# Patient Record
Sex: Female | Born: 1945
Health system: Southern US, Community
[De-identification: ages and names within clinical notes are randomized; demographics above are authoritative.]

## PROBLEM LIST (undated history)

## (undated) DIAGNOSIS — I10 Essential (primary) hypertension: Secondary | ICD-10-CM

## (undated) DIAGNOSIS — K219 Gastro-esophageal reflux disease without esophagitis: Secondary | ICD-10-CM

## (undated) DIAGNOSIS — Z8719 Personal history of other diseases of the digestive system: Secondary | ICD-10-CM

## (undated) DIAGNOSIS — I2510 Atherosclerotic heart disease of native coronary artery without angina pectoris: Secondary | ICD-10-CM

## (undated) DIAGNOSIS — D509 Iron deficiency anemia, unspecified: Principal | ICD-10-CM

## (undated) DIAGNOSIS — M1990 Unspecified osteoarthritis, unspecified site: Secondary | ICD-10-CM

## (undated) DIAGNOSIS — S72001A Fracture of unspecified part of neck of right femur, initial encounter for closed fracture: Secondary | ICD-10-CM

## (undated) DIAGNOSIS — D699 Hemorrhagic condition, unspecified: Secondary | ICD-10-CM

## (undated) DIAGNOSIS — A044 Other intestinal Escherichia coli infections: Secondary | ICD-10-CM

## (undated) DIAGNOSIS — I219 Acute myocardial infarction, unspecified: Secondary | ICD-10-CM

## (undated) DIAGNOSIS — M5414 Radiculopathy, thoracic region: Secondary | ICD-10-CM

## (undated) DIAGNOSIS — S0990XA Unspecified injury of head, initial encounter: Secondary | ICD-10-CM

## (undated) DIAGNOSIS — D649 Anemia, unspecified: Secondary | ICD-10-CM

## (undated) DIAGNOSIS — N2889 Other specified disorders of kidney and ureter: Secondary | ICD-10-CM

## (undated) HISTORY — DX: Iron deficiency anemia, unspecified: D50.9

## (undated) HISTORY — PX: BREAST BIOPSY: SHX20

## (undated) HISTORY — DX: Other specified disorders of kidney and ureter: N28.89

## (undated) HISTORY — PX: TONSILLECTOMY: SUR1361

## (undated) HISTORY — PX: REDUCTION MAMMAPLASTY: SUR839

## (undated) HISTORY — PX: COLONOSCOPY: SHX174

## (undated) HISTORY — DX: Hemorrhagic condition, unspecified: D69.9

## (undated) HISTORY — PX: ABDOMINAL HYSTERECTOMY: SHX81

## (undated) HISTORY — DX: Radiculopathy, thoracic region: M54.14

## (undated) HISTORY — PX: BREAST LUMPECTOMY: SHX2

## (undated) HISTORY — PX: URETHRA SURGERY: SHX824

## (undated) HISTORY — DX: Acute myocardial infarction, unspecified: I21.9

---

## 1979-05-12 HISTORY — PX: BREAST SURGERY: SHX581

## 1979-05-12 HISTORY — PX: BREAST REDUCTION SURGERY: SHX8

## 1998-05-11 HISTORY — PX: FACIAL FRACTURE SURGERY: SHX1570

## 2000-10-28 ENCOUNTER — Encounter: Payer: Self-pay | Admitting: Cardiovascular Disease

## 2000-10-28 ENCOUNTER — Ambulatory Visit (HOSPITAL_COMMUNITY): Admission: RE | Admit: 2000-10-28 | Discharge: 2000-10-28 | Payer: Self-pay | Admitting: Cardiovascular Disease

## 2004-03-21 ENCOUNTER — Ambulatory Visit: Payer: Self-pay | Admitting: Internal Medicine

## 2006-04-21 ENCOUNTER — Emergency Department (HOSPITAL_COMMUNITY): Admission: EM | Admit: 2006-04-21 | Discharge: 2006-04-21 | Payer: Self-pay | Admitting: Emergency Medicine

## 2010-11-23 ENCOUNTER — Emergency Department (HOSPITAL_COMMUNITY)
Admission: EM | Admit: 2010-11-23 | Discharge: 2010-11-23 | Disposition: A | Discharge status: Home / Self Care | Payer: Medicare Other | Attending: Emergency Medicine | Admitting: Emergency Medicine

## 2010-11-23 ENCOUNTER — Emergency Department (HOSPITAL_COMMUNITY): Payer: Medicare Other

## 2010-11-23 DIAGNOSIS — K449 Diaphragmatic hernia without obstruction or gangrene: Secondary | ICD-10-CM | POA: Insufficient documentation

## 2010-11-23 DIAGNOSIS — I10 Essential (primary) hypertension: Secondary | ICD-10-CM | POA: Insufficient documentation

## 2010-11-23 DIAGNOSIS — R42 Dizziness and giddiness: Secondary | ICD-10-CM | POA: Insufficient documentation

## 2010-11-23 LAB — CBC
HCT: 36.3 % (ref 36.0–46.0)
Hemoglobin: 12 g/dL (ref 12.0–15.0)
MCH: 27.3 pg (ref 26.0–34.0)
MCHC: 33.1 g/dL (ref 30.0–36.0)
MCV: 82.5 fL (ref 78.0–100.0)
Platelets: 281 10*3/uL (ref 150–400)
RBC: 4.4 MIL/uL (ref 3.87–5.11)
RDW: 13.7 % (ref 11.5–15.5)
WBC: 8.5 10*3/uL (ref 4.0–10.5)

## 2010-11-23 LAB — CK TOTAL AND CKMB (NOT AT ARMC)
CK, MB: 1.4 ng/mL (ref 0.3–4.0)
CK, MB: 1.2 ng/mL (ref 0.3–4.0)
Relative Index: INVALID (ref 0.0–2.5)
Relative Index: INVALID (ref 0.0–2.5)
Total CK: 40 U/L (ref 7–177)

## 2010-11-23 LAB — COMPREHENSIVE METABOLIC PANEL
ALT: 12 U/L (ref 0–35)
AST: 15 U/L (ref 0–37)
Calcium: 8.8 mg/dL (ref 8.4–10.5)
Creatinine, Ser: 0.52 mg/dL (ref 0.50–1.10)
Sodium: 139 mEq/L (ref 135–145)
Total Protein: 6.8 g/dL (ref 6.0–8.3)

## 2010-11-23 LAB — TROPONIN I
Troponin I: <0.3 ng/mL (ref <0.30)
Troponin I: <0.3 ng/mL (ref <0.30)

## 2011-05-19 ENCOUNTER — Telehealth (INDEPENDENT_AMBULATORY_CARE_PROVIDER_SITE_OTHER): Payer: Self-pay

## 2011-05-19 NOTE — Telephone Encounter (Signed)
error 

## 2011-07-23 DIAGNOSIS — I10 Essential (primary) hypertension: Secondary | ICD-10-CM | POA: Diagnosis not present

## 2011-07-23 DIAGNOSIS — E782 Mixed hyperlipidemia: Secondary | ICD-10-CM | POA: Diagnosis not present

## 2011-07-23 DIAGNOSIS — E119 Type 2 diabetes mellitus without complications: Secondary | ICD-10-CM | POA: Diagnosis not present

## 2011-07-23 DIAGNOSIS — N951 Menopausal and female climacteric states: Secondary | ICD-10-CM | POA: Diagnosis not present

## 2011-07-23 DIAGNOSIS — E039 Hypothyroidism, unspecified: Secondary | ICD-10-CM | POA: Diagnosis not present

## 2011-07-27 DIAGNOSIS — E119 Type 2 diabetes mellitus without complications: Secondary | ICD-10-CM | POA: Diagnosis not present

## 2011-07-27 DIAGNOSIS — D509 Iron deficiency anemia, unspecified: Secondary | ICD-10-CM | POA: Diagnosis not present

## 2011-07-27 DIAGNOSIS — R1032 Left lower quadrant pain: Secondary | ICD-10-CM | POA: Diagnosis not present

## 2011-07-27 DIAGNOSIS — R21 Rash and other nonspecific skin eruption: Secondary | ICD-10-CM | POA: Diagnosis not present

## 2011-08-03 DIAGNOSIS — R1032 Left lower quadrant pain: Secondary | ICD-10-CM | POA: Diagnosis not present

## 2011-08-05 DIAGNOSIS — D509 Iron deficiency anemia, unspecified: Secondary | ICD-10-CM | POA: Diagnosis not present

## 2011-08-05 DIAGNOSIS — R1032 Left lower quadrant pain: Secondary | ICD-10-CM | POA: Diagnosis not present

## 2011-08-05 DIAGNOSIS — R21 Rash and other nonspecific skin eruption: Secondary | ICD-10-CM | POA: Diagnosis not present

## 2011-08-19 DIAGNOSIS — R21 Rash and other nonspecific skin eruption: Secondary | ICD-10-CM | POA: Diagnosis not present

## 2011-08-19 DIAGNOSIS — T07XXXA Unspecified multiple injuries, initial encounter: Secondary | ICD-10-CM | POA: Diagnosis not present

## 2011-08-19 DIAGNOSIS — L2989 Other pruritus: Secondary | ICD-10-CM | POA: Diagnosis not present

## 2011-08-19 DIAGNOSIS — L298 Other pruritus: Secondary | ICD-10-CM | POA: Diagnosis not present

## 2011-08-19 DIAGNOSIS — L259 Unspecified contact dermatitis, unspecified cause: Secondary | ICD-10-CM | POA: Diagnosis not present

## 2011-08-19 DIAGNOSIS — L280 Lichen simplex chronicus: Secondary | ICD-10-CM | POA: Diagnosis not present

## 2011-10-09 DIAGNOSIS — F329 Major depressive disorder, single episode, unspecified: Secondary | ICD-10-CM | POA: Diagnosis not present

## 2011-10-09 DIAGNOSIS — D539 Nutritional anemia, unspecified: Secondary | ICD-10-CM | POA: Diagnosis not present

## 2011-10-09 DIAGNOSIS — D649 Anemia, unspecified: Secondary | ICD-10-CM | POA: Diagnosis not present

## 2011-10-19 ENCOUNTER — Encounter (HOSPITAL_COMMUNITY): Payer: Self-pay | Admitting: *Deleted

## 2011-10-19 ENCOUNTER — Emergency Department (HOSPITAL_COMMUNITY)
Admission: EM | Admit: 2011-10-19 | Discharge: 2011-10-19 | Disposition: A | Discharge status: Home / Self Care | Payer: Medicare Other | Attending: Emergency Medicine | Admitting: Emergency Medicine

## 2011-10-19 DIAGNOSIS — R21 Rash and other nonspecific skin eruption: Secondary | ICD-10-CM | POA: Diagnosis not present

## 2011-10-19 DIAGNOSIS — I2510 Atherosclerotic heart disease of native coronary artery without angina pectoris: Secondary | ICD-10-CM | POA: Insufficient documentation

## 2011-10-19 DIAGNOSIS — Z79899 Other long term (current) drug therapy: Secondary | ICD-10-CM | POA: Diagnosis not present

## 2011-10-19 DIAGNOSIS — D649 Anemia, unspecified: Secondary | ICD-10-CM | POA: Insufficient documentation

## 2011-10-19 DIAGNOSIS — I10 Essential (primary) hypertension: Secondary | ICD-10-CM | POA: Insufficient documentation

## 2011-10-19 DIAGNOSIS — K921 Melena: Secondary | ICD-10-CM

## 2011-10-19 HISTORY — DX: Unspecified injury of head, initial encounter: S09.90XA

## 2011-10-19 HISTORY — DX: Anemia, unspecified: D64.9

## 2011-10-19 HISTORY — DX: Essential (primary) hypertension: I10

## 2011-10-19 HISTORY — DX: Other intestinal Escherichia coli infections: A04.4

## 2011-10-19 HISTORY — DX: Atherosclerotic heart disease of native coronary artery without angina pectoris: I25.10

## 2011-10-19 LAB — DIFFERENTIAL
Basophils Relative: 0 % (ref 0–1)
Eosinophils Absolute: 0.1 10*3/uL (ref 0.0–0.7)
Eosinophils Relative: 2 % (ref 0–5)
Lymphocytes Relative: 34 % (ref 12–46)
Monocytes Absolute: 0.5 10*3/uL (ref 0.1–1.0)
Neutrophils Relative %: 57 % (ref 43–77)

## 2011-10-19 LAB — COMPREHENSIVE METABOLIC PANEL
ALT: 7 U/L (ref 0–35)
AST: 17 U/L (ref 0–37)
Alkaline Phosphatase: 81 U/L (ref 39–117)
Calcium: 8.9 mg/dL (ref 8.4–10.5)
Glucose, Bld: 125 mg/dL — ABNORMAL HIGH (ref 70–99)
Potassium: 3.6 mEq/L (ref 3.5–5.1)
Sodium: 136 mEq/L (ref 135–145)
Total Protein: 6.5 g/dL (ref 6.0–8.3)

## 2011-10-19 LAB — CBC
Hemoglobin: 8.8 g/dL — ABNORMAL LOW (ref 12.0–15.0)
MCH: 21.6 pg — ABNORMAL LOW (ref 26.0–34.0)
MCHC: 29.1 g/dL — ABNORMAL LOW (ref 30.0–36.0)
Platelets: 150 10*3/uL (ref 150–400)
RBC: 4.08 MIL/uL (ref 3.87–5.11)

## 2011-10-19 LAB — TYPE AND SCREEN

## 2011-10-19 LAB — OCCULT BLOOD, POC DEVICE: Fecal Occult Bld: NEGATIVE

## 2011-10-19 LAB — ABO/RH: ABO/RH(D): A POS

## 2011-10-19 MED ORDER — TRIAMCINOLONE ACETONIDE 0.1 % EX CREA: TOPICAL_CREAM | Freq: Two times a day (BID) | CUTANEOUS | Status: AC

## 2011-10-19 NOTE — ED Notes (Signed)
Pt states has a low hgb since April approx 8; down to 6 now; being seen for same; states has no energy; cold all the time; worse today; today at 4 am developed severe stomach cramps; had black "oil" like stool; repeat black stools at least 5 times; sent over by [facility]; dizzy at times; no abd pain now; nausea

## 2011-10-19 NOTE — Discharge Instructions (Signed)
Anemia, Frequently Asked Questions WHAT ARE THE SYMPTOMS OF ANEMIA?  Headache.   Difficulty thinking.   Fatigue.   Shortness of breath.   Weakness.   Rapid heartbeat.  AT WHAT POINT ARE PEOPLE CONSIDERED ANEMIC?  This varies with gender and age.   Both hemoglobin (Hgb) and hematocrit values are used to define anemia. These lab values are obtained from a complete blood count (CBC) test. This is performed at a caregiver's office.   The normal range of hemoglobin values for adult men is 14.0 g/dL to 17.4 g/dL. For nonpregnant women, values are 12.3 g/dL to 15.3 g/dL.   The World Health Organization defines anemia as less than 12 g/dL for nonpregnant women and less than 13 g/dL for men.   For adult males, the average normal hematocrit is 46%, and the range is 40% to 52%.   For adult females, the average normal hematocrit is 41%, and the range is 35% to 47%.   Values that fall below the lower limits can be a sign of anemia and should have further checking (evaluation).  GROUPS OF PEOPLE WHO ARE AT RISK FOR DEVELOPING ANEMIA INCLUDE:   Infants who are breastfed or taking a formula that is not fortified with iron.   Children going through a rapid growth spurt. The iron available can not keep up with the needs for a red cell mass which must grow with the child.   Women in childbearing years. They need iron because of blood loss during menstruation.   Pregnant women. The growing fetus creates a high demand for iron.   People with ongoing gastrointestinal blood loss are at risk of developing iron deficiency.   Individuals with leukemia or cancer who must receive chemotherapy or radiation to treat their disease. The drugs or radiation used to treat these diseases often decreases the bone marrow's ability to make cells of all classes. This includes red blood cells, white blood cells, and platelets.   Individuals with chronic inflammatory conditions such as rheumatoid arthritis or  chronic infections.   The elderly.  ARE SOME TYPES OF ANEMIA INHERITED?   Yes, some types of anemia are due to inherited or genetic defects.   Sickle cell anemia. This occurs most often in people of African, African American, and Mediterranean descent.   Thalassemia (or Cooley's anemia). This type is found in people of Mediterranean and Southeast Asian descent. These types of anemia are common.   [place]i. This is rare.  CAN CERTAIN MEDICATIONS CAUSE A PERSON TO BECOME ANEMIC?  Yes. For example, drugs to fight cancer (chemotherapeutic agents) often cause anemia. These drugs can slow the bone marrow's ability to make red blood cells. If there are not enough red blood cells, the body does not get enough oxygen. WHAT HEMATOCRIT LEVEL IS REQUIRED TO DONATE BLOOD?  The lower limit of an acceptable hematocrit for blood donors is 38%. If you have a low hematocrit value, you should schedule an appointment with your caregiver. ARE BLOOD TRANSFUSIONS COMMONLY USED TO CORRECT ANEMIA, AND ARE THEY DANGEROUS?  They are used to treat anemia as a last resort. Your caregiver will find the cause of the anemia and correct it if possible. Most blood transfusions are given because of excessive bleeding at the time of surgery, with trauma, or because of bone marrow suppression in patients with cancer or leukemia on chemotherapy. Blood transfusions are safer than ever before. We also know that blood transfusions affect the immune system and may increase certain risks. There is   also a concern for human error. In 1/16,000 transfusions, a patient receives a transfusion of blood that is not matched with his or her blood type.  WHAT IS IRON DEFICIENCY ANEMIA AND CAN I CORRECT IT BY CHANGING MY DIET?  Iron is an essential part of hemoglobin. Without enough hemoglobin, anemia develops and the body does not get the right amount of oxygen. Iron deficiency anemia develops after the body has had a low level of iron for a long  time. This is either caused by blood loss, not taking in or absorbing enough iron, or increased demands for iron (like pregnancy or rapid growth).  Foods from animal origin such as beef, chicken, and pork, are good sources of iron. Be sure to have one of these foods at each meal. Vitamin C helps your body absorb iron. Foods rich in Vitamin C include citrus, bell pepper, strawberries, spinach and cantaloupe. In some cases, iron supplements may be needed in order to correct the iron deficiency. In the case of poor absorption, extra iron may have to be given directly into the vein through a needle (intravenously). I HAVE BEEN DIAGNOSED WITH IRON DEFICIENCY ANEMIA AND MY CAREGIVER PRESCRIBED IRON SUPPLEMENTS. HOW LONG WILL IT TAKE FOR MY BLOOD TO BECOME NORMAL?  It depends on the degree of anemia at the beginning of treatment. Most people with mild to moderate iron deficiency, anemia will correct the anemia over a period of 2 to 3 months. But after the anemia is corrected, the iron stored by the body is still low. Caregivers often suggest an additional 6 months of oral iron therapy once the anemia has been reversed. This will help prevent the iron deficiency anemia from quickly happening again. Non-anemic adult males should take iron supplements only under the direction of a doctor, too much iron can cause liver damage.  MY HEMOGLOBIN IS 9 G/DL AND I AM SCHEDULED FOR SURGERY. SHOULD I POSTPONE THE SURGERY?  If you have Hgb of 9, you should discuss this with your caregiver right away. Many patients with similar hemoglobin levels have had surgery without problems. If minimal blood loss is expected for a minor procedure, no treatment may be necessary.  If a greater blood loss is expected for more extensive procedures, you should ask your caregiver about being treated with erythropoietin and iron. This is to accelerate the recovery of your hemoglobin to a normal level before surgery. An anemic patient who undergoes  high-blood-loss surgery has a greater risk of surgical complications and need for a blood transfusion, which also carries some risk.  I HAVE BEEN TOLD THAT HEAVY MENSTRUAL PERIODS CAUSE ANEMIA. IS THERE ANYTHING I CAN DO TO PREVENT THE ANEMIA?  Anemia that results from heavy periods is usually due to iron deficiency. You can try to meet the increased demands for iron caused by the heavy monthly blood loss by increasing the intake of iron-rich foods. Iron supplements may be required. Discuss your concerns with your caregiver. WHAT CAUSES ANEMIA DURING PREGNANCY?  Pregnancy places major demands on the body. The mother must meet the needs of both her body and her growing baby. The body needs enough iron and folate to make the right amount of red blood cells. To prevent anemia while pregnant, the mother should stay in close contact with her caregiver.  Be sure to eat a diet that has foods rich in iron and folate like liver and dark green leafy vegetables. Folate plays an important role in the normal development of a baby's   spinal cord. Folate can help prevent serious disorders like spina bifida. If your diet does not provide adequate nutrients, you may want to talk with your caregiver about nutritional supplements.  WHAT IS THE RELATIONSHIP BETWEEN FIBROID TUMORS AND ANEMIA IN WOMEN?  The relationship is usually caused by the increased menstrual blood loss caused by fibroids. Good iron intake may be required to prevent iron deficiency anemia from developing.  Document Released: 12/04/2003 Document Revised: 04/16/2011 Document Reviewed: 05/20/2010 [facility] Patient Information 2012 [place], [place].  RESOURCE GUIDE  Dental Problems  Patients with Medicaid: [facility] [phone] [address]                                           [phone] [address] Phone:  [phone]                                                   Phone:  [phone]  If unable to pay or  uninsured, contact:  [facility] or [facility]. to become qualified for the adult dental clinic.  Chronic Pain Problems Contact [facility]  [phone] Patients need to be referred by their primary care doctor.  Insufficient Money for Medicine Contact [employer]:  call "211" or [facility] [phone].  No Primary Care Doctor Call [facility]  [phone] Other agencies that provide inexpensive medical care    [facility]  [phone]    [facility]  [phone]    [facility]  [phone]    [facility]  [phone]    [facility]  [phone]    [facility]  [phone]  Psychological Services [facility]  [phone] [facility]  [phone] [facility]   [phone] (emergency services [phone])  Abuse/Neglect [facility] Child Abuse Hotline [phone]) 010-4466 [facility] Child Abuse Hotline [phone] (After Hours)  Emergency Shelter [facility] [phone]  Maternity Homes [facility] [place] [facility] [phone] [facility] [phone]  MRSA Hotline #:   631-044-2829    [facility] Resources  [facility]  [employer]                           [facility] Dept. [address] [place]                     [address]         [address]  [place]                                               Cristobal Goldmann Phone:  [phone]  Phone:  [phone]                   Phone:  [place] Phone:  [place] [phone] (After Hours)

## 2011-10-19 NOTE — ED Provider Notes (Addendum)
History     CSN: [id number]  Arrival date & time 10/19/11  2003   First MD Initiated Contact with Patient 10/19/11 2043      Chief Complaint  Patient presents with  . Melena    (Consider location/radiation/quality/duration/timing/severity/associated sxs/prior treatment) HPI  66yoF history of anemia of unclear etiology presents with abdominal pain and black stools. The patient states that she woke up at approximately 4 AM this morning and had a large black and orally bowel movement. She states that she had mild upper abdominal cramping at that time. +Nausea, resolved. She's had no cramping since but has had 4 additional black bowel movements throughout the day. She's had intermittent lightheadedness, none currently. SHe does complain of fatigue. No new shortness of breath, chest pain. [facility] and called, was referred to the emergency department for further workup and evaluation. She takes aspirin daily. Last colonoscopy was 11 years ago. No history of GI bleeding in the past. Denies new foods. She takes iron supplements for several days- since 5/31. Last outpatient hgb 8.2    ED Notes, ED Provider Notes from 10/19/11 0000 to 10/19/11 20:29:22       Tammy Clois Comber, RN 10/19/2011 20:25      Pt states has a low hgb since April approx 8; down to 6 now; being seen for same; states has no energy; cold all the time; worse today; today at 4 am developed severe stomach cramps; had black "oil" like stool; repeat black stools at least 5 times; sent over by [facility]; dizzy at times; no abd pain now; nausea    Past Medical History  Diagnosis Date  . Anemia   . Coronary artery disease   . Hypertension   . E coli enteritis   . Head injury     Past Surgical History  Procedure Date  . Abdominal hysterectomy   . Tonsillectomy   . Breast surgery   . Facial fracture surgery     No family history on file.  History  Substance Use Topics  . Smoking  status: Never Smoker   . Smokeless tobacco: Not on file  . Alcohol Use:      social    OB History    Grav Para Term Preterm Abortions TAB SAB Ect Mult Living                  Review of Systems  All other systems reviewed and are negative.  except as noted HPI   Allergies  Demerol and Sulfa antibiotics  Home Medications   Current Outpatient Rx  Name Route Sig Dispense Refill  . VITAMIN C 1000 MG PO TABS Oral Take 1,000 mg by mouth daily.    . ASPIRIN 81 MG PO CHEW Oral Chew 81 mg by mouth daily.    Marland Kitchen CARVEDILOL 6.25 MG PO TABS Oral Take 6.25 mg by mouth 2 (two) times daily with a meal.    . ENALAPRIL MALEATE 20 MG PO TABS Oral Take 20 mg by mouth daily.    Marland Kitchen ESTROGENS CONJUGATED 0.625 MG PO TABS Oral Take 0.625 mg by mouth every other day. Take daily for 21 days then do not take for 7 days.    Di Kindle SULFATE 325 (65 FE) MG PO TABS Oral Take 650 mg by mouth daily with breakfast.    . LEVOTHYROXINE SODIUM 88 MCG PO TABS Oral Take 88 mcg by mouth daily.    Marland Kitchen OMEPRAZOLE 20 MG  PO CPDR Oral Take 20 mg by mouth daily.    Marland Kitchen ROSUVASTATIN CALCIUM 10 MG PO TABS Oral Take 10 mg by mouth daily.    . VENLAFAXINE HCL ER 150 MG PO CP24 Oral Take 200 mg by mouth daily.    Marland Kitchen VITAMIN B-12 1000 MCG PO TABS Oral Take 1,000 mcg by mouth daily.    . TRIAMCINOLONE ACETONIDE 0.1 % EX CREA Topical Apply topically 2 (two) times daily. 30 g 0    BP 149/80  Pulse 78  Temp(Src) 97.8 F (36.6 C) (Oral)  Resp 20  SpO2 97%  Physical Exam  Nursing note and vitals reviewed. Constitutional: She is oriented to person, place, and time. She appears well-developed.  HENT:  Head: Atraumatic.  Mouth/Throat: Oropharynx is clear and moist.  Eyes: Conjunctivae and EOM are normal. Pupils are equal, round, and reactive to light.  Neck: Normal range of motion. Neck supple.  Cardiovascular: Normal rate, regular rhythm, normal heart sounds and intact distal pulses.   Pulmonary/Chest: Effort normal and breath  sounds normal. No respiratory distress. She has no wheezes. She has no rales.  Abdominal: Soft. She exhibits no distension. There is no tenderness. There is no rebound and no guarding.  Genitourinary:       Black and tarry stool  Musculoskeletal: Normal range of motion.  Neurological: She is alert and oriented to person, place, and time.  Skin: Skin is warm and dry. No rash noted.       Scattered b/l UE erythematous lesions, excoriations  Psychiatric: She has a normal mood and affect.    ED Course  Procedures (including critical care time)  Labs Reviewed  CBC - Abnormal; Notable for the following:    Hemoglobin 8.8 (*)    HCT 30.2 (*)    MCV 74.0 (*)    MCH 21.6 (*)    MCHC 29.1 (*)    RDW 16.4 (*)    All other components within normal limits  COMPREHENSIVE METABOLIC PANEL - Abnormal; Notable for the following:    Glucose, Bld 125 (*)    Albumin 3.2 (*)    Total Bilirubin 0.2 (*)    All other components within normal limits  DIFFERENTIAL  TYPE AND SCREEN  OCCULT BLOOD, POC DEVICE  ABO/RH  OCCULT BLOOD X 1 CARD TO LAB, STOOL   No results found.   1. Black stool   2. Anemia   3. Rash     MDM  Patient presents with black tarry stools. She does have a history of iron deficient anemia of unknown cause in the past. Her last hemoglobin palpation was 8.2. Her hemoglobin is 8.8 today. Her stool although black is negative for blood. I repeated the exam myself and confirmed that she is hemeoccult negative. This may be related to recent iron supplementation which began 10 days ago. The patient is stable and will be discharged home to follow up with [facility] as outpatient. She's been given strict precautions for return.  Asking for sx care for b/l UE rash which may be associated with poison ivy dermatitis. Topical steroid prescribed. PMD f/u        Forbes Cellar, MD 10/19/11 2257  Forbes Cellar, MD 10/19/11 [phone]

## 2011-10-19 NOTE — ED Notes (Signed)
Pt reports approx 0400 this a.m. Pt awoke w/ upper abd cramping - pt had stool that was black and runny that pt describes as "black oil" - pt w/ hx of low hgb in April while in [facility] - saw Dr. Tenny Craw on 10/09/11 w/ CBC that showed hgb 8.5 and was prescribed iron regimen at that time. Pt admits to x5 more episodes today of black tarry stools, denies nausea or abd pain at present. Pt in no acute distress, A&Px4, family at bedside x1.

## 2011-10-19 NOTE — ED Notes (Signed)
Rx given x1 D/c instructions reviewed w/ pt and family - pt and family deny any further questions or concerns at present. Pt ambulating independently w/ steady gait on d/c in no acute distress, A&Ox4.  

## 2011-10-26 DIAGNOSIS — D509 Iron deficiency anemia, unspecified: Secondary | ICD-10-CM | POA: Diagnosis not present

## 2011-10-26 DIAGNOSIS — R1084 Generalized abdominal pain: Secondary | ICD-10-CM | POA: Diagnosis not present

## 2011-10-26 DIAGNOSIS — L259 Unspecified contact dermatitis, unspecified cause: Secondary | ICD-10-CM | POA: Diagnosis not present

## 2011-11-09 DIAGNOSIS — D649 Anemia, unspecified: Secondary | ICD-10-CM | POA: Diagnosis not present

## 2011-11-09 DIAGNOSIS — D509 Iron deficiency anemia, unspecified: Secondary | ICD-10-CM | POA: Diagnosis not present

## 2011-11-23 DIAGNOSIS — I10 Essential (primary) hypertension: Secondary | ICD-10-CM | POA: Diagnosis not present

## 2011-11-23 DIAGNOSIS — E782 Mixed hyperlipidemia: Secondary | ICD-10-CM | POA: Diagnosis not present

## 2011-12-01 DIAGNOSIS — H1045 Other chronic allergic conjunctivitis: Secondary | ICD-10-CM | POA: Diagnosis not present

## 2011-12-01 DIAGNOSIS — H04129 Dry eye syndrome of unspecified lacrimal gland: Secondary | ICD-10-CM | POA: Diagnosis not present

## 2011-12-01 DIAGNOSIS — H2510 Age-related nuclear cataract, unspecified eye: Secondary | ICD-10-CM | POA: Diagnosis not present

## 2011-12-18 DIAGNOSIS — D1330 Benign neoplasm of unspecified part of small intestine: Secondary | ICD-10-CM | POA: Diagnosis not present

## 2011-12-18 DIAGNOSIS — D509 Iron deficiency anemia, unspecified: Secondary | ICD-10-CM | POA: Diagnosis not present

## 2011-12-18 DIAGNOSIS — R198 Other specified symptoms and signs involving the digestive system and abdomen: Secondary | ICD-10-CM | POA: Diagnosis not present

## 2011-12-18 DIAGNOSIS — R1013 Epigastric pain: Secondary | ICD-10-CM | POA: Diagnosis not present

## 2011-12-18 DIAGNOSIS — K5730 Diverticulosis of large intestine without perforation or abscess without bleeding: Secondary | ICD-10-CM | POA: Diagnosis not present

## 2011-12-18 DIAGNOSIS — K449 Diaphragmatic hernia without obstruction or gangrene: Secondary | ICD-10-CM | POA: Diagnosis not present

## 2012-02-09 DIAGNOSIS — D509 Iron deficiency anemia, unspecified: Secondary | ICD-10-CM | POA: Diagnosis not present

## 2012-02-09 DIAGNOSIS — J45909 Unspecified asthma, uncomplicated: Secondary | ICD-10-CM | POA: Diagnosis not present

## 2012-02-09 DIAGNOSIS — Z23 Encounter for immunization: Secondary | ICD-10-CM | POA: Diagnosis not present

## 2012-02-09 DIAGNOSIS — D649 Anemia, unspecified: Secondary | ICD-10-CM | POA: Diagnosis not present

## 2012-02-09 DIAGNOSIS — M25549 Pain in joints of unspecified hand: Secondary | ICD-10-CM | POA: Diagnosis not present

## 2012-02-09 DIAGNOSIS — F329 Major depressive disorder, single episode, unspecified: Secondary | ICD-10-CM | POA: Diagnosis not present

## 2012-02-09 DIAGNOSIS — IMO0001 Reserved for inherently not codable concepts without codable children: Secondary | ICD-10-CM | POA: Diagnosis not present

## 2012-02-09 DIAGNOSIS — I10 Essential (primary) hypertension: Secondary | ICD-10-CM | POA: Diagnosis not present

## 2012-02-09 DIAGNOSIS — E039 Hypothyroidism, unspecified: Secondary | ICD-10-CM | POA: Diagnosis not present

## 2012-02-09 DIAGNOSIS — E785 Hyperlipidemia, unspecified: Secondary | ICD-10-CM | POA: Diagnosis not present

## 2012-02-25 DIAGNOSIS — Z1211 Encounter for screening for malignant neoplasm of colon: Secondary | ICD-10-CM | POA: Diagnosis not present

## 2012-02-29 DIAGNOSIS — R143 Flatulence: Secondary | ICD-10-CM | POA: Diagnosis not present

## 2012-02-29 DIAGNOSIS — D649 Anemia, unspecified: Secondary | ICD-10-CM | POA: Diagnosis not present

## 2012-02-29 DIAGNOSIS — R141 Gas pain: Secondary | ICD-10-CM | POA: Diagnosis not present

## 2012-02-29 DIAGNOSIS — R197 Diarrhea, unspecified: Secondary | ICD-10-CM | POA: Diagnosis not present

## 2012-03-16 DIAGNOSIS — R143 Flatulence: Secondary | ICD-10-CM | POA: Diagnosis not present

## 2012-03-16 DIAGNOSIS — R141 Gas pain: Secondary | ICD-10-CM | POA: Diagnosis not present

## 2012-03-16 DIAGNOSIS — E785 Hyperlipidemia, unspecified: Secondary | ICD-10-CM | POA: Diagnosis not present

## 2012-03-16 DIAGNOSIS — I10 Essential (primary) hypertension: Secondary | ICD-10-CM | POA: Diagnosis not present

## 2012-03-16 DIAGNOSIS — M25549 Pain in joints of unspecified hand: Secondary | ICD-10-CM | POA: Diagnosis not present

## 2012-03-16 DIAGNOSIS — J45909 Unspecified asthma, uncomplicated: Secondary | ICD-10-CM | POA: Diagnosis not present

## 2012-03-16 DIAGNOSIS — D649 Anemia, unspecified: Secondary | ICD-10-CM | POA: Diagnosis not present

## 2012-03-16 DIAGNOSIS — E039 Hypothyroidism, unspecified: Secondary | ICD-10-CM | POA: Diagnosis not present

## 2012-03-16 DIAGNOSIS — F329 Major depressive disorder, single episode, unspecified: Secondary | ICD-10-CM | POA: Diagnosis not present

## 2012-04-22 DIAGNOSIS — Z1331 Encounter for screening for depression: Secondary | ICD-10-CM | POA: Diagnosis not present

## 2012-04-22 DIAGNOSIS — E039 Hypothyroidism, unspecified: Secondary | ICD-10-CM | POA: Diagnosis not present

## 2012-04-22 DIAGNOSIS — D649 Anemia, unspecified: Secondary | ICD-10-CM | POA: Diagnosis not present

## 2012-04-22 DIAGNOSIS — E785 Hyperlipidemia, unspecified: Secondary | ICD-10-CM | POA: Diagnosis not present

## 2012-04-22 DIAGNOSIS — R141 Gas pain: Secondary | ICD-10-CM | POA: Diagnosis not present

## 2012-04-22 DIAGNOSIS — R159 Full incontinence of feces: Secondary | ICD-10-CM | POA: Diagnosis not present

## 2012-04-22 DIAGNOSIS — R142 Eructation: Secondary | ICD-10-CM | POA: Diagnosis not present

## 2012-04-22 DIAGNOSIS — I10 Essential (primary) hypertension: Secondary | ICD-10-CM | POA: Diagnosis not present

## 2012-04-22 DIAGNOSIS — F329 Major depressive disorder, single episode, unspecified: Secondary | ICD-10-CM | POA: Diagnosis not present

## 2012-04-29 DIAGNOSIS — R197 Diarrhea, unspecified: Secondary | ICD-10-CM | POA: Diagnosis not present

## 2012-05-26 DIAGNOSIS — R5381 Other malaise: Secondary | ICD-10-CM | POA: Diagnosis not present

## 2012-05-26 DIAGNOSIS — E039 Hypothyroidism, unspecified: Secondary | ICD-10-CM | POA: Diagnosis not present

## 2012-05-26 DIAGNOSIS — R5383 Other fatigue: Secondary | ICD-10-CM | POA: Diagnosis not present

## 2012-06-03 DIAGNOSIS — R300 Dysuria: Secondary | ICD-10-CM | POA: Diagnosis not present

## 2012-06-14 DIAGNOSIS — D509 Iron deficiency anemia, unspecified: Secondary | ICD-10-CM | POA: Diagnosis not present

## 2012-07-07 DIAGNOSIS — L259 Unspecified contact dermatitis, unspecified cause: Secondary | ICD-10-CM | POA: Diagnosis not present

## 2012-08-02 ENCOUNTER — Ambulatory Visit: Payer: No Typology Code available for payment source

## 2012-08-02 ENCOUNTER — Ambulatory Visit
Admission: RE | Admit: 2012-08-02 | Discharge: 2012-08-02 | Disposition: A | Discharge status: Home / Self Care | Payer: No Typology Code available for payment source | Source: Ambulatory Visit | Attending: Internal Medicine | Admitting: Internal Medicine

## 2012-08-02 ENCOUNTER — Other Ambulatory Visit: Payer: Self-pay | Admitting: Internal Medicine

## 2012-08-02 DIAGNOSIS — L738 Other specified follicular disorders: Secondary | ICD-10-CM | POA: Diagnosis not present

## 2012-08-02 DIAGNOSIS — R159 Full incontinence of feces: Secondary | ICD-10-CM | POA: Diagnosis not present

## 2012-08-02 DIAGNOSIS — Z1231 Encounter for screening mammogram for malignant neoplasm of breast: Secondary | ICD-10-CM

## 2012-08-02 DIAGNOSIS — D649 Anemia, unspecified: Secondary | ICD-10-CM | POA: Diagnosis not present

## 2012-08-02 DIAGNOSIS — N390 Urinary tract infection, site not specified: Secondary | ICD-10-CM | POA: Diagnosis not present

## 2012-08-02 DIAGNOSIS — G43909 Migraine, unspecified, not intractable, without status migrainosus: Secondary | ICD-10-CM | POA: Diagnosis not present

## 2012-08-10 ENCOUNTER — Other Ambulatory Visit: Payer: Self-pay | Admitting: Internal Medicine

## 2012-08-10 ENCOUNTER — Other Ambulatory Visit (HOSPITAL_COMMUNITY): Payer: Self-pay | Admitting: *Deleted

## 2012-08-11 ENCOUNTER — Other Ambulatory Visit (HOSPITAL_COMMUNITY): Payer: Self-pay | Admitting: *Deleted

## 2012-08-12 ENCOUNTER — Ambulatory Visit (HOSPITAL_COMMUNITY)
Admission: RE | Admit: 2012-08-12 | Discharge: 2012-08-12 | Disposition: A | Discharge status: Home / Self Care | Payer: Medicare Other | Source: Ambulatory Visit | Attending: Internal Medicine | Admitting: Internal Medicine

## 2012-08-12 DIAGNOSIS — I2510 Atherosclerotic heart disease of native coronary artery without angina pectoris: Secondary | ICD-10-CM | POA: Insufficient documentation

## 2012-08-12 DIAGNOSIS — D649 Anemia, unspecified: Secondary | ICD-10-CM | POA: Insufficient documentation

## 2012-08-12 DIAGNOSIS — I10 Essential (primary) hypertension: Secondary | ICD-10-CM | POA: Insufficient documentation

## 2012-08-12 MED ORDER — FERUMOXYTOL INJECTION 510 MG/17 ML
INTRAVENOUS | Status: AC
  Filled 2012-08-12: qty 17

## 2012-08-12 MED ORDER — FERUMOXYTOL INJECTION 510 MG/17 ML
510.0000 mg | Freq: Once | INTRAVENOUS | Status: AC
  Administered 2012-08-12: 510 mg via INTRAVENOUS

## 2012-08-12 MED ORDER — SODIUM CHLORIDE 0.9 % IV SOLN
INTRAVENOUS | Status: DC
  Administered 2012-08-12: 08:00:00 via INTRAVENOUS

## 2012-08-22 DIAGNOSIS — D649 Anemia, unspecified: Secondary | ICD-10-CM | POA: Diagnosis not present

## 2012-08-22 DIAGNOSIS — R5381 Other malaise: Secondary | ICD-10-CM | POA: Diagnosis not present

## 2012-08-22 DIAGNOSIS — R5383 Other fatigue: Secondary | ICD-10-CM | POA: Diagnosis not present

## 2012-09-05 DIAGNOSIS — D649 Anemia, unspecified: Secondary | ICD-10-CM | POA: Diagnosis not present

## 2012-09-19 ENCOUNTER — Other Ambulatory Visit: Payer: Self-pay | Admitting: Gastroenterology

## 2012-09-19 DIAGNOSIS — R1032 Left lower quadrant pain: Secondary | ICD-10-CM | POA: Diagnosis not present

## 2012-09-19 DIAGNOSIS — D649 Anemia, unspecified: Secondary | ICD-10-CM | POA: Diagnosis not present

## 2012-09-19 DIAGNOSIS — R142 Eructation: Secondary | ICD-10-CM | POA: Diagnosis not present

## 2012-09-19 DIAGNOSIS — R159 Full incontinence of feces: Secondary | ICD-10-CM | POA: Diagnosis not present

## 2012-09-19 DIAGNOSIS — R197 Diarrhea, unspecified: Secondary | ICD-10-CM | POA: Diagnosis not present

## 2012-09-19 DIAGNOSIS — R141 Gas pain: Secondary | ICD-10-CM | POA: Diagnosis not present

## 2012-09-21 ENCOUNTER — Ambulatory Visit
Admission: RE | Admit: 2012-09-21 | Discharge: 2012-09-21 | Disposition: A | Discharge status: Home / Self Care | Payer: Medicare Other | Source: Ambulatory Visit | Attending: Gastroenterology | Admitting: Gastroenterology

## 2012-09-21 DIAGNOSIS — K5730 Diverticulosis of large intestine without perforation or abscess without bleeding: Secondary | ICD-10-CM | POA: Diagnosis not present

## 2012-09-21 DIAGNOSIS — K449 Diaphragmatic hernia without obstruction or gangrene: Secondary | ICD-10-CM | POA: Diagnosis not present

## 2012-09-21 DIAGNOSIS — R1032 Left lower quadrant pain: Secondary | ICD-10-CM

## 2012-09-21 MED ORDER — IOHEXOL 300 MG/ML  SOLN
100.0000 mL | Freq: Once | INTRAMUSCULAR | Status: AC | PRN
  Administered 2012-09-21: 100 mL via INTRAVENOUS

## 2012-09-23 DIAGNOSIS — R1032 Left lower quadrant pain: Secondary | ICD-10-CM | POA: Diagnosis not present

## 2012-09-29 DIAGNOSIS — R609 Edema, unspecified: Secondary | ICD-10-CM | POA: Diagnosis not present

## 2012-10-13 DIAGNOSIS — Z0000 Encounter for general adult medical examination without abnormal findings: Secondary | ICD-10-CM | POA: Diagnosis not present

## 2012-10-13 DIAGNOSIS — E785 Hyperlipidemia, unspecified: Secondary | ICD-10-CM | POA: Diagnosis not present

## 2012-10-13 DIAGNOSIS — R609 Edema, unspecified: Secondary | ICD-10-CM | POA: Diagnosis not present

## 2012-10-13 DIAGNOSIS — Z79899 Other long term (current) drug therapy: Secondary | ICD-10-CM | POA: Diagnosis not present

## 2012-10-13 DIAGNOSIS — F329 Major depressive disorder, single episode, unspecified: Secondary | ICD-10-CM | POA: Diagnosis not present

## 2012-10-13 DIAGNOSIS — I10 Essential (primary) hypertension: Secondary | ICD-10-CM | POA: Diagnosis not present

## 2012-10-13 DIAGNOSIS — Z1331 Encounter for screening for depression: Secondary | ICD-10-CM | POA: Diagnosis not present

## 2012-10-13 DIAGNOSIS — D649 Anemia, unspecified: Secondary | ICD-10-CM | POA: Diagnosis not present

## 2012-10-13 DIAGNOSIS — E039 Hypothyroidism, unspecified: Secondary | ICD-10-CM | POA: Diagnosis not present

## 2012-11-17 DIAGNOSIS — H0230 Blepharochalasis unspecified eye, unspecified eyelid: Secondary | ICD-10-CM | POA: Diagnosis not present

## 2012-11-17 DIAGNOSIS — H1045 Other chronic allergic conjunctivitis: Secondary | ICD-10-CM | POA: Diagnosis not present

## 2012-11-17 DIAGNOSIS — H2510 Age-related nuclear cataract, unspecified eye: Secondary | ICD-10-CM | POA: Diagnosis not present

## 2012-11-17 DIAGNOSIS — H04129 Dry eye syndrome of unspecified lacrimal gland: Secondary | ICD-10-CM | POA: Diagnosis not present

## 2012-11-23 DIAGNOSIS — I10 Essential (primary) hypertension: Secondary | ICD-10-CM | POA: Diagnosis not present

## 2012-11-23 DIAGNOSIS — R197 Diarrhea, unspecified: Secondary | ICD-10-CM | POA: Diagnosis not present

## 2012-11-23 DIAGNOSIS — F329 Major depressive disorder, single episode, unspecified: Secondary | ICD-10-CM | POA: Diagnosis not present

## 2012-11-23 DIAGNOSIS — R609 Edema, unspecified: Secondary | ICD-10-CM | POA: Diagnosis not present

## 2012-11-23 DIAGNOSIS — D649 Anemia, unspecified: Secondary | ICD-10-CM | POA: Diagnosis not present

## 2012-11-23 DIAGNOSIS — D509 Iron deficiency anemia, unspecified: Secondary | ICD-10-CM | POA: Diagnosis not present

## 2012-11-23 DIAGNOSIS — M76899 Other specified enthesopathies of unspecified lower limb, excluding foot: Secondary | ICD-10-CM | POA: Diagnosis not present

## 2012-11-23 DIAGNOSIS — R159 Full incontinence of feces: Secondary | ICD-10-CM | POA: Diagnosis not present

## 2013-01-13 DIAGNOSIS — R5381 Other malaise: Secondary | ICD-10-CM | POA: Diagnosis not present

## 2013-01-13 DIAGNOSIS — D509 Iron deficiency anemia, unspecified: Secondary | ICD-10-CM | POA: Diagnosis not present

## 2013-01-13 DIAGNOSIS — R5383 Other fatigue: Secondary | ICD-10-CM | POA: Diagnosis not present

## 2013-01-25 DIAGNOSIS — M79609 Pain in unspecified limb: Secondary | ICD-10-CM | POA: Diagnosis not present

## 2013-01-25 DIAGNOSIS — I10 Essential (primary) hypertension: Secondary | ICD-10-CM | POA: Diagnosis not present

## 2013-01-25 DIAGNOSIS — D649 Anemia, unspecified: Secondary | ICD-10-CM | POA: Diagnosis not present

## 2013-01-25 DIAGNOSIS — D509 Iron deficiency anemia, unspecified: Secondary | ICD-10-CM | POA: Diagnosis not present

## 2013-01-25 DIAGNOSIS — R350 Frequency of micturition: Secondary | ICD-10-CM | POA: Diagnosis not present

## 2013-01-25 DIAGNOSIS — J45909 Unspecified asthma, uncomplicated: Secondary | ICD-10-CM | POA: Diagnosis not present

## 2013-01-25 DIAGNOSIS — R7309 Other abnormal glucose: Secondary | ICD-10-CM | POA: Diagnosis not present

## 2013-01-25 DIAGNOSIS — M25549 Pain in joints of unspecified hand: Secondary | ICD-10-CM | POA: Diagnosis not present

## 2013-02-07 DIAGNOSIS — Z23 Encounter for immunization: Secondary | ICD-10-CM | POA: Diagnosis not present

## 2013-02-08 DIAGNOSIS — D509 Iron deficiency anemia, unspecified: Secondary | ICD-10-CM | POA: Diagnosis not present

## 2013-02-13 DIAGNOSIS — D509 Iron deficiency anemia, unspecified: Secondary | ICD-10-CM | POA: Diagnosis not present

## 2013-02-13 DIAGNOSIS — D649 Anemia, unspecified: Secondary | ICD-10-CM | POA: Diagnosis not present

## 2013-02-16 ENCOUNTER — Telehealth: Payer: Self-pay | Admitting: Oncology

## 2013-02-16 NOTE — Telephone Encounter (Signed)
C/D 02/16/13 for appt 03/08/13

## 2013-02-16 NOTE — Telephone Encounter (Signed)
S/W PT AND GVE NP APPT 10/29 @ 11 W/DR. GRANFORTUNA.  REFERRING DR. Molly Maduro GATES DX- IDA/CONCERNS ABOUT Alen Bleacher DISEASE WELCOME PACKET MAILED.

## 2013-02-22 ENCOUNTER — Other Ambulatory Visit: Payer: Self-pay | Admitting: Oncology

## 2013-02-22 ENCOUNTER — Encounter: Payer: Self-pay | Admitting: Oncology

## 2013-02-22 DIAGNOSIS — D509 Iron deficiency anemia, unspecified: Secondary | ICD-10-CM

## 2013-02-22 DIAGNOSIS — D699 Hemorrhagic condition, unspecified: Secondary | ICD-10-CM | POA: Insufficient documentation

## 2013-02-22 HISTORY — DX: Iron deficiency anemia, unspecified: D50.9

## 2013-02-22 HISTORY — DX: Hemorrhagic condition, unspecified: D69.9

## 2013-03-08 ENCOUNTER — Telehealth: Payer: Self-pay | Admitting: *Deleted

## 2013-03-08 ENCOUNTER — Ambulatory Visit (HOSPITAL_BASED_OUTPATIENT_CLINIC_OR_DEPARTMENT_OTHER): Payer: Medicare Other | Admitting: Lab

## 2013-03-08 ENCOUNTER — Encounter: Payer: Self-pay | Admitting: Oncology

## 2013-03-08 ENCOUNTER — Ambulatory Visit: Payer: Medicare Other

## 2013-03-08 ENCOUNTER — Ambulatory Visit (HOSPITAL_BASED_OUTPATIENT_CLINIC_OR_DEPARTMENT_OTHER): Payer: Medicare Other | Admitting: Oncology

## 2013-03-08 VITALS — BP 115/71 | HR 73 | Temp 97.4°F | Resp 18 | Ht 65.0 in | Wt 173.9 lb

## 2013-03-08 DIAGNOSIS — D699 Hemorrhagic condition, unspecified: Secondary | ICD-10-CM

## 2013-03-08 DIAGNOSIS — K9089 Other intestinal malabsorption: Secondary | ICD-10-CM | POA: Diagnosis not present

## 2013-03-08 DIAGNOSIS — D508 Other iron deficiency anemias: Secondary | ICD-10-CM

## 2013-03-08 DIAGNOSIS — E039 Hypothyroidism, unspecified: Secondary | ICD-10-CM | POA: Diagnosis not present

## 2013-03-08 DIAGNOSIS — D509 Iron deficiency anemia, unspecified: Secondary | ICD-10-CM | POA: Diagnosis not present

## 2013-03-08 LAB — CBC & DIFF AND RETIC
Basophils Absolute: 0 10*3/uL (ref 0.0–0.1)
EOS%: 1.7 % (ref 0.0–7.0)
Eosinophils Absolute: 0.1 10*3/uL (ref 0.0–0.5)
HCT: 33.6 % — ABNORMAL LOW (ref 34.8–46.6)
HGB: 10.6 g/dL — ABNORMAL LOW (ref 11.6–15.9)
LYMPH%: 35.2 % (ref 14.0–49.7)
MCH: 25.1 pg (ref 25.1–34.0)
MCV: 79.4 fL — ABNORMAL LOW (ref 79.5–101.0)
MONO#: 0.5 10*3/uL (ref 0.1–0.9)
MONO%: 7.6 % (ref 0.0–14.0)
NEUT#: 3.6 10*3/uL (ref 1.5–6.5)
NEUT%: 55 % (ref 38.4–76.8)
Platelets: 290 10*3/uL (ref 145–400)
Retic Ct Abs: 102.79 10*3/uL — ABNORMAL HIGH (ref 33.70–90.70)

## 2013-03-08 LAB — IRON AND TIBC CHCC
%SAT: 8 % — ABNORMAL LOW (ref 21–57)
Iron: 36 ug/dL — ABNORMAL LOW (ref 41–142)
TIBC: 479 ug/dL — ABNORMAL HIGH (ref 236–444)
UIBC: 443 ug/dL — ABNORMAL HIGH (ref 120–384)

## 2013-03-08 LAB — MORPHOLOGY: PLT EST: ADEQUATE

## 2013-03-08 LAB — FERRITIN CHCC: Ferritin: 7 ng/ml — ABNORMAL LOW (ref 9–269)

## 2013-03-08 NOTE — Telephone Encounter (Signed)
appts made and printed...td 

## 2013-03-08 NOTE — Progress Notes (Signed)
New Patient Hematology-Oncology Evaluation   Caitlyn [name] [id number] Nov 18, 1945 67 y.o. 03/08/2013  CC: Dr. Candyce Churn   Reason for referral: Evaluate iron malabsorption anemia                                     Evaluate for possible von Willebrand's disorder   HPI:  New patient evaluation for this pleasant 67 year old woman who states that she has been chronically anemic but this got worse over the last 2 years. She was living in [place]. Just before she moved to [place] 2 years ago she was told there was a sudden dramatic fall in her hemoglobin from 15 down to 7. She elected to wait until she got here for further evaluation. She underwent upper endoscopy and colonoscopy by Dr. Carman Ching and was told that these studies were unremarkable. She has no history of ulcer disease but she does have reflux and a moderate sized hiatus hernia. She had a CT scan of the abdomen and pelvis for chronic left lower quadrant abdominal pain on 09/21/2012. There was no splenomegaly or lymphadenopathy. A 2.1 x 1.5 cm solid lesion seen medial aspect of the right kidney. She denies any hematuria. She has had a previous hysterectomy at age 64 for severe endometriosis. She had significant menorrhagia which was one of the precipitating reasons to do the hysterectomy. Prior to the hysterectomy her menstrual cycles had always been heavy.  She had wisdom teeth extracted about 44 years ago and does remember some excessive bleeding after one of them was removed. She has had subsequent surgery including breast reduction and a "tummy tuck" without excessive postoperative bleeding. She gets occasional epistaxis but relates this to her chronic asthma and recurrent sinusitis and hayfever symptoms. She has had asthma and multiple allergies since childhood. She tells me she has had pneumonia 20 times. She is very sensitive to medications and preservatives and has had anaphylactic reactions to sulfa, morphine  derivatives, and allergic reactions to steroid preparations likely due to the ones that contain certain preservatives. She is hypothyroid on replacement for about 20 years. She has history of mitral valve prolapse. She had cardiac catheterization by Dr. York Ram about 10 years ago and was told this was normal. At the time of her hysterectomy at age 59 other than endometriosis, she was found to have Escherichia coli infection and what sounds like a teratoma. A mass was removed and she was told that it had hair and teeth in it.  She has been unable to tolerate iron preparations and has tried many of them. They cause significant diarrhea. She was given a dose of parenteral iron as an outpatient back in January of this year and told that her hemoglobin came up to 11 g. Unfortunately data sent from her referring physician's office did not include any previous CBCs or iron studies so I have no comparison values.  There is no family history of anemia or significant bleeding disorder except perhaps for a paternal niece who is now 75 years old. It sounds like this niece has some kind of a complicated problem. She was hospitalized for a month at [facility]. She had some kind of internal bleeding involving her liver. Details not available. The patient's father died at age 34 from an MI, mother lived until old age also had mitral valve prolapse. She has one brother who has diabetes. Age 15. Sister age 63  who is healthy. She has one daughter age 8 who is healthy. No first-degree relatives with any bleeding problems.  PMH: Past Medical History  Diagnosis Date  . Anemia   . Coronary artery disease   . Hypertension   . E coli enteritis   . Head injury   . Iron deficiency anemia, unspecified 02/22/2013  . Bleeding diathesis 02/22/2013    Past Surgical History  Procedure Laterality Date  . Abdominal hysterectomy    . Tonsillectomy    . Breast surgery    . Facial fracture surgery       Allergies: Allergies  Allergen Reactions  . Cortisone Anaphylaxis    Some forms of cortisone  . Demerol [Meperidine]   . Morphine And Related Other (See Comments)    Vomiting, shaking, & elevates BP  . Sulfa Antibiotics     Medications: Aspirin 81 mg daily, vitamin C 1000 mg daily, Coreg 6.25 mg twice a day, Celexa 20 mg daily, Benadryl 25 mg one to 2 at bedtime when necessary sleep, Vasotec 20 mg daily, Levsin 0.125 mg one-2 every 6 hours when necessary cramping or loose stools, Synthroid 88 mcg daily, Naprosyn 220 mg twice a day, Prilosec 20 mg daily, Crestor 10 mg daily, B12 1000 mcg by mouth daily.    Social History:  She is an Tree surgeon. Divorced. Currently engaged to be married. Daughter 28 who is healthy. She is a never smoker. Occasional glass of wine or cocktail.  Family History: See above  Review of Systems: Hematology: negative for swollen glands, easy bruising,, gum bleeding, hematuria, hematochezia ENT ROS: negative for - oral lesions or sore throat Breast ROS:  Respiratory ROS: negative for - cough, pleuritic pain, shortness of breath or wheezing Cardiovascular ROS: negative for - chest pain, dyspnea on exertion, edema, irregular heartbeat, murmur, orthopnea, palpitations, paroxysmal nocturnal dyspnea or rapid heart rate. She does get occasional palpitations when her asthma flares up. Previous cardiac evaluation unremarkable. Gastrointestinal ROS: negative for - abdominal pain, appetite loss, blood in stools, change in bowel habits, constipation, diarrhea, heartburn, hematemesis, melena, nausea/vomiting or swallowing difficulty/pain Genito-Urinary ROS: negative for -  dysuria, hematuria, incontinence, or urinary frequency/urgency Musculoskeletal ROS: She has been getting some discomfort in the saddle area of her posterior thighs bilaterally. Neurological ROS: negative for - behavioral changes, confusion,  gait disturbance, headaches, impaired coordination/balance, she  does get dizzy if she turns her head quickly from side to side. No real vertigo. memory loss, numbness/tingling,  Dermatological ROS: negative for rash, ecchymosis Remaining ROS negative.  Physical Exam: Blood pressure 115/71, pulse 73, temperature 97.4 F (36.3 C), temperature source Oral, resp. rate 18, height 5\' 5"  (1.651 m), weight 173 lb 14.4 oz (78.881 kg). Wt Readings from Last 3 Encounters:  03/08/13 173 lb 14.4 oz (78.881 kg)     General appearance: Well-nourished Caucasian woman HENNT: Pharynx no erythema, exudate, mass, or ulcer. No thyromegaly or thyroid nodules Lymph nodes: No cervical, supraclavicular, or axillary lymphadenopathy Breasts:  Lungs: Clear to auscultation, resonant to percussion throughout Heart: Regular rhythm, no murmur, no gallop, no rub, no click, no edema Abdomen: Soft, nontender, normal bowel sounds, no mass, no organomegaly Extremities: No edema, no calf tenderness Musculoskeletal: no joint deformities GU Vascular: Carotid pulses 2+, no bruits, distal pulses: Dorsalis pedis 1+ symmetric Neurologic: Alert, oriented, PERRLA, I could not get a good look at her optic discs., cranial nerves grossly normal, motor strength 5 over 5, reflexes 1+ symmetric, upper body coordination normal, gait normal, sensation intact to  vibration over the fingertips Skin: No rash or ecchymosis  Lab today 03/08/13: Hemoglobin 10.6, hematocrit 33.6, MCV 79, white count 6500, 55 neutrophils, 35 lymphocytes, 8 monocytes, 2 eosinophils, platelets 290,000  Lab Results: Lab Results  Component Value Date   WBC 7.1 10/19/2011   HGB 8.8* 10/19/2011   HCT 30.2* 10/19/2011   MCV 74.0* 10/19/2011   PLT 150 10/19/2011     Chemistry      Component Value Date/Time   NA 136 10/19/2011 2115   K 3.6 10/19/2011 2115   CL 101 10/19/2011 2115   CO2 23 10/19/2011 2115   BUN 17 10/19/2011 2115   CREATININE 0.63 10/19/2011 2115      Component Value Date/Time   CALCIUM 8.9 10/19/2011 2115    ALKPHOS 81 10/19/2011 2115   AST 17 10/19/2011 2115   ALT 7 10/19/2011 2115   BILITOT 0.2* 10/19/2011 2115    Iron studies pending   Review of peripheral blood film: Normochromic normocytic red cells. No spherocytes or schistocytes. No polychromasia. Mature neutrophils and lymphocytes. Occasional benign reactive lymphocytes. Platelets normal in morphology and number.   Radiological Studies: See discussion above    Impression and Plan: #1. Iron malabsorption anemia It appears the most reasonable strategy would be to monitor her hemoglobin and ferritin levels every 3-4  months and administer additional doses of parenteral iron as needed when ferritin falls below 15. She believes she has tried multiple different iron preparations including ferrous gluconate and cannot tolerate them.   #2Marlow Baars disorder in a second degree but not first degree relative Despite problems as a young woman with menorrhagia which was likely related to severe endometriosis, she's not had any significant bleeding with subsequent major surgeries and there is no family history of excessive bleeding in a first degree relative. I feel that it is unlikely that she has von Willebrand's disorder but due to her concern I will check a VW profile      Levert Feinstein, MD 03/08/2013, 1:21 PM

## 2013-03-08 NOTE — Progress Notes (Signed)
Checked in patient with no financial issues and she has not left the country and has her appt card.

## 2013-03-09 LAB — APTT: aPTT: 34 seconds (ref 24–37)

## 2013-03-10 DIAGNOSIS — R1032 Left lower quadrant pain: Secondary | ICD-10-CM | POA: Diagnosis not present

## 2013-03-10 LAB — VON WILLEBRAND PANEL
Coagulation Factor VIII: 173 % — ABNORMAL HIGH (ref 73–140)
Ristocetin Co-factor, Plasma: 101 % (ref 42–200)

## 2013-03-13 DIAGNOSIS — N200 Calculus of kidney: Secondary | ICD-10-CM | POA: Diagnosis not present

## 2013-03-13 DIAGNOSIS — R1032 Left lower quadrant pain: Secondary | ICD-10-CM | POA: Diagnosis not present

## 2013-03-15 DIAGNOSIS — N289 Disorder of kidney and ureter, unspecified: Secondary | ICD-10-CM | POA: Diagnosis not present

## 2013-03-15 DIAGNOSIS — R1032 Left lower quadrant pain: Secondary | ICD-10-CM | POA: Diagnosis not present

## 2013-03-16 ENCOUNTER — Other Ambulatory Visit: Payer: Self-pay

## 2013-03-24 ENCOUNTER — Other Ambulatory Visit (HOSPITAL_BASED_OUTPATIENT_CLINIC_OR_DEPARTMENT_OTHER): Payer: Medicare Other | Admitting: Lab

## 2013-03-24 ENCOUNTER — Telehealth: Payer: Self-pay | Admitting: Oncology

## 2013-03-24 ENCOUNTER — Ambulatory Visit (HOSPITAL_BASED_OUTPATIENT_CLINIC_OR_DEPARTMENT_OTHER): Payer: Medicare Other

## 2013-03-24 ENCOUNTER — Ambulatory Visit (HOSPITAL_BASED_OUTPATIENT_CLINIC_OR_DEPARTMENT_OTHER): Payer: Medicare Other | Admitting: Oncology

## 2013-03-24 VITALS — BP 170/97 | HR 84 | Temp 98.2°F | Resp 18 | Ht 65.0 in | Wt 175.9 lb

## 2013-03-24 VITALS — BP 158/84 | HR 76

## 2013-03-24 DIAGNOSIS — D699 Hemorrhagic condition, unspecified: Secondary | ICD-10-CM | POA: Diagnosis not present

## 2013-03-24 DIAGNOSIS — D509 Iron deficiency anemia, unspecified: Secondary | ICD-10-CM

## 2013-03-24 LAB — MORPHOLOGY: PLT EST: ADEQUATE

## 2013-03-24 LAB — CBC & DIFF AND RETIC
BASO%: 0.3 % (ref 0.0–2.0)
Basophils Absolute: 0 10*3/uL (ref 0.0–0.1)
HCT: 36.3 % (ref 34.8–46.6)
HGB: 11.2 g/dL — ABNORMAL LOW (ref 11.6–15.9)
Immature Retic Fract: 14.3 % — ABNORMAL HIGH (ref 1.60–10.00)
LYMPH%: 22.9 % (ref 14.0–49.7)
MCH: 24.2 pg — ABNORMAL LOW (ref 25.1–34.0)
MCHC: 30.9 g/dL — ABNORMAL LOW (ref 31.5–36.0)
MONO#: 0.5 10*3/uL (ref 0.1–0.9)
MONO%: 7 % (ref 0.0–14.0)
NEUT%: 68.2 % (ref 38.4–76.8)
Platelets: 285 10*3/uL (ref 145–400)
RBC: 4.63 10*6/uL (ref 3.70–5.45)
Retic %: 2.24 % — ABNORMAL HIGH (ref 0.70–2.10)
WBC: 7.4 10*3/uL (ref 3.9–10.3)
lymph#: 1.7 10*3/uL (ref 0.9–3.3)

## 2013-03-24 LAB — IRON AND TIBC CHCC
%SAT: 10 % — ABNORMAL LOW (ref 21–57)
TIBC: 469 ug/dL — ABNORMAL HIGH (ref 236–444)
UIBC: 423 ug/dL — ABNORMAL HIGH (ref 120–384)

## 2013-03-24 LAB — CHCC SMEAR

## 2013-03-24 MED ORDER — FERUMOXYTOL INJECTION 510 MG/17 ML
1020.0000 mg | Freq: Once | INTRAVENOUS | Status: AC
  Administered 2013-03-24: 1020 mg via INTRAVENOUS
  Filled 2013-03-24: qty 34

## 2013-03-24 MED ORDER — SODIUM CHLORIDE 0.9 % IV SOLN
Freq: Once | INTRAVENOUS | Status: AC
  Administered 2013-03-24: 14:00:00 via INTRAVENOUS

## 2013-03-24 NOTE — Progress Notes (Signed)
Short interim followup visit for this pleasant 67 year old woman evaluated here last month for oral iron intolerance with attendant iron deficiency anemia. Another concern was that some non-first-degree family members had von Willebrand's disorder. She has no personal risk factors for bleeding and had had previous surgical procedures without any problems. I thought that she was at low risk to have von Willebrand's disorder but she wanted to be checked so I did.  Lab work returned showing that she had factor VIII and von Willebrand factor levels higher than the highest control absolutely excluding a diagnosis of von Willebrand's disease.  Iron profile showed despite a dose of IV iron she received back in January with significant rise in her hemoglobin, her iron stores were still quite low with a serum ferritin level of 7.  Hemoglobin today is 11.2 with MCV 78.4.  Impression: #1. Intolerance for oral iron #2. Iron deficiency anemia secondary to #1 #3. Von Willebrand's disorder excluded.  Plan: I'm going to give her another dose of parenteral iron 1020 mg IV over 20 minutes in our office today. I'm recommending checking blood counts every 4 months along with ferritin levels. Most individuals will only need one or 2 doses of iron every one-2 years. She was like to continue to follow me when I transition to the [facility] teaching service next year.

## 2013-03-24 NOTE — Patient Instructions (Signed)

## 2013-03-24 NOTE — Progress Notes (Signed)
1545-pt feels fine . I sent her to scheduler for next appts.

## 2013-03-24 NOTE — Progress Notes (Signed)
IV deaccessed. Pt sitting upright and felt "lightheaded".Placed in supine position. Flat- VSS no diaphoresis.

## 2013-03-24 NOTE — Telephone Encounter (Signed)
gv and printed appt sched and avs for pt for March, July, and [facility]

## 2013-03-30 LAB — VON WILLEBRAND FACTOR MULTIMER: Factor-VIII Activity: 128 % (ref 50–180)

## 2013-03-31 ENCOUNTER — Telehealth: Payer: Self-pay | Admitting: *Deleted

## 2013-03-31 NOTE — Telephone Encounter (Signed)
Notified pt of normal Von Wilebrand profile per Dr Cyndie Chime & she expressed appreciation for call.

## 2013-03-31 NOTE — Telephone Encounter (Signed)
Message copied by Sabino Snipes on Fri Mar 31, 2013 12:00 PM ------      Message from: Levert Feinstein      Created: Thu Mar 30, 2013  4:19 PM       Call pt: repeat Von Willebrand profile perfectly normal ------

## 2013-04-17 DIAGNOSIS — Z23 Encounter for immunization: Secondary | ICD-10-CM | POA: Diagnosis not present

## 2013-05-01 ENCOUNTER — Ambulatory Visit (HOSPITAL_BASED_OUTPATIENT_CLINIC_OR_DEPARTMENT_OTHER): Payer: Medicare Other

## 2013-05-01 ENCOUNTER — Telehealth: Payer: Self-pay | Admitting: *Deleted

## 2013-05-01 DIAGNOSIS — D509 Iron deficiency anemia, unspecified: Secondary | ICD-10-CM

## 2013-05-01 LAB — CBC WITH DIFFERENTIAL/PLATELET
BASO%: 1.1 % (ref 0.0–2.0)
Basophils Absolute: 0.1 10*3/uL (ref 0.0–0.1)
EOS%: 2.2 % (ref 0.0–7.0)
Eosinophils Absolute: 0.2 10*3/uL (ref 0.0–0.5)
HCT: 36 % (ref 34.8–46.6)
HGB: 12.1 g/dL (ref 11.6–15.9)
LYMPH%: 25.9 % (ref 14.0–49.7)
MCHC: 33.6 g/dL (ref 31.5–36.0)
MCV: 84 fL (ref 79.5–101.0)
MONO#: 0.5 10*3/uL (ref 0.1–0.9)
NEUT#: 4.5 10*3/uL (ref 1.5–6.5)
NEUT%: 63.5 % (ref 38.4–76.8)
Platelets: 217 10*3/uL (ref 145–400)
WBC: 7.1 10*3/uL (ref 3.9–10.3)
lymph#: 1.8 10*3/uL (ref 0.9–3.3)

## 2013-05-01 NOTE — Telephone Encounter (Signed)
Received message from pt stating "I want to be set-up for iron infusion this week; can come on Friday"  Caitlyn Blow, NP notified of request--per Misty Stanley; called and notified pt that she would need to come in for lab work and then if needed would set-up infusion later this week.  Pt verbalized understanding.  POF completed; pt verbalized understanding schedulers will notify pt of appt time today.

## 2013-05-02 ENCOUNTER — Telehealth: Payer: Self-pay | Admitting: *Deleted

## 2013-05-02 NOTE — Telephone Encounter (Signed)
Message copied by Sabino Snipes on Tue May 02, 2013 11:02 AM ------      Message from: Lonna Cobb K      Created: Tue May 02, 2013 10:52 AM       Please let her know hemoglobin and ferritin are in normal range. She does not need IV iron at this time. Thanks.      ----- Message -----         From: [facility]         Sent: 05/01/2013   2:01 PM           To: Rana Snare, NP                   ------

## 2013-05-02 NOTE — Telephone Encounter (Signed)
Called pt & informed of HGB & ferritin results & no need for iron at this time per Lonna Cobb NP.  She reports that maybe she is just tired from all the stuff she has been doing.  Suggested she try to get some rest in somewhere.

## 2013-06-28 ENCOUNTER — Telehealth: Payer: Self-pay | Admitting: Oncology

## 2013-06-28 NOTE — Telephone Encounter (Signed)
Pt called and r/s appt for 08/14/13

## 2013-07-08 ENCOUNTER — Encounter: Payer: Self-pay | Admitting: Oncology

## 2013-07-14 ENCOUNTER — Other Ambulatory Visit: Payer: Medicare Other

## 2013-07-26 DIAGNOSIS — M9981 Other biomechanical lesions of cervical region: Secondary | ICD-10-CM | POA: Diagnosis not present

## 2013-07-26 DIAGNOSIS — Q762 Congenital spondylolisthesis: Secondary | ICD-10-CM | POA: Diagnosis not present

## 2013-07-26 DIAGNOSIS — M999 Biomechanical lesion, unspecified: Secondary | ICD-10-CM | POA: Diagnosis not present

## 2013-07-27 DIAGNOSIS — M999 Biomechanical lesion, unspecified: Secondary | ICD-10-CM | POA: Diagnosis not present

## 2013-07-27 DIAGNOSIS — Q762 Congenital spondylolisthesis: Secondary | ICD-10-CM | POA: Diagnosis not present

## 2013-07-27 DIAGNOSIS — M9981 Other biomechanical lesions of cervical region: Secondary | ICD-10-CM | POA: Diagnosis not present

## 2013-08-07 ENCOUNTER — Other Ambulatory Visit: Payer: Self-pay

## 2013-08-07 DIAGNOSIS — Z1231 Encounter for screening mammogram for malignant neoplasm of breast: Secondary | ICD-10-CM

## 2013-08-07 DIAGNOSIS — Z9889 Other specified postprocedural states: Secondary | ICD-10-CM

## 2013-08-10 DIAGNOSIS — M4310 Spondylolisthesis, site unspecified: Secondary | ICD-10-CM | POA: Diagnosis not present

## 2013-08-10 DIAGNOSIS — M48061 Spinal stenosis, lumbar region without neurogenic claudication: Secondary | ICD-10-CM | POA: Diagnosis not present

## 2013-08-10 DIAGNOSIS — M545 Low back pain, unspecified: Secondary | ICD-10-CM | POA: Diagnosis not present

## 2013-08-14 ENCOUNTER — Telehealth: Payer: Self-pay | Admitting: Oncology

## 2013-08-14 ENCOUNTER — Other Ambulatory Visit (HOSPITAL_BASED_OUTPATIENT_CLINIC_OR_DEPARTMENT_OTHER): Payer: Medicare Other

## 2013-08-14 ENCOUNTER — Other Ambulatory Visit: Payer: Self-pay | Admitting: Oncology

## 2013-08-14 DIAGNOSIS — D509 Iron deficiency anemia, unspecified: Secondary | ICD-10-CM

## 2013-08-14 LAB — CBC WITH DIFFERENTIAL/PLATELET
BASO%: 0.6 % (ref 0.0–2.0)
BASOS ABS: 0.1 10*3/uL (ref 0.0–0.1)
EOS ABS: 0.3 10*3/uL (ref 0.0–0.5)
EOS%: 3.7 % (ref 0.0–7.0)
HCT: 36.9 % (ref 34.8–46.6)
HEMOGLOBIN: 11.9 g/dL (ref 11.6–15.9)
LYMPH%: 24.5 % (ref 14.0–49.7)
MCH: 27 pg (ref 25.1–34.0)
MCHC: 32.3 g/dL (ref 31.5–36.0)
MCV: 83.7 fL (ref 79.5–101.0)
MONO#: 0.6 10*3/uL (ref 0.1–0.9)
MONO%: 7.7 % (ref 0.0–14.0)
NEUT#: 5.1 10*3/uL (ref 1.5–6.5)
NEUT%: 63.5 % (ref 38.4–76.8)
Platelets: 233 10*3/uL (ref 145–400)
RBC: 4.42 10*6/uL (ref 3.70–5.45)
RDW: 14.5 % (ref 11.2–14.5)
WBC: 8 10*3/uL (ref 3.9–10.3)
lymph#: 2 10*3/uL (ref 0.9–3.3)

## 2013-08-14 LAB — FERRITIN CHCC: Ferritin: 10 ng/ml (ref 9–269)

## 2013-08-14 NOTE — Telephone Encounter (Signed)
lmonvm for pt re appt for 4/9

## 2013-08-16 ENCOUNTER — Other Ambulatory Visit: Payer: Self-pay

## 2013-08-16 ENCOUNTER — Telehealth: Payer: Self-pay | Admitting: *Deleted

## 2013-08-16 NOTE — Telephone Encounter (Signed)
Message copied by Jesse Fall on Wed Aug 16, 2013  1:05 PM ------      Message from: Annia Belt      Created: Mon Aug 14, 2013  1:47 PM       Call pt Hb stable at 12 Iron storage protein now normal.  We will get her an appt at [facility] for June ------

## 2013-08-16 NOTE — Telephone Encounter (Signed)
Message copied by Jesse Fall on Wed Aug 16, 2013  1:04 PM ------      Message from: Annia Belt      Created: Mon Aug 14, 2013  3:50 PM       Call pt: she would benefit from another dose of IV iron.. I will schedule to be given at [facility] for 4/9.  Let me know if this day doesn't work.      DrG ------

## 2013-08-16 NOTE — Telephone Encounter (Signed)
Pt notified of lab results per Dr Beryle Beams & need for iron infusion.  She is already scheduled for 08/17/13 & will be here.

## 2013-08-17 ENCOUNTER — Ambulatory Visit (HOSPITAL_BASED_OUTPATIENT_CLINIC_OR_DEPARTMENT_OTHER): Payer: Medicare Other

## 2013-08-17 VITALS — BP 159/82 | HR 76 | Temp 98.6°F | Resp 16

## 2013-08-17 DIAGNOSIS — D509 Iron deficiency anemia, unspecified: Secondary | ICD-10-CM | POA: Diagnosis not present

## 2013-08-17 MED ORDER — SODIUM CHLORIDE 0.9 % IV SOLN
1020.0000 mg | Freq: Once | INTRAVENOUS | Status: AC
  Administered 2013-08-17: 1020 mg via INTRAVENOUS
  Filled 2013-08-17: qty 34

## 2013-08-17 MED ORDER — SODIUM CHLORIDE 0.9 % IV SOLN
Freq: Once | INTRAVENOUS | Status: AC
  Administered 2013-08-17: 15:00:00 via INTRAVENOUS

## 2013-08-22 ENCOUNTER — Encounter: Payer: Self-pay | Admitting: Oncology

## 2013-08-22 ENCOUNTER — Ambulatory Visit (INDEPENDENT_AMBULATORY_CARE_PROVIDER_SITE_OTHER): Payer: Medicare Other | Admitting: Oncology

## 2013-08-22 VITALS — BP 144/81 | HR 88 | Temp 97.0°F | Ht 65.0 in | Wt 183.8 lb

## 2013-08-22 DIAGNOSIS — J45909 Unspecified asthma, uncomplicated: Secondary | ICD-10-CM | POA: Diagnosis not present

## 2013-08-22 DIAGNOSIS — K219 Gastro-esophageal reflux disease without esophagitis: Secondary | ICD-10-CM

## 2013-08-22 DIAGNOSIS — IMO0002 Reserved for concepts with insufficient information to code with codable children: Secondary | ICD-10-CM | POA: Diagnosis not present

## 2013-08-22 DIAGNOSIS — N289 Disorder of kidney and ureter, unspecified: Secondary | ICD-10-CM | POA: Diagnosis not present

## 2013-08-22 DIAGNOSIS — M5414 Radiculopathy, thoracic region: Secondary | ICD-10-CM | POA: Insufficient documentation

## 2013-08-22 DIAGNOSIS — N2889 Other specified disorders of kidney and ureter: Secondary | ICD-10-CM

## 2013-08-22 DIAGNOSIS — R7309 Other abnormal glucose: Secondary | ICD-10-CM | POA: Diagnosis not present

## 2013-08-22 DIAGNOSIS — F329 Major depressive disorder, single episode, unspecified: Secondary | ICD-10-CM | POA: Diagnosis not present

## 2013-08-22 DIAGNOSIS — K449 Diaphragmatic hernia without obstruction or gangrene: Secondary | ICD-10-CM

## 2013-08-22 DIAGNOSIS — D509 Iron deficiency anemia, unspecified: Secondary | ICD-10-CM

## 2013-08-22 DIAGNOSIS — R071 Chest pain on breathing: Secondary | ICD-10-CM | POA: Diagnosis not present

## 2013-08-22 DIAGNOSIS — I059 Rheumatic mitral valve disease, unspecified: Secondary | ICD-10-CM | POA: Diagnosis not present

## 2013-08-22 DIAGNOSIS — D649 Anemia, unspecified: Secondary | ICD-10-CM | POA: Diagnosis not present

## 2013-08-22 DIAGNOSIS — I10 Essential (primary) hypertension: Secondary | ICD-10-CM | POA: Diagnosis not present

## 2013-08-22 HISTORY — DX: Other specified disorders of kidney and ureter: N28.89

## 2013-08-22 HISTORY — DX: Radiculopathy, thoracic region: M54.14

## 2013-08-22 NOTE — Progress Notes (Signed)
Patient ID: Caitlyn [name], female   DOB: 04-29-1946, 68 y.o.   MRN: [id number] Hematology and Oncology Follow Up Visit  Caitlyn [name] [id number] [birth year] 68 y.o. 08/22/2013 1:22 PM   Principle Diagnosis: Encounter Diagnoses  Name Primary?  . Radicular pain of thoracic region Yes  . Iron deficiency anemia, unspecified      Interim History:   Caitlyn [name] will turn 68 years old next week. I initially saw her in October 2014 to evaluate iron deficiency anemia. She had a negative upper and lower endoscopy except for a hiatus hernia. She reported extreme intolerance to oral iron preparations. Hemoglobin got as low as 7 g. When I saw her on October 29 ferritin was 7. Hemoglobin 10.6. MCV 79. I believe this was a post transfusion value. She received a dose of parenteral iron in our office 1020 milligrams on 03/24/2013. Hemoglobin came up to a peak value of 12.1 g by 05/01/2013 and has remained stable with most recent value of 11.9 with MCV 84 on 08/14/2013. Ferritin came up to a peak value of 123 by December 22 but has fallen rapidly down to 10 by 08/14/2013. She denies any hematochezia or melena. Only complaint today is a 4 month history of intermittent radicular pain approximately T8-T9 left side. On further questioning she does admit to pushing a heavy wardrobe just before she started having this pain. She has no focal weakness. No paresthesias. No continuous rash. She's had no other interim medical problems.   Medications: reviewed  Allergies:  Allergies  Allergen Reactions  . Cortisone Anaphylaxis    Some forms of cortisone  . Prednisone Anaphylaxis    confusion  . Demerol [Meperidine]   . Morphine And Related Other (See Comments)    Vomiting, shaking, & elevates BP  . Sulfa Antibiotics     Review of Systems: Hematology:  No bleeding or bruising ENT ROS: No sore throat  Breast ROS:  Respiratory ROS: No cough or dyspnea Cardiovascular ROS:  No ischemic type chest pain or  palpitations Gastrointestinal ROS: No change in bowel habit   Genito-Urinary ROS: No urinary tract symptoms. No hematuria. Musculoskeletal ROS: No muscle bone or joint pain other than noted in the history of present illness Neurological ROS: No headache or change in vision Dermatological ROS: No rash or ecchymosis Remaining ROS negative:   Physical Exam: Blood pressure 144/81, pulse 88, temperature 97 F (36.1 C), temperature source Oral, height 5\' 5"  (1.651 m), weight 183 lb 12.8 oz (83.371 kg), SpO2 96.00%. Wt Readings from Last 3 Encounters:  08/22/13 183 lb 12.8 oz (83.371 kg)  03/24/13 175 lb 14.4 oz (79.788 kg)  03/08/13 173 lb 14.4 oz (78.881 kg)     General appearance: Well-nourished Caucasian woman HENNT: Pharynx no erythema, exudate, mass, or ulcer. No thyromegaly or thyroid nodules Lymph nodes: No cervical, supraclavicular, or axillary lymphadenopathy Breasts:  Lungs: Clear to auscultation, resonant to percussion throughout Heart: Regular rhythm, no murmur, no gallop, no rub, no click, no edema Abdomen: Soft, nontender, normal bowel sounds, no mass, no organomegaly Extremities: No edema, no calf tenderness Musculoskeletal: no joint deformities. No punch tenderness over the spine or flanks. No pain on palpation over the ribs. GU:  Vascular: Carotid pulses 2+, no bruits,  Neurologic: Alert, oriented, PERRLA, optic discs sharp and vessels normal, no hemorrhage or exudate, cranial nerves grossly normal, motor strength 5 over 5, reflexes 1+ symmetric, upper body coordination normal, gait normal, Skin: No rash or ecchymosis  Lab Results: CBC W/Diff  Component Value Date/Time   WBC 8.0 08/14/2013 1318   WBC 7.1 10/19/2011 2115   RBC 4.42 08/14/2013 1318   RBC 4.08 10/19/2011 2115   HGB 11.9 08/14/2013 1318   HGB 8.8* 10/19/2011 2115   HCT 36.9 08/14/2013 1318   HCT 30.2* 10/19/2011 2115   PLT 233 08/14/2013 1318   PLT 150 10/19/2011 2115   MCV 83.7 08/14/2013 1318   MCV 74.0*  10/19/2011 2115   MCH 27.0 08/14/2013 1318   MCH 21.6* 10/19/2011 2115   MCHC 32.3 08/14/2013 1318   MCHC 29.1* 10/19/2011 2115   RDW 14.5 08/14/2013 1318   RDW 16.4* 10/19/2011 2115   LYMPHSABS 2.0 08/14/2013 1318   LYMPHSABS 2.4 10/19/2011 2115   MONOABS 0.6 08/14/2013 1318   MONOABS 0.5 10/19/2011 2115   EOSABS 0.3 08/14/2013 1318   EOSABS 0.1 10/19/2011 2115   BASOSABS 0.1 08/14/2013 1318   BASOSABS 0.0 10/19/2011 2115     Chemistry      Component Value Date/Time   NA 136 10/19/2011 2115   K 3.6 10/19/2011 2115   CL 101 10/19/2011 2115   CO2 23 10/19/2011 2115   BUN 17 10/19/2011 2115   CREATININE 0.63 10/19/2011 2115      Component Value Date/Time   CALCIUM 8.9 10/19/2011 2115   ALKPHOS 81 10/19/2011 2115   AST 17 10/19/2011 2115   ALT 7 10/19/2011 2115   BILITOT 0.2* 10/19/2011 2115        Impression:  #1. Intolerance to oral iron. Improvement in hemoglobin with trial of parenteral iron. However, ferritin has fallen back to previous levels just 5 months after a 1 g dose of IV iron. Plan: I will continue every 4 months lab monitoring with CBC and ferritin. She was given another dose of parenteral iron in my office on 08/17/2013.  #2. Approximate left T8 radicular pain Possibly a pinched nerve related to trying to push a heavy object.  #3. Solid lesion right kidney incidentally noted on a CT scan of the abdomen and pelvis in May 2014. This requires further followup. I didn't remember this fact at time of visit and will need to call the patient and apprise her that we will schedule her for a followup scan.  #4. Hiatus hernia with reflux  #5. Hypothyroid on replacement  #6. Mitral valve prolapse  #7. Asthma   CC: Patient Care Team: Josetta Huddle, MD as PCP - General (Internal Medicine) Annia Belt, MD as Consulting Physician (Oncology)   Annia Belt, MD 4/14/20151:22 PM

## 2013-08-22 NOTE — Patient Instructions (Signed)
Continue every 4 month lab checks at [place] with Dr Darnell Level at [facility] in 8 months  04/24/14 30 minutes

## 2013-08-23 ENCOUNTER — Ambulatory Visit: Payer: Medicare Other

## 2013-08-23 DIAGNOSIS — M6530 Trigger finger, unspecified finger: Secondary | ICD-10-CM | POA: Diagnosis not present

## 2013-08-23 DIAGNOSIS — M19049 Primary osteoarthritis, unspecified hand: Secondary | ICD-10-CM | POA: Diagnosis not present

## 2013-08-25 ENCOUNTER — Ambulatory Visit (HOSPITAL_COMMUNITY): Payer: Medicare Other

## 2013-08-29 ENCOUNTER — Other Ambulatory Visit: Payer: Self-pay | Admitting: Oncology

## 2013-08-29 ENCOUNTER — Other Ambulatory Visit (INDEPENDENT_AMBULATORY_CARE_PROVIDER_SITE_OTHER): Payer: Medicare Other

## 2013-08-29 ENCOUNTER — Ambulatory Visit (HOSPITAL_COMMUNITY)
Admission: RE | Admit: 2013-08-29 | Discharge: 2013-08-29 | Disposition: A | Discharge status: Home / Self Care | Payer: Medicare Other | Source: Ambulatory Visit | Attending: Oncology | Admitting: Oncology

## 2013-08-29 DIAGNOSIS — K449 Diaphragmatic hernia without obstruction or gangrene: Secondary | ICD-10-CM | POA: Insufficient documentation

## 2013-08-29 DIAGNOSIS — N2889 Other specified disorders of kidney and ureter: Secondary | ICD-10-CM

## 2013-08-29 DIAGNOSIS — N289 Disorder of kidney and ureter, unspecified: Secondary | ICD-10-CM | POA: Diagnosis not present

## 2013-08-29 DIAGNOSIS — K5730 Diverticulosis of large intestine without perforation or abscess without bleeding: Secondary | ICD-10-CM | POA: Insufficient documentation

## 2013-08-29 LAB — COMPLETE METABOLIC PANEL WITH GFR
ALT: 14 U/L (ref 0–35)
AST: 19 U/L (ref 0–37)
Albumin: 4 g/dL (ref 3.5–5.2)
Alkaline Phosphatase: 94 U/L (ref 39–117)
BUN: 24 mg/dL — AB (ref 6–23)
CALCIUM: 9.2 mg/dL (ref 8.4–10.5)
CO2: 22 meq/L (ref 19–32)
CREATININE: 0.73 mg/dL (ref 0.50–1.10)
Chloride: 102 mEq/L (ref 96–112)
GFR, EST NON AFRICAN AMERICAN: 84 mL/min
GLUCOSE: 152 mg/dL — AB (ref 70–99)
Potassium: 4.4 mEq/L (ref 3.5–5.3)
Sodium: 142 mEq/L (ref 135–145)
Total Bilirubin: 0.3 mg/dL (ref 0.3–1.2)
Total Protein: 7.1 g/dL (ref 6.0–8.3)

## 2013-08-29 LAB — URINALYSIS, ROUTINE W REFLEX MICROSCOPIC
Bilirubin Urine: NEGATIVE
Glucose, UA: NEGATIVE mg/dL
Hgb urine dipstick: NEGATIVE
Ketones, ur: NEGATIVE mg/dL
NITRITE: NEGATIVE
PROTEIN: NEGATIVE mg/dL
Specific Gravity, Urine: 1.028 (ref 1.005–1.030)
UROBILINOGEN UA: 0.2 mg/dL (ref 0.0–1.0)
pH: 5.5 (ref 5.0–8.0)

## 2013-08-29 LAB — URINALYSIS, MICROSCOPIC ONLY
CRYSTALS: NONE SEEN
Casts: NONE SEEN

## 2013-08-29 MED ORDER — IOHEXOL 300 MG/ML  SOLN
80.0000 mL | Freq: Once | INTRAMUSCULAR | Status: AC | PRN
  Administered 2013-08-29: 100 mL via INTRAVENOUS

## 2013-08-30 ENCOUNTER — Other Ambulatory Visit: Payer: Self-pay | Admitting: Oncology

## 2013-08-30 DIAGNOSIS — N2889 Other specified disorders of kidney and ureter: Secondary | ICD-10-CM

## 2013-09-07 ENCOUNTER — Other Ambulatory Visit: Payer: Self-pay | Admitting: Oncology

## 2013-09-07 DIAGNOSIS — N2889 Other specified disorders of kidney and ureter: Secondary | ICD-10-CM

## 2013-09-27 DIAGNOSIS — H35379 Puckering of macula, unspecified eye: Secondary | ICD-10-CM | POA: Diagnosis not present

## 2013-09-27 DIAGNOSIS — H2510 Age-related nuclear cataract, unspecified eye: Secondary | ICD-10-CM | POA: Diagnosis not present

## 2013-09-27 DIAGNOSIS — H5200 Hypermetropia, unspecified eye: Secondary | ICD-10-CM | POA: Diagnosis not present

## 2013-10-09 ENCOUNTER — Other Ambulatory Visit (INDEPENDENT_AMBULATORY_CARE_PROVIDER_SITE_OTHER): Payer: Medicare Other

## 2013-10-09 ENCOUNTER — Other Ambulatory Visit: Payer: Self-pay | Admitting: Oncology

## 2013-10-09 DIAGNOSIS — D509 Iron deficiency anemia, unspecified: Secondary | ICD-10-CM

## 2013-10-09 LAB — CBC WITH DIFFERENTIAL/PLATELET
BASOS PCT: 1 % (ref 0–1)
Basophils Absolute: 0.1 10*3/uL (ref 0.0–0.1)
EOS ABS: 0.1 10*3/uL (ref 0.0–0.7)
Eosinophils Relative: 2 % (ref 0–5)
HCT: 41.6 % (ref 36.0–46.0)
Hemoglobin: 14 g/dL (ref 12.0–15.0)
Lymphocytes Relative: 26 % (ref 12–46)
Lymphs Abs: 1.6 10*3/uL (ref 0.7–4.0)
MCH: 28.6 pg (ref 26.0–34.0)
MCHC: 33.7 g/dL (ref 30.0–36.0)
MCV: 85.1 fL (ref 78.0–100.0)
Monocytes Absolute: 0.4 10*3/uL (ref 0.1–1.0)
Monocytes Relative: 6 % (ref 3–12)
NEUTROS PCT: 65 % (ref 43–77)
Neutro Abs: 4 10*3/uL (ref 1.7–7.7)
PLATELETS: 223 10*3/uL (ref 150–400)
RBC: 4.89 MIL/uL (ref 3.87–5.11)
RDW: 17.3 % — ABNORMAL HIGH (ref 11.5–15.5)
WBC: 6.1 10*3/uL (ref 4.0–10.5)

## 2013-10-10 ENCOUNTER — Other Ambulatory Visit: Payer: Medicare Other

## 2013-10-10 LAB — FERRITIN: FERRITIN: 162 ng/mL (ref 10–291)

## 2013-10-12 ENCOUNTER — Telehealth: Payer: Self-pay | Admitting: *Deleted

## 2013-10-12 NOTE — Telephone Encounter (Signed)
Pt notified that red blood count now perfectly normal, per Dr. Beryle Beams and that the IV iron worked and that Dr. Beryle Beams will continue to monitor her every 4 months. Pt replied "wonderful" and thanked [place] for the phone call. Yvonna Alanis, RN, 10/12/13 - 12:58PM

## 2013-10-16 ENCOUNTER — Other Ambulatory Visit: Payer: Self-pay | Admitting: Oncology

## 2013-10-16 ENCOUNTER — Telehealth: Payer: Self-pay | Admitting: *Deleted

## 2013-10-16 DIAGNOSIS — D509 Iron deficiency anemia, unspecified: Secondary | ICD-10-CM

## 2013-10-16 NOTE — Telephone Encounter (Signed)
Pt notified that Dr. Beryle Beams did not need her to come in to be seen 10/17/13. Pt so informed and verbalized understanding of this appt cancellation. Dr. Beryle Beams then ordered labs every 2 months and sent message to front office to call pt to set up the next lab appt. Yvonna Alanis, RN, 10/16/13, 3:41 PM

## 2013-10-16 NOTE — Telephone Encounter (Signed)
Pt states she received a call this AM and thought this RN was trying to contact her. Pt had not been called by me but did have an appt scheduled with Dr. Beryle Beams tomorrow at 2:30PM. Pt asks if it is necessary for her to come in since she just had lab drawn and IV iron "worked" - she "will come if he wants me to but if it is not needed, then I don't have to come on my account."

## 2013-10-17 ENCOUNTER — Encounter: Payer: Medicare Other | Admitting: Oncology

## 2013-10-21 ENCOUNTER — Telehealth: Payer: Self-pay | Admitting: Oncology

## 2013-10-21 NOTE — Telephone Encounter (Signed)
holiday - moved 7/2 to 7/3. s/w pt she is aware.

## 2013-11-09 ENCOUNTER — Other Ambulatory Visit: Payer: Medicare Other

## 2013-11-10 ENCOUNTER — Other Ambulatory Visit: Payer: Medicare Other

## 2013-11-22 DIAGNOSIS — M720 Palmar fascial fibromatosis [Dupuytren]: Secondary | ICD-10-CM | POA: Diagnosis not present

## 2013-11-22 DIAGNOSIS — G5600 Carpal tunnel syndrome, unspecified upper limb: Secondary | ICD-10-CM | POA: Diagnosis not present

## 2013-11-22 DIAGNOSIS — M79609 Pain in unspecified limb: Secondary | ICD-10-CM | POA: Diagnosis not present

## 2013-11-24 ENCOUNTER — Ambulatory Visit: Payer: Medicare Other

## 2013-11-24 DIAGNOSIS — M5412 Radiculopathy, cervical region: Secondary | ICD-10-CM | POA: Diagnosis not present

## 2013-11-24 DIAGNOSIS — M542 Cervicalgia: Secondary | ICD-10-CM | POA: Diagnosis not present

## 2013-11-24 DIAGNOSIS — M5030 Other cervical disc degeneration, unspecified cervical region: Secondary | ICD-10-CM | POA: Diagnosis not present

## 2013-11-24 DIAGNOSIS — M4310 Spondylolisthesis, site unspecified: Secondary | ICD-10-CM | POA: Diagnosis not present

## 2013-11-24 DIAGNOSIS — M545 Low back pain, unspecified: Secondary | ICD-10-CM | POA: Diagnosis not present

## 2013-11-24 DIAGNOSIS — M546 Pain in thoracic spine: Secondary | ICD-10-CM | POA: Diagnosis not present

## 2013-11-24 DIAGNOSIS — M47812 Spondylosis without myelopathy or radiculopathy, cervical region: Secondary | ICD-10-CM | POA: Diagnosis not present

## 2013-11-27 ENCOUNTER — Encounter (HOSPITAL_BASED_OUTPATIENT_CLINIC_OR_DEPARTMENT_OTHER)
Admission: RE | Admit: 2013-11-27 | Discharge: 2013-11-27 | Disposition: A | Discharge status: Home / Self Care | Payer: Medicare Other | Source: Ambulatory Visit | Attending: Orthopedic Surgery | Admitting: Orthopedic Surgery

## 2013-11-27 ENCOUNTER — Other Ambulatory Visit: Payer: Self-pay | Admitting: Orthopedic Surgery

## 2013-11-27 ENCOUNTER — Encounter (HOSPITAL_BASED_OUTPATIENT_CLINIC_OR_DEPARTMENT_OTHER): Payer: Self-pay | Admitting: *Deleted

## 2013-11-27 DIAGNOSIS — F1010 Alcohol abuse, uncomplicated: Secondary | ICD-10-CM | POA: Diagnosis not present

## 2013-11-27 DIAGNOSIS — Z01810 Encounter for preprocedural cardiovascular examination: Secondary | ICD-10-CM | POA: Diagnosis not present

## 2013-11-27 DIAGNOSIS — Z01812 Encounter for preprocedural laboratory examination: Secondary | ICD-10-CM | POA: Diagnosis not present

## 2013-11-27 DIAGNOSIS — M6530 Trigger finger, unspecified finger: Secondary | ICD-10-CM | POA: Diagnosis not present

## 2013-11-27 DIAGNOSIS — I10 Essential (primary) hypertension: Secondary | ICD-10-CM | POA: Diagnosis not present

## 2013-11-27 DIAGNOSIS — I2510 Atherosclerotic heart disease of native coronary artery without angina pectoris: Secondary | ICD-10-CM | POA: Diagnosis not present

## 2013-11-27 DIAGNOSIS — M729 Fibroblastic disorder, unspecified: Secondary | ICD-10-CM | POA: Diagnosis not present

## 2013-11-27 DIAGNOSIS — Z862 Personal history of diseases of the blood and blood-forming organs and certain disorders involving the immune mechanism: Secondary | ICD-10-CM | POA: Diagnosis not present

## 2013-11-27 DIAGNOSIS — K219 Gastro-esophageal reflux disease without esophagitis: Secondary | ICD-10-CM | POA: Diagnosis not present

## 2013-11-27 DIAGNOSIS — M129 Arthropathy, unspecified: Secondary | ICD-10-CM | POA: Diagnosis not present

## 2013-11-27 DIAGNOSIS — M720 Palmar fascial fibromatosis [Dupuytren]: Secondary | ICD-10-CM | POA: Diagnosis not present

## 2013-11-27 LAB — BASIC METABOLIC PANEL
Anion gap: 12 (ref 5–15)
BUN: 22 mg/dL (ref 6–23)
CO2: 25 mEq/L (ref 19–32)
Calcium: 9.1 mg/dL (ref 8.4–10.5)
Chloride: 103 mEq/L (ref 96–112)
Creatinine, Ser: 0.69 mg/dL (ref 0.50–1.10)
GFR calc Af Amer: >90 mL/min (ref >90)
GFR, EST NON AFRICAN AMERICAN: 86 mL/min — AB (ref >90)
Glucose, Bld: 135 mg/dL — ABNORMAL HIGH (ref 70–99)
Potassium: 4 mEq/L (ref 3.7–5.3)
Sodium: 140 mEq/L (ref 137–147)

## 2013-11-27 NOTE — H&P (Signed)
Caitlyn [name] is an 68 y.o. female.   Chief Complaint: c/o chronic and progressive STS symptoms right long finger HPI: . 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 She has lived in [place] and [place] seasonally for many years. She was evaluated in [place] by a Copy several years ago and was advised that she had bone spurs at the base of her right thumb. She now presents for evaluation of a chronic locking right long finger that has been bothersome for 6 months.    Past Medical History  Diagnosis Date  . Anemia   . Coronary artery disease   . Hypertension   . E coli enteritis   . Head injury   . Iron deficiency anemia, unspecified 02/22/2013  . Bleeding diathesis 02/22/2013  . Radicular pain of thoracic region 08/22/2013    Left T-8 started after pushing heavy chest 1/15; intermittent; no focal neuro deficit  . Mass of right kidney 08/22/2013    2.5 cm solid lesion seen on 5/14 CT  . GERD (gastroesophageal reflux disease)   . H/O hiatal hernia   . Arthritis     Past Surgical History  Procedure Laterality Date  . Abdominal hysterectomy    . Tonsillectomy    . Facial fracture surgery  2000  . Breast surgery  1981    rt-nodes taken out-non cancer  . Breast surgery  1981    rt lumpectomy  . Breast reduction surgery  1981  . Colonoscopy      History reviewed. No pertinent family history. Social History:  reports that she has never smoked. She does not have any smokeless tobacco history on file. She reports that she drinks alcohol. She reports that she does not use illicit drugs.  Allergies:  Allergies  Allergen Reactions  . Cortisone Anaphylaxis    Some forms of cortisone  . Prednisone Anaphylaxis    confusion  . Demerol [Meperidine]   . Morphine And Related Other (See Comments)    Vomiting, shaking, & elevates BP  . Sulfa Antibiotics     No prescriptions prior to admission    No results found for  this or any previous visit (from the past 48 hour(s)).  No results found.   Pertinent items are noted in HPI.  Height 5\' 5"  (1.651 m), weight 83.008 kg (183 lb).  General appearance: alert Head: Normocephalic, without obvious abnormality Neck: supple, symmetrical, trachea midline Resp: clear to auscultation bilaterally Cardio: regular rate and rhythm GI: normal findings: bowel sounds normal Extremities:Inspection of her hands reveals the stigmata of osteoarthritis including Heberden's and Bouchard's nodes. She has swelling over her right thumb CMC joint consistent with some capsular thickening. She has active triggering of her right long finger in flexion. She has full ROM of her right thumb, index, ring and small fingers. She has full ROM of all fingers and thumb on the left. Her pulses and capillary refill are intact. Her motor and sensory exam is intact.   X-rays of her right hand AP, lateral views of the hand and carpus and a tangential Caitlyn [name] view of the thumb demonstrates Eaton stage I changes at her thumb CMC joint, narrowing of the STT joint. She has marginal osteophyte at multiple DIP joints.   Pulses: 2+ and symmetric Skin: normal Neurologic: Grossly normal     Assessment/Plan Impression: Chronic STS right long finger  Plan: To the OR for release A-1 pulley right long finger.The procedure, risks,benefits and  post-op course were discussed with the patient at length and they were in agreement with the plan.  Caitlyn [name],Caitlyn [name] 11/27/2013, 1:47 PM  H&P documentation: 11/28/2013  -History and Physical Reviewed  -Patient has been re-examined  -No change in the plan of care  Cammie Sickle, MD

## 2013-11-27 NOTE — Progress Notes (Signed)
To come in for ekg-bmet-saw dr berry 2 yr ago for angina and family hx cad-called for notes

## 2013-11-28 ENCOUNTER — Encounter (HOSPITAL_BASED_OUTPATIENT_CLINIC_OR_DEPARTMENT_OTHER): Payer: Self-pay | Admitting: Orthopedic Surgery

## 2013-11-28 ENCOUNTER — Encounter (HOSPITAL_BASED_OUTPATIENT_CLINIC_OR_DEPARTMENT_OTHER)
Admission: RE | Disposition: A | Discharge status: Home / Self Care | Payer: Self-pay | Source: Ambulatory Visit | Attending: Orthopedic Surgery

## 2013-11-28 ENCOUNTER — Ambulatory Visit (HOSPITAL_BASED_OUTPATIENT_CLINIC_OR_DEPARTMENT_OTHER): Payer: Medicare Other | Admitting: Certified Registered"

## 2013-11-28 ENCOUNTER — Ambulatory Visit (HOSPITAL_BASED_OUTPATIENT_CLINIC_OR_DEPARTMENT_OTHER)
Admission: RE | Admit: 2013-11-28 | Discharge: 2013-11-28 | Disposition: A | Discharge status: Home / Self Care | Payer: Medicare Other | Source: Ambulatory Visit | Attending: Orthopedic Surgery | Admitting: Orthopedic Surgery

## 2013-11-28 ENCOUNTER — Encounter (HOSPITAL_BASED_OUTPATIENT_CLINIC_OR_DEPARTMENT_OTHER): Payer: Medicare Other | Admitting: Certified Registered"

## 2013-11-28 DIAGNOSIS — I10 Essential (primary) hypertension: Secondary | ICD-10-CM | POA: Insufficient documentation

## 2013-11-28 DIAGNOSIS — I2510 Atherosclerotic heart disease of native coronary artery without angina pectoris: Secondary | ICD-10-CM | POA: Insufficient documentation

## 2013-11-28 DIAGNOSIS — F1010 Alcohol abuse, uncomplicated: Secondary | ICD-10-CM | POA: Insufficient documentation

## 2013-11-28 DIAGNOSIS — M729 Fibroblastic disorder, unspecified: Secondary | ICD-10-CM | POA: Diagnosis not present

## 2013-11-28 DIAGNOSIS — Z01812 Encounter for preprocedural laboratory examination: Secondary | ICD-10-CM | POA: Insufficient documentation

## 2013-11-28 DIAGNOSIS — K219 Gastro-esophageal reflux disease without esophagitis: Secondary | ICD-10-CM | POA: Insufficient documentation

## 2013-11-28 DIAGNOSIS — M129 Arthropathy, unspecified: Secondary | ICD-10-CM | POA: Insufficient documentation

## 2013-11-28 DIAGNOSIS — M6530 Trigger finger, unspecified finger: Secondary | ICD-10-CM | POA: Insufficient documentation

## 2013-11-28 DIAGNOSIS — Z862 Personal history of diseases of the blood and blood-forming organs and certain disorders involving the immune mechanism: Secondary | ICD-10-CM | POA: Insufficient documentation

## 2013-11-28 DIAGNOSIS — M720 Palmar fascial fibromatosis [Dupuytren]: Secondary | ICD-10-CM | POA: Insufficient documentation

## 2013-11-28 DIAGNOSIS — Z01810 Encounter for preprocedural cardiovascular examination: Secondary | ICD-10-CM | POA: Insufficient documentation

## 2013-11-28 HISTORY — DX: Unspecified osteoarthritis, unspecified site: M19.90

## 2013-11-28 HISTORY — PX: TRIGGER FINGER RELEASE: SHX641

## 2013-11-28 HISTORY — DX: Gastro-esophageal reflux disease without esophagitis: K21.9

## 2013-11-28 HISTORY — DX: Personal history of other diseases of the digestive system: Z87.19

## 2013-11-28 LAB — POCT HEMOGLOBIN-HEMACUE: Hemoglobin: 14.5 g/dL (ref 12.0–15.0)

## 2013-11-28 SURGERY — RELEASE, A1 PULLEY, FOR TRIGGER FINGER
Anesthesia: Monitor Anesthesia Care | Site: Finger | Laterality: Right

## 2013-11-28 MED ORDER — LIDOCAINE HCL (CARDIAC) 20 MG/ML IV SOLN
INTRAVENOUS | Status: DC | PRN
  Administered 2013-11-28: 25 mg via INTRAVENOUS

## 2013-11-28 MED ORDER — FENTANYL CITRATE 0.05 MG/ML IJ SOLN: 25.0000 ug | INTRAMUSCULAR | Status: DC | PRN

## 2013-11-28 MED ORDER — MIDAZOLAM HCL 2 MG/2ML IJ SOLN
INTRAMUSCULAR | Status: AC
  Filled 2013-11-28: qty 2

## 2013-11-28 MED ORDER — LIDOCAINE HCL 2 % IJ SOLN
INTRAMUSCULAR | Status: DC | PRN
  Administered 2013-11-28: 3 mL

## 2013-11-28 MED ORDER — ONDANSETRON HCL 4 MG/2ML IJ SOLN: 4.0000 mg | Freq: Four times a day (QID) | INTRAMUSCULAR | Status: DC | PRN

## 2013-11-28 MED ORDER — OXYCODONE HCL 5 MG/5ML PO SOLN: 5.0000 mg | Freq: Once | ORAL | Status: DC | PRN

## 2013-11-28 MED ORDER — CHLORHEXIDINE GLUCONATE 4 % EX LIQD: 60.0000 mL | Freq: Once | CUTANEOUS | Status: DC

## 2013-11-28 MED ORDER — OXYCODONE HCL 5 MG PO TABS: 5.0000 mg | ORAL_TABLET | Freq: Once | ORAL | Status: DC | PRN

## 2013-11-28 MED ORDER — FENTANYL CITRATE 0.05 MG/ML IJ SOLN
INTRAMUSCULAR | Status: AC
  Filled 2013-11-28: qty 2

## 2013-11-28 MED ORDER — PROPOFOL 10 MG/ML IV EMUL
INTRAVENOUS | Status: AC
  Filled 2013-11-28: qty 50

## 2013-11-28 MED ORDER — LIDOCAINE HCL 2 % IJ SOLN
INTRAMUSCULAR | Status: AC
  Filled 2013-11-28: qty 20

## 2013-11-28 MED ORDER — PROPOFOL INFUSION 10 MG/ML OPTIME
INTRAVENOUS | Status: DC | PRN
  Administered 2013-11-28: 100 ug/kg/min via INTRAVENOUS

## 2013-11-28 MED ORDER — LACTATED RINGERS IV SOLN
INTRAVENOUS | Status: DC
  Administered 2013-11-28: 08:00:00 via INTRAVENOUS

## 2013-11-28 MED ORDER — PROPOFOL 10 MG/ML IV BOLUS
INTRAVENOUS | Status: DC | PRN
  Administered 2013-11-28: 30 mg via INTRAVENOUS

## 2013-11-28 MED ORDER — FENTANYL CITRATE 0.05 MG/ML IJ SOLN: 50.0000 ug | INTRAMUSCULAR | Status: DC | PRN

## 2013-11-28 MED ORDER — TRAMADOL HCL 50 MG PO TABS: 50.0000 mg | ORAL_TABLET | Freq: Four times a day (QID) | ORAL | Status: DC | PRN

## 2013-11-28 MED ORDER — MIDAZOLAM HCL 2 MG/2ML IJ SOLN: 1.0000 mg | INTRAMUSCULAR | Status: DC | PRN

## 2013-11-28 SURGICAL SUPPLY — 42 items
BANDAGE ADH SHEER 1  50/CT (GAUZE/BANDAGES/DRESSINGS) IMPLANT
BANDAGE COBAN STERILE 2 (GAUZE/BANDAGES/DRESSINGS) ×3 IMPLANT
BLADE SURG 15 STRL LF DISP TIS (BLADE) ×1 IMPLANT
BLADE SURG 15 STRL SS (BLADE) ×3
BNDG CMPR 9X4 STRL LF SNTH (GAUZE/BANDAGES/DRESSINGS) ×1
BNDG ESMARK 4X9 LF (GAUZE/BANDAGES/DRESSINGS) ×2 IMPLANT
BRUSH SCRUB EZ PLAIN DRY (MISCELLANEOUS) ×3 IMPLANT
CLOSURE WOUND 1/2 X4 (GAUZE/BANDAGES/DRESSINGS)
CORDS BIPOLAR (ELECTRODE) IMPLANT
COVER MAYO STAND STRL (DRAPES) ×3 IMPLANT
COVER TABLE BACK 60X90 (DRAPES) ×3 IMPLANT
CUFF TOURNIQUET SINGLE 18IN (TOURNIQUET CUFF) ×2 IMPLANT
DECANTER SPIKE VIAL GLASS SM (MISCELLANEOUS) ×2 IMPLANT
DRAPE EXTREMITY T 121X128X90 (DRAPE) ×3 IMPLANT
DRAPE SURG 17X23 STRL (DRAPES) ×3 IMPLANT
GAUZE SPONGE 4X4 12PLY STRL (GAUZE/BANDAGES/DRESSINGS) ×3 IMPLANT
GLOVE BIO SURGEON STRL SZ 6.5 (GLOVE) ×1 IMPLANT
GLOVE BIO SURGEONS STRL SZ 6.5 (GLOVE) ×1
GLOVE BIOGEL M STRL SZ7.5 (GLOVE) ×1 IMPLANT
GLOVE BIOGEL PI IND STRL 7.0 (GLOVE) IMPLANT
GLOVE BIOGEL PI INDICATOR 7.0 (GLOVE) ×4
GLOVE ORTHO TXT STRL SZ7.5 (GLOVE) ×3 IMPLANT
GOWN STRL REUS W/ TWL LRG LVL3 (GOWN DISPOSABLE) ×1 IMPLANT
GOWN STRL REUS W/ TWL XL LVL3 (GOWN DISPOSABLE) ×2 IMPLANT
GOWN STRL REUS W/TWL LRG LVL3 (GOWN DISPOSABLE) ×3
GOWN STRL REUS W/TWL XL LVL3 (GOWN DISPOSABLE) ×3
NEEDLE 27GAX1X1/2 (NEEDLE) ×2 IMPLANT
PACK BASIN DAY SURGERY FS (CUSTOM PROCEDURE TRAY) ×3 IMPLANT
PADDING CAST ABS 4INX4YD NS (CAST SUPPLIES) ×2
PADDING CAST ABS COTTON 4X4 ST (CAST SUPPLIES) ×1 IMPLANT
STOCKINETTE 4X48 STRL (DRAPES) ×3 IMPLANT
STRIP CLOSURE SKIN 1/2X4 (GAUZE/BANDAGES/DRESSINGS) IMPLANT
SUT ETHILON 5 0 P 3 18 (SUTURE)
SUT NYLON ETHILON 5-0 P-3 1X18 (SUTURE) IMPLANT
SUT PROLENE 3 0 PS 2 (SUTURE) IMPLANT
SUT PROLENE 4 0 P 3 18 (SUTURE) IMPLANT
SYR 3ML 23GX1 SAFETY (SYRINGE) IMPLANT
SYRINGE CONTROL L 12CC (SYRINGE) ×3 IMPLANT
SYRINGE CONTROL LL 12CC (SYRINGE) IMPLANT
TOWEL OR 17X24 6PK STRL BLUE (TOWEL DISPOSABLE) ×3 IMPLANT
TRAY DSU PREP LF (CUSTOM PROCEDURE TRAY) ×1 IMPLANT
UNDERPAD 30X30 INCONTINENT (UNDERPADS AND DIAPERS) ×3 IMPLANT

## 2013-11-28 NOTE — Discharge Instructions (Addendum)

## 2013-11-28 NOTE — Brief Op Note (Signed)
11/28/2013  9:02 AM  PATIENT:  Caitlyn [name]  68 y.o. female  PRE-OPERATIVE DIAGNOSIS:  CHRONIC STENOSIS TENOSYNOVITIS RIGHT LONG FINGER, WITH EARLY PALMAR FIBROMATOSIS  POST-OPERATIVE DIAGNOSIS:  chronic stenosis tenosynovitis right long finger, early palmar fibromatosis  PROCEDURE:  Procedure(s): RELEASE TRIGGER FINGER/A-1 PULLEY RIGHT LONG FINGER (Right)  SURGEON:  Surgeon(s) and Role:    * Cammie Sickle, MD - Primary  PHYSICIAN ASSISTANT:   ASSISTANTS: SURGICAL TECH   ANESTHESIA:   MAC  EBL:  Total I/O In: 600 [I.V.:600] Out: -   BLOOD ADMINISTERED:none  DRAINS: none   LOCAL MEDICATIONS USED:  LIDOCAINE   SPECIMEN:  No Specimen  DISPOSITION OF SPECIMEN:  N/A  COUNTS:  YES  TOURNIQUET:   Total Tourniquet Time Documented: Upper Arm (Right) - 8 minutes Total: Upper Arm (Right) - 8 minutes   DICTATION: .Other Dictation: Dictation Number [id number]  PLAN OF CARE: Discharge to home after PACU  PATIENT DISPOSITION:  PACU - hemodynamically stable.   Delay start of Pharmacological VTE agent (>24hrs) due to surgical blood loss or risk of bleeding: not applicable

## 2013-11-28 NOTE — Anesthesia Procedure Notes (Signed)
Procedure Name: MAC Date/Time: 11/28/2013 8:34 AM Performed by: Baxter Flattery Pre-anesthesia Checklist: Patient identified, Emergency Drugs available, Suction available and Patient being monitored Patient Re-evaluated:Patient Re-evaluated prior to inductionOxygen Delivery Method: Simple face mask Preoxygenation: Pre-oxygenation with 100% oxygen Ventilation: Mask ventilation without difficulty

## 2013-11-28 NOTE — Anesthesia Postprocedure Evaluation (Signed)
Anesthesia Post Note  Patient: Caitlyn [name]  Procedure(s) Performed: Procedure(s) (LRB): RELEASE TRIGGER FINGER/A-1 PULLEY RIGHT LONG FINGER (Right)  Anesthesia type: MAC  Patient location: PACU  Post pain: Pain level controlled and Adequate analgesia  Post assessment: Post-op Vital signs reviewed, Patient's Cardiovascular Status Stable and Respiratory Function Stable  Last Vitals:  Filed Vitals:   11/28/13 1004  BP: 160/67  Pulse: 66  Temp: 36.4 C  Resp: 18    Post vital signs: Reviewed and stable  Level of consciousness: awake, alert  and oriented  Complications: No apparent anesthesia complications

## 2013-11-28 NOTE — Anesthesia Preprocedure Evaluation (Signed)
Anesthesia Evaluation  Patient identified by MRN, date of birth, ID band Patient awake    Reviewed: Allergy & Precautions, H&P , NPO status , Patient's Chart, lab work & pertinent test results  Airway Mallampati: II  Neck ROM: full    Dental   Pulmonary neg pulmonary ROS,          Cardiovascular hypertension, + CAD     Neuro/Psych    GI/Hepatic hiatal hernia, GERD-  ,  Endo/Other    Renal/GU      Musculoskeletal  (+) Arthritis -,   Abdominal   Peds  Hematology   Anesthesia Other Findings   Reproductive/Obstetrics                           Anesthesia Physical Anesthesia Plan  ASA: III  Anesthesia Plan: MAC   Post-op Pain Management:    Induction: Intravenous  Airway Management Planned: Simple Face Mask  Additional Equipment:   Intra-op Plan:   Post-operative Plan:   Informed Consent: I have reviewed the patients History and Physical, chart, labs and discussed the procedure including the risks, benefits and alternatives for the proposed anesthesia with the patient or authorized representative who has indicated his/her understanding and acceptance.     Plan Discussed with: CRNA, Anesthesiologist and Surgeon  Anesthesia Plan Comments:         Anesthesia Quick Evaluation

## 2013-11-28 NOTE — Transfer of Care (Signed)
Immediate Anesthesia Transfer of Care Note  Patient: Caitlyn [name]  Procedure(s) Performed: Procedure(s): RELEASE TRIGGER FINGER/A-1 PULLEY RIGHT LONG FINGER (Right)  Patient Location: PACU  Anesthesia Type:MAC  Level of Consciousness: awake, alert  and oriented  Airway & Oxygen Therapy: Patient Spontanous Breathing and Patient connected to face mask oxygen  Post-op Assessment: Report given to PACU RN, Post -op Vital signs reviewed and stable and Patient moving all extremities  Post vital signs: Reviewed and stable  Complications: No apparent anesthesia complications

## 2013-11-28 NOTE — Op Note (Signed)
NAMEJONETTA, DAGLEY NO.:  [id number]  MEDICAL RECORD NO.:  [id number]  LOCATION:  [facility]S                         FACILITY:  [facility]  PHYSICIAN:  Youlanda Mighty. Yvonna Brun, M.D. DATE OF BIRTH:  02-Jan-1946  DATE OF PROCEDURE:  11/28/2013 DATE OF DISCHARGE:  08/29/2013                              OPERATIVE REPORT   PREOPERATIVE DIAGNOSES:  Chronic stenosing tenosynovitis, right long finger at A1 pulley with a 10-degree flexion, contracture of PIP joint, and significant early palmar fibromatosis.  POSTOPERATIVE DIAGNOSIS:  Chronic stenosing tenosynovitis, right long finger at A1 pulley with a 10-degree flexion, contracture of PIP joint, and significant early palmar fibromatosis.  OPERATION:  Release of right long finger A1 pulley with incidental synovectomy and incidental resection of significant palmar fibromatosis, pretendinous fibers of palmar fascia, right palm.  OPERATING SURGEON:  Youlanda Mighty. Bernarda Erck, MD.  ASSISTANT:  Surgical technician.  ANESTHESIA:  Monitored anesthesia care with IV sedation utilizing propofol and 2% lidocaine field block and flexor sheath block, right long finger. Supervised anesthesiologist is Albertha Ghee, MD.  INDICATIONS:  Caitlyn [name] is a 68 year old woman who was referred from [place], [place] for evaluation and management of chronic right long finger stenosing tenosynovitis.  She has a severe ALLERGY TO CORTISONE, therefore she was not a candidate for injection therapy for her trigger finger.  She was noted to have early Dupuytren palmar fibromatosis and was informed of the relationship between palmar fibromatosis and trigger fingers.  She requested that we proceed with release of her A1 pulley.  After detailed informed consent, she is brought to the operating at this time. Preoperatively, we noted a number of very complex allergies that she has.  SHE IS PROFOUNDLY ALLERGIC TO SHELL FISH, IODINE, SULFITES, MORPHINE, DEMEROL,  AMLODIPINE, AND IS INTOLERANT OF STATINS.  After studying this list of allergies in consultation with our attending anesthesiologist Dr. Marcie Bal, we recommended proceeding with local anesthesia utilizing 2% lidocaine and IV propofol sedation.  Detailed anesthesia informed consent was conducted in the holding area.  Ms. Valenti right long finger and hand were marked as the proper surgical site per protocol with the marking pen.  Questions regarding the anticipated procedure as well as aftercare, potential risks and benefits were discussed in the holding area.  PROCEDURE IN DETAIL:  Caitlyn [name] was transferred to room #2 of the [facility] and placed supine position on the operating table.  Following IV sedation with propofol, the right hand was prepped with alcohol and Hibiclens avoiding iodine products.  The area of intended incision and the flexor sheath of the right long finger were infiltrated with 2% lidocaine without epinephrine, total volume 2 mL.  After few moments, excellent anesthesia of the long finger and flexor sheath was achieved.  The right hand and arm were then prepped with Hibiclens and draped with sterile stockinette and impervious arthroscopy drapes.  Following exsanguination of the right hand and arm with Esmarch bandage, the arterial tourniquet on the proximal right brachium was inflated to 220 mmHg.  Procedure commenced with an oblique incision in the distal palmar crease.  Subcutaneous tissues were carefully divided taking care to identify a complex constellation of palmar fibromatosis cords.  The pretendinous fibers were resected as were multiple perforating cords from the deep fascia of the intrinsic muscles to the pretendinous fibers.  The A1 pulley was isolated, then subsequently split with scalpel and scissors.  The flexor tendons were delivered and a cuff of inflammatory tenosynovium removed with a micro rongeur.  Thereafter, full passive  range of motion of the PIP joint was recovered 0-120 degrees.  The wound was inspected for bleeding points, subsequently repaired with intradermal 4-0 Prolene and Steri-Strip.  A compressive dressing was applied with sterile gauze and Coban.  No complications were noted.  For aftercare, Ms. Glodowski is provided a prescription for tramadol 50 mg 1 p.o. q.6 hours p.r.n. pain, 20 tablets without refill.  We will ask her to use Tylenol as a primary analgesic.     Youlanda Mighty Kinsleigh Ludolph, M.D.     RVS/MEDQ  D:  11/28/2013  T:  11/28/2013  Job:  [id number]

## 2013-11-28 NOTE — Op Note (Signed)
[id number] 

## 2013-11-29 ENCOUNTER — Encounter (HOSPITAL_BASED_OUTPATIENT_CLINIC_OR_DEPARTMENT_OTHER): Payer: Self-pay | Admitting: Orthopedic Surgery

## 2013-12-06 ENCOUNTER — Telehealth: Payer: Self-pay | Admitting: *Deleted

## 2013-12-06 NOTE — Telephone Encounter (Signed)
Pt requested MRI be changed from [facility] to [facility]; this was done w/same date 10/23 also labs will be done here @ [facility] prior to MRI.

## 2013-12-11 ENCOUNTER — Other Ambulatory Visit (INDEPENDENT_AMBULATORY_CARE_PROVIDER_SITE_OTHER): Payer: Medicare Other

## 2013-12-11 DIAGNOSIS — D509 Iron deficiency anemia, unspecified: Secondary | ICD-10-CM

## 2013-12-11 LAB — CBC WITH DIFFERENTIAL/PLATELET
BASOS ABS: 0 10*3/uL (ref 0.0–0.1)
Basophils Relative: 0 % (ref 0–1)
EOS PCT: 1 % (ref 0–5)
Eosinophils Absolute: 0.1 10*3/uL (ref 0.0–0.7)
HCT: 38.3 % (ref 36.0–46.0)
Hemoglobin: 13 g/dL (ref 12.0–15.0)
LYMPHS ABS: 2.2 10*3/uL (ref 0.7–4.0)
LYMPHS PCT: 25 % (ref 12–46)
MCH: 29.5 pg (ref 26.0–34.0)
MCHC: 33.9 g/dL (ref 30.0–36.0)
MCV: 87 fL (ref 78.0–100.0)
MONO ABS: 0.5 10*3/uL (ref 0.1–1.0)
Monocytes Relative: 6 % (ref 3–12)
Neutro Abs: 6 10*3/uL (ref 1.7–7.7)
Neutrophils Relative %: 68 % (ref 43–77)
Platelets: 228 10*3/uL (ref 150–400)
RBC: 4.4 MIL/uL (ref 3.87–5.11)
RDW: 14.2 % (ref 11.5–15.5)
WBC: 8.8 10*3/uL (ref 4.0–10.5)

## 2013-12-12 LAB — FERRITIN: Ferritin: 107 ng/mL (ref 10–291)

## 2013-12-15 ENCOUNTER — Telehealth: Payer: Self-pay | Admitting: *Deleted

## 2013-12-15 NOTE — Progress Notes (Signed)
Talked with pt about lab results per Dr Beryle Beams. Pt aware of appt 03/27/14 9:45AM with Dr Beryle Beams.

## 2013-12-15 NOTE — Telephone Encounter (Signed)
Pt called clinic - has had "diarrhea" past 5 days. Pt states she has soft stool mixed with watery material about 30 minutes after she eats. Dr Beryle Beams aware to drink plenty of fluids and take Imodium. Pt aware of OTC med. To call clinic if no change. Hilda Blades Dre Gamino RN 12/15/13 12N

## 2013-12-20 DIAGNOSIS — I10 Essential (primary) hypertension: Secondary | ICD-10-CM | POA: Diagnosis not present

## 2013-12-20 DIAGNOSIS — D643 Other sideroblastic anemias: Secondary | ICD-10-CM | POA: Diagnosis not present

## 2013-12-20 DIAGNOSIS — M940 Chondrocostal junction syndrome [Tietze]: Secondary | ICD-10-CM | POA: Diagnosis not present

## 2013-12-20 DIAGNOSIS — E669 Obesity, unspecified: Secondary | ICD-10-CM | POA: Diagnosis not present

## 2013-12-20 DIAGNOSIS — R7309 Other abnormal glucose: Secondary | ICD-10-CM | POA: Diagnosis not present

## 2013-12-20 DIAGNOSIS — Z6831 Body mass index (BMI) 31.0-31.9, adult: Secondary | ICD-10-CM | POA: Diagnosis not present

## 2013-12-20 DIAGNOSIS — D509 Iron deficiency anemia, unspecified: Secondary | ICD-10-CM | POA: Diagnosis not present

## 2013-12-20 DIAGNOSIS — Z0000 Encounter for general adult medical examination without abnormal findings: Secondary | ICD-10-CM | POA: Diagnosis not present

## 2013-12-20 DIAGNOSIS — R197 Diarrhea, unspecified: Secondary | ICD-10-CM | POA: Diagnosis not present

## 2013-12-25 ENCOUNTER — Ambulatory Visit
Admission: RE | Admit: 2013-12-25 | Discharge: 2013-12-25 | Disposition: A | Discharge status: Home / Self Care | Payer: Medicare Other | Source: Ambulatory Visit

## 2013-12-25 ENCOUNTER — Ambulatory Visit: Payer: Medicare Other

## 2013-12-25 DIAGNOSIS — Z9889 Other specified postprocedural states: Secondary | ICD-10-CM

## 2013-12-25 DIAGNOSIS — Z1231 Encounter for screening mammogram for malignant neoplasm of breast: Secondary | ICD-10-CM

## 2014-01-31 DIAGNOSIS — E669 Obesity, unspecified: Secondary | ICD-10-CM | POA: Diagnosis not present

## 2014-01-31 DIAGNOSIS — I10 Essential (primary) hypertension: Secondary | ICD-10-CM | POA: Diagnosis not present

## 2014-01-31 DIAGNOSIS — Z23 Encounter for immunization: Secondary | ICD-10-CM | POA: Diagnosis not present

## 2014-01-31 DIAGNOSIS — R7309 Other abnormal glucose: Secondary | ICD-10-CM | POA: Diagnosis not present

## 2014-01-31 DIAGNOSIS — E785 Hyperlipidemia, unspecified: Secondary | ICD-10-CM | POA: Diagnosis not present

## 2014-01-31 DIAGNOSIS — R197 Diarrhea, unspecified: Secondary | ICD-10-CM | POA: Diagnosis not present

## 2014-01-31 DIAGNOSIS — Z6830 Body mass index (BMI) 30.0-30.9, adult: Secondary | ICD-10-CM | POA: Diagnosis not present

## 2014-01-31 DIAGNOSIS — M720 Palmar fascial fibromatosis [Dupuytren]: Secondary | ICD-10-CM | POA: Diagnosis not present

## 2014-01-31 DIAGNOSIS — D649 Anemia, unspecified: Secondary | ICD-10-CM | POA: Diagnosis not present

## 2014-03-02 ENCOUNTER — Other Ambulatory Visit: Payer: Medicare Other

## 2014-03-02 ENCOUNTER — Ambulatory Visit (HOSPITAL_COMMUNITY): Payer: Medicare Other

## 2014-03-02 ENCOUNTER — Ambulatory Visit (HOSPITAL_COMMUNITY): Admission: RE | Admit: 2014-03-02 | Payer: Medicare Other | Source: Ambulatory Visit

## 2014-03-13 ENCOUNTER — Ambulatory Visit (HOSPITAL_COMMUNITY)
Admission: RE | Admit: 2014-03-13 | Discharge: 2014-03-13 | Disposition: A | Discharge status: Home / Self Care | Payer: Medicare Other | Source: Ambulatory Visit | Attending: Oncology | Admitting: Oncology

## 2014-03-13 DIAGNOSIS — N289 Disorder of kidney and ureter, unspecified: Secondary | ICD-10-CM | POA: Insufficient documentation

## 2014-03-13 DIAGNOSIS — K449 Diaphragmatic hernia without obstruction or gangrene: Secondary | ICD-10-CM | POA: Diagnosis not present

## 2014-03-13 DIAGNOSIS — N2889 Other specified disorders of kidney and ureter: Secondary | ICD-10-CM | POA: Diagnosis not present

## 2014-03-13 LAB — CREATININE, SERUM: Creatinine, Ser: 0.6 mg/dL (ref 0.50–1.10)

## 2014-03-13 MED ORDER — GADOBENATE DIMEGLUMINE 529 MG/ML IV SOLN
17.0000 mL | Freq: Once | INTRAVENOUS | Status: AC | PRN
  Administered 2014-03-13: 17 mL via INTRAVENOUS

## 2014-03-16 ENCOUNTER — Other Ambulatory Visit (INDEPENDENT_AMBULATORY_CARE_PROVIDER_SITE_OTHER): Payer: Medicare Other

## 2014-03-16 ENCOUNTER — Other Ambulatory Visit: Payer: Medicare Other

## 2014-03-16 DIAGNOSIS — N2889 Other specified disorders of kidney and ureter: Secondary | ICD-10-CM | POA: Diagnosis not present

## 2014-03-16 DIAGNOSIS — D509 Iron deficiency anemia, unspecified: Secondary | ICD-10-CM | POA: Diagnosis not present

## 2014-03-16 DIAGNOSIS — N289 Disorder of kidney and ureter, unspecified: Secondary | ICD-10-CM | POA: Diagnosis not present

## 2014-03-16 LAB — BASIC METABOLIC PANEL WITH GFR
BUN: 16 mg/dL (ref 6–23)
CHLORIDE: 101 meq/L (ref 96–112)
CO2: 24 meq/L (ref 19–32)
Calcium: 9.1 mg/dL (ref 8.4–10.5)
Creat: 0.67 mg/dL (ref 0.50–1.10)
GFR, EST NON AFRICAN AMERICAN: 89 mL/min
GFR, Est African American: >89 mL/min
Glucose, Bld: 158 mg/dL — ABNORMAL HIGH (ref 70–99)
POTASSIUM: 4.1 meq/L (ref 3.5–5.3)
Sodium: 141 mEq/L (ref 135–145)

## 2014-03-16 LAB — FERRITIN: Ferritin: 40 ng/mL (ref 10–291)

## 2014-03-16 LAB — CBC WITH DIFFERENTIAL/PLATELET
Basophils Absolute: 0 10*3/uL (ref 0.0–0.1)
Basophils Relative: 0 % (ref 0–1)
Eosinophils Absolute: 0.1 10*3/uL (ref 0.0–0.7)
Eosinophils Relative: 2 % (ref 0–5)
HEMATOCRIT: 36.9 % (ref 36.0–46.0)
HEMOGLOBIN: 12.4 g/dL (ref 12.0–15.0)
LYMPHS PCT: 31 % (ref 12–46)
Lymphs Abs: 2.1 10*3/uL (ref 0.7–4.0)
MCH: 29.8 pg (ref 26.0–34.0)
MCHC: 33.6 g/dL (ref 30.0–36.0)
MCV: 88.7 fL (ref 78.0–100.0)
MONO ABS: 0.4 10*3/uL (ref 0.1–1.0)
MONOS PCT: 6 % (ref 3–12)
Neutro Abs: 4.1 10*3/uL (ref 1.7–7.7)
Neutrophils Relative %: 61 % (ref 43–77)
Platelets: 252 10*3/uL (ref 150–400)
RBC: 4.16 MIL/uL (ref 3.87–5.11)
RDW: 13.3 % (ref 11.5–15.5)
WBC: 6.7 10*3/uL (ref 4.0–10.5)

## 2014-03-19 ENCOUNTER — Telehealth: Payer: Self-pay | Admitting: *Deleted

## 2014-03-19 NOTE — Telephone Encounter (Signed)
Called pt - no answer; left message "small spot on right kidney has not grown but Radilologist says we should continue to monitor. We can get a follow up in 6 months then yearly intervals if stable". And if have questions, to call me back at the office.

## 2014-03-19 NOTE — Telephone Encounter (Signed)
-----   Message from Annia Belt, MD sent at 03/16/2014 11:10 AM EST ----- Call pt: small spot on right kidney has not grown but Radilologist says we should continue to monitor.  We can get a follow up in 6 months then yearly intervals if stable

## 2014-03-27 ENCOUNTER — Encounter: Payer: Self-pay | Admitting: Oncology

## 2014-03-27 ENCOUNTER — Ambulatory Visit (INDEPENDENT_AMBULATORY_CARE_PROVIDER_SITE_OTHER): Payer: Medicare Other | Admitting: Oncology

## 2014-03-27 VITALS — BP 143/85 | HR 74 | Temp 98.1°F | Ht 65.0 in | Wt 182.1 lb

## 2014-03-27 DIAGNOSIS — D509 Iron deficiency anemia, unspecified: Secondary | ICD-10-CM

## 2014-03-27 DIAGNOSIS — N2889 Other specified disorders of kidney and ureter: Secondary | ICD-10-CM

## 2014-03-27 NOTE — Patient Instructions (Signed)
Continue lab check on iron levels every 2 months Next CT scan to monitor spot on kidney Oct 02, 2014  lab 1 week before to make sure kidney function stable Continue to follow up with Dr Gaynelle Arabian

## 2014-03-27 NOTE — Progress Notes (Signed)
Hematology and Oncology Follow Up Visit  Caitlyn [name] [id number] 12/20/1945 68 y.o. 03/27/2014 3:51 PM   Principle Diagnosis: Encounter Diagnoses  Name Primary?  . Iron deficiency anemia Yes  . Mass of right kidney      Interim History:    Follow up visit for this pleasant 68 year old woman  . I initially saw her in October 2014 to evaluate iron deficiency anemia. She had a negative upper and lower endoscopy except for a hiatus hernia. She reported extreme intolerance to oral iron preparations. Hemoglobin got as low as 7 g. When I saw her on October 29 ferritin was 7. Hemoglobin 10.6. MCV 79. I believe this was a post transfusion value. She received a dose of parenteral iron in our office 1020 milligrams on 03/24/2013. Hemoglobin came up to a peak value of 12.1 g by 05/01/2013 and has remained stable with most  value of 11.9 with MCV 84 on 08/14/2013. Ferritin came up to a peak value of 123 by December 22  but  fell rapidly back  down to 10 by 08/14/2013 and another infusion of iron was given on April 9. Current labs done on November 6 show hemoglobin 12.4 MCV 89 compare with hemoglobin 13 on August 3. Ferritin is drifting down again from 107 on August 3 to 40 on November 6. She is asymptomatic at present. No hematochezia or melena.  Additional problem identified during her evaluation was a small, 1.3 cm lesion in her right kidney which enhanced. This was initially noted on a study done on 09/21/2013 and did not change on a study done on 08/29/2013. I obtained an MRI scan in anticipation of today's visit done on November 3. There are no other areas of abnormality and no interval growth of this lesion.  She had a Dupuytren's contracture of her right hand and underwent surgery on July 20.  She has had the reappearance of a bullous rash on the back of her neck. She was diagnosed with a variation of bullous pemphigoid about 10 years ago when she was living in [place]. It regressed with topical  treatment and has now recurred.    Medications: reviewed  Allergies:  Allergies  Allergen Reactions  . Cortisone Anaphylaxis    Some forms of cortisone  . Monosodium Glutamate Anaphylaxis  . Prednisone Anaphylaxis    confusion  . Shellfish Allergy Anaphylaxis  . Demerol [Meperidine]   . Morphine And Related Other (See Comments)    Vomiting, shaking, & elevates BP  . Sulfa Antibiotics     Review of Systems: See history of present illness Remaining ROS negative:   Physical Exam: Blood pressure 143/85, pulse 74, temperature 98.1 F (36.7 C), temperature source Oral, height 5\' 5"  (1.651 m), weight 182 lb 1.6 oz (82.6 kg), SpO2 96 %. Wt Readings from Last 3 Encounters:  03/27/14 182 lb 1.6 oz (82.6 kg)  11/28/13 181 lb (82.101 kg)  08/22/13 183 lb 12.8 oz (83.371 kg)     General appearance: well-nourished Caucasian woman HENNT: Pharynx no erythema, exudate, mass, or ulcer. No thyromegaly or thyroid nodules Lymph nodes: No cervical, supraclavicular, or axillary lymphadenopathy Breasts: Lungs: Clear to auscultation, resonant to percussion throughout Heart: Regular rhythm, no murmur, no gallop, no rub, no click, no edema Abdomen: Soft, nontender, normal bowel sounds, no mass, no organomegaly Extremities: No edema, no calf tenderness Musculoskeletal: no joint deformities GU:  Vascular: Carotid pulses 2+, no bruits,  Neurologic: Alert, oriented, PERRLA,  cranial nerves grossly normal, motor strength 5  over 5, reflexes 1+ symmetric, upper body coordination normal, gait normal, Skin: healing bullous rash back of neck  Lab Results: CBC W/Diff    Component Value Date/Time   WBC 6.7 03/16/2014 0914   WBC 8.0 08/14/2013 1318   RBC 4.16 03/16/2014 0914   RBC 4.42 08/14/2013 1318   HGB 12.4 03/16/2014 0914   HGB 11.9 08/14/2013 1318   HCT 36.9 03/16/2014 0914   HCT 36.9 08/14/2013 1318   PLT 252 03/16/2014 0914   PLT 233 08/14/2013 1318   MCV 88.7 03/16/2014 0914   MCV  83.7 08/14/2013 1318   MCH 29.8 03/16/2014 0914   MCH 27.0 08/14/2013 1318   MCHC 33.6 03/16/2014 0914   MCHC 32.3 08/14/2013 1318   RDW 13.3 03/16/2014 0914   RDW 14.5 08/14/2013 1318   LYMPHSABS 2.1 03/16/2014 0914   LYMPHSABS 2.0 08/14/2013 1318   MONOABS 0.4 03/16/2014 0914   MONOABS 0.6 08/14/2013 1318   EOSABS 0.1 03/16/2014 0914   EOSABS 0.3 08/14/2013 1318   BASOSABS 0.0 03/16/2014 0914   BASOSABS 0.1 08/14/2013 1318     Chemistry      Component Value Date/Time   NA 141 03/16/2014 0914   K 4.1 03/16/2014 0914   CL 101 03/16/2014 0914   CO2 24 03/16/2014 0914   BUN 16 03/16/2014 0914   CREATININE 0.67 03/16/2014 0914   CREATININE 0.60 03/13/2014 1310      Component Value Date/Time   CALCIUM 9.1 03/16/2014 0914   ALKPHOS 94 08/29/2013 0849   AST 19 08/29/2013 0849   ALT 14 08/29/2013 0849   BILITOT 0.3 08/29/2013 0849       Radiological Studies: Mr Abdomen W Wo Contrast  03/13/2014   CLINICAL DATA:  Followup indeterminate right renal lesion  EXAM: MRI ABDOMEN WITHOUT AND WITH CONTRAST  TECHNIQUE: Multiplanar multisequence MR imaging of the abdomen was performed both before and after the administration of intravenous contrast.  CONTRAST:  13mL MULTIHANCE GADOBENATE DIMEGLUMINE 529 MG/ML IV SOLN  COMPARISON:  CT 08/29/2013, 03/13/2013, 09/21/2012  FINDINGS: Renal: Along the medial border of the right mid kidney there is a small lesion which corresponds to the lesion of concern on comparison CT exams. This lesion is not changed significantly in size measuring 8 mm x 14 mm compared to 10 mm x 14 mm. Lesion continues to demonstrate mild post-contrast enhancement seen on image 27 of series 904 and 905.  No additional enhancing renal lesions.  No focal hepatic lesion. The gallbladder normal. The pancreas, spleen, adrenal glands normal. Large hiatal hernia noted. Aggressive osseous lesion. No lymphadenopathy.  IMPRESSION: 1. No change in size of small enhancing lesion along the  medial border of the right kidney. While the stability in volume over time is reassuring, enhancement pattern remains worrisome for a small neoplasm. Recommend continued surveillance. 2. Large hiatal hernia.   Electronically Signed   By: Suzy Bouchard M.D.   On: 03/13/2014 17:19    Impression:  #1. Intolerance to oral iron. Improvement in hemoglobin with trial of parenteral iron. However, ferritin has fallen back to previous levels just 5 months after the first 1 g dose of IV iron given in November 2014  and now ferritin falling again after second dose of iron given in April 2015. This suggests that we may be dealing with more than just iron malabsorption and that she may have some chronic GI blood loss with most likely etiology at her age with negative upper and lower endoscopy being AVMs of  the small bowel. Plan: I will continue periodic lab monitoring. I'm scheduling her for another dose of iron for December 8 to stay ahead of her requirements.  #2. Approximate left T8 radicular pain Possibly a pinched nerve related to trying to push a heavy object.  #3. Solid lesion right kidney incidentally noted on a CT scan of the abdomen and pelvis in May 2014. No change on serial studies since that time including recent November 2015 MRI. I reminded her that she needs to continue to follow-up with Dr. Gaynelle Arabian since the treatment of choice will be partial nephrectomy if this lesion changes at all in the future.  #4. Hiatus hernia with reflux  #5. Hypothyroid on replacement  #6. Mitral valve prolapse  #7. Asthma  #8. Flareup of bullous pemphigoid. She will contact [facility] for an appointment  #9. Status post repair right Dupuytren's contracture July 2015   CC: Patient Care Team: Josetta Huddle, MD as PCP - General (Internal Medicine) Annia Belt, MD as Consulting Physician (Oncology)   Annia Belt, MD 11/17/20153:51 PM

## 2014-04-24 DIAGNOSIS — H2513 Age-related nuclear cataract, bilateral: Secondary | ICD-10-CM | POA: Diagnosis not present

## 2014-04-24 DIAGNOSIS — H43813 Vitreous degeneration, bilateral: Secondary | ICD-10-CM | POA: Diagnosis not present

## 2014-04-27 ENCOUNTER — Ambulatory Visit (HOSPITAL_COMMUNITY): Payer: Medicare Other

## 2014-05-15 ENCOUNTER — Other Ambulatory Visit: Payer: Self-pay | Admitting: Oncology

## 2014-05-15 ENCOUNTER — Other Ambulatory Visit (INDEPENDENT_AMBULATORY_CARE_PROVIDER_SITE_OTHER): Payer: Medicare Other

## 2014-05-15 DIAGNOSIS — D509 Iron deficiency anemia, unspecified: Secondary | ICD-10-CM | POA: Diagnosis not present

## 2014-05-15 LAB — BASIC METABOLIC PANEL WITH GFR
BUN: 18 mg/dL (ref 6–23)
CHLORIDE: 106 meq/L (ref 96–112)
CO2: 26 meq/L (ref 19–32)
Calcium: 8.9 mg/dL (ref 8.4–10.5)
Creat: 0.72 mg/dL (ref 0.50–1.10)
GFR, Est African American: >89 mL/min
GFR, Est Non African American: 85 mL/min
GLUCOSE: 157 mg/dL — AB (ref 70–99)
POTASSIUM: 4.1 meq/L (ref 3.5–5.3)
SODIUM: 142 meq/L (ref 135–145)

## 2014-05-15 LAB — CBC WITH DIFFERENTIAL/PLATELET
Basophils Absolute: 0.1 10*3/uL (ref 0.0–0.1)
Basophils Relative: 1 % (ref 0–1)
EOS ABS: 0.2 10*3/uL (ref 0.0–0.7)
EOS PCT: 3 % (ref 0–5)
HEMATOCRIT: 36.6 % (ref 36.0–46.0)
HEMOGLOBIN: 11.8 g/dL — AB (ref 12.0–15.0)
LYMPHS ABS: 1.6 10*3/uL (ref 0.7–4.0)
Lymphocytes Relative: 26 % (ref 12–46)
MCH: 27.4 pg (ref 26.0–34.0)
MCHC: 32.2 g/dL (ref 30.0–36.0)
MCV: 85.1 fL (ref 78.0–100.0)
MONOS PCT: 6 % (ref 3–12)
MPV: 10.7 fL (ref 8.6–12.4)
Monocytes Absolute: 0.4 10*3/uL (ref 0.1–1.0)
NEUTROS PCT: 64 % (ref 43–77)
Neutro Abs: 4 10*3/uL (ref 1.7–7.7)
Platelets: 246 10*3/uL (ref 150–400)
RBC: 4.3 MIL/uL (ref 3.87–5.11)
RDW: 13.7 % (ref 11.5–15.5)
WBC: 6.3 10*3/uL (ref 4.0–10.5)

## 2014-05-15 LAB — FERRITIN: Ferritin: 11 ng/mL (ref 10–291)

## 2014-05-15 NOTE — Addendum Note (Signed)
Addended by: Ebbie Latus on: 05/15/2014 11:49 AM   Modules accepted: Orders

## 2014-05-17 ENCOUNTER — Ambulatory Visit (HOSPITAL_COMMUNITY): Payer: Medicare Other

## 2014-05-17 DIAGNOSIS — H35371 Puckering of macula, right eye: Secondary | ICD-10-CM | POA: Diagnosis not present

## 2014-05-17 DIAGNOSIS — H2512 Age-related nuclear cataract, left eye: Secondary | ICD-10-CM | POA: Diagnosis not present

## 2014-05-17 DIAGNOSIS — H43812 Vitreous degeneration, left eye: Secondary | ICD-10-CM | POA: Diagnosis not present

## 2014-05-17 DIAGNOSIS — H2511 Age-related nuclear cataract, right eye: Secondary | ICD-10-CM | POA: Diagnosis not present

## 2014-05-17 DIAGNOSIS — H43811 Vitreous degeneration, right eye: Secondary | ICD-10-CM | POA: Diagnosis not present

## 2014-05-28 DIAGNOSIS — J069 Acute upper respiratory infection, unspecified: Secondary | ICD-10-CM | POA: Diagnosis not present

## 2014-05-28 DIAGNOSIS — Z23 Encounter for immunization: Secondary | ICD-10-CM | POA: Diagnosis not present

## 2014-07-17 ENCOUNTER — Other Ambulatory Visit (INDEPENDENT_AMBULATORY_CARE_PROVIDER_SITE_OTHER): Payer: Medicare Other

## 2014-07-17 DIAGNOSIS — D509 Iron deficiency anemia, unspecified: Secondary | ICD-10-CM | POA: Diagnosis not present

## 2014-07-17 LAB — CBC WITH DIFFERENTIAL/PLATELET
Basophils Absolute: 0 10*3/uL (ref 0.0–0.1)
Basophils Relative: 0 % (ref 0–1)
EOS ABS: 0.1 10*3/uL (ref 0.0–0.7)
EOS PCT: 3 % (ref 0–5)
HCT: 36.8 % (ref 36.0–46.0)
Hemoglobin: 11.9 g/dL — ABNORMAL LOW (ref 12.0–15.0)
Lymphocytes Relative: 28 % (ref 12–46)
Lymphs Abs: 1.3 10*3/uL (ref 0.7–4.0)
MCH: 26 pg (ref 26.0–34.0)
MCHC: 32.3 g/dL (ref 30.0–36.0)
MCV: 80.3 fL (ref 78.0–100.0)
MPV: 10.6 fL (ref 8.6–12.4)
Monocytes Absolute: 0.3 10*3/uL (ref 0.1–1.0)
Monocytes Relative: 6 % (ref 3–12)
Neutro Abs: 3 10*3/uL (ref 1.7–7.7)
Neutrophils Relative %: 63 % (ref 43–77)
PLATELETS: 240 10*3/uL (ref 150–400)
RBC: 4.58 MIL/uL (ref 3.87–5.11)
RDW: 15.2 % (ref 11.5–15.5)
WBC: 4.7 10*3/uL (ref 4.0–10.5)

## 2014-07-17 LAB — FERRITIN: Ferritin: 10 ng/mL (ref 10–291)

## 2014-07-19 ENCOUNTER — Telehealth: Payer: Self-pay | Admitting: *Deleted

## 2014-07-19 NOTE — Telephone Encounter (Signed)
Pt called / informed her Hgb level stable @ 11.9 per Dr Beryle Beams. Wanted to know her iron level which is 10.

## 2014-07-19 NOTE — Telephone Encounter (Signed)
-----   Message from Annia Belt, MD sent at 07/18/2014  3:16 PM EST ----- Call pt hemoglobin stable @ 11.9

## 2014-07-27 DIAGNOSIS — M79605 Pain in left leg: Secondary | ICD-10-CM | POA: Diagnosis not present

## 2014-07-27 DIAGNOSIS — M25511 Pain in right shoulder: Secondary | ICD-10-CM | POA: Diagnosis not present

## 2014-08-16 DIAGNOSIS — H35371 Puckering of macula, right eye: Secondary | ICD-10-CM | POA: Diagnosis not present

## 2014-09-18 ENCOUNTER — Other Ambulatory Visit (INDEPENDENT_AMBULATORY_CARE_PROVIDER_SITE_OTHER): Payer: Medicare Other

## 2014-09-18 DIAGNOSIS — D509 Iron deficiency anemia, unspecified: Secondary | ICD-10-CM | POA: Diagnosis not present

## 2014-09-18 DIAGNOSIS — N2889 Other specified disorders of kidney and ureter: Secondary | ICD-10-CM | POA: Diagnosis not present

## 2014-09-18 DIAGNOSIS — N289 Disorder of kidney and ureter, unspecified: Secondary | ICD-10-CM | POA: Diagnosis not present

## 2014-09-18 LAB — CBC WITH DIFFERENTIAL/PLATELET
BASOS PCT: 1 % (ref 0–1)
Basophils Absolute: 0.1 10*3/uL (ref 0.0–0.1)
EOS ABS: 0.1 10*3/uL (ref 0.0–0.7)
Eosinophils Relative: 2 % (ref 0–5)
HEMATOCRIT: 36.3 % (ref 36.0–46.0)
Hemoglobin: 11.4 g/dL — ABNORMAL LOW (ref 12.0–15.0)
Lymphocytes Relative: 28 % (ref 12–46)
Lymphs Abs: 2 10*3/uL (ref 0.7–4.0)
MCH: 24.7 pg — ABNORMAL LOW (ref 26.0–34.0)
MCHC: 31.4 g/dL (ref 30.0–36.0)
MCV: 78.7 fL (ref 78.0–100.0)
MONO ABS: 0.4 10*3/uL (ref 0.1–1.0)
MPV: 10.9 fL (ref 8.6–12.4)
Monocytes Relative: 6 % (ref 3–12)
NEUTROS PCT: 63 % (ref 43–77)
Neutro Abs: 4.5 10*3/uL (ref 1.7–7.7)
PLATELETS: 270 10*3/uL (ref 150–400)
RBC: 4.61 MIL/uL (ref 3.87–5.11)
RDW: 15.9 % — AB (ref 11.5–15.5)
WBC: 7.1 10*3/uL (ref 4.0–10.5)

## 2014-09-18 LAB — BASIC METABOLIC PANEL
BUN: 18 mg/dL (ref 6–23)
CO2: 22 mEq/L (ref 19–32)
CREATININE: 0.65 mg/dL (ref 0.50–1.10)
Calcium: 8.9 mg/dL (ref 8.4–10.5)
Chloride: 105 mEq/L (ref 96–112)
Glucose, Bld: 168 mg/dL — ABNORMAL HIGH (ref 70–99)
Potassium: 4.1 mEq/L (ref 3.5–5.3)
Sodium: 140 mEq/L (ref 135–145)

## 2014-09-18 LAB — FERRITIN: Ferritin: 9 ng/mL — ABNORMAL LOW (ref 10–291)

## 2014-09-20 ENCOUNTER — Other Ambulatory Visit: Payer: Self-pay | Admitting: Oncology

## 2014-09-20 ENCOUNTER — Telehealth: Payer: Self-pay | Admitting: *Deleted

## 2014-09-20 DIAGNOSIS — D6489 Other specified anemias: Secondary | ICD-10-CM

## 2014-09-20 NOTE — Telephone Encounter (Signed)
-----   Message from Annia Belt, MD sent at 09/19/2014  9:19 AM EDT ----- Call pt: red count slightly lower; iron level lower: she needs another dose of IV iron - please have her pick a day - I would like to give 2 doses, 1 week apart, at medical day/short stay; Let me know dates - call short stay to schedule then I will put in orders DrG

## 2014-09-20 NOTE — Telephone Encounter (Addendum)
Pt called / informed red count and iron level are lower and she will need another dose of IV iron per Dr Beryle Beams. Pt made aware she needs 2 doses- 1 week apart. Pt stated any day except on Thursday. Alden Server, Short Stay here at [facility], appt scheduled for May 17@ 1100AM. Pt called and informed of appt.

## 2014-09-24 ENCOUNTER — Other Ambulatory Visit (HOSPITAL_COMMUNITY): Payer: Self-pay | Admitting: *Deleted

## 2014-09-25 ENCOUNTER — Encounter (HOSPITAL_COMMUNITY)
Admission: RE | Admit: 2014-09-25 | Discharge: 2014-09-25 | Disposition: A | Discharge status: Home / Self Care | Payer: Medicare Other | Source: Ambulatory Visit | Attending: Oncology | Admitting: Oncology

## 2014-09-25 VITALS — BP 164/64 | HR 71 | Temp 99.1°F | Resp 20 | Ht 64.0 in | Wt 145.0 lb

## 2014-09-25 DIAGNOSIS — D6489 Other specified anemias: Secondary | ICD-10-CM | POA: Insufficient documentation

## 2014-09-25 MED ORDER — SODIUM CHLORIDE 0.9 % IV SOLN
510.0000 mg | INTRAVENOUS | Status: DC
  Administered 2014-09-25: 510 mg via INTRAVENOUS
  Filled 2014-09-25: qty 17

## 2014-10-02 ENCOUNTER — Ambulatory Visit (HOSPITAL_COMMUNITY): Payer: Medicare Other

## 2014-10-03 ENCOUNTER — Other Ambulatory Visit (HOSPITAL_COMMUNITY): Payer: Self-pay | Admitting: *Deleted

## 2014-10-04 ENCOUNTER — Encounter (HOSPITAL_COMMUNITY)
Admission: RE | Admit: 2014-10-04 | Discharge: 2014-10-04 | Disposition: A | Discharge status: Home / Self Care | Payer: Medicare Other | Source: Ambulatory Visit | Attending: Oncology | Admitting: Oncology

## 2014-10-04 VITALS — BP 159/69 | HR 66 | Temp 98.5°F | Resp 20 | Ht 64.0 in | Wt 140.0 lb

## 2014-10-04 DIAGNOSIS — D6489 Other specified anemias: Secondary | ICD-10-CM

## 2014-10-04 MED ORDER — SODIUM CHLORIDE 0.9 % IV SOLN
510.0000 mg | INTRAVENOUS | Status: AC
  Administered 2014-10-04: 510 mg via INTRAVENOUS
  Filled 2014-10-04: qty 17

## 2014-10-10 ENCOUNTER — Ambulatory Visit (HOSPITAL_COMMUNITY)
Admission: RE | Admit: 2014-10-10 | Discharge: 2014-10-10 | Disposition: A | Discharge status: Home / Self Care | Payer: Medicare Other | Source: Ambulatory Visit | Attending: Oncology | Admitting: Oncology

## 2014-10-10 DIAGNOSIS — D649 Anemia, unspecified: Secondary | ICD-10-CM | POA: Diagnosis not present

## 2014-10-10 DIAGNOSIS — N2889 Other specified disorders of kidney and ureter: Secondary | ICD-10-CM | POA: Diagnosis not present

## 2014-10-10 DIAGNOSIS — K5090 Crohn's disease, unspecified, without complications: Secondary | ICD-10-CM | POA: Insufficient documentation

## 2014-10-10 DIAGNOSIS — N289 Disorder of kidney and ureter, unspecified: Secondary | ICD-10-CM | POA: Diagnosis not present

## 2014-10-10 MED ORDER — IOHEXOL 300 MG/ML  SOLN
80.0000 mL | Freq: Once | INTRAMUSCULAR | Status: AC | PRN
  Administered 2014-10-10: 100 mL via INTRAVENOUS

## 2014-10-12 ENCOUNTER — Telehealth: Payer: Self-pay | Admitting: *Deleted

## 2014-10-12 NOTE — Telephone Encounter (Signed)
Pt called / informed result of CT Scan "spot in kidney smaller; radiologist feels likely a benign cyst" per Dr Beryle Beams. Pt stated this is good, now have to get my blood under control.

## 2014-10-12 NOTE — Telephone Encounter (Signed)
-----   Message from Annia Belt, MD sent at 10/10/2014  2:20 PM EDT ----- Call pt: spot in kidney smaller: Radiologist feels likely a benign cyst

## 2014-10-16 ENCOUNTER — Ambulatory Visit: Payer: Medicare Other | Admitting: Oncology

## 2014-10-29 DIAGNOSIS — S30860A Insect bite (nonvenomous) of lower back and pelvis, initial encounter: Secondary | ICD-10-CM | POA: Diagnosis not present

## 2014-10-29 DIAGNOSIS — Z1283 Encounter for screening for malignant neoplasm of skin: Secondary | ICD-10-CM | POA: Diagnosis not present

## 2014-10-30 DIAGNOSIS — H04123 Dry eye syndrome of bilateral lacrimal glands: Secondary | ICD-10-CM | POA: Diagnosis not present

## 2014-10-30 DIAGNOSIS — H10413 Chronic giant papillary conjunctivitis, bilateral: Secondary | ICD-10-CM | POA: Diagnosis not present

## 2014-11-05 ENCOUNTER — Other Ambulatory Visit: Payer: Self-pay

## 2014-11-27 ENCOUNTER — Ambulatory Visit (INDEPENDENT_AMBULATORY_CARE_PROVIDER_SITE_OTHER): Payer: Medicare Other | Admitting: Oncology

## 2014-11-27 ENCOUNTER — Encounter: Payer: Self-pay | Admitting: Oncology

## 2014-11-27 VITALS — BP 175/67 | HR 78 | Temp 98.0°F | Ht 65.0 in | Wt 181.9 lb

## 2014-11-27 DIAGNOSIS — K449 Diaphragmatic hernia without obstruction or gangrene: Secondary | ICD-10-CM

## 2014-11-27 DIAGNOSIS — E039 Hypothyroidism, unspecified: Secondary | ICD-10-CM

## 2014-11-27 DIAGNOSIS — N289 Disorder of kidney and ureter, unspecified: Secondary | ICD-10-CM | POA: Diagnosis not present

## 2014-11-27 DIAGNOSIS — D509 Iron deficiency anemia, unspecified: Secondary | ICD-10-CM

## 2014-11-27 DIAGNOSIS — I341 Nonrheumatic mitral (valve) prolapse: Secondary | ICD-10-CM

## 2014-11-27 DIAGNOSIS — J45909 Unspecified asthma, uncomplicated: Secondary | ICD-10-CM | POA: Diagnosis not present

## 2014-11-27 DIAGNOSIS — N2889 Other specified disorders of kidney and ureter: Secondary | ICD-10-CM

## 2014-11-27 LAB — CBC WITH DIFFERENTIAL/PLATELET
Basophils Absolute: 0.1 10*3/uL (ref 0.0–0.1)
Basophils Relative: 1 % (ref 0–1)
Eosinophils Absolute: 0.2 10*3/uL (ref 0.0–0.7)
Eosinophils Relative: 3 % (ref 0–5)
HEMATOCRIT: 41.1 % (ref 36.0–46.0)
Hemoglobin: 13.4 g/dL (ref 12.0–15.0)
Lymphocytes Relative: 25 % (ref 12–46)
Lymphs Abs: 1.4 10*3/uL (ref 0.7–4.0)
MCH: 28.2 pg (ref 26.0–34.0)
MCHC: 32.6 g/dL (ref 30.0–36.0)
MCV: 86.3 fL (ref 78.0–100.0)
MONO ABS: 0.4 10*3/uL (ref 0.1–1.0)
MONOS PCT: 7 % (ref 3–12)
MPV: 10.5 fL (ref 8.6–12.4)
NEUTROS PCT: 64 % (ref 43–77)
Neutro Abs: 3.5 10*3/uL (ref 1.7–7.7)
Platelets: 211 10*3/uL (ref 150–400)
RBC: 4.76 MIL/uL (ref 3.87–5.11)
RDW: 17.5 % — ABNORMAL HIGH (ref 11.5–15.5)
WBC: 5.5 10*3/uL (ref 4.0–10.5)

## 2014-11-27 LAB — RETICULOCYTES
ABS Retic: 66.6 10*3/uL (ref 19.0–186.0)
RBC.: 4.76 MIL/uL (ref 3.87–5.11)
Retic Ct Pct: 1.4 % (ref 0.4–2.3)

## 2014-11-27 LAB — IRON AND TIBC
%SAT: 20 % (ref 20–55)
Iron: 63 ug/dL (ref 42–145)
TIBC: 313 ug/dL (ref 250–470)
UIBC: 250 ug/dL (ref 125–400)

## 2014-11-27 LAB — FERRITIN: FERRITIN: 61 ng/mL (ref 10–291)

## 2014-11-27 NOTE — Patient Instructions (Signed)
To lab today: we will call you with results Continue every 2 month lab MD visit with Dr Darnell Level in 1 year Schedule follow up visit with Dr Oletta Lamas, GI, to discuss possible capsule endoscopy

## 2014-11-27 NOTE — Progress Notes (Signed)
Patient ID: Caitlyn [name], female   DOB: 06-12-1945, 69 y.o.   MRN: [id number] Hematology and Oncology Follow Up Visit  Caitlyn [name] [id number] [birth year] 69 y.o. 11/27/2014 6:48 PM   Principle Diagnosis: Encounter Diagnoses  Name Primary?  . Iron deficiency anemia Yes  . Mass of right kidney   Clinical Summary: Pleasant 69 year old woman. I initially saw her in October 2014 to evaluate iron deficiency anemia. She had a negative upper and lower endoscopy except for a hiatus hernia. She reported extreme intolerance to oral iron preparations. Hemoglobin got as low as 7 g. When I saw her on March 08, 2013,  ferritin was 7. Hemoglobin 10.6. MCV 79. I believe this was a post transfusion value. She received a dose of parenteral iron in our office 1020 milligrams on 03/24/2013. Hemoglobin came up to a peak value of 12.1 g by 05/01/2013 ; Ferritin came up to a peak value of 123 by December 22 but fell rapidly back down to 10 by 08/14/2013 and another infusion of iron was given on April 9. She required IV iron again in May 2016 when ferritin fell to 9.  Additional problem identified during her evaluation was a small, 1.3 cm lesion in her right kidney which enhanced. This was initially noted on a study done on 09/21/2013 and did not change on a study done on 08/29/2013. I obtained an MRI scan  on March 13, 2014. There were no other areas of abnormality and no interval growth of this lesion. Most recent CT scan done 10/10/2014 shows that the lesion is now smaller and is felt to be an involuting cyst. Other incidental findings include a large hiatus hernia and colonic diverticulosis. No additional imaging will be necessary at this point.   Interim History:   Overall doing well but she is concerned about persistent and progressive fatigue. She just received an iron infusion weekly 2 in May. However in view of her symptoms I will check a CBC today. She denies any hematochezia or melena. She has noted  blood on the toilet tissue on 2 occasions over the past few months. No hematuria. No vaginal bleeding. She is still having trouble with her skin. She had what was an apparent localized flareup of bullous pemphigoid on her back however treatment with topical steroids and oral doxycycline did not improve the dermatitis.  Medications: reviewed  Allergies:  Allergies  Allergen Reactions  . Cortisone Anaphylaxis    Some forms of cortisone  . Monosodium Glutamate Anaphylaxis  . Prednisone Anaphylaxis    confusion  . Shellfish Allergy Anaphylaxis  . Demerol [Meperidine]   . Morphine And Related Other (See Comments)    Vomiting, shaking, & elevates BP  . Sulfa Antibiotics     Review of Systems: See history of present illness. Remaining ROS negative:   Physical Exam: Blood pressure 175/67, pulse 78, temperature 98 F (36.7 C), temperature source Oral, height 5\' 5"  (1.651 m), weight 181 lb 14.4 oz (82.509 kg), SpO2 99 %. Wt Readings from Last 3 Encounters:  11/27/14 181 lb 14.4 oz (82.509 kg)  10/04/14 140 lb (63.504 kg)  09/25/14 145 lb (65.772 kg)     General appearance: Well nourished Caucasian woman HENNT: Pharynx no erythema, exudate, mass, or ulcer. No thyromegaly or thyroid nodules Lymph nodes: No cervical, supraclavicular, or axillary lymphadenopathy Breasts:  Lungs: Clear to auscultation, resonant to percussion throughout Heart: Regular rhythm, no murmur, no gallop, no rub, no click, no edema Abdomen: Soft, nontender, normal bowel sounds,  no mass, no organomegaly Extremities: No edema, no calf tenderness Musculoskeletal:  joint deformities of her knees GU:  Vascular: Carotid pulses 2+, no bruits, distal pulses: Dorsalis pedis 1+ symmetric Neurologic: Alert, oriented, PERRLA, cranial nerves grossly normal, motor strength 5 over 5, reflexes 1+ symmetric, upper body coordination normal, gait normal, Skin: No rash or ecchymosis  Lab Results: CBC W/Diff    Component Value  Date/Time   WBC 7.1 09/18/2014 1042   WBC 8.0 08/14/2013 1318   RBC 4.61 09/18/2014 1042   RBC 4.42 08/14/2013 1318   HGB 11.4* 09/18/2014 1042   HGB 11.9 08/14/2013 1318   HCT 36.3 09/18/2014 1042   HCT 36.9 08/14/2013 1318   PLT 270 09/18/2014 1042   PLT 233 08/14/2013 1318   MCV 78.7 09/18/2014 1042   MCV 83.7 08/14/2013 1318   MCH 24.7* 09/18/2014 1042   MCH 27.0 08/14/2013 1318   MCHC 31.4 09/18/2014 1042   MCHC 32.3 08/14/2013 1318   RDW 15.9* 09/18/2014 1042   RDW 14.5 08/14/2013 1318   LYMPHSABS 2.0 09/18/2014 1042   LYMPHSABS 2.0 08/14/2013 1318   MONOABS 0.4 09/18/2014 1042   MONOABS 0.6 08/14/2013 1318   EOSABS 0.1 09/18/2014 1042   EOSABS 0.3 08/14/2013 1318   BASOSABS 0.1 09/18/2014 1042   BASOSABS 0.1 08/14/2013 1318     Chemistry      Component Value Date/Time   NA 140 09/18/2014 1042   K 4.1 09/18/2014 1042   CL 105 09/18/2014 1042   CO2 22 09/18/2014 1042   BUN 18 09/18/2014 1042   CREATININE 0.65 09/18/2014 1042   CREATININE 0.60 03/13/2014 1310      Component Value Date/Time   CALCIUM 8.9 09/18/2014 1042   ALKPHOS 94 08/29/2013 0849   AST 19 08/29/2013 0849   ALT 14 08/29/2013 0849   BILITOT 0.3 08/29/2013 0849       Radiological Studies: No results found.  Impression:  1. Intolerance/malabsorption?  of oral iron. Improvement in hemoglobin with trial of parenteral iron, however ferritin levels fall rapidly within 5 months of each infusion suggesting that she may have some chronic GI blood loss with most likely etiology at her age with negative upper and lower endoscopy being AVMs of the small bowel. I would like her to get a follow-up visit with Dr. Oletta Lamas and be evaluated for capsule endoscopy.  #2. Question Solid lesion right kidney incidentally noted on a CT scan of the abdomen and pelvis in May 2014. Likely benign complex cyst. Stable to decreased on serial imaging.  See discussion above.  #3. Hiatus hernia with reflux May be an  additional source of chronic low-grade GI blood loss.  #4. Hypothyroid on replacement  #5. Mitral valve prolapse  #6. Asthma  . Status post repair right Dupuytren's contracture July 2015   CC: Patient Care Team: Josetta Huddle, MD as PCP - General (Internal Medicine) Annia Belt, MD as Consulting Physician (Oncology)   Annia Belt, MD 7/19/20166:48 PM

## 2014-11-30 ENCOUNTER — Telehealth: Payer: Self-pay | Admitting: *Deleted

## 2014-11-30 NOTE — Telephone Encounter (Signed)
Pt called / informed Hgb very good @ 13.4 and iron levels are good also so her fatigue is not from recurrent anemia per Dr Beryle Beams. Stated she feels "a little better" and it may from the heat; walks her dog 2 times a day. Instructed pt to stay hydrated.

## 2014-11-30 NOTE — Telephone Encounter (Signed)
-----   Message from Caitlyn Belt, MD sent at 11/28/2014  9:23 AM EDT ----- Call pt:  Blood count very good at 13.4  And iron levels good so her fatigue not from recurrent anemia

## 2014-12-10 ENCOUNTER — Other Ambulatory Visit: Payer: Self-pay

## 2014-12-10 DIAGNOSIS — Z1231 Encounter for screening mammogram for malignant neoplasm of breast: Secondary | ICD-10-CM

## 2014-12-11 DIAGNOSIS — K921 Melena: Secondary | ICD-10-CM | POA: Diagnosis not present

## 2014-12-11 DIAGNOSIS — R1013 Epigastric pain: Secondary | ICD-10-CM | POA: Diagnosis not present

## 2014-12-14 DIAGNOSIS — K449 Diaphragmatic hernia without obstruction or gangrene: Secondary | ICD-10-CM | POA: Diagnosis not present

## 2014-12-14 DIAGNOSIS — K2980 Duodenitis without bleeding: Secondary | ICD-10-CM | POA: Diagnosis not present

## 2014-12-14 DIAGNOSIS — R1013 Epigastric pain: Secondary | ICD-10-CM | POA: Diagnosis not present

## 2014-12-14 DIAGNOSIS — R195 Other fecal abnormalities: Secondary | ICD-10-CM | POA: Diagnosis not present

## 2014-12-14 DIAGNOSIS — D509 Iron deficiency anemia, unspecified: Secondary | ICD-10-CM | POA: Diagnosis not present

## 2014-12-18 ENCOUNTER — Other Ambulatory Visit: Payer: Self-pay | Admitting: Gastroenterology

## 2014-12-18 DIAGNOSIS — R1013 Epigastric pain: Secondary | ICD-10-CM

## 2014-12-21 ENCOUNTER — Ambulatory Visit
Admission: RE | Admit: 2014-12-21 | Discharge: 2014-12-21 | Disposition: A | Discharge status: Home / Self Care | Payer: Medicare Other | Source: Ambulatory Visit | Attending: Gastroenterology | Admitting: Gastroenterology

## 2014-12-21 DIAGNOSIS — R1032 Left lower quadrant pain: Secondary | ICD-10-CM | POA: Diagnosis not present

## 2014-12-21 DIAGNOSIS — M5489 Other dorsalgia: Secondary | ICD-10-CM | POA: Diagnosis not present

## 2014-12-21 DIAGNOSIS — R1013 Epigastric pain: Secondary | ICD-10-CM

## 2014-12-21 DIAGNOSIS — K760 Fatty (change of) liver, not elsewhere classified: Secondary | ICD-10-CM | POA: Diagnosis not present

## 2015-01-16 ENCOUNTER — Ambulatory Visit
Admission: RE | Admit: 2015-01-16 | Discharge: 2015-01-16 | Disposition: A | Discharge status: Home / Self Care | Payer: Medicare Other | Source: Ambulatory Visit

## 2015-01-16 DIAGNOSIS — Z1231 Encounter for screening mammogram for malignant neoplasm of breast: Secondary | ICD-10-CM

## 2015-01-28 ENCOUNTER — Other Ambulatory Visit: Payer: Self-pay | Admitting: Oncology

## 2015-01-28 DIAGNOSIS — N63 Unspecified lump in unspecified breast: Secondary | ICD-10-CM

## 2015-01-29 ENCOUNTER — Other Ambulatory Visit: Payer: Medicare Other

## 2015-02-01 ENCOUNTER — Other Ambulatory Visit: Payer: Self-pay | Admitting: Oncology

## 2015-02-01 ENCOUNTER — Ambulatory Visit
Admission: RE | Admit: 2015-02-01 | Discharge: 2015-02-01 | Disposition: A | Discharge status: Home / Self Care | Payer: Medicare Other | Source: Ambulatory Visit | Attending: Oncology | Admitting: Oncology

## 2015-02-01 ENCOUNTER — Other Ambulatory Visit: Payer: Medicare Other

## 2015-02-01 DIAGNOSIS — N63 Unspecified lump in unspecified breast: Secondary | ICD-10-CM

## 2015-02-01 DIAGNOSIS — D509 Iron deficiency anemia, unspecified: Secondary | ICD-10-CM

## 2015-02-02 LAB — CBC WITH DIFFERENTIAL/PLATELET
BASOS ABS: 0 10*3/uL (ref 0.0–0.2)
Basos: 1 %
EOS (ABSOLUTE): 0.1 10*3/uL (ref 0.0–0.4)
Eos: 2 %
Hematocrit: 38.4 % (ref 34.0–46.6)
Hemoglobin: 12.3 g/dL (ref 11.1–15.9)
IMMATURE GRANS (ABS): 0 10*3/uL (ref 0.0–0.1)
Immature Granulocytes: 0 %
LYMPHS: 26 %
Lymphocytes Absolute: 1.9 10*3/uL (ref 0.7–3.1)
MCH: 27.6 pg (ref 26.6–33.0)
MCHC: 32 g/dL (ref 31.5–35.7)
MCV: 86 fL (ref 79–97)
MONOS ABS: 0.4 10*3/uL (ref 0.1–0.9)
Monocytes: 5 %
NEUTROS PCT: 66 %
Neutrophils Absolute: 4.8 10*3/uL (ref 1.4–7.0)
PLATELETS: 287 10*3/uL (ref 150–379)
RBC: 4.46 x10E6/uL (ref 3.77–5.28)
RDW: 14.1 % (ref 12.3–15.4)
WBC: 7.2 10*3/uL (ref 3.4–10.8)

## 2015-02-02 LAB — FERRITIN: FERRITIN: 15 ng/mL (ref 15–150)

## 2015-02-18 ENCOUNTER — Telehealth: Payer: Self-pay | Admitting: *Deleted

## 2015-02-18 NOTE — Telephone Encounter (Signed)
Returned pt's call - states she did not hear from [place] about her lab results. Informed her, Ferritin is 15 and Hgb 12.3.

## 2015-02-21 DIAGNOSIS — H5203 Hypermetropia, bilateral: Secondary | ICD-10-CM | POA: Diagnosis not present

## 2015-02-21 DIAGNOSIS — H2513 Age-related nuclear cataract, bilateral: Secondary | ICD-10-CM | POA: Diagnosis not present

## 2015-03-11 DIAGNOSIS — I10 Essential (primary) hypertension: Secondary | ICD-10-CM | POA: Diagnosis not present

## 2015-03-11 DIAGNOSIS — Z23 Encounter for immunization: Secondary | ICD-10-CM | POA: Diagnosis not present

## 2015-03-11 DIAGNOSIS — M720 Palmar fascial fibromatosis [Dupuytren]: Secondary | ICD-10-CM | POA: Diagnosis not present

## 2015-03-11 DIAGNOSIS — F329 Major depressive disorder, single episode, unspecified: Secondary | ICD-10-CM | POA: Diagnosis not present

## 2015-03-11 DIAGNOSIS — J4520 Mild intermittent asthma, uncomplicated: Secondary | ICD-10-CM | POA: Diagnosis not present

## 2015-03-11 DIAGNOSIS — L281 Prurigo nodularis: Secondary | ICD-10-CM | POA: Diagnosis not present

## 2015-03-11 DIAGNOSIS — Z0001 Encounter for general adult medical examination with abnormal findings: Secondary | ICD-10-CM | POA: Diagnosis not present

## 2015-03-11 DIAGNOSIS — E039 Hypothyroidism, unspecified: Secondary | ICD-10-CM | POA: Diagnosis not present

## 2015-03-11 DIAGNOSIS — E559 Vitamin D deficiency, unspecified: Secondary | ICD-10-CM | POA: Diagnosis not present

## 2015-03-11 DIAGNOSIS — E785 Hyperlipidemia, unspecified: Secondary | ICD-10-CM | POA: Diagnosis not present

## 2015-03-11 DIAGNOSIS — Z79899 Other long term (current) drug therapy: Secondary | ICD-10-CM | POA: Diagnosis not present

## 2015-03-11 DIAGNOSIS — R739 Hyperglycemia, unspecified: Secondary | ICD-10-CM | POA: Diagnosis not present

## 2015-04-02 ENCOUNTER — Other Ambulatory Visit: Payer: Medicare Other

## 2015-04-08 DIAGNOSIS — F329 Major depressive disorder, single episode, unspecified: Secondary | ICD-10-CM | POA: Diagnosis not present

## 2015-04-08 DIAGNOSIS — R739 Hyperglycemia, unspecified: Secondary | ICD-10-CM | POA: Diagnosis not present

## 2015-04-25 DIAGNOSIS — H25011 Cortical age-related cataract, right eye: Secondary | ICD-10-CM | POA: Diagnosis not present

## 2015-04-25 DIAGNOSIS — H25013 Cortical age-related cataract, bilateral: Secondary | ICD-10-CM | POA: Diagnosis not present

## 2015-04-25 DIAGNOSIS — H2511 Age-related nuclear cataract, right eye: Secondary | ICD-10-CM | POA: Diagnosis not present

## 2015-04-25 DIAGNOSIS — H2513 Age-related nuclear cataract, bilateral: Secondary | ICD-10-CM | POA: Diagnosis not present

## 2015-05-22 DIAGNOSIS — H2511 Age-related nuclear cataract, right eye: Secondary | ICD-10-CM | POA: Diagnosis not present

## 2015-05-22 DIAGNOSIS — H25012 Cortical age-related cataract, left eye: Secondary | ICD-10-CM | POA: Diagnosis not present

## 2015-05-22 DIAGNOSIS — H25011 Cortical age-related cataract, right eye: Secondary | ICD-10-CM | POA: Diagnosis not present

## 2015-05-22 DIAGNOSIS — H2512 Age-related nuclear cataract, left eye: Secondary | ICD-10-CM | POA: Diagnosis not present

## 2015-05-28 ENCOUNTER — Other Ambulatory Visit: Payer: Medicare Other

## 2015-05-30 ENCOUNTER — Other Ambulatory Visit (INDEPENDENT_AMBULATORY_CARE_PROVIDER_SITE_OTHER): Payer: Medicare Other

## 2015-05-30 DIAGNOSIS — D509 Iron deficiency anemia, unspecified: Secondary | ICD-10-CM

## 2015-05-31 ENCOUNTER — Telehealth: Payer: Self-pay | Admitting: *Deleted

## 2015-05-31 LAB — CBC WITH DIFFERENTIAL/PLATELET
BASOS ABS: 0 10*3/uL (ref 0.0–0.2)
BASOS: 0 %
EOS (ABSOLUTE): 0.1 10*3/uL (ref 0.0–0.4)
Eos: 1 %
HEMATOCRIT: 34.5 % (ref 34.0–46.6)
HEMOGLOBIN: 10.7 g/dL — AB (ref 11.1–15.9)
IMMATURE GRANS (ABS): 0 10*3/uL (ref 0.0–0.1)
Immature Granulocytes: 0 %
LYMPHS: 26 %
Lymphocytes Absolute: 2.1 10*3/uL (ref 0.7–3.1)
MCH: 24.2 pg — AB (ref 26.6–33.0)
MCHC: 31 g/dL — ABNORMAL LOW (ref 31.5–35.7)
MCV: 78 fL — AB (ref 79–97)
MONOS ABS: 0.5 10*3/uL (ref 0.1–0.9)
Monocytes: 6 %
NEUTROS ABS: 5.4 10*3/uL (ref 1.4–7.0)
NEUTROS PCT: 67 %
Platelets: 300 10*3/uL (ref 150–379)
RBC: 4.43 x10E6/uL (ref 3.77–5.28)
RDW: 15.3 % (ref 12.3–15.4)
WBC: 8.1 10*3/uL (ref 3.4–10.8)

## 2015-05-31 LAB — FERRITIN: Ferritin: 8 ng/mL — ABNORMAL LOW (ref 15–150)

## 2015-05-31 NOTE — Telephone Encounter (Addendum)
Pt called / informed Hgb starting to fall @ 10.7 and iron levels are low @ 8 and will need IV iron q week x 2 per Dr Beryle Beams. Pt stated she could feel her iron was low b/c she felt tired and change in her bowels. Appt scheduled per pt on 1/27 @ 1000 AM.

## 2015-05-31 NOTE — Telephone Encounter (Signed)
-----   Message from Annia Belt, MD sent at 05/31/2015  7:50 AM EST ----- Call pt: Hb starting to fall; iron levels low - we need to schedule IV iron Q week x 2 - please schedule with short stay & patient - let me know dates so I can put in orders

## 2015-06-04 ENCOUNTER — Other Ambulatory Visit: Payer: Self-pay | Admitting: Oncology

## 2015-06-04 DIAGNOSIS — K909 Intestinal malabsorption, unspecified: Secondary | ICD-10-CM

## 2015-06-05 DIAGNOSIS — H25012 Cortical age-related cataract, left eye: Secondary | ICD-10-CM | POA: Diagnosis not present

## 2015-06-05 DIAGNOSIS — H2512 Age-related nuclear cataract, left eye: Secondary | ICD-10-CM | POA: Diagnosis not present

## 2015-06-07 ENCOUNTER — Ambulatory Visit (HOSPITAL_COMMUNITY)
Admission: RE | Admit: 2015-06-07 | Discharge: 2015-06-07 | Disposition: A | Discharge status: Home / Self Care | Payer: Medicare Other | Source: Ambulatory Visit | Attending: Oncology | Admitting: Oncology

## 2015-06-07 VITALS — BP 160/66 | HR 69 | Temp 98.1°F | Resp 20 | Ht 65.0 in

## 2015-06-07 DIAGNOSIS — K909 Intestinal malabsorption, unspecified: Secondary | ICD-10-CM | POA: Insufficient documentation

## 2015-06-07 MED ORDER — SODIUM CHLORIDE 0.9 % IV SOLN
510.0000 mg | INTRAVENOUS | Status: DC
  Administered 2015-06-07: 510 mg via INTRAVENOUS
  Filled 2015-06-07: qty 17

## 2015-06-13 ENCOUNTER — Other Ambulatory Visit (HOSPITAL_COMMUNITY): Payer: Self-pay | Admitting: *Deleted

## 2015-06-14 ENCOUNTER — Encounter (HOSPITAL_COMMUNITY)
Admission: RE | Admit: 2015-06-14 | Discharge: 2015-06-14 | Disposition: A | Discharge status: Home / Self Care | Payer: Medicare Other | Source: Ambulatory Visit | Attending: Oncology | Admitting: Oncology

## 2015-06-14 VITALS — BP 154/69 | HR 70 | Temp 98.3°F | Resp 20 | Ht 65.0 in | Wt 150.0 lb

## 2015-06-14 DIAGNOSIS — K909 Intestinal malabsorption, unspecified: Secondary | ICD-10-CM | POA: Diagnosis not present

## 2015-06-14 MED ORDER — SODIUM CHLORIDE 0.9 % IV SOLN
510.0000 mg | INTRAVENOUS | Status: AC
  Administered 2015-06-14: 510 mg via INTRAVENOUS
  Filled 2015-06-14: qty 17

## 2015-07-25 ENCOUNTER — Other Ambulatory Visit: Payer: Medicare Other

## 2015-07-25 DIAGNOSIS — H43811 Vitreous degeneration, right eye: Secondary | ICD-10-CM | POA: Diagnosis not present

## 2015-07-25 DIAGNOSIS — H35371 Puckering of macula, right eye: Secondary | ICD-10-CM | POA: Diagnosis not present

## 2015-07-25 DIAGNOSIS — Z961 Presence of intraocular lens: Secondary | ICD-10-CM | POA: Diagnosis not present

## 2015-07-30 ENCOUNTER — Other Ambulatory Visit: Payer: Medicare Other

## 2015-08-08 ENCOUNTER — Other Ambulatory Visit (INDEPENDENT_AMBULATORY_CARE_PROVIDER_SITE_OTHER): Payer: Medicare Other

## 2015-08-08 DIAGNOSIS — D509 Iron deficiency anemia, unspecified: Secondary | ICD-10-CM

## 2015-08-09 LAB — CBC WITH DIFFERENTIAL/PLATELET
BASOS: 1 %
Basophils Absolute: 0.1 10*3/uL (ref 0.0–0.2)
EOS (ABSOLUTE): 0.1 10*3/uL (ref 0.0–0.4)
EOS: 1 %
HEMATOCRIT: 45 % (ref 34.0–46.6)
Hemoglobin: 14.8 g/dL (ref 11.1–15.9)
Immature Grans (Abs): 0 10*3/uL (ref 0.0–0.1)
Immature Granulocytes: 0 %
Lymphocytes Absolute: 2.1 10*3/uL (ref 0.7–3.1)
Lymphs: 32 %
MCH: 27.9 pg (ref 26.6–33.0)
MCHC: 32.9 g/dL (ref 31.5–35.7)
MCV: 85 fL (ref 79–97)
MONOS ABS: 0.4 10*3/uL (ref 0.1–0.9)
Monocytes: 6 %
Neutrophils Absolute: 3.9 10*3/uL (ref 1.4–7.0)
Neutrophils: 60 %
Platelets: 261 10*3/uL (ref 150–379)
RBC: 5.3 x10E6/uL — AB (ref 3.77–5.28)
RDW: 20 % — AB (ref 12.3–15.4)
WBC: 6.5 10*3/uL (ref 3.4–10.8)

## 2015-08-09 LAB — FERRITIN: Ferritin: 173 ng/mL — ABNORMAL HIGH (ref 15–150)

## 2015-08-14 ENCOUNTER — Telehealth: Payer: Self-pay | Admitting: *Deleted

## 2015-08-14 NOTE — Telephone Encounter (Signed)
Pt called - no answer; left message "red count now high normal (RBC 5.3) and plenty of iron reserves (Ferritin 173) " per Dr Beryle Beams. And to call if she has any questions.

## 2015-08-14 NOTE — Telephone Encounter (Signed)
-----   Message from Annia Belt, MD sent at 08/09/2015  1:42 PM EDT ----- Call pt: red count now high normal & plenty of iron reserves

## 2015-08-26 ENCOUNTER — Ambulatory Visit (INDEPENDENT_AMBULATORY_CARE_PROVIDER_SITE_OTHER): Payer: Medicare Other | Admitting: Oncology

## 2015-08-26 ENCOUNTER — Encounter: Payer: Self-pay | Admitting: Oncology

## 2015-08-26 VITALS — BP 175/90 | HR 86 | Temp 98.3°F | Ht 65.0 in | Wt 174.9 lb

## 2015-08-26 DIAGNOSIS — D509 Iron deficiency anemia, unspecified: Secondary | ICD-10-CM

## 2015-08-26 DIAGNOSIS — N2889 Other specified disorders of kidney and ureter: Secondary | ICD-10-CM | POA: Diagnosis not present

## 2015-08-26 NOTE — Progress Notes (Signed)
Patient ID: Caitlyn [name], female   DOB: [birth year], 70 y.o.   MRN: [id number] Hematology and Oncology Follow Up Visit  Jayd Oberg [id number] 1945-05-27 70 y.o. 08/26/2015 7:01 PM   Principle Diagnosis: Encounter Diagnoses  Name Primary?  . Iron deficiency anemia Yes  . Mass of right kidney   Clinical Summary: Pleasant 70 year old woman I initially saw in October 2014 to evaluate iron deficiency anemia. She had a negative upper and lower endoscopy except for a hiatus hernia. She reported extreme intolerance to oral iron preparations. Hemoglobin got as low as 7 g. When I saw her on March 08, 2013, ferritin was 7. Hemoglobin 10.6. MCV 79. I believe this was a post transfusion value. She received a dose of parenteral iron in our office 1020 milligrams on 03/24/2013. Hemoglobin came up to a peak value of 12.1 g by 05/01/2013 ; Ferritin came up to a peak value of 123 by December 22 but fell rapidly back down to 10 by 08/14/2013 and another infusion of iron was given on April 9. She required IV iron again in May 2016 when ferritin fell to 9. Most recent iron infusions given weekly 2 January 27 and 06/14/2015. Hemoglobin up from 10.7 in January to 14.8 on March 30 with MCV 85. Ferritin 173 up from 8.  Additional problem identified during her initial evaluation was a small, 1.3 cm lesion in her right kidney which enhanced. This was initially noted on a study done on 09/21/2013 and did not change on a study done on 08/29/2013. I obtained an MRI scan on March 13, 2014. There were no other areas of abnormality and no interval growth of this lesion. Most recent CT scan done 10/10/2014 shows that the lesion is now smaller and is felt to be an involuting cyst. Other incidental findings include a large hiatus hernia and colonic diverticulosis.     Interim History:   Her main complaint today is intermittent fecal incontinence and loss of sensation in her rectal area. She has developed a recurrent  pilonidal cyst. She denies any paresthesias. No leg weakness. No prior history of back surgery. No current back pain. She dates changes in her bowel habit back to the time of her last colonoscopy about 3 years ago. She is on he had one pregnancy in the past so chance of ligamentous laxity to explain her symptoms is low.  Medications: reviewed  Allergies:  Allergies  Allergen Reactions  . Cortisone Anaphylaxis    Some forms of cortisone  . Monosodium Glutamate Anaphylaxis  . Prednisone Anaphylaxis    confusion  . Shellfish Allergy Anaphylaxis  . Demerol [Meperidine]   . Morphine And Related Other (See Comments)    Vomiting, shaking, & elevates BP  . Sulfa Antibiotics     Review of Systems: See interim history Remaining ROS negative:   Physical Exam: Blood pressure 175/90, pulse 86, temperature 98.3 F (36.8 C), temperature source Oral, height 5\' 5"  (1.651 m), weight 174 lb 14.4 oz (79.334 kg), SpO2 98 %. Wt Readings from Last 3 Encounters:  08/26/15 174 lb 14.4 oz (79.334 kg)  06/14/15 150 lb (68.04 kg)  11/27/14 181 lb 14.4 oz (82.509 kg)     General appearance: Well nourished Caucasian woman HENNT: Pharynx no erythema, exudate, mass, or ulcer. No thyromegaly or thyroid nodules Lymph nodes: No cervical, supraclavicular, or axillary lymphadenopathy Breasts:  Lungs: Clear to auscultation, resonant to percussion throughout Heart: Regular rhythm, no murmur, no gallop, no rub, no click, no edema Abdomen:  Soft, nontender, normal bowel sounds, no mass, no organomegaly Extremities: No edema, no calf tenderness Musculoskeletal: no joint deformities GU:  Vascular: Carotid pulses 2+, no bruits,  Neurologic: Alert, oriented, PERRLA, I was unable to get a good look at her fundi, cranial nerves grossly normal, motor strength 5 over 5, reflexes 1+ symmetric, upper body coordination normal, gait normal, Skin: No rash or ecchymosis  Lab Results: CBC W/Diff    Component Value  Date/Time   WBC 6.5 08/08/2015 1232   WBC 5.5 11/27/2014 1055   WBC 8.0 08/14/2013 1318   RBC 5.30* 08/08/2015 1232   RBC 4.76 11/27/2014 1055   RBC 4.76 11/27/2014 1055   RBC 4.42 08/14/2013 1318   HGB 13.4 11/27/2014 1055   HGB 11.9 08/14/2013 1318   HCT 45.0 08/08/2015 1232   HCT 41.1 11/27/2014 1055   HCT 36.9 08/14/2013 1318   PLT 261 08/08/2015 1232   PLT 211 11/27/2014 1055   PLT 233 08/14/2013 1318   MCV 85 08/08/2015 1232   MCV 86.3 11/27/2014 1055   MCV 83.7 08/14/2013 1318   MCH 27.9 08/08/2015 1232   MCH 28.2 11/27/2014 1055   MCH 27.0 08/14/2013 1318   MCHC 32.9 08/08/2015 1232   MCHC 32.6 11/27/2014 1055   MCHC 32.3 08/14/2013 1318   RDW 20.0* 08/08/2015 1232   RDW 17.5* 11/27/2014 1055   RDW 14.5 08/14/2013 1318   LYMPHSABS 2.1 08/08/2015 1232   LYMPHSABS 1.4 11/27/2014 1055   LYMPHSABS 2.0 08/14/2013 1318   MONOABS 0.4 11/27/2014 1055   MONOABS 0.6 08/14/2013 1318   EOSABS 0.1 08/08/2015 1232   EOSABS 0.2 11/27/2014 1055   EOSABS 0.3 08/14/2013 1318   BASOSABS 0.1 08/08/2015 1232   BASOSABS 0.1 11/27/2014 1055   BASOSABS 0.1 08/14/2013 1318     Chemistry      Component Value Date/Time   NA 140 09/18/2014 1042   K 4.1 09/18/2014 1042   CL 105 09/18/2014 1042   CO2 22 09/18/2014 1042   BUN 18 09/18/2014 1042   CREATININE 0.65 09/18/2014 1042   CREATININE 0.60 03/13/2014 1310      Component Value Date/Time   CALCIUM 8.9 09/18/2014 1042   ALKPHOS 94 08/29/2013 0849   AST 19 08/29/2013 0849   ALT 14 08/29/2013 0849   BILITOT 0.3 08/29/2013 0849       Radiological Studies: No results found.  Impression:  1. Intolerance/malabsorption? of oral iron. Normalization of hemoglobin with intermittent parenteral iron, however ferritin levels fall rapidly within 5 months of each infusion suggesting that she may have some chronic GI blood loss with most likely etiology at her age with negative upper and lower endoscopy being AVMs of the small bowel. I  wanted her to get a follow-up visit with Dr. Oletta Lamas and be evaluated for capsule endoscopy but it appears that this has not happened in the interval since her last visit with me in July 2016. I'll continue to monitor her blood counts every 4 months. Iron infusions as indicated.   #2. Question Solid lesion right kidney incidentally noted on a CT scan of the abdomen and pelvis in May 2014. Likely benign complex cyst. Stable to decreased on serial imaging. I will obtain 1 additional study at this time. If stable then no further imaging.  #3. Hiatus hernia with reflux May be an additional source of chronic low-grade GI blood loss although no esophagitis or gastritis seen on most recent upper endoscopy in August 2016.  #4. Hypothyroid on replacement  #  5. Mitral valve prolapse  #6. Asthma  #7. Pilonidal cyst. She is advised to get an appointment with a general surgeon.  #8. Decreased rectal sphincter tone by history. No warning signs to suggest significant neurologic compromise. This may be a local problem related to the pilonidal cyst. Further evaluation by general surgery as noted above.   CC: Patient Care Team: Josetta Huddle, MD as PCP - General (Internal Medicine) Annia Belt, MD as Consulting Physician (Oncology)   Annia Belt, MD 4/17/20177:01 PM

## 2015-08-26 NOTE — Patient Instructions (Signed)
To lab today Schedule CT abdomen with contrast at [facility] 1st available Schedule Q 4 month CBC, ferritin to start 11/25/15 MD visit 1 year

## 2015-08-27 LAB — BASIC METABOLIC PANEL
BUN / CREAT RATIO: 21 (ref 12–28)
BUN: 15 mg/dL (ref 8–27)
CO2: 21 mmol/L (ref 18–29)
CREATININE: 0.7 mg/dL (ref 0.57–1.00)
Calcium: 9.4 mg/dL (ref 8.7–10.3)
Chloride: 102 mmol/L (ref 96–106)
GFR calc Af Amer: 101 mL/min/{1.73_m2} (ref >59)
GFR calc non Af Amer: 87 mL/min/{1.73_m2} (ref >59)
Glucose: 126 mg/dL — ABNORMAL HIGH (ref 65–99)
POTASSIUM: 4.1 mmol/L (ref 3.5–5.2)
Sodium: 139 mmol/L (ref 134–144)

## 2015-09-04 ENCOUNTER — Ambulatory Visit (HOSPITAL_COMMUNITY): Payer: Medicare Other

## 2015-10-01 DIAGNOSIS — M5136 Other intervertebral disc degeneration, lumbar region: Secondary | ICD-10-CM | POA: Diagnosis not present

## 2015-10-01 DIAGNOSIS — M549 Dorsalgia, unspecified: Secondary | ICD-10-CM | POA: Diagnosis not present

## 2015-10-01 DIAGNOSIS — R29898 Other symptoms and signs involving the musculoskeletal system: Secondary | ICD-10-CM | POA: Diagnosis not present

## 2015-10-01 DIAGNOSIS — M4316 Spondylolisthesis, lumbar region: Secondary | ICD-10-CM | POA: Diagnosis not present

## 2015-10-01 DIAGNOSIS — M5416 Radiculopathy, lumbar region: Secondary | ICD-10-CM | POA: Diagnosis not present

## 2015-10-01 DIAGNOSIS — M546 Pain in thoracic spine: Secondary | ICD-10-CM | POA: Diagnosis not present

## 2015-10-01 DIAGNOSIS — M47816 Spondylosis without myelopathy or radiculopathy, lumbar region: Secondary | ICD-10-CM | POA: Diagnosis not present

## 2015-10-03 DIAGNOSIS — M5126 Other intervertebral disc displacement, lumbar region: Secondary | ICD-10-CM | POA: Diagnosis not present

## 2015-10-03 DIAGNOSIS — M5416 Radiculopathy, lumbar region: Secondary | ICD-10-CM | POA: Diagnosis not present

## 2015-10-10 DIAGNOSIS — M47816 Spondylosis without myelopathy or radiculopathy, lumbar region: Secondary | ICD-10-CM | POA: Diagnosis not present

## 2015-10-10 DIAGNOSIS — M5136 Other intervertebral disc degeneration, lumbar region: Secondary | ICD-10-CM | POA: Diagnosis not present

## 2015-10-10 DIAGNOSIS — M5126 Other intervertebral disc displacement, lumbar region: Secondary | ICD-10-CM | POA: Diagnosis not present

## 2015-10-10 DIAGNOSIS — M4316 Spondylolisthesis, lumbar region: Secondary | ICD-10-CM | POA: Diagnosis not present

## 2015-10-10 DIAGNOSIS — Z6830 Body mass index (BMI) 30.0-30.9, adult: Secondary | ICD-10-CM | POA: Diagnosis not present

## 2015-10-10 DIAGNOSIS — M5416 Radiculopathy, lumbar region: Secondary | ICD-10-CM | POA: Diagnosis not present

## 2015-10-10 DIAGNOSIS — I10 Essential (primary) hypertension: Secondary | ICD-10-CM | POA: Diagnosis not present

## 2015-10-17 ENCOUNTER — Telehealth: Payer: Self-pay | Admitting: Cardiovascular Disease

## 2015-10-17 NOTE — Telephone Encounter (Signed)
New Message   Pt c/o of Chest Pain: STAT if CP now or developed within 24 hours  1. Are you having CP right now? Not today, yesterday yes  2. Are you experiencing any other symptoms (ex. SOB, nausea, vomiting, sweating)? No symptoms, only asthma  3. How long have you been experiencing CP? Since yesterday, but has a history of angina and asthma     4. Is your CP continuous or coming and going? Coming and going   5. Have you taken Nitroglycerin? Not prescribed  ?

## 2015-10-17 NOTE — Telephone Encounter (Signed)
Spoke with Georgina Snell at [facility] [phone]).  She st that the patient was there today for a steroid injection in her back, but the procedure was cancelled when she mentioned she had a bout of chest pain that last an hour yesterday.  Per Georgina Snell, the patient was a patient of Dr. Kennon Holter at [facility]. She had a catheterization about 5 years ago and was told it was normal and that her CP was secondary to asthma.  Georgina Snell requests an ASAP appointment for cardiology evaluation because injections will not be given until she is cleared.  Confirmed with Georgina Snell that patient is not currently in distress.  Informed Georgina Snell patient will be called with NP appointment.

## 2015-10-21 ENCOUNTER — Encounter: Payer: Self-pay | Admitting: Cardiology

## 2015-10-22 ENCOUNTER — Institutional Professional Consult (permissible substitution): Payer: Medicare Other | Admitting: Cardiology

## 2015-10-23 ENCOUNTER — Other Ambulatory Visit: Payer: Medicare Other

## 2015-10-29 ENCOUNTER — Ambulatory Visit: Payer: Medicare Other | Admitting: Cardiovascular Disease

## 2015-10-29 ENCOUNTER — Ambulatory Visit (INDEPENDENT_AMBULATORY_CARE_PROVIDER_SITE_OTHER): Payer: Medicare Other | Admitting: Cardiovascular Disease

## 2015-10-29 ENCOUNTER — Encounter: Payer: Self-pay | Admitting: Cardiovascular Disease

## 2015-10-29 ENCOUNTER — Encounter: Payer: Self-pay | Admitting: *Deleted

## 2015-10-29 VITALS — BP 168/94 | HR 74 | Ht 65.0 in | Wt 170.4 lb

## 2015-10-29 DIAGNOSIS — E785 Hyperlipidemia, unspecified: Secondary | ICD-10-CM | POA: Diagnosis not present

## 2015-10-29 DIAGNOSIS — I10 Essential (primary) hypertension: Secondary | ICD-10-CM | POA: Insufficient documentation

## 2015-10-29 DIAGNOSIS — Z8249 Family history of ischemic heart disease and other diseases of the circulatory system: Secondary | ICD-10-CM | POA: Insufficient documentation

## 2015-10-29 DIAGNOSIS — Z01818 Encounter for other preprocedural examination: Secondary | ICD-10-CM

## 2015-10-29 DIAGNOSIS — Z0000 Encounter for general adult medical examination without abnormal findings: Secondary | ICD-10-CM

## 2015-10-29 NOTE — Assessment & Plan Note (Signed)
Father had a myocardial infarction at age 72

## 2015-10-29 NOTE — Assessment & Plan Note (Signed)
History of hyperlipidemia on statin therapy followed by her PCP. 

## 2015-10-29 NOTE — Patient Instructions (Signed)
Medication Instructions:  Your physician recommends that you continue on your current medications as directed. Please refer to the Current Medication list given to you today.   Labwork: NONE  Testing/Procedures: NONE  Follow-Up: Your physician recommends that you schedule a follow-up ON AN AS NEEDED BASIS.  You are cleared for your surgery at low risk. No further testing is needed.  Any Other Special Instructions Will Be Listed Below (If Applicable).     If you need a refill on your cardiac medications before your next appointment, please call your pharmacy.

## 2015-10-29 NOTE — Assessment & Plan Note (Signed)
History of hypertensive blood pressure measured at 168/94. She says her blood pressures usually up in doctor's offices. She is on carvedilol and enalapril Continue current meds at current dose

## 2015-10-29 NOTE — Progress Notes (Signed)
10/29/2015 [name]   23-Jan-1946  [id number]  Primary Physician Henrine Screws, MD Primary Cardiologist: Lorretta Harp MD Renae Gloss  HPI:  Ms Stiffler is a 70 year old widowed Caucasian female mother of one daughter, grandmother and 3 grandchildren who is retired Futures trader. I last saw her in the office 11/23/11. She has a history of hypertension, hyperlipidemia and a strong family history for heart disease with a father who had a myocardial infarction at age 13.I performed cardiac catheterization on her in 2002 revealing normal coronary arteries and normal LV function as well as normal renal arteries. She was living in [place] at one point he moved back to [place]. She also has reactive airways disease. She apparently was going to have a spinal injection by Dr. Sherwood Gambler but reported some atypical chest pain and night before and the procedure was aborted. She really denies chest pain.   Current Outpatient Prescriptions  Medication Sig Dispense Refill  . Ascorbic Acid (VITAMIN C) 1000 MG tablet Take 1,000 mg by mouth daily.    . carvedilol (COREG) 6.25 MG tablet Take 6.25 mg by mouth 2 (two) times daily with a meal.    . enalapril (VASOTEC) 20 MG tablet Take 20 mg by mouth daily.    Marland Kitchen FLUoxetine (PROZAC) 20 MG capsule Take 20 mg by mouth daily.    . hyoscyamine (LEVSIN, ANASPAZ) 0.125 MG tablet Take 0.25 mg by mouth every 6 (six) hours as needed for cramping or diarrhea or loose stools.    Marland Kitchen levothyroxine (SYNTHROID, LEVOTHROID) 88 MCG tablet Take 88 mcg by mouth daily.    . naproxen sodium (ANAPROX) 220 MG tablet Take 220 mg by mouth 2 (two) times daily with a meal.    . omeprazole (PRILOSEC) 20 MG capsule Take 20 mg by mouth daily.    . rosuvastatin (CRESTOR) 10 MG tablet Take 10 mg by mouth daily.    . traMADol (ULTRAM) 50 MG tablet Take 1 tablet (50 mg total) by mouth every 6 (six) hours as needed. 20 tablet 0   No current facility-administered  medications for this visit.    Allergies  Allergen Reactions  . Cortisone Anaphylaxis    Some forms of cortisone  . Monosodium Glutamate Anaphylaxis  . Prednisone Anaphylaxis    confusion  . Shellfish Allergy Anaphylaxis  . Benzoic Acid Nausea Only  . Buprenorphine Hcl Hypertension    Vomiting, shaking, & elevates BP  . Demerol [Meperidine]   . Morphine And Related Other (See Comments)    Vomiting, shaking, & elevates BP  . Sulfa Antibiotics     Social History   Social History  . Marital Status: Widowed    Spouse Name: N/A  . Number of Children: N/A  . Years of Education: N/A   Occupational History  . Not on file.   Social History Main Topics  . Smoking status: Never Smoker   . Smokeless tobacco: Not on file  . Alcohol Use: 0.0 oz/week    0 Standard drinks or equivalent per week     Comment: social  . Drug Use: No  . Sexual Activity: Not on file   Other Topics Concern  . Not on file   Social History Narrative     Review of Systems: General: negative for chills, fever, night sweats or weight changes.  Cardiovascular: negative for chest pain, dyspnea on exertion, edema, orthopnea, palpitations, paroxysmal nocturnal dyspnea or shortness of breath Dermatological: negative for rash Respiratory: negative for cough  or wheezing Urologic: negative for hematuria Abdominal: negative for nausea, vomiting, diarrhea, bright red blood per rectum, melena, or hematemesis Neurologic: negative for visual changes, syncope, or dizziness All other systems reviewed and are otherwise negative except as noted above.    Blood pressure 168/94, pulse 74, height 5\' 5"  (1.651 m), weight 170 lb 6.4 oz (77.293 kg).  General appearance: alert and no distress Neck: no adenopathy, no carotid bruit, no JVD, supple, symmetrical, trachea midline and thyroid not enlarged, symmetric, no tenderness/mass/nodules Lungs: clear to auscultation bilaterally Heart: regular rate and rhythm, S1, S2  normal, no murmur, click, rub or gallop Extremities: extremities normal, atraumatic, no cyanosis or edema  EKG normal sinus rhythm at 74 without ST or T-wave changes. I personally reviewed this EKG  ASSESSMENT AND PLAN:   Family history of heart disease Father had a myocardial infarction at age 67  Essential hypertension History of hypertensive blood pressure measured at 168/94. She says her blood pressures usually up in doctor's offices. She is on carvedilol and enalapril Continue current meds at current dose  Hyperlipidemia History of hyperlipidemia on statin therapy followed by her PCP      Lorretta Harp MD [facility], [facility] 10/29/2015 9:49 AM

## 2015-11-07 DIAGNOSIS — M4726 Other spondylosis with radiculopathy, lumbar region: Secondary | ICD-10-CM | POA: Diagnosis not present

## 2015-11-07 DIAGNOSIS — M5136 Other intervertebral disc degeneration, lumbar region: Secondary | ICD-10-CM | POA: Diagnosis not present

## 2015-11-07 DIAGNOSIS — M4806 Spinal stenosis, lumbar region: Secondary | ICD-10-CM | POA: Diagnosis not present

## 2015-11-25 ENCOUNTER — Other Ambulatory Visit: Payer: Medicare Other

## 2015-11-27 ENCOUNTER — Ambulatory Visit (HOSPITAL_COMMUNITY)
Admission: RE | Admit: 2015-11-27 | Discharge: 2015-11-27 | Disposition: A | Discharge status: Home / Self Care | Payer: Medicare Other | Source: Ambulatory Visit | Attending: Oncology | Admitting: Oncology

## 2015-11-27 ENCOUNTER — Other Ambulatory Visit (INDEPENDENT_AMBULATORY_CARE_PROVIDER_SITE_OTHER): Payer: Medicare Other

## 2015-11-27 ENCOUNTER — Encounter (HOSPITAL_COMMUNITY): Payer: Self-pay

## 2015-11-27 DIAGNOSIS — D509 Iron deficiency anemia, unspecified: Secondary | ICD-10-CM

## 2015-11-27 DIAGNOSIS — N2889 Other specified disorders of kidney and ureter: Secondary | ICD-10-CM | POA: Insufficient documentation

## 2015-11-27 DIAGNOSIS — I700 Atherosclerosis of aorta: Secondary | ICD-10-CM | POA: Diagnosis not present

## 2015-11-27 DIAGNOSIS — N289 Disorder of kidney and ureter, unspecified: Secondary | ICD-10-CM | POA: Diagnosis not present

## 2015-11-27 MED ORDER — IOPAMIDOL (ISOVUE-300) INJECTION 61%
INTRAVENOUS | Status: AC
  Administered 2015-11-27: 75 mL
  Filled 2015-11-27: qty 75

## 2015-11-28 LAB — CBC WITH DIFFERENTIAL/PLATELET
BASOS: 0 %
Basophils Absolute: 0 10*3/uL (ref 0.0–0.2)
EOS (ABSOLUTE): 0.1 10*3/uL (ref 0.0–0.4)
Eos: 1 %
Hematocrit: 43.1 % (ref 34.0–46.6)
Hemoglobin: 14.2 g/dL (ref 11.1–15.9)
IMMATURE GRANULOCYTES: 0 %
Immature Grans (Abs): 0 10*3/uL (ref 0.0–0.1)
Lymphocytes Absolute: 1.3 10*3/uL (ref 0.7–3.1)
Lymphs: 17 %
MCH: 29.2 pg (ref 26.6–33.0)
MCHC: 32.9 g/dL (ref 31.5–35.7)
MCV: 89 fL (ref 79–97)
Monocytes Absolute: 0.5 10*3/uL (ref 0.1–0.9)
Monocytes: 6 %
NEUTROS PCT: 76 %
Neutrophils Absolute: 5.9 10*3/uL (ref 1.4–7.0)
Platelets: 263 10*3/uL (ref 150–379)
RBC: 4.86 x10E6/uL (ref 3.77–5.28)
RDW: 13.4 % (ref 12.3–15.4)
WBC: 7.8 10*3/uL (ref 3.4–10.8)

## 2015-11-28 LAB — POCT I-STAT CREATININE: Creatinine, Ser: 0.7 mg/dL (ref 0.44–1.00)

## 2015-11-28 LAB — FERRITIN: Ferritin: 91 ng/mL (ref 15–150)

## 2015-11-29 ENCOUNTER — Telehealth: Payer: Self-pay | Admitting: *Deleted

## 2015-11-29 NOTE — Telephone Encounter (Signed)
Pt called - no answer; left message "previous spot seen in right kidney almost completely gone - Radiologist feels this was a benign cyst" per Dr Beryle Beams. Also left message "blood count normal; iron levels good" per Der [place]fortuna.  And to call if she has any questions.

## 2015-11-29 NOTE — Telephone Encounter (Signed)
-----   Message from Annia Belt, MD sent at 11/27/2015  5:17 PM EDT ----- Call pt:  previous spot seen in right kidney almost completely gone - Radiologist feels this was a benign cyst

## 2015-12-04 DIAGNOSIS — I700 Atherosclerosis of aorta: Secondary | ICD-10-CM | POA: Diagnosis not present

## 2015-12-04 DIAGNOSIS — F329 Major depressive disorder, single episode, unspecified: Secondary | ICD-10-CM | POA: Diagnosis not present

## 2015-12-04 DIAGNOSIS — I10 Essential (primary) hypertension: Secondary | ICD-10-CM | POA: Diagnosis not present

## 2015-12-04 DIAGNOSIS — R197 Diarrhea, unspecified: Secondary | ICD-10-CM | POA: Diagnosis not present

## 2015-12-26 DIAGNOSIS — G250 Essential tremor: Secondary | ICD-10-CM | POA: Diagnosis not present

## 2015-12-26 DIAGNOSIS — I10 Essential (primary) hypertension: Secondary | ICD-10-CM | POA: Diagnosis not present

## 2015-12-26 DIAGNOSIS — I700 Atherosclerosis of aorta: Secondary | ICD-10-CM | POA: Diagnosis not present

## 2015-12-26 DIAGNOSIS — F329 Major depressive disorder, single episode, unspecified: Secondary | ICD-10-CM | POA: Diagnosis not present

## 2015-12-26 DIAGNOSIS — J329 Chronic sinusitis, unspecified: Secondary | ICD-10-CM | POA: Diagnosis not present

## 2015-12-26 DIAGNOSIS — R197 Diarrhea, unspecified: Secondary | ICD-10-CM | POA: Diagnosis not present

## 2016-01-09 DIAGNOSIS — Z961 Presence of intraocular lens: Secondary | ICD-10-CM | POA: Diagnosis not present

## 2016-01-24 DIAGNOSIS — Z23 Encounter for immunization: Secondary | ICD-10-CM | POA: Diagnosis not present

## 2016-01-24 DIAGNOSIS — L720 Epidermal cyst: Secondary | ICD-10-CM | POA: Diagnosis not present

## 2016-02-25 ENCOUNTER — Other Ambulatory Visit: Payer: Self-pay | Admitting: Oncology

## 2016-02-25 DIAGNOSIS — D508 Other iron deficiency anemias: Secondary | ICD-10-CM

## 2016-02-26 ENCOUNTER — Other Ambulatory Visit: Payer: Medicare Other

## 2016-04-15 DIAGNOSIS — K219 Gastro-esophageal reflux disease without esophagitis: Secondary | ICD-10-CM | POA: Diagnosis not present

## 2016-04-15 DIAGNOSIS — L820 Inflamed seborrheic keratosis: Secondary | ICD-10-CM | POA: Diagnosis not present

## 2016-04-15 DIAGNOSIS — Z0000 Encounter for general adult medical examination without abnormal findings: Secondary | ICD-10-CM | POA: Diagnosis not present

## 2016-04-15 DIAGNOSIS — R8299 Other abnormal findings in urine: Secondary | ICD-10-CM | POA: Diagnosis not present

## 2016-04-15 DIAGNOSIS — I10 Essential (primary) hypertension: Secondary | ICD-10-CM | POA: Diagnosis not present

## 2016-04-15 DIAGNOSIS — I700 Atherosclerosis of aorta: Secondary | ICD-10-CM | POA: Diagnosis not present

## 2016-04-15 DIAGNOSIS — L853 Xerosis cutis: Secondary | ICD-10-CM | POA: Diagnosis not present

## 2016-04-15 DIAGNOSIS — Z1389 Encounter for screening for other disorder: Secondary | ICD-10-CM | POA: Diagnosis not present

## 2016-04-15 DIAGNOSIS — D509 Iron deficiency anemia, unspecified: Secondary | ICD-10-CM | POA: Diagnosis not present

## 2016-04-15 DIAGNOSIS — G250 Essential tremor: Secondary | ICD-10-CM | POA: Diagnosis not present

## 2016-04-15 DIAGNOSIS — E559 Vitamin D deficiency, unspecified: Secondary | ICD-10-CM | POA: Diagnosis not present

## 2016-04-15 DIAGNOSIS — F329 Major depressive disorder, single episode, unspecified: Secondary | ICD-10-CM | POA: Diagnosis not present

## 2016-04-15 DIAGNOSIS — K8689 Other specified diseases of pancreas: Secondary | ICD-10-CM | POA: Diagnosis not present

## 2016-04-15 DIAGNOSIS — F322 Major depressive disorder, single episode, severe without psychotic features: Secondary | ICD-10-CM | POA: Diagnosis not present

## 2016-04-15 DIAGNOSIS — J329 Chronic sinusitis, unspecified: Secondary | ICD-10-CM | POA: Diagnosis not present

## 2016-04-15 DIAGNOSIS — R159 Full incontinence of feces: Secondary | ICD-10-CM | POA: Diagnosis not present

## 2016-04-15 DIAGNOSIS — Z79899 Other long term (current) drug therapy: Secondary | ICD-10-CM | POA: Diagnosis not present

## 2016-04-28 ENCOUNTER — Encounter: Payer: Self-pay | Admitting: Gastroenterology

## 2016-05-06 DIAGNOSIS — L281 Prurigo nodularis: Secondary | ICD-10-CM | POA: Diagnosis not present

## 2016-05-06 DIAGNOSIS — D1801 Hemangioma of skin and subcutaneous tissue: Secondary | ICD-10-CM | POA: Diagnosis not present

## 2016-05-06 DIAGNOSIS — L308 Other specified dermatitis: Secondary | ICD-10-CM | POA: Diagnosis not present

## 2016-05-06 DIAGNOSIS — D225 Melanocytic nevi of trunk: Secondary | ICD-10-CM | POA: Diagnosis not present

## 2016-05-06 DIAGNOSIS — L821 Other seborrheic keratosis: Secondary | ICD-10-CM | POA: Diagnosis not present

## 2016-05-28 ENCOUNTER — Emergency Department (HOSPITAL_COMMUNITY): Payer: Medicare Other

## 2016-05-28 ENCOUNTER — Encounter (HOSPITAL_COMMUNITY): Payer: Self-pay

## 2016-05-28 ENCOUNTER — Inpatient Hospital Stay (HOSPITAL_COMMUNITY)
Admission: EM | Admit: 2016-05-28 | Discharge: 2016-06-07 | DRG: 480 | Disposition: A | Discharge status: Skilled Nursing Facility | Payer: Medicare Other | Attending: Internal Medicine | Admitting: Internal Medicine

## 2016-05-28 DIAGNOSIS — Z8249 Family history of ischemic heart disease and other diseases of the circulatory system: Secondary | ICD-10-CM | POA: Diagnosis not present

## 2016-05-28 DIAGNOSIS — S299XXA Unspecified injury of thorax, initial encounter: Secondary | ICD-10-CM | POA: Diagnosis not present

## 2016-05-28 DIAGNOSIS — Z91013 Allergy to seafood: Secondary | ICD-10-CM | POA: Diagnosis not present

## 2016-05-28 DIAGNOSIS — I25110 Atherosclerotic heart disease of native coronary artery with unstable angina pectoris: Secondary | ICD-10-CM | POA: Diagnosis present

## 2016-05-28 DIAGNOSIS — K219 Gastro-esophageal reflux disease without esophagitis: Secondary | ICD-10-CM | POA: Diagnosis present

## 2016-05-28 DIAGNOSIS — I10 Essential (primary) hypertension: Secondary | ICD-10-CM | POA: Diagnosis present

## 2016-05-28 DIAGNOSIS — K8689 Other specified diseases of pancreas: Secondary | ICD-10-CM | POA: Diagnosis present

## 2016-05-28 DIAGNOSIS — R0789 Other chest pain: Secondary | ICD-10-CM | POA: Diagnosis not present

## 2016-05-28 DIAGNOSIS — E785 Hyperlipidemia, unspecified: Secondary | ICD-10-CM | POA: Diagnosis present

## 2016-05-28 DIAGNOSIS — I214 Non-ST elevation (NSTEMI) myocardial infarction: Secondary | ICD-10-CM | POA: Diagnosis not present

## 2016-05-28 DIAGNOSIS — E039 Hypothyroidism, unspecified: Secondary | ICD-10-CM | POA: Diagnosis present

## 2016-05-28 DIAGNOSIS — S72143A Displaced intertrochanteric fracture of unspecified femur, initial encounter for closed fracture: Secondary | ICD-10-CM | POA: Diagnosis not present

## 2016-05-28 DIAGNOSIS — K449 Diaphragmatic hernia without obstruction or gangrene: Secondary | ICD-10-CM | POA: Diagnosis present

## 2016-05-28 DIAGNOSIS — W000XXA Fall on same level due to ice and snow, initial encounter: Secondary | ICD-10-CM | POA: Diagnosis present

## 2016-05-28 DIAGNOSIS — R079 Chest pain, unspecified: Secondary | ICD-10-CM | POA: Diagnosis not present

## 2016-05-28 DIAGNOSIS — Z79899 Other long term (current) drug therapy: Secondary | ICD-10-CM

## 2016-05-28 DIAGNOSIS — Z6828 Body mass index (BMI) 28.0-28.9, adult: Secondary | ICD-10-CM | POA: Diagnosis not present

## 2016-05-28 DIAGNOSIS — I2510 Atherosclerotic heart disease of native coronary artery without angina pectoris: Secondary | ICD-10-CM

## 2016-05-28 DIAGNOSIS — R262 Difficulty in walking, not elsewhere classified: Secondary | ICD-10-CM

## 2016-05-28 DIAGNOSIS — Y93K1 Activity, walking an animal: Secondary | ICD-10-CM | POA: Diagnosis not present

## 2016-05-28 DIAGNOSIS — S72001A Fracture of unspecified part of neck of right femur, initial encounter for closed fracture: Secondary | ICD-10-CM | POA: Diagnosis present

## 2016-05-28 DIAGNOSIS — S72144A Nondisplaced intertrochanteric fracture of right femur, initial encounter for closed fracture: Secondary | ICD-10-CM | POA: Diagnosis not present

## 2016-05-28 DIAGNOSIS — I200 Unstable angina: Secondary | ICD-10-CM | POA: Diagnosis not present

## 2016-05-28 DIAGNOSIS — Z91041 Radiographic dye allergy status: Secondary | ICD-10-CM | POA: Diagnosis not present

## 2016-05-28 DIAGNOSIS — E669 Obesity, unspecified: Secondary | ICD-10-CM | POA: Diagnosis present

## 2016-05-28 DIAGNOSIS — Z888 Allergy status to other drugs, medicaments and biological substances status: Secondary | ICD-10-CM | POA: Diagnosis not present

## 2016-05-28 DIAGNOSIS — Z9861 Coronary angioplasty status: Secondary | ICD-10-CM

## 2016-05-28 DIAGNOSIS — W19XXXA Unspecified fall, initial encounter: Secondary | ICD-10-CM | POA: Diagnosis not present

## 2016-05-28 DIAGNOSIS — Z538 Procedure and treatment not carried out for other reasons: Secondary | ICD-10-CM | POA: Diagnosis not present

## 2016-05-28 DIAGNOSIS — Z90710 Acquired absence of both cervix and uterus: Secondary | ICD-10-CM

## 2016-05-28 DIAGNOSIS — M25551 Pain in right hip: Secondary | ICD-10-CM | POA: Diagnosis present

## 2016-05-28 DIAGNOSIS — J189 Pneumonia, unspecified organism: Secondary | ICD-10-CM | POA: Diagnosis not present

## 2016-05-28 DIAGNOSIS — Z419 Encounter for procedure for purposes other than remedying health state, unspecified: Secondary | ICD-10-CM

## 2016-05-28 DIAGNOSIS — Y95 Nosocomial condition: Secondary | ICD-10-CM | POA: Diagnosis not present

## 2016-05-28 DIAGNOSIS — J45909 Unspecified asthma, uncomplicated: Secondary | ICD-10-CM | POA: Diagnosis present

## 2016-05-28 DIAGNOSIS — S72001D Fracture of unspecified part of neck of right femur, subsequent encounter for closed fracture with routine healing: Secondary | ICD-10-CM | POA: Diagnosis not present

## 2016-05-28 DIAGNOSIS — D509 Iron deficiency anemia, unspecified: Secondary | ICD-10-CM | POA: Diagnosis present

## 2016-05-28 DIAGNOSIS — Z882 Allergy status to sulfonamides status: Secondary | ICD-10-CM | POA: Diagnosis not present

## 2016-05-28 DIAGNOSIS — D508 Other iron deficiency anemias: Secondary | ICD-10-CM | POA: Diagnosis not present

## 2016-05-28 DIAGNOSIS — Z955 Presence of coronary angioplasty implant and graft: Secondary | ICD-10-CM

## 2016-05-28 DIAGNOSIS — I248 Other forms of acute ischemic heart disease: Secondary | ICD-10-CM | POA: Diagnosis not present

## 2016-05-28 DIAGNOSIS — S72141A Displaced intertrochanteric fracture of right femur, initial encounter for closed fracture: Secondary | ICD-10-CM | POA: Diagnosis not present

## 2016-05-28 DIAGNOSIS — S72011A Unspecified intracapsular fracture of right femur, initial encounter for closed fracture: Secondary | ICD-10-CM | POA: Diagnosis not present

## 2016-05-28 DIAGNOSIS — Z09 Encounter for follow-up examination after completed treatment for conditions other than malignant neoplasm: Secondary | ICD-10-CM

## 2016-05-28 HISTORY — DX: Fracture of unspecified part of neck of right femur, initial encounter for closed fracture: S72.001A

## 2016-05-28 LAB — CBC WITH DIFFERENTIAL/PLATELET
BASOS PCT: 0 %
Basophils Absolute: 0 10*3/uL (ref 0.0–0.1)
EOS ABS: 0.1 10*3/uL (ref 0.0–0.7)
EOS PCT: 0 %
HCT: 41.9 % (ref 36.0–46.0)
HEMOGLOBIN: 13.8 g/dL (ref 12.0–15.0)
LYMPHS ABS: 1.7 10*3/uL (ref 0.7–4.0)
Lymphocytes Relative: 12 %
MCH: 29.2 pg (ref 26.0–34.0)
MCHC: 32.9 g/dL (ref 30.0–36.0)
MCV: 88.6 fL (ref 78.0–100.0)
MONOS PCT: 5 %
Monocytes Absolute: 0.7 10*3/uL (ref 0.1–1.0)
NEUTROS PCT: 83 %
Neutro Abs: 11.5 10*3/uL — ABNORMAL HIGH (ref 1.7–7.7)
PLATELETS: 203 10*3/uL (ref 150–400)
RBC: 4.73 MIL/uL (ref 3.87–5.11)
RDW: 13.4 % (ref 11.5–15.5)
WBC: 14.1 10*3/uL — AB (ref 4.0–10.5)

## 2016-05-28 LAB — BASIC METABOLIC PANEL
Anion gap: 13 (ref 5–15)
BUN: 14 mg/dL (ref 6–20)
CALCIUM: 8.9 mg/dL (ref 8.9–10.3)
CO2: 21 mmol/L — AB (ref 22–32)
CREATININE: 0.78 mg/dL (ref 0.44–1.00)
Chloride: 104 mmol/L (ref 101–111)
GFR calc non Af Amer: >60 mL/min (ref >60)
Glucose, Bld: 139 mg/dL — ABNORMAL HIGH (ref 65–99)
Potassium: 3.6 mmol/L (ref 3.5–5.1)
Sodium: 138 mmol/L (ref 135–145)

## 2016-05-28 LAB — TYPE AND SCREEN
ABO/RH(D): A POS
Antibody Screen: NEGATIVE

## 2016-05-28 LAB — PROTIME-INR
INR: 1.07
PROTHROMBIN TIME: 13.9 s (ref 11.4–15.2)

## 2016-05-28 LAB — ABO/RH: ABO/RH(D): A POS

## 2016-05-28 MED ORDER — OXYCODONE HCL 5 MG PO TABS
5.0000 mg | ORAL_TABLET | ORAL | Status: DC | PRN
  Administered 2016-05-29 – 2016-05-30 (×3): 5 mg via ORAL
  Filled 2016-05-28 (×3): qty 1

## 2016-05-28 MED ORDER — POLYETHYLENE GLYCOL 3350 17 G PO PACK
17.0000 g | PACK | Freq: Every day | ORAL | Status: DC | PRN
  Administered 2016-06-01 – 2016-06-02 (×2): 17 g via ORAL
  Filled 2016-05-28 (×2): qty 1

## 2016-05-28 MED ORDER — LEVOTHYROXINE SODIUM 88 MCG PO TABS
88.0000 ug | ORAL_TABLET | Freq: Every day | ORAL | Status: DC
  Administered 2016-05-29 – 2016-06-07 (×8): 88 ug via ORAL
  Filled 2016-05-28 (×9): qty 1

## 2016-05-28 MED ORDER — BISACODYL 10 MG RE SUPP
10.0000 mg | Freq: Every day | RECTAL | Status: DC | PRN
  Administered 2016-06-02: 10 mg via RECTAL
  Filled 2016-05-28: qty 1

## 2016-05-28 MED ORDER — HYDROMORPHONE HCL 2 MG/ML IJ SOLN
1.0000 mg | Freq: Once | INTRAMUSCULAR | Status: AC
  Administered 2016-05-28: 1 mg via INTRAVENOUS
  Filled 2016-05-28: qty 1

## 2016-05-28 MED ORDER — ACETAMINOPHEN 500 MG PO TABS
1000.0000 mg | ORAL_TABLET | Freq: Three times a day (TID) | ORAL | Status: DC
  Administered 2016-05-29 – 2016-06-03 (×16): 1000 mg via ORAL
  Filled 2016-05-28 (×17): qty 2

## 2016-05-28 MED ORDER — CARVEDILOL 6.25 MG PO TABS
6.2500 mg | ORAL_TABLET | Freq: Two times a day (BID) | ORAL | Status: DC
  Administered 2016-05-29 – 2016-05-31 (×3): 6.25 mg via ORAL
  Filled 2016-05-28 (×2): qty 1
  Filled 2016-05-28: qty 2

## 2016-05-28 MED ORDER — HYDROMORPHONE HCL 2 MG/ML IJ SOLN
0.5000 mg | INTRAMUSCULAR | Status: DC | PRN
  Administered 2016-05-29 – 2016-05-30 (×8): 0.5 mg via INTRAVENOUS
  Filled 2016-05-28 (×8): qty 1

## 2016-05-28 MED ORDER — METHOCARBAMOL 500 MG PO TABS: 1000.0000 mg | ORAL_TABLET | Freq: Three times a day (TID) | ORAL | Status: DC | PRN

## 2016-05-28 MED ORDER — SODIUM CHLORIDE 0.9 % IV SOLN
INTRAVENOUS | Status: DC
  Administered 2016-05-29 (×3): via INTRAVENOUS

## 2016-05-28 MED ORDER — DEXTROSE 5 % IV SOLN
1000.0000 mg | Freq: Once | INTRAVENOUS | Status: AC
  Administered 2016-05-28: 1000 mg via INTRAVENOUS
  Filled 2016-05-28: qty 10

## 2016-05-28 MED ORDER — SODIUM CHLORIDE 0.9 % IV SOLN
INTRAVENOUS | Status: DC
  Administered 2016-05-28: 21:00:00 via INTRAVENOUS

## 2016-05-28 MED ORDER — PANTOPRAZOLE SODIUM 40 MG PO TBEC
40.0000 mg | DELAYED_RELEASE_TABLET | Freq: Every day | ORAL | Status: DC
  Administered 2016-05-29: 40 mg via ORAL
  Filled 2016-05-28: qty 1

## 2016-05-28 MED ORDER — ROSUVASTATIN CALCIUM 10 MG PO TABS
10.0000 mg | ORAL_TABLET | ORAL | Status: DC
  Administered 2016-05-29 – 2016-06-06 (×5): 10 mg via ORAL
  Filled 2016-05-28 (×6): qty 1

## 2016-05-28 MED ORDER — DOCUSATE SODIUM 100 MG PO CAPS
100.0000 mg | ORAL_CAPSULE | Freq: Two times a day (BID) | ORAL | Status: DC
  Administered 2016-05-29 – 2016-06-03 (×10): 100 mg via ORAL
  Filled 2016-05-28 (×10): qty 1

## 2016-05-28 MED ORDER — ONDANSETRON HCL 4 MG/2ML IJ SOLN
4.0000 mg | Freq: Once | INTRAMUSCULAR | Status: AC
  Administered 2016-05-28: 4 mg via INTRAVENOUS
  Filled 2016-05-28: qty 2

## 2016-05-28 NOTE — ED Notes (Signed)
Pts jewelry put into specimen container. Jewelry placed in pts jacket pocket and clothing put into pt belonging bag.

## 2016-05-28 NOTE — ED Notes (Signed)
Patient transported to X-ray 

## 2016-05-28 NOTE — H&P (Signed)
History and Physical  Patient Name: Caitlyn [name]     [id number]    DOB: 1945-05-19    DOA: 05/28/2016 Referring provider: Sula Rumple, PA-C PCP: Henrine Screws, MD  Outpatient specialists: Dr. Beryle Beams, Heme    Dr. [name], Cardiology   Patient coming from: Home via EMS     Chief Complaint: Hip pain and fall  HPI: Caitlyn [name] is a 71 y.o. female with a past medical history significant for hypothyroidism, HTN and iron def anemia who presents with hip pain after a fall.  The patient was in her normal state of health until this evening when she was walking her dog, slipped on the ice, fell on her right hip and had immediate RIGHT hip pain and could not walk.    ED course: -Afebrile, a 73, respirations and pulse ox normal, blood pressure 192/83, desat with pain medicine  -Sodium 138, potassium 3.6, creatinine 0.78, WBCs 14.1 K, hemoglobin 13.8 -INR normal -Radiograph the right hip showed a nondisplaced right intertrochanteric fracture  -Chest x-ray clear -The case was discussed with Dr. Rolena Infante recommended hospitalization admission plan for surgery tomorrow      There was no preceding dizziness, weakness, lightheadedness or vertigo, no chest discomfort, palpitations, or dyspnea, nor are there any of those symptoms now.  The patient denies active heart issues, angina, or history of MI. She exerts to an equivalent of 4 METS without dyspnea.  She denies history of TIAs/CVAs, CAD, CHF, or DM treated with insulin. She has asthma but uses no inhalers, has no history of OSA, including snoring, daytime drowsiness, apnea.  She has no history of prolonged steroid use and does not use insulin.     Anesthesia Specific concerns: Presence of loose teeth: None Anesthesia problems in past: Yes, unable to tell me what History of bleeding disorder: None  Review of Systems:  Review of Systems  Musculoskeletal: Positive for joint pain.  All other systems reviewed and are  negative.         Past Medical History:  Diagnosis Date  . Anemia   . Arthritis   . Bleeding diathesis ([place]) 02/22/2013  . Coronary artery disease   . E coli enteritis   . GERD (gastroesophageal reflux disease)   . H/O hiatal hernia   . Head injury   . Hypertension   . Iron deficiency anemia, unspecified 02/22/2013  . Mass of right kidney 08/22/2013   2.5 cm solid lesion seen on 5/14 CT  . Radicular pain of thoracic region 08/22/2013   Left T-8 started after pushing heavy chest 1/15; intermittent; no focal neuro deficit    Past Surgical History:  Procedure Laterality Date  . ABDOMINAL HYSTERECTOMY    . BREAST REDUCTION SURGERY  1981  . BREAST SURGERY  1981   rt-nodes taken out-non cancer  . BREAST SURGERY  1981   rt lumpectomy  . COLONOSCOPY    . FACIAL FRACTURE SURGERY  2000  . TONSILLECTOMY    . TRIGGER FINGER RELEASE Right 11/28/2013   Procedure: RELEASE TRIGGER FINGER/A-1 PULLEY RIGHT LONG FINGER;  Surgeon: Cammie Sickle, MD;  Location: [facility];  Service: Orthopedics;  Laterality: Right;    Social History: Patient lives alone.  Patient walks unassisted.  She is a never smoker.  She is from [place].  She was an Futures trader, now retired.    Allergies  Allergen Reactions  . Cortisone Anaphylaxis    Some forms of cortisone  . Monosodium Glutamate Anaphylaxis  . Prednisone  Anaphylaxis    confusion  . Shellfish Allergy Anaphylaxis  . Readi-Cat [Barium Sulfate] Diarrhea    Patient does not want to drink Readi-Cat because it gives her diarrhea  . Benzoic Acid Nausea Only  . Buprenorphine Hcl Hypertension    Vomiting, shaking, & elevates BP  . Demerol [Meperidine]   . Morphine And Related Other (See Comments)    Vomiting, shaking, & elevates BP  . Sulfa Antibiotics     Family history: family history includes Heart attack in her father.  Prior to Admission medications   Medication Sig Start Date End Date Taking? Authorizing  Provider  carvedilol (COREG) 6.25 MG tablet Take 6.25 mg by mouth 2 (two) times daily with a meal.   Yes Historical Provider, MD  enalapril (VASOTEC) 20 MG tablet Take 20 mg by mouth daily.   Yes Historical Provider, MD  levothyroxine (SYNTHROID, LEVOTHROID) 88 MCG tablet Take 88 mcg by mouth daily.   Yes Historical Provider, MD  naproxen sodium (ANAPROX) 220 MG tablet Take 220 mg by mouth 2 (two) times daily with a meal.   Yes Historical Provider, MD  omeprazole (PRILOSEC) 20 MG capsule Take 20 mg by mouth daily.   Yes Historical Provider, MD  rosuvastatin (CRESTOR) 10 MG tablet Take 10 mg by mouth every other day.    Yes Historical Provider, MD       Physical Exam: BP (!) 118/49   Pulse 79   Temp 98.3 F (36.8 C) (Oral)   Resp 16   Ht 5\' 6"  (1.676 m)   Wt 79.4 kg (175 lb)   SpO2 90%   BMI 28.25 kg/m  General appearance: Well-developed, elderly adult female, alert.  Initially in no distress, then in severe distress from pain spasm.   Eyes: Anicteric, conjunctiva pink, lids and lashes normal. PERRL.    ENT: No nasal deformity, discharge, epistaxis.  Hearing normal. OP moist without lesions.  Dentition normal.    Skin: Warm and dry.  No jaundice.  No suspicious rashes or lesions. Cardiac: RRR, nl S1-S2, no murmurs appreciated.  Capillary refill is brisk.  No JVD.  No LE edema.  Radial and DP pulses 2+ and symmetric.   Respiratory: Normal respiratory rate and rhythm.  CTAB without rales or wheezes. GI: Abdomen soft without rigidity.  No TTP. No ascites, distension, no hepatosplenomegaly.  MSK: No leg foreshortening.  No effusions.  No clubbing or cyanosis. Neuro: Cranial nerves 3-12 intact.  Sensorium intact and responding to questions, attention normal.  Speech is fluent.  Moves all extremities equally and with normal coordination except right leg, limited by pain.    Psych: The patient is oriented to time, place and person. Behavior appropriate.  Affect normal, later distorted by  pain.  Recall, recent and remote, as well as general fund of knowledge seem within normal limits. No evidence of aural or visual hallucinations or delusions.     Labs on Admission:  I have personally reviewed following labs and imaging studies: CBC:  Recent Labs Lab 05/28/16 1954  WBC 14.1*  NEUTROABS 11.5*  HGB 13.8  HCT 41.9  MCV 88.6  PLT 123456   Basic Metabolic Panel:  Recent Labs Lab 05/28/16 1954  NA 138  K 3.6  CL 104  CO2 21*  GLUCOSE 139*  BUN 14  CREATININE 0.78  CALCIUM 8.9   GFR: Estimated Creatinine Clearance: 67.5 mL/min (by C-G formula based on SCr of 0.78 mg/dL).  Liver Function Tests: No results for input(s): AST, ALT, ALKPHOS,  BILITOT, PROT, ALBUMIN in the last 168 hours. No results for input(s): LIPASE, AMYLASE in the last 168 hours. No results for input(s): AMMONIA in the last 168 hours. Coagulation Profile:  Recent Labs Lab 05/28/16 1954  INR 1.07   Cardiac Enzymes: No results for input(s): CKTOTAL, CKMB, CKMBINDEX, TROPONINI in the last 168 hours. BNP (last 3 results) No results for input(s): PROBNP in the last 8760 hours. HbA1C: No results for input(s): HGBA1C in the last 72 hours. CBG: No results for input(s): GLUCAP in the last 168 hours. Lipid Profile: No results for input(s): CHOL, HDL, LDLCALC, TRIG, CHOLHDL, LDLDIRECT in the last 72 hours. Thyroid Function Tests: No results for input(s): TSH, T4TOTAL, FREET4, T3FREE, THYROIDAB in the last 72 hours. Anemia Panel: No results for input(s): VITAMINB12, FOLATE, FERRITIN, TIBC, IRON, RETICCTPCT in the last 72 hours.    Radiological Exams on Admission: Personally reviewed CXR shows no focal opacity or pneumonia; hip radiograph reviewed: Dg Chest 1 View  Result Date: 05/28/2016 CLINICAL DATA:  Status post slip and fall on ice with a hip injury today. EXAM: CHEST 1 VIEW COMPARISON:  PA and lateral chest 11/23/2010. FINDINGS: Lungs are clear. Heart size is normal. Prominent hiatal  hernia is seen. No pneumothorax or pleural effusion. Aortic atherosclerosis is identified. No acute bony abnormality. IMPRESSION: No acute disease. Atherosclerosis. Hiatal hernia. Electronically Signed   By: Inge Rise M.D.   On: 05/28/2016 20:34   Dg Hip Unilat With Pelvis 2-3 Views Right  Result Date: 05/28/2016 CLINICAL DATA:  Pain after fall. EXAM: DG HIP (WITH OR WITHOUT PELVIS) 2-3V RIGHT COMPARISON:  None. FINDINGS: There is a lucency extending through the inferior trochanter into the femoral neck with increased sclerosis across the intertrochanteric region. The findings are consistent with a subtle nondisplaced intertrochanteric fracture. The left hip and pelvic bones are otherwise normal. IMPRESSION: Subtle nondisplaced intertrochanteric fracture on the right. Electronically Signed   By: Dorise Bullion III M.D   On: 05/28/2016 20:33        Assessment and Plan: 1. Hip fracture: The patient will be seen by Dr. Rolena Infante in the morning at [facility], to evaluate for operative fixation of the RIGHT hip.   -Admit to med-surg bed -Oxycodone or hydromorphone as tolerated for pain -Robaxin IV once then PO for three doses for spasm -Bed rest, apply ice, document sedation and vitals per Hip fracture protocol -NPO at midnight -MIVF -Nutrition consulted -Hold following meds:  -Enalapril   Overall, the patient is at low risk for the planned surgery.  Patient has a RCRI score of 0 (active cardiac condition, CHF, CAD, DM treated with insulin, TIA/CVA, Cr > 2.0). The patient has no active cardiac symptoms, a functional capacity >4 METs, and would be expected to be at average risk for cardiac complications from this intermediate risk procedure. -No further testing needed  Pulmonary: Patient does not have diagnosed COPD or OSA. Patient is at average risk for pulmonary complications.   Endocrine: Patient has no history of steroid use or DM.  Heme: Last ferritin 90 in July.  Transfusion threshold  7 mg/dL.   Post-operative medical care: Per AAOS 2014 guidelines on hip fractures in the elderly: -Recommend osteoporosis screening after discharge if not done previously -Recommend vitamin D 800 IUand calcium 1200 mg supplementation after discharge       2. Hypertension: -Continue BB and statin -Hold ACEi pre-operatively  3. Iron deficiency anemia: Hgb normal today.  Ferritin 90s in July.  Is intolerant to oral iron,  gets iron infusions with Dr. Beryle Beams. -Check ferritin  4. Hypothyroidism: -Continue levothyroxine  5. Other medications: -Continue PPI         DVT prophylaxis: SCDs  Diet: NPO at midnight Code Status: FULL  Family Communication: None present  Disposition Plan: Anticipate evaluation by Orthopedics and surgical fixation tomorrow, then PT evaluation and discharge to SNF in 2-3 days. Admission status: INPATIENT for hip fracture, medical surgical bed     Medical decision making and consults: Patient seen at 10:30 PM on 05/28/2016.  The patient was discussed with Will Dansie, PA-C. What exists of the patient's chart was reviewed in depth and summarized above.  Clinical condition: stalble.      Edwin Dada [facility] Pager [phone]

## 2016-05-28 NOTE — ED Notes (Signed)
Admitting provider at bedside.

## 2016-05-28 NOTE — ED Notes (Addendum)
Pt repeatedly removing nasal cannula. Explained to pt need for O2 as oxygen saturation is low. Pt verbalized understanding but reports "it bothers me/it make me feel hot". Emphasized to pt importance of oxygen and suggested an oxygen mask but pt reports that will "bother her more".

## 2016-05-28 NOTE — ED Notes (Signed)
Attempted to call report x 1  

## 2016-05-28 NOTE — ED Notes (Signed)
ED Provider at bedside. 

## 2016-05-28 NOTE — ED Triage Notes (Signed)
Pt brought in by EMS due falling on ice while walking dog. Pt fell right hip and has stabbing pain in groin. Pt a&ox4. Pt denies LOC, hitting head, or neck/back pain.

## 2016-05-28 NOTE — ED Notes (Signed)
Attempted report x 2 

## 2016-05-28 NOTE — ED Provider Notes (Signed)
[facility] DEPT Provider Note   CSN: [id number] Arrival date & time: 05/28/16  1916     History   Chief Complaint Chief Complaint  Patient presents with  . Hip Pain    HPI Caitlyn [name] is a 71 y.o. female.  Caitlyn [name] is a 71 y.o. Female who presents to the emergency department after a slip and fall while walking her dog tonight. Patient reports she was walking outside in the snow and slipped on some ice landing on her right hip. She denies having her head or loss of consciousness. She complains of pain to her right hip and into her groin area. She reports pain is worse with movement. She is not on anticoagulants. She received 250 g of fentanyl by EMS prior to arrival. Patient denies head injury, loss of consciousness, neck pain, back pain, numbness, tingling, weakness, chest pain, SOB, abdominal pain, nausea or vomiting.    The history is provided by the patient. No language interpreter was used.  Hip Pain  Pertinent negatives include no chest pain, no abdominal pain, no headaches and no shortness of breath.    Past Medical History:  Diagnosis Date  . Anemia   . Arthritis   . Bleeding diathesis ([place]) 02/22/2013  . Coronary artery disease   . E coli enteritis   . GERD (gastroesophageal reflux disease)   . H/O hiatal hernia   . Head injury   . Hypertension   . Iron deficiency anemia, unspecified 02/22/2013  . Mass of right kidney 08/22/2013   2.5 cm solid lesion seen on 5/14 CT  . Radicular pain of thoracic region 08/22/2013   Left T-8 started after pushing heavy chest 1/15; intermittent; no focal neuro deficit    Patient Active Problem List   Diagnosis Date Noted  . Hypothyroidism, acquired 05/28/2016  . Nondisplaced intertrochanteric fracture of right femur, initial encounter for closed fracture ([place]) 05/28/2016  . Essential hypertension 10/29/2015  . Hyperlipidemia 10/29/2015  . Family history of heart disease 10/29/2015  . Radicular pain of thoracic region  08/22/2013  . Mass of right kidney 08/22/2013  . Iron deficiency anemia 02/22/2013    Past Surgical History:  Procedure Laterality Date  . ABDOMINAL HYSTERECTOMY    . BREAST REDUCTION SURGERY  1981  . BREAST SURGERY  1981   rt-nodes taken out-non cancer  . BREAST SURGERY  1981   rt lumpectomy  . COLONOSCOPY    . FACIAL FRACTURE SURGERY  2000  . TONSILLECTOMY    . TRIGGER FINGER RELEASE Right 11/28/2013   Procedure: RELEASE TRIGGER FINGER/A-1 PULLEY RIGHT LONG FINGER;  Surgeon: Cammie Sickle, MD;  Location: [facility];  Service: Orthopedics;  Laterality: Right;    OB History    No data available       Home Medications    Prior to Admission medications   Medication Sig Start Date End Date Taking? Authorizing Provider  carvedilol (COREG) 6.25 MG tablet Take 6.25 mg by mouth 2 (two) times daily with a meal.   Yes Historical Provider, MD  enalapril (VASOTEC) 20 MG tablet Take 20 mg by mouth daily.   Yes Historical Provider, MD  levothyroxine (SYNTHROID, LEVOTHROID) 88 MCG tablet Take 88 mcg by mouth daily.   Yes Historical Provider, MD  naproxen sodium (ANAPROX) 220 MG tablet Take 220 mg by mouth 2 (two) times daily with a meal.   Yes Historical Provider, MD  omeprazole (PRILOSEC) 20 MG capsule Take 20 mg by mouth daily.  Yes Historical Provider, MD  rosuvastatin (CRESTOR) 10 MG tablet Take 10 mg by mouth every other day.    Yes Historical Provider, MD    Family History Family History  Problem Relation Age of Onset  . Heart attack Father     Social History Social History  Substance Use Topics  . Smoking status: Never Smoker  . Smokeless tobacco: Never Used  . Alcohol use 0.0 oz/week     Comment: social     Allergies   Cortisone; Monosodium glutamate; Prednisone; Shellfish allergy; Readi-cat [barium sulfate]; Benzoic acid; Buprenorphine hcl; Demerol [meperidine]; Morphine and related; and Sulfa antibiotics   Review of Systems Review of Systems    Constitutional: Negative for fever.  HENT: Negative for nosebleeds and sore throat.   Eyes: Negative for visual disturbance.  Respiratory: Negative for cough and shortness of breath.   Cardiovascular: Negative for chest pain.  Gastrointestinal: Negative for abdominal pain, nausea and vomiting.  Genitourinary: Negative for dysuria.  Musculoskeletal: Positive for arthralgias. Negative for back pain and neck pain.  Skin: Negative for rash.  Neurological: Negative for syncope, weakness, light-headedness, numbness and headaches.     Physical Exam Updated Vital Signs BP 148/79   Pulse 81   Temp 98.3 F (36.8 C) (Oral)   Resp 16   Ht 5\' 6"  (1.676 m)   Wt 79.4 kg   SpO2 94%   BMI 28.25 kg/m   Physical Exam  Constitutional: She is oriented to person, place, and time. She appears well-developed and well-nourished. No distress.  Nontoxic-appearing.  HENT:  Head: Normocephalic and atraumatic.  Mouth/Throat: Oropharynx is clear and moist.  No visible or palpated signs of head trauma.  Eyes: Conjunctivae are normal. Pupils are equal, round, and reactive to light. Right eye exhibits no discharge. Left eye exhibits no discharge.  Neck: Neck supple. No JVD present.  No midline neck tenderness.  Cardiovascular: Normal rate, regular rhythm, normal heart sounds and intact distal pulses.   Bilateral dorsalis pedis and posterior tibialis pulses are intact. Good capillary refill to her bilateral distal toes.  Pulmonary/Chest: Effort normal and breath sounds normal. No stridor. No respiratory distress. She has no wheezes. She has no rales. She exhibits no tenderness.  Abdominal: Soft. There is no tenderness. There is no guarding.  Musculoskeletal: She exhibits tenderness. She exhibits no edema.  Tenderness to palpation to her right lateral and anterior hip. No obvious deformity. No ecchymosis or edema. No tenderness to her right knee, ankle or foot.  Lymphadenopathy:    She has no cervical  adenopathy.  Neurological: She is alert and oriented to person, place, and time. No sensory deficit. Coordination normal.  Sensation is intact her bilateral lower extremities.  Skin: Skin is warm and dry. Capillary refill takes less than 2 seconds. No rash noted. She is not diaphoretic. No erythema. No pallor.  Psychiatric: She has a normal mood and affect. Her behavior is normal.  Nursing note and vitals reviewed.    ED Treatments / Results  Labs (all labs ordered are listed, but only abnormal results are displayed) Labs Reviewed  BASIC METABOLIC PANEL - Abnormal; Notable for the following:       Result Value   CO2 21 (*)    Glucose, Bld 139 (*)    All other components within normal limits  CBC WITH DIFFERENTIAL/PLATELET - Abnormal; Notable for the following:    WBC 14.1 (*)    Neutro Abs 11.5 (*)    All other components within normal limits  PROTIME-INR  CBC  BASIC METABOLIC PANEL  FERRITIN  TYPE AND SCREEN  ABO/RH    EKG  EKG Interpretation None       Radiology Dg Chest 1 View  Result Date: 05/28/2016 CLINICAL DATA:  Status post slip and fall on ice with a hip injury today. EXAM: CHEST 1 VIEW COMPARISON:  PA and lateral chest 11/23/2010. FINDINGS: Lungs are clear. Heart size is normal. Prominent hiatal hernia is seen. No pneumothorax or pleural effusion. Aortic atherosclerosis is identified. No acute bony abnormality. IMPRESSION: No acute disease. Atherosclerosis. Hiatal hernia. Electronically Signed   By: Inge Rise M.D.   On: 05/28/2016 20:34   Dg Hip Unilat With Pelvis 2-3 Views Right  Result Date: 05/28/2016 CLINICAL DATA:  Pain after fall. EXAM: DG HIP (WITH OR WITHOUT PELVIS) 2-3V RIGHT COMPARISON:  None. FINDINGS: There is a lucency extending through the inferior trochanter into the femoral neck with increased sclerosis across the intertrochanteric region. The findings are consistent with a subtle nondisplaced intertrochanteric fracture. The left hip and  pelvic bones are otherwise normal. IMPRESSION: Subtle nondisplaced intertrochanteric fracture on the right. Electronically Signed   By: Dorise Bullion III M.D   On: 05/28/2016 20:33    Procedures Procedures (including critical care time)  Medications Ordered in ED Medications  carvedilol (COREG) tablet 6.25 mg (not administered)  levothyroxine (SYNTHROID, LEVOTHROID) tablet 88 mcg (not administered)  pantoprazole (PROTONIX) EC tablet 40 mg (not administered)  rosuvastatin (CRESTOR) tablet 10 mg (not administered)  docusate sodium (COLACE) capsule 100 mg (not administered)  polyethylene glycol (MIRALAX / GLYCOLAX) packet 17 g (not administered)  bisacodyl (DULCOLAX) suppository 10 mg (not administered)  HYDROmorphone (DILAUDID) injection 0.5 mg (0.5 mg Intravenous Given 05/29/16 0032)  oxyCODONE (Oxy IR/ROXICODONE) immediate release tablet 5 mg (not administered)  acetaminophen (TYLENOL) tablet 1,000 mg (1,000 mg Oral Given 05/29/16 0024)  0.9 %  sodium chloride infusion ( Intravenous New Bag/Given 05/29/16 0024)  HYDROmorphone (DILAUDID) injection 1 mg (1 mg Intravenous Given 05/28/16 1929)  ondansetron (ZOFRAN) injection 4 mg (4 mg Intravenous Given 05/28/16 1928)  HYDROmorphone (DILAUDID) injection 1 mg (1 mg Intravenous Given 05/28/16 2104)  methocarbamol (ROBAXIN) 1,000 mg in dextrose 5 % 50 mL IVPB (1,000 mg Intravenous Transfusing/Transfer 05/28/16 2326)     Initial Impression / Assessment and Plan / ED Course  I have reviewed the triage vital signs and the nursing notes.  Pertinent labs & imaging results that were available during my care of the patient were reviewed by me and considered in my medical decision making (see chart for details).     Patient presents to the emergency room after a slip and fall on ice and snow today complaining of right hip pain.  X-ray indicates right intertrochanteric hip fracture. Patient is neurovascularly intact. No other evidence of injury.  I  consulted with orthopedic surgeon Dr. Rolena Infante who like the patient admitted to medicine and will do surgery in the morning. Nothing by mouth after midnight.  Dr. Loleta Books accepted the patient for admission.  This patient was discussed with and evaluated by Dr. Gilford Raid who agrees with assessment and plan.   Final Clinical Impressions(s) / ED Diagnoses   Final diagnoses:  Closed fracture of right hip, initial encounter [facility])    New Prescriptions Current Discharge Medication List       Waynetta Pean, PA-C 05/29/16 0131    Isla Pence, MD 05/29/16 [phone]

## 2016-05-29 ENCOUNTER — Encounter (HOSPITAL_COMMUNITY): Payer: Self-pay | Admitting: General Practice

## 2016-05-29 ENCOUNTER — Inpatient Hospital Stay (HOSPITAL_COMMUNITY): Payer: Medicare Other | Admitting: Anesthesiology

## 2016-05-29 LAB — URINALYSIS, ROUTINE W REFLEX MICROSCOPIC
BILIRUBIN URINE: NEGATIVE
GLUCOSE, UA: NEGATIVE mg/dL
HGB URINE DIPSTICK: NEGATIVE
KETONES UR: NEGATIVE mg/dL
Leukocytes, UA: NEGATIVE
Nitrite: NEGATIVE
PROTEIN: NEGATIVE mg/dL
Specific Gravity, Urine: >1.03 — ABNORMAL HIGH (ref 1.005–1.030)
pH: 5.5 (ref 5.0–8.0)

## 2016-05-29 LAB — BASIC METABOLIC PANEL
ANION GAP: 8 (ref 5–15)
BUN: 17 mg/dL (ref 6–20)
CHLORIDE: 105 mmol/L (ref 101–111)
CO2: 24 mmol/L (ref 22–32)
Calcium: 8.4 mg/dL — ABNORMAL LOW (ref 8.9–10.3)
Creatinine, Ser: 0.96 mg/dL (ref 0.44–1.00)
GFR calc Af Amer: >60 mL/min (ref >60)
GFR, EST NON AFRICAN AMERICAN: 58 mL/min — AB (ref >60)
GLUCOSE: 151 mg/dL — AB (ref 65–99)
POTASSIUM: 3.6 mmol/L (ref 3.5–5.1)
SODIUM: 137 mmol/L (ref 135–145)

## 2016-05-29 LAB — CBC
HCT: 40.2 % (ref 36.0–46.0)
HEMOGLOBIN: 12.7 g/dL (ref 12.0–15.0)
MCH: 28.7 pg (ref 26.0–34.0)
MCHC: 31.6 g/dL (ref 30.0–36.0)
MCV: 91 fL (ref 78.0–100.0)
Platelets: 185 10*3/uL (ref 150–400)
RBC: 4.42 MIL/uL (ref 3.87–5.11)
RDW: 13.6 % (ref 11.5–15.5)
WBC: 11.9 10*3/uL — AB (ref 4.0–10.5)

## 2016-05-29 LAB — SURGICAL PCR SCREEN
MRSA, PCR: NEGATIVE
STAPHYLOCOCCUS AUREUS: POSITIVE — AB

## 2016-05-29 LAB — FERRITIN: FERRITIN: 45 ng/mL (ref 11–307)

## 2016-05-29 MED ORDER — MUPIROCIN 2 % EX OINT
TOPICAL_OINTMENT | Freq: Two times a day (BID) | CUTANEOUS | Status: AC
  Administered 2016-05-29 – 2016-05-31 (×3): via NASAL
  Administered 2016-05-31: 1 via NASAL
  Administered 2016-06-01: 18:00:00 via NASAL
  Administered 2016-06-01: 1 via NASAL
  Administered 2016-06-02 (×2): via NASAL
  Filled 2016-05-29 (×4): qty 22

## 2016-05-29 MED ORDER — HYDROMORPHONE HCL 2 MG/ML IJ SOLN
0.5000 mg | Freq: Once | INTRAMUSCULAR | Status: AC
  Administered 2016-05-29: 0.5 mg via INTRAVENOUS
  Filled 2016-05-29: qty 1

## 2016-05-29 MED ORDER — ENSURE ENLIVE PO LIQD
237.0000 mL | Freq: Every day | ORAL | Status: DC
  Administered 2016-05-29 – 2016-06-04 (×5): 237 mL via ORAL
  Filled 2016-05-29 (×3): qty 237

## 2016-05-29 MED ORDER — DIPHENHYDRAMINE HCL 25 MG PO CAPS
25.0000 mg | ORAL_CAPSULE | Freq: Four times a day (QID) | ORAL | Status: AC | PRN
  Administered 2016-05-29 – 2016-06-01 (×4): 25 mg via ORAL
  Filled 2016-05-29 (×4): qty 1

## 2016-05-29 NOTE — Consult Note (Signed)
Patient ID: Caitlyn [name] MRN: [id number] DOB/AGE: 06-19-1945 71 y.o.  Admit date: 05/28/2016  Admission Diagnoses:  Principal Problem:   Nondisplaced intertrochanteric fracture of right femur, initial encounter for closed fracture [facility]) Active Problems:   Iron deficiency anemia   Essential hypertension   Hypothyroidism, acquired   HPI: Pleasant 71 year old female pt with a past med hx of hypothyroidism, HTN and iron def anemia who presented to the hospital after a trama.  The pt reports falling outside her home in the ice.  She reports severe pain of her right hip after the trama.  She said she was unable to bear weight.  Pt still reports increased pain with any movement.   Past Medical History: Past Medical History:  Diagnosis Date  . Anemia   . Arthritis   . Bleeding diathesis ([place]) 02/22/2013  . Coronary artery disease   . E coli enteritis   . GERD (gastroesophageal reflux disease)   . H/O hiatal hernia   . Head injury   . Hypertension   . Iron deficiency anemia, unspecified 02/22/2013  . Mass of right kidney 08/22/2013   2.5 cm solid lesion seen on 5/14 CT  . Radicular pain of thoracic region 08/22/2013   Left T-8 started after pushing heavy chest 1/15; intermittent; no focal neuro deficit    Surgical History: Past Surgical History:  Procedure Laterality Date  . ABDOMINAL HYSTERECTOMY    . BREAST REDUCTION SURGERY  1981  . BREAST SURGERY  1981   rt-nodes taken out-non cancer  . BREAST SURGERY  1981   rt lumpectomy  . COLONOSCOPY    . FACIAL FRACTURE SURGERY  2000  . TONSILLECTOMY    . TRIGGER FINGER RELEASE Right 11/28/2013   Procedure: RELEASE TRIGGER FINGER/A-1 PULLEY RIGHT LONG FINGER;  Surgeon: Cammie Sickle, MD;  Location: [place];  Service: Orthopedics;  Laterality: Right;    Family History: Family History  Problem Relation Age of Onset  . Heart attack Father     Social History: Social History   Social History  . Marital  status: Widowed    Spouse name: N/A  . Number of children: N/A  . Years of education: N/A   Occupational History  . Not on file.   Social History Main Topics  . Smoking status: Never Smoker  . Smokeless tobacco: Never Used  . Alcohol use 0.0 oz/week     Comment: social  . Drug use: No  . Sexual activity: Not on file   Other Topics Concern  . Not on file   Social History Narrative  . No narrative on file    Allergies: Cortisone; Monosodium glutamate; Prednisone; Shellfish allergy; Readi-cat [barium sulfate]; Benzoic acid; Buprenorphine hcl; Demerol [meperidine]; Morphine and related; and Sulfa antibiotics  Medications: I have reviewed the patient's current medications.  Vital Signs: Patient Vitals for the past 24 hrs:  BP Temp Temp src Pulse Resp SpO2 Height Weight  05/29/16 0446 (!) 141/86 98.1 F (36.7 C) Oral 67 16 97 % - -  05/29/16 0321 (!) 155/72 98.2 F (36.8 C) Oral 70 16 98 % - -  05/28/16 2343 (!) 157/79 98.2 F (36.8 C) Oral 73 16 92 % - -  05/28/16 2300 148/79 - - 81 - 94 % - -  05/28/16 2230 (!) 118/49 - - 79 - 90 % - -  05/28/16 2200 140/71 - - 79 - 92 % - -  05/28/16 2130 155/80 - - 83 - Marland Kitchen)  86 % - -  05/28/16 2106 146/74 - - 83 - 94 % - -  05/28/16 1933 - - - - - - 5\' 6"  (1.676 m) 79.4 kg (175 lb)  05/28/16 1930 179/77 - - 80 - 99 % - -  05/28/16 1920 192/83 98.3 F (36.8 C) Oral 73 16 98 % - -    Radiology: Dg Chest 1 View  Result Date: 05/28/2016 CLINICAL DATA:  Status post slip and fall on ice with a hip injury today. EXAM: CHEST 1 VIEW COMPARISON:  PA and lateral chest 11/23/2010. FINDINGS: Lungs are clear. Heart size is normal. Prominent hiatal hernia is seen. No pneumothorax or pleural effusion. Aortic atherosclerosis is identified. No acute bony abnormality. IMPRESSION: No acute disease. Atherosclerosis. Hiatal hernia. Electronically Signed   By: Inge Rise M.D.   On: 05/28/2016 20:34   Dg Hip Unilat With Pelvis 2-3 Views  Right  Result Date: 05/28/2016 CLINICAL DATA:  Pain after fall. EXAM: DG HIP (WITH OR WITHOUT PELVIS) 2-3V RIGHT COMPARISON:  None. FINDINGS: There is a lucency extending through the inferior trochanter into the femoral neck with increased sclerosis across the intertrochanteric region. The findings are consistent with a subtle nondisplaced intertrochanteric fracture. The left hip and pelvic bones are otherwise normal. IMPRESSION: Subtle nondisplaced intertrochanteric fracture on the right. Electronically Signed   By: Dorise Bullion III M.D   On: 05/28/2016 20:33    Labs:  Recent Labs  05/28/16 1954 05/29/16 0426  WBC 14.1* 11.9*  RBC 4.73 4.42  HCT 41.9 40.2  PLT 203 185    Recent Labs  05/28/16 1954 05/29/16 0426  NA 138 137  K 3.6 3.6  CL 104 105  CO2 21* 24  BUN 14 17  CREATININE 0.78 0.96  GLUCOSE 139* 151*  CALCIUM 8.9 8.4*    Recent Labs  05/28/16 1954  INR 1.07    Review of Systems: ROS  Physical Exam: Neurologically intact ABD soft Sensation intact distally Dorsiflexion/Plantar flexion intact Compartment soft TTP of the right hip   Assessment and Plan: Pt may eat today Pt will be NPO after midnight Surgical intervention planned for tomorrow with dr. Dorthula Perfect, PAC for Melina Schools, MD [place] [phone]

## 2016-05-29 NOTE — Progress Notes (Signed)
Initial Nutrition Assessment  DOCUMENTATION CODES:    Not applicable  INTERVENTION:  Provide Ensure Enlive po once daily, each supplement provides 350 kcal and 20 grams of protein.  Encourage adequate PO intake.   NUTRITION DIAGNOSIS:   Increased nutrient needs related to  (surgery) as evidenced by estimated needs.  GOAL:   Patient will meet greater than or equal to 90% of their needs  MONITOR:   PO intake, Supplement acceptance, Labs, Weight trends, Skin, I & O's  REASON FOR ASSESSMENT:   Consult Hip fracture protocol  ASSESSMENT:   71 y.o. female with PMH of Asthma, hypothyroidism, HTN and iron def anemia who presents with hip pain after a fall. Mechanical fall. Non displaced intertrochanteric fracture on the right.   Plans for surgery tomorrow. Pt reports having a good appetite currently and PTA. Pt reports she has been eating well PTA with no other difficulties. No meal completion recorded, however pt reports 50-60% intake at breakfast this AM. Weight has been stable. RD to order nutritional supplements to aid in healing and adequate nutrition. Pt encouraged to eat her food at meals.   Limited Nutrition-Focused physical exam completed. Findings are no fat depletion, no muscle depletion, and no edema.   Labs and medications reviewed.   Diet Order:  Diet regular Room service appropriate? Yes; Fluid consistency: Thin  Skin:  Reviewed, no issues  Last BM:  Unknown  Height:   Ht Readings from Last 1 Encounters:  05/28/16 5\' 6"  (1.676 m)    Weight:   Wt Readings from Last 1 Encounters:  05/28/16 175 lb (79.4 kg)    Ideal Body Weight:  59 kg  BMI:  Body mass index is 28.25 kg/m.  Estimated Nutritional Needs:   Kcal:  1800-2000  Protein:  75-90 grams  Fluid:  1.8 - 2 L/day  EDUCATION NEEDS:   No education needs identified at this time  Corrin Parker, MS, RD, LDN Pager # [phone] After hours/ weekend pager # [phone]

## 2016-05-29 NOTE — Progress Notes (Addendum)
[facility] PROGRESS NOTE  Caitlyn [name] [id number] DOB: 03/21/1946 DOA: 05/28/2016 PCP: Henrine Screws, MD  Assessment/Plan: 71 y.o. female with PMH of Asthma, hypothyroidism, HTN and iron def anemia who presents with hip pain after a fall.  Mechanical fall. Non displaced intertrochanteric fracture on the right. Plan is surgery tomorrow per ortho  Hypertension. continue BB and statin. Hold ACEi pre-operatively Iron deficiency anemia. Hgb -WNL.  Ferritin 90s in July.  Is intolerant to oral iron, gets iron infusions with Dr. Beryle Beams. Hypothyroidism. Continue levothyroxine H/o asthma. No acute exacerbations. Exam is unremarkable  Leukocytosis. Afebrile. No respiratory symptoms. Check UA  Code Status: full Family Communication: d/w patient (indicate person spoken with, relationship, and if by phone, the number) Disposition Plan: pend surgery    Consultants:  ortho  Procedures:  Pend surgery   Antibiotics:  none (indicate start date, and stop date if known)  HPI/Subjective: Alert, comfortable   Objective: Vitals:   05/29/16 0321 05/29/16 0446  BP: (!) 155/72 (!) 141/86  Pulse: 70 67  Resp: 16 16  Temp: 98.2 F (36.8 C) 98.1 F (36.7 C)   No intake or output data in the 24 hours ending 05/29/16 1013 Filed Weights   05/28/16 1933  Weight: 79.4 kg (175 lb)    Exam:   General:  No distress   Cardiovascular: s1,s2 rrr  Respiratory: CTA BL   Abdomen: soft, nt, nd   Musculoskeletal: no pedal edema    Data Reviewed: Basic Metabolic Panel:  Recent Labs Lab 05/28/16 1954 05/29/16 0426  NA 138 137  K 3.6 3.6  CL 104 105  CO2 21* 24  GLUCOSE 139* 151*  BUN 14 17  CREATININE 0.78 0.96  CALCIUM 8.9 8.4*   Liver Function Tests: No results for input(s): AST, ALT, ALKPHOS, BILITOT, PROT, ALBUMIN in the last 168 hours. No results for input(s): LIPASE, AMYLASE in the last 168 hours. No results for input(s): AMMONIA in the last 168  hours. CBC:  Recent Labs Lab 05/28/16 1954 05/29/16 0426  WBC 14.1* 11.9*  NEUTROABS 11.5*  --   HGB 13.8 12.7  HCT 41.9 40.2  MCV 88.6 91.0  PLT 203 185   Cardiac Enzymes: No results for input(s): CKTOTAL, CKMB, CKMBINDEX, TROPONINI in the last 168 hours. BNP (last 3 results) No results for input(s): BNP in the last 8760 hours.  ProBNP (last 3 results) No results for input(s): PROBNP in the last 8760 hours.  CBG: No results for input(s): GLUCAP in the last 168 hours.  Recent Results (from the past 240 hour(s))  Surgical pcr screen     Status: Abnormal   Collection Time: 05/29/16  2:09 AM  Result Value Ref Range Status   MRSA, PCR NEGATIVE NEGATIVE Final   Staphylococcus aureus POSITIVE (A) NEGATIVE Final    Comment:        The Xpert SA Assay (FDA approved for NASAL specimens in patients over 45 years of age), is one component of a comprehensive surveillance program.  Test performance has been validated by [facility] for patients greater than or equal to 16 year old. It is not intended to diagnose infection nor to guide or monitor treatment.      Studies: Dg Chest 1 View  Result Date: 05/28/2016 CLINICAL DATA:  Status post slip and fall on ice with a hip injury today. EXAM: CHEST 1 VIEW COMPARISON:  PA and lateral chest 11/23/2010. FINDINGS: Lungs are clear. Heart size is normal. Prominent hiatal hernia is seen. No pneumothorax or  pleural effusion. Aortic atherosclerosis is identified. No acute bony abnormality. IMPRESSION: No acute disease. Atherosclerosis. Hiatal hernia. Electronically Signed   By: Inge Rise M.D.   On: 05/28/2016 20:34   Dg Hip Unilat With Pelvis 2-3 Views Right  Result Date: 05/28/2016 CLINICAL DATA:  Pain after fall. EXAM: DG HIP (WITH OR WITHOUT PELVIS) 2-3V RIGHT COMPARISON:  None. FINDINGS: There is a lucency extending through the inferior trochanter into the femoral neck with increased sclerosis across the intertrochanteric region.  The findings are consistent with a subtle nondisplaced intertrochanteric fracture. The left hip and pelvic bones are otherwise normal. IMPRESSION: Subtle nondisplaced intertrochanteric fracture on the right. Electronically Signed   By: Dorise Bullion III M.D   On: 05/28/2016 20:33    Scheduled Meds: . acetaminophen  1,000 mg Oral Q8H  . carvedilol  6.25 mg Oral BID WC  . docusate sodium  100 mg Oral BID  . levothyroxine  88 mcg Oral QAC breakfast  . pantoprazole  40 mg Oral Daily  . rosuvastatin  10 mg Oral QODAY   Continuous Infusions: . sodium chloride 125 mL/hr at 05/29/16 0720    Principal Problem:   Nondisplaced intertrochanteric fracture of right femur, initial encounter for closed fracture [facility]) Active Problems:   Iron deficiency anemia   Essential hypertension   Hypothyroidism, acquired    Time spent: >35 minutes     Kinnie Feil  [facility] Pager [phone]. If 7PM-7AM, please contact night-coverage at [email or web address], password [facility] 05/29/2016, 10:13 AM  LOS: 1 day

## 2016-05-30 ENCOUNTER — Inpatient Hospital Stay (HOSPITAL_COMMUNITY): Payer: Medicare Other

## 2016-05-30 ENCOUNTER — Encounter (HOSPITAL_COMMUNITY): Payer: Self-pay | Admitting: Radiology

## 2016-05-30 DIAGNOSIS — R079 Chest pain, unspecified: Secondary | ICD-10-CM

## 2016-05-30 DIAGNOSIS — S72143A Displaced intertrochanteric fracture of unspecified femur, initial encounter for closed fracture: Secondary | ICD-10-CM

## 2016-05-30 DIAGNOSIS — R0789 Other chest pain: Secondary | ICD-10-CM

## 2016-05-30 DIAGNOSIS — I248 Other forms of acute ischemic heart disease: Secondary | ICD-10-CM

## 2016-05-30 DIAGNOSIS — S72144A Nondisplaced intertrochanteric fracture of right femur, initial encounter for closed fracture: Principal | ICD-10-CM

## 2016-05-30 LAB — CBC
HEMATOCRIT: 39 % (ref 36.0–46.0)
HEMOGLOBIN: 12.1 g/dL (ref 12.0–15.0)
MCH: 28.8 pg (ref 26.0–34.0)
MCHC: 31 g/dL (ref 30.0–36.0)
MCV: 92.9 fL (ref 78.0–100.0)
Platelets: 148 10*3/uL — ABNORMAL LOW (ref 150–400)
RBC: 4.2 MIL/uL (ref 3.87–5.11)
RDW: 13.9 % (ref 11.5–15.5)
WBC: 9.2 10*3/uL (ref 4.0–10.5)

## 2016-05-30 LAB — TROPONIN I
TROPONIN I: 0.03 ng/mL — AB (ref <0.03)
Troponin I: <0.03 ng/mL (ref <0.03)
Troponin I: 0.08 ng/mL (ref <0.03)

## 2016-05-30 LAB — ECHOCARDIOGRAM COMPLETE
HEIGHTINCHES: 66 in
WEIGHTICAEL: 2800 [oz_av]

## 2016-05-30 MED ORDER — IOPAMIDOL (ISOVUE-370) INJECTION 76%
INTRAVENOUS | Status: AC
  Administered 2016-05-30: 100 mL
  Filled 2016-05-30: qty 100

## 2016-05-30 MED ORDER — VANCOMYCIN HCL 10 G IV SOLR
1500.0000 mg | Freq: Once | INTRAVENOUS | Status: AC
  Administered 2016-05-30: 1500 mg via INTRAVENOUS
  Filled 2016-05-30: qty 1500

## 2016-05-30 MED ORDER — HYDROMORPHONE HCL 2 MG/ML IJ SOLN
1.0000 mg | INTRAMUSCULAR | Status: DC | PRN
  Administered 2016-05-31 – 2016-06-01 (×3): 1 mg via INTRAVENOUS
  Filled 2016-05-30 (×3): qty 1

## 2016-05-30 MED ORDER — NALOXONE HCL 0.4 MG/ML IJ SOLN: 0.4000 mg | INTRAMUSCULAR | Status: DC | PRN

## 2016-05-30 MED ORDER — OXYCODONE HCL 5 MG PO TABS
10.0000 mg | ORAL_TABLET | ORAL | Status: DC | PRN
  Administered 2016-05-30: 10 mg via ORAL
  Filled 2016-05-30: qty 2

## 2016-05-30 MED ORDER — CHLORHEXIDINE GLUCONATE 4 % EX LIQD: 60.0000 mL | Freq: Once | CUTANEOUS | Status: DC

## 2016-05-30 MED ORDER — OXYCODONE HCL 5 MG PO TABS
5.0000 mg | ORAL_TABLET | ORAL | Status: DC | PRN
  Administered 2016-05-31 – 2016-06-02 (×7): 5 mg via ORAL
  Filled 2016-05-30 (×8): qty 1

## 2016-05-30 MED ORDER — NALOXONE HCL 0.4 MG/ML IJ SOLN
0.4000 mg | INTRAMUSCULAR | Status: DC | PRN
  Administered 2016-05-30: 0.4 mg via INTRAVENOUS

## 2016-05-30 MED ORDER — HYDRALAZINE HCL 20 MG/ML IJ SOLN: 5.0000 mg | Freq: Four times a day (QID) | INTRAMUSCULAR | Status: DC | PRN

## 2016-05-30 MED ORDER — VANCOMYCIN HCL IN DEXTROSE 750-5 MG/150ML-% IV SOLN
750.0000 mg | Freq: Two times a day (BID) | INTRAVENOUS | Status: DC
  Administered 2016-05-31 – 2016-06-01 (×2): 750 mg via INTRAVENOUS
  Filled 2016-05-30 (×3): qty 150

## 2016-05-30 MED ORDER — CEFAZOLIN SODIUM-DEXTROSE 2-4 GM/100ML-% IV SOLN: 2.0000 g | INTRAVENOUS | Status: DC

## 2016-05-30 MED ORDER — PERFLUTREN LIPID MICROSPHERE
1.0000 mL | INTRAVENOUS | Status: AC | PRN
  Administered 2016-05-30: 2 mL via INTRAVENOUS
  Filled 2016-05-30: qty 10

## 2016-05-30 MED ORDER — HYDROMORPHONE HCL 2 MG/ML IJ SOLN
1.0000 mg | INTRAMUSCULAR | Status: DC | PRN
  Administered 2016-05-30 (×3): 1 mg via INTRAVENOUS
  Filled 2016-05-30 (×3): qty 1

## 2016-05-30 MED ORDER — NALOXONE HCL 0.4 MG/ML IJ SOLN
INTRAMUSCULAR | Status: AC
  Administered 2016-05-30: 0.4 mg
  Filled 2016-05-30: qty 1

## 2016-05-30 MED ORDER — PANTOPRAZOLE SODIUM 40 MG PO TBEC
40.0000 mg | DELAYED_RELEASE_TABLET | Freq: Two times a day (BID) | ORAL | Status: DC
  Administered 2016-05-30 – 2016-06-07 (×16): 40 mg via ORAL
  Filled 2016-05-30 (×17): qty 1

## 2016-05-30 MED ORDER — LEVOFLOXACIN IN D5W 750 MG/150ML IV SOLN
750.0000 mg | INTRAVENOUS | Status: DC
  Administered 2016-05-30 – 2016-06-02 (×4): 750 mg via INTRAVENOUS
  Filled 2016-05-30 (×6): qty 150

## 2016-05-30 MED ORDER — KETOROLAC TROMETHAMINE 15 MG/ML IJ SOLN
15.0000 mg | Freq: Once | INTRAMUSCULAR | Status: AC
  Administered 2016-05-30: 15 mg via INTRAVENOUS
  Filled 2016-05-30: qty 1

## 2016-05-30 MED ORDER — POVIDONE-IODINE 10 % EX SWAB: 2.0000 "application " | Freq: Once | CUTANEOUS | Status: DC

## 2016-05-30 MED ORDER — CEFEPIME HCL 1 G IJ SOLR
1.0000 g | Freq: Three times a day (TID) | INTRAMUSCULAR | Status: AC
  Administered 2016-05-30 – 2016-06-01 (×5): 1 g via INTRAVENOUS
  Filled 2016-05-30 (×5): qty 1

## 2016-05-30 NOTE — Progress Notes (Signed)
Pharmacy Antibiotic Note Caitlyn [name] is a 71 y.o. female admitted on 05/28/2016 with multifocal pneumonia.  Pharmacy has been consulted for Cefepime, Levaquin and vancomycin dosing.  Plan: 1. Vancomycin 750 IV every 12 hours.  Goal trough 15-20 mcg/mL.  2. Cefepime 1 gram IV every 8 hours  3. Levaquin 750 mg IV every 24 hours  4. In the absence of concern for atypical organism(s) would consider stopping Levaquin as adds little to the breath of coverage provided by Cefepime    Height: 5\' 6"  (167.6 cm) Weight: 175 lb (79.4 kg) IBW/kg (Calculated) : 59.3  Temp (24hrs), Avg:98.5 F (36.9 C), Min:98 F (36.7 C), Max:98.9 F (37.2 C)   Recent Labs Lab 05/28/16 1954 05/29/16 0426 05/30/16 0406  WBC 14.1* 11.9* 9.2  CREATININE 0.78 0.96  --     Estimated Creatinine Clearance: 56.3 mL/min (by C-G formula based on SCr of 0.96 mg/dL).    Allergies  Allergen Reactions  . Cortisone Anaphylaxis    Some forms of cortisone  . Monosodium Glutamate Anaphylaxis  . Prednisone Anaphylaxis    confusion  . Shellfish Allergy Anaphylaxis  . Readi-Cat [Barium Sulfate] Diarrhea    Patient does not want to drink Readi-Cat because it gives her diarrhea  . Benzoic Acid Nausea Only  . Buprenorphine Hcl Hypertension    Vomiting, shaking, & elevates BP  . Demerol [Meperidine]   . Morphine And Related Other (See Comments)    Vomiting, shaking, & elevates BP  . Sulfa Antibiotics    Antimicrobials this admission:  1/20 Cefepime >>  1/20 Levaquin  >>  1/20 vancomycin >>   Microbiology results:  1/20 MRSA PCR: negative    Thank you for allowing pharmacy to be a part of this patient's care.  Vincenza Hews, PharmD, BCPS 05/30/2016, 8:37 PM

## 2016-05-30 NOTE — Progress Notes (Signed)
Patient had CP this am. Initial trop 0.03. Spoke with anesthesia who recommends delaying surgery to cycle troponins. Will cancel surgery today and plan for tomorrow. NPO after MN tonight. Discussed with patient and family at bedside.

## 2016-05-30 NOTE — Progress Notes (Signed)
Just spoke with Caitlyn [name], D on call for [place]. Informed of lastest troponin result. States will review chart in reference to lab results. Also states okay for pt to receive scheduled tylenol and based on patients presentation okay to receive narcotics later in shift if patient is in severe pain. Caitlyn [name] verbalizes request to space out medications if given / needed.

## 2016-05-30 NOTE — Progress Notes (Addendum)
Patient ID: Caitlyn [name], female   DOB: 1945/08/28, 71 y.o.   MRN: [id number]                                                                PROGRESS NOTE                                                                                                                                                                                                             Patient Demographics:    Caitlyn [name], is a 71 y.o. female, DOB - 1946/04/26, [id number]  Admit date - 05/28/2016   Admitting Physician Edwin Dada, MD  Outpatient Primary MD for the patient is Henrine Screws, MD  LOS - 2  Outpatient Specialists  Qu[facility] Burow  Chief Complaint  Patient presents with  . Hip Pain       Brief Narrative 71 y.o. female with a past medical history significant for hypothyroidism, HTN and iron def anemia who presents with hip pain after a fall.  The patient was in her normal state of health until this evening when she was walking her dog, slipped on the ice, fell on her right hip and had immediate RIGHT hip pain and could not walk.    ED course: -Radiograph the right hip showed a nondisplaced right intertrochanteric fracture     Subjective:    Caitlyn [name] today has sharp chest pain lasting a few sec substernal without radiation about 1 hour ago. Pt states that had some gerd associated with this and nausea and vomitting,  No blood.  Pt can't recall when her last stress test was.  Pt denies fever, chills, cough, palp, sob, abdominal pain, diarrhea, brbpr.   EKG: nsr at 80, nl axis, early R progression, awaiting trop    Assessment  & Plan :    Principal Problem:   Nondisplaced intertrochanteric fracture of right femur, initial encounter for closed fracture (HCC) Active Problems:   Iron deficiency anemia   Essential hypertension   Hypothyroidism, acquired   Chest pain ekg reviewed Trop I q6h x3 CXR pending Cardiac echo ordered Cardiology consulted , spoke with fellow,  appreciate their input.   Non displaced intertrochanteric fracture on the right. Plan is surgery today but due to CP OR will not take her   Hypertension. continue  BB and statin. Holding ACEi pre-operatively  Iron deficiency anemia. Hgb -WNL. Ferritin 90s in July. Is intolerant to oral iron, gets iron infusions with Dr. Beryle Beams.  Hypothyroidism. Continue levothyroxine  H/o asthma. No acute exacerbations. Exam is unremarkable   Leukocytosis. Resolved    Code Status : FULL CODE  Family Communication  : w patient  Disposition Plan  :  Per PT recommendations  Barriers For Discharge :   Consults  :  Orthopedic, cardiology  Procedures  :   DVT Prophylaxis  :  - SCDs   Lab Results  Component Value Date   PLT 148 (L) 05/30/2016    Antibiotics  :    Anti-infectives    Start     Dose/Rate Route Frequency Ordered Stop   05/30/16 0600  ceFAZolin (ANCEF) IVPB 2g/100 mL premix     2 g 200 mL/hr over 30 Minutes Intravenous To Short Stay 05/30/16 0334 05/31/16 0600        Objective:   Vitals:   05/29/16 1049 05/29/16 1300 05/29/16 2011 05/30/16 0423  BP: (!) 102/57 (!) 143/63 135/68 (!) 147/83  Pulse: 72 75 82 77  Resp:  17  18  Temp:  98.6 F (37 C) 99 F (37.2 C) 98.9 F (37.2 C)  TempSrc:  Oral Oral Oral  SpO2: 98% 99% 93% 94%  Weight:      Height:        Wt Readings from Last 3 Encounters:  05/28/16 79.4 kg (175 lb)  10/29/15 77.3 kg (170 lb 6.4 oz)  08/26/15 79.3 kg (174 lb 14.4 oz)     Intake/Output Summary (Last 24 hours) at 05/30/16 0541 Last data filed at 05/30/16 0445  Gross per 24 hour  Intake          2178.33 ml  Output              840 ml  Net          1338.33 ml     Physical Exam  Awake Alert, Oriented X 3, No new F.N deficits, Normal affect [place].AT,PERRAL Supple Neck,No JVD, No cervical lymphadenopathy appriciated.  Symmetrical Chest wall movement, Good air movement bilaterally, CTAB, slight pain w palpation to chest wall RRR,No  Gallops,Rubs or new Murmurs, No Parasternal Heave +ve B.Sounds, Abd Soft, No tenderness, No organomegaly appriciated, No rebound - guarding or rigidity. No Cyanosis, Clubbing or edema, No new Rash or bruise      Data Review:    CBC  Recent Labs Lab 05/28/16 1954 05/29/16 0426 05/30/16 0406  WBC 14.1* 11.9* 9.2  HGB 13.8 12.7 12.1  HCT 41.9 40.2 39.0  PLT 203 185 148*  MCV 88.6 91.0 92.9  MCH 29.2 28.7 28.8  MCHC 32.9 31.6 31.0  RDW 13.4 13.6 13.9  LYMPHSABS 1.7  --   --   MONOABS 0.7  --   --   EOSABS 0.1  --   --   BASOSABS 0.0  --   --     Chemistries   Recent Labs Lab 05/28/16 1954 05/29/16 0426  NA 138 137  K 3.6 3.6  CL 104 105  CO2 21* 24  GLUCOSE 139* 151*  BUN 14 17  CREATININE 0.78 0.96  CALCIUM 8.9 8.4*   ------------------------------------------------------------------------------------------------------------------ No results for input(s): CHOL, HDL, LDLCALC, TRIG, CHOLHDL, LDLDIRECT in the last 72 hours.  No results found for: HGBA1C ------------------------------------------------------------------------------------------------------------------ No results for input(s): TSH, T4TOTAL, T3FREE, THYROIDAB in the last 72 hours.  Invalid input(s): FREET3 ------------------------------------------------------------------------------------------------------------------  Recent Labs  05/29/16 0426  FERRITIN 45    Coagulation profile  Recent Labs Lab 05/28/16 1954  INR 1.07    No results for input(s): DDIMER in the last 72 hours.  Cardiac Enzymes No results for input(s): CKMB, TROPONINI, MYOGLOBIN in the last 168 hours.  Invalid input(s): CK ------------------------------------------------------------------------------------------------------------------ No results found for: BNP  Inpatient Medications  Scheduled Meds: . acetaminophen  1,000 mg Oral Q8H  . carvedilol  6.25 mg Oral BID WC  .  ceFAZolin (ANCEF) IV  2 g Intravenous  To SSTC  . chlorhexidine  60 mL Topical Once  . docusate sodium  100 mg Oral BID  . feeding supplement (ENSURE ENLIVE)  237 mL Oral Q1500  . levothyroxine  88 mcg Oral QAC breakfast  . mupirocin ointment   Nasal BID  . pantoprazole  40 mg Oral Daily  . povidone-iodine  2 application Topical Once  . rosuvastatin  10 mg Oral QODAY   Continuous Infusions: . sodium chloride 125 mL/hr at 05/29/16 1900   PRN Meds:.bisacodyl, diphenhydrAMINE, HYDROmorphone (DILAUDID) injection, oxyCODONE, polyethylene glycol  Micro Results Recent Results (from the past 240 hour(s))  Surgical pcr screen     Status: Abnormal   Collection Time: 05/29/16  2:09 AM  Result Value Ref Range Status   MRSA, PCR NEGATIVE NEGATIVE Final   Staphylococcus aureus POSITIVE (A) NEGATIVE Final    Comment:        The Xpert SA Assay (FDA approved for NASAL specimens in patients over 55 years of age), is one component of a comprehensive surveillance program.  Test performance has been validated by [facility] for patients greater than or equal to 63 year old. It is not intended to diagnose infection nor to guide or monitor treatment.     Radiology Reports Dg Chest 1 View  Result Date: 05/28/2016 CLINICAL DATA:  Status post slip and fall on ice with a hip injury today. EXAM: CHEST 1 VIEW COMPARISON:  PA and lateral chest 11/23/2010. FINDINGS: Lungs are clear. Heart size is normal. Prominent hiatal hernia is seen. No pneumothorax or pleural effusion. Aortic atherosclerosis is identified. No acute bony abnormality. IMPRESSION: No acute disease. Atherosclerosis. Hiatal hernia. Electronically Signed   By: Inge Rise M.D.   On: 05/28/2016 20:34   Dg Hip Unilat With Pelvis 2-3 Views Right  Result Date: 05/28/2016 CLINICAL DATA:  Pain after fall. EXAM: DG HIP (WITH OR WITHOUT PELVIS) 2-3V RIGHT COMPARISON:  None. FINDINGS: There is a lucency extending through the inferior trochanter into the femoral neck with increased  sclerosis across the intertrochanteric region. The findings are consistent with a subtle nondisplaced intertrochanteric fracture. The left hip and pelvic bones are otherwise normal. IMPRESSION: Subtle nondisplaced intertrochanteric fracture on the right. Electronically Signed   By: Dorise Bullion III M.D   On: 05/28/2016 20:33    Time Spent in minutes  30   Jani Gravel M.D on 05/30/2016 at 5:41 AM  Between 7am to 7pm - Pager - [phone]  After 7pm go to [email or web address] - password [facility]  [facility] -  Office  [phone]

## 2016-05-30 NOTE — Progress Notes (Signed)
  Echocardiogram 2D Echocardiogram has been performed.  Darlina Sicilian M 05/30/2016, 10:25 AM

## 2016-05-30 NOTE — Progress Notes (Signed)
Patient had complaints of mid sternum chest pain/ pressure 8/10 and a episode of vomiting. EKG was taken and MD notified. Vitals stable on 2L of O2. Pt chest pain has resolved. Tropinins pending. Will continue to assess pt.

## 2016-05-30 NOTE — Progress Notes (Addendum)
CRITICAL VALUE ALERT  Critical value received: troponin 0.03  Date of notification: 05/30/2016  Time of notification:  0730  Critical value read back:Yes.    Nurse who received alert:  Alvester Chou   MD notified (1st page): Jani Gravel MD  Time of first page:  [phone]  MD notified (2nd page):  Time of second page:  Responding MD:  Jani Gravel MD  Time MD responded:  [phone]

## 2016-05-30 NOTE — Progress Notes (Signed)
CRITICAL VALUE ALERT  Critical value received:  Trop 0.08  Date of notification:  1/20  Time of notification: 1932  Critical value read back: yes  Nurse who received alert:  Maudie Mercury, RN  MD notified (1st page):  [place], [place].  Time of first page:  1949, paged by Orma Render, RN

## 2016-05-30 NOTE — Progress Notes (Signed)
       CRITICAL VALUE ALERT  Critical value received:  Trop 0.08  Date of notification:  1/20  Time of notification: 1930  Critical value read back: yes  Nurse who received alert:  Maudie Mercury, RN

## 2016-05-30 NOTE — Consult Note (Signed)
Cardiology Consult    Patient ID: Caitlyn [name] MRN: [id number], DOB/AGE: [birth year]   Admit date: 05/28/2016 Date of Consult: 05/30/2016  Primary Physician: Henrine Screws, MD Primary Cardiologist: Dr. [name] Requesting Provider: Dr. Maudie Mercury Reason for Consultation: Chest pain  Patient Profile    71 yo female with PMH of IDA, HTN, HLD and family Hx of CAD who presented after a fall outside on ice with a right hip fracture.   Past Medical History   Past Medical History:  Diagnosis Date  . Anemia   . Arthritis   . Bleeding diathesis ([place]) 02/22/2013  . Closed right hip fracture ([place]) 05/28/2016  . Coronary artery disease   . E coli enteritis   . GERD (gastroesophageal reflux disease)   . H/O hiatal hernia   . Head injury   . Hypertension   . Iron deficiency anemia, unspecified 02/22/2013  . Mass of right kidney 08/22/2013   2.5 cm solid lesion seen on 5/14 CT  . Radicular pain of thoracic region 08/22/2013   Left T-8 started after pushing heavy chest 1/15; intermittent; no focal neuro deficit    Past Surgical History:  Procedure Laterality Date  . ABDOMINAL HYSTERECTOMY    . BREAST REDUCTION SURGERY  1981  . BREAST SURGERY  1981   rt-nodes taken out-non cancer  . BREAST SURGERY  1981   rt lumpectomy  . COLONOSCOPY    . FACIAL FRACTURE SURGERY  2000  . TONSILLECTOMY    . TRIGGER FINGER RELEASE Right 11/28/2013   Procedure: RELEASE TRIGGER FINGER/A-1 PULLEY RIGHT LONG FINGER;  Surgeon: Cammie Sickle, MD;  Location: [facility];  Service: Orthopedics;  Laterality: Right;     Allergies  Allergies  Allergen Reactions  . Cortisone Anaphylaxis    Some forms of cortisone  . Monosodium Glutamate Anaphylaxis  . Prednisone Anaphylaxis    confusion  . Shellfish Allergy Anaphylaxis  . Readi-Cat [Barium Sulfate] Diarrhea    Patient does not want to drink Readi-Cat because it gives her diarrhea  . Benzoic Acid Nausea Only  . Buprenorphine Hcl  Hypertension    Vomiting, shaking, & elevates BP  . Demerol [Meperidine]   . Morphine And Related Other (See Comments)    Vomiting, shaking, & elevates BP  . Sulfa Antibiotics     History of Present Illness    Caitlyn [name] is a 71 yo female with PMH of IDA, HTN, HLD and family Hx of CAD. Reports having a remote Hx of AF back when she lived in [place], [place] but no reoccurrence since that time. Also reports having a stress test around that same time that was normal. Per Dr. Kennon Holter note she had a cardiac cath in 2002 that showed normal coronaries and normal LV function. She was last seen in the office on 6/17 after she went to Dr. Donnella Bi office for a spinal injection and reported atypical chest pain the night prior.    Reports she was walking her dog on 05/28/16 when she slipped on the ice and fell on her right hip. Had right hip pain after afterwards and presented to the ED. Xray showed right hip fracture. She reports being very active at home and walks her dog multiple times a day. Denies any exertional dyspnea or chest pain. Scheduled to have hip surgery today. Developed centralized chest pain around 530 this morning. She reports this felt like her GERD that she experiences intermittently, and states also had a burning sensation in her chest. No  dyspnea, no radiation of pressure, but did have one episode of vomiting. EKG showed SR with no ST/T wave changes. Trop 0.03 x1. Denies any further episodes.   Inpatient Medications    . acetaminophen  1,000 mg Oral Q8H  . carvedilol  6.25 mg Oral BID WC  .  ceFAZolin (ANCEF) IV  2 g Intravenous To SSTC  . chlorhexidine  60 mL Topical Once  . docusate sodium  100 mg Oral BID  . feeding supplement (ENSURE ENLIVE)  237 mL Oral Q1500  . levothyroxine  88 mcg Oral QAC breakfast  . mupirocin ointment   Nasal BID  . pantoprazole  40 mg Oral Daily  . povidone-iodine  2 application Topical Once  . rosuvastatin  10 mg Oral QODAY    Family History      Family History  Problem Relation Age of Onset  . Heart attack Father     Social History    Social History   Social History  . Marital status: Widowed    Spouse name: N/A  . Number of children: N/A  . Years of education: N/A   Occupational History  . Not on file.   Social History Main Topics  . Smoking status: Never Smoker  . Smokeless tobacco: Never Used  . Alcohol use 0.0 oz/week     Comment: social  . Drug use: No  . Sexual activity: Not on file   Other Topics Concern  . Not on file   Social History Narrative  . No narrative on file     Review of Systems    General:  No chills, fever, night sweats or weight changes.  Cardiovascular:  See HPI Dermatological: No rash, lesions/masses Respiratory: No cough, dyspnea Urologic: No hematuria, dysuria Abdominal:   No nausea, vomiting, diarrhea, bright red blood per rectum, melena, or hematemesis Neurologic:  No visual changes, wkns, changes in mental status. All other systems reviewed and are otherwise negative except as noted above.  Physical Exam    Blood pressure (!) 147/83, pulse 77, temperature 98.9 F (37.2 C), temperature source Oral, resp. rate 18, height 5\' 6"  (1.676 m), weight 175 lb (79.4 kg), SpO2 94 %.  General: Pleasant older WF, NAD Psych: Normal affect. Neuro: Alert and oriented X 3. Moves all extremities spontaneously. HEENT: Normal  Neck: Supple without bruits or JVD. Lungs:  Resp regular and unlabored, CTA. Heart: RRR no s3, s4, or murmurs. Abdomen: Soft, non-tender, non-distended, BS + x 4.  Extremities: No clubbing, cyanosis or edema. DP/PT/Radials 2+ and equal bilaterally.  Labs    Troponin (Point of Care Test) No results for input(s): TROPIPOC in the last 72 hours.  Recent Labs  05/30/16 0604  TROPONINI 0.03*   Lab Results  Component Value Date   WBC 9.2 05/30/2016   HGB 12.1 05/30/2016   HCT 39.0 05/30/2016   MCV 92.9 05/30/2016   PLT 148 (L) 05/30/2016    Recent Labs Lab  05/29/16 0426  NA 137  K 3.6  CL 105  CO2 24  BUN 17  CREATININE 0.96  CALCIUM 8.4*  GLUCOSE 151*   No results found for: CHOL, HDL, LDLCALC, TRIG No results found for: [facility]   Radiology Studies    Dg Chest 1 View  Result Date: 05/28/2016 CLINICAL DATA:  Status post slip and fall on ice with a hip injury today. EXAM: CHEST 1 VIEW COMPARISON:  PA and lateral chest 11/23/2010. FINDINGS: Lungs are clear. Heart size is normal. Prominent hiatal hernia is  seen. No pneumothorax or pleural effusion. Aortic atherosclerosis is identified. No acute bony abnormality. IMPRESSION: No acute disease. Atherosclerosis. Hiatal hernia. Electronically Signed   By: Inge Rise M.D.   On: 05/28/2016 20:34   Dg Hip Unilat With Pelvis 2-3 Views Right  Result Date: 05/28/2016 CLINICAL DATA:  Pain after fall. EXAM: DG HIP (WITH OR WITHOUT PELVIS) 2-3V RIGHT COMPARISON:  None. FINDINGS: There is a lucency extending through the inferior trochanter into the femoral neck with increased sclerosis across the intertrochanteric region. The findings are consistent with a subtle nondisplaced intertrochanteric fracture. The left hip and pelvic bones are otherwise normal. IMPRESSION: Subtle nondisplaced intertrochanteric fracture on the right. Electronically Signed   By: Dorise Bullion III M.D   On: 05/28/2016 20:33    ECG & Cardiac Imaging    EKG: SR  Echo: None  Assessment & Plan    71 yo female with PMH of IDA, HTN, HLD and family Hx of CAD who presented after a fall outside on ice with a right hip fracture.   1. Chest pain: Reports this was brief in nature, and had an episode of vomiting. Has had increased stress while being here in the hospital waiting on her right hip surgery. Reports this episode felt like her normal GERD symptoms and not anginal in nature. EKG showed SR, Trop 0.03x1. No further episodes. Had a normal cath in 2002. -- Given her reported symptoms seems consistent with GERD and nonacute EKG  seems reasonable to proceed with planned surgery today without further cardiac work up. Will discuss this with attending.    2. Right hip fracture: Planned for surgery today.    Barnet Pall, NP-C Pager [phone] 05/30/2016, 8:26 AM   Personally seen and examined. Agree with above.  Pleasant 71 year old female who unfortunately fell while walking dog and had right hip fracture. Troponin is 0.03 right be on the upper limit of normal. Surgery has been canceled.   - Mildly elevated troponin 0.03.  - We will await echocardiogram  - I have spoken to echo sonographer to complete.  - It is likely that her minimally elevated troponin is related to her underlying hip fracture/stress. This is not likely acute coronary syndrome, however this does increase her overall cardiovascular risk.  - If Echocardiogram is reassuring, proceed with surgery.   - Family aware of situation. Seems reasonable to proceed with caution.  - She is currently chest pain-free, resting topically, heart is regular rate and rhythm, no murmurs lungs are clear.  Candee Furbish, MD

## 2016-05-30 NOTE — Progress Notes (Signed)
Called to pt room from charge nurse pt was de-sating to the 80s-82's RR called thinks pt should be narcan last dose of medication given at 1445 1 mg of dilaudid, pt give narcan .4 at 1705, dr called, wants stat cta with contrast done and echocardiogram, pt calling out in pain, as she was prior to increase of medication order this am, stating her chest and back are hurting. Pt is very anxious and yelling out, going in and out of orientation.

## 2016-05-30 NOTE — Significant Event (Signed)
Rapid Response Event Note  Overview:  Called by Rn for patient lethargic with decreasing sats.   Time Called: [id number] Arrival Time: 1650 Event Type: Neurologic, Respiratory  Initial Focused Assessment:  Called by Rn for patient with decreasing sats and lethargic.  On my arrival to patiens room, RN and family at bedside.  Patient is on NRB sats 97%, patient is lethargic arouses to painful stimuli or loud voice but drifts back to sleep quickly.  As per RN has been receiving pain medicine due to uncontrolled pain.     Interventions:  Breath sounds coarse, belly soft tender to palpation on RLQ.  Skin warm and dry.  216/85, 112, RR 10, 97% on NRB.  Oxygen decreased to [place] 5LPm sats 90-91%.  Switched to 50% venti mask. Narcan 0.4mg  given, with good response, now awake and alert.  She is coughing up greenish sputum.  Patient is now c/o burning central chest pain and back pain.  BP is improved on own-122/72 , HR 113, RR 18, 94% on 50%.  Spoke with Dr. Maudie Mercury.  Orders received.  Needs PIV in upper arm for CTA, waiting for IV team  Plan of Care (if not transferred):   Orders to be followed through and MD updated.  IV team placed 20g in Right forearm.  Patient transported to CT scan with Rn and back to room  Event Summary:  RN to call if assistance needed     at      at          [facility], [place]

## 2016-05-30 NOTE — Progress Notes (Signed)
Spoke with Dr Maudie Mercury regarding trop 0.03 and Dr Lyla Glassing states Ms Reome is not having surgery today, requested an increase in pain medication and a diet

## 2016-05-31 DIAGNOSIS — I214 Non-ST elevation (NSTEMI) myocardial infarction: Secondary | ICD-10-CM

## 2016-05-31 LAB — CBC
HEMATOCRIT: 35 % — AB (ref 36.0–46.0)
Hemoglobin: 11.2 g/dL — ABNORMAL LOW (ref 12.0–15.0)
MCH: 28.9 pg (ref 26.0–34.0)
MCHC: 32 g/dL (ref 30.0–36.0)
MCV: 90.4 fL (ref 78.0–100.0)
PLATELETS: 129 10*3/uL — AB (ref 150–400)
RBC: 3.87 MIL/uL (ref 3.87–5.11)
RDW: 13.6 % (ref 11.5–15.5)
WBC: 8.9 10*3/uL (ref 4.0–10.5)

## 2016-05-31 LAB — COMPREHENSIVE METABOLIC PANEL
ALT: 10 U/L — ABNORMAL LOW (ref 14–54)
ANION GAP: 6 (ref 5–15)
AST: 17 U/L (ref 15–41)
Albumin: 2.5 g/dL — ABNORMAL LOW (ref 3.5–5.0)
Alkaline Phosphatase: 57 U/L (ref 38–126)
BILIRUBIN TOTAL: 1 mg/dL (ref 0.3–1.2)
BUN: 10 mg/dL (ref 6–20)
CHLORIDE: 106 mmol/L (ref 101–111)
CO2: 26 mmol/L (ref 22–32)
Calcium: 7.9 mg/dL — ABNORMAL LOW (ref 8.9–10.3)
Creatinine, Ser: 0.61 mg/dL (ref 0.44–1.00)
GFR calc Af Amer: >60 mL/min (ref >60)
Glucose, Bld: 102 mg/dL — ABNORMAL HIGH (ref 65–99)
POTASSIUM: 3.1 mmol/L — AB (ref 3.5–5.1)
Sodium: 138 mmol/L (ref 135–145)
TOTAL PROTEIN: 5.2 g/dL — AB (ref 6.5–8.1)

## 2016-05-31 LAB — HEPARIN LEVEL (UNFRACTIONATED): Heparin Unfractionated: 0.16 IU/mL — ABNORMAL LOW (ref 0.30–0.70)

## 2016-05-31 LAB — TROPONIN I
Troponin I: 0.18 ng/mL (ref <0.03)
Troponin I: 0.2 ng/mL (ref <0.03)

## 2016-05-31 MED ORDER — FENTANYL CITRATE (PF) 100 MCG/2ML IJ SOLN
INTRAMUSCULAR | Status: AC
  Filled 2016-05-31: qty 2

## 2016-05-31 MED ORDER — CARVEDILOL 12.5 MG PO TABS
12.5000 mg | ORAL_TABLET | Freq: Two times a day (BID) | ORAL | Status: DC
  Administered 2016-05-31 – 2016-06-07 (×13): 12.5 mg via ORAL
  Filled 2016-05-31 (×14): qty 1

## 2016-05-31 MED ORDER — SUCCINYLCHOLINE CHLORIDE 200 MG/10ML IV SOSY
PREFILLED_SYRINGE | INTRAVENOUS | Status: AC
  Filled 2016-05-31: qty 10

## 2016-05-31 MED ORDER — PANCRELIPASE (LIP-PROT-AMYL) 36000-114000 UNITS PO CPEP
36000.0000 [IU] | ORAL_CAPSULE | Freq: Every day | ORAL | Status: DC
  Administered 2016-05-31 – 2016-06-07 (×7): 36000 [IU] via ORAL
  Filled 2016-05-31 (×9): qty 1

## 2016-05-31 MED ORDER — ASPIRIN 81 MG PO CHEW
81.0000 mg | CHEWABLE_TABLET | Freq: Every day | ORAL | Status: DC
  Administered 2016-05-31 – 2016-06-03 (×3): 81 mg via ORAL
  Filled 2016-05-31 (×4): qty 1

## 2016-05-31 MED ORDER — HEPARIN BOLUS VIA INFUSION
4000.0000 [IU] | Freq: Once | INTRAVENOUS | Status: AC
  Administered 2016-05-31: 4000 [IU] via INTRAVENOUS
  Filled 2016-05-31: qty 4000

## 2016-05-31 MED ORDER — PROPOFOL 10 MG/ML IV BOLUS
INTRAVENOUS | Status: AC
  Filled 2016-05-31: qty 20

## 2016-05-31 MED ORDER — POTASSIUM CHLORIDE CRYS ER 20 MEQ PO TBCR
40.0000 meq | EXTENDED_RELEASE_TABLET | Freq: Once | ORAL | Status: AC
  Administered 2016-05-31: 40 meq via ORAL
  Filled 2016-05-31: qty 2

## 2016-05-31 MED ORDER — LIDOCAINE 2% (20 MG/ML) 5 ML SYRINGE
INTRAMUSCULAR | Status: AC
  Filled 2016-05-31: qty 5

## 2016-05-31 MED ORDER — LACTATED RINGERS IV SOLN
INTRAVENOUS | Status: DC | PRN
  Administered 2016-05-31: 07:00:00 via INTRAVENOUS
  Administered 2016-06-03: 50 mL/h

## 2016-05-31 MED ORDER — HYDRALAZINE HCL 20 MG/ML IJ SOLN
10.0000 mg | Freq: Four times a day (QID) | INTRAMUSCULAR | Status: DC | PRN
  Administered 2016-06-01 – 2016-06-02 (×3): 10 mg via INTRAVENOUS
  Filled 2016-05-31 (×4): qty 1

## 2016-05-31 MED ORDER — HEPARIN (PORCINE) IN NACL 100-0.45 UNIT/ML-% IJ SOLN
1100.0000 [IU]/h | INTRAMUSCULAR | Status: DC
  Administered 2016-05-31: 900 [IU]/h via INTRAVENOUS
  Filled 2016-05-31 (×2): qty 250

## 2016-05-31 MED ORDER — FENTANYL CITRATE (PF) 100 MCG/2ML IJ SOLN
INTRAMUSCULAR | Status: AC
Start: 2016-05-31 — End: 2016-05-31
  Filled 2016-05-31: qty 2

## 2016-05-31 MED ORDER — CEFAZOLIN SODIUM-DEXTROSE 2-4 GM/100ML-% IV SOLN
INTRAVENOUS | Status: AC
  Filled 2016-05-31: qty 100

## 2016-05-31 MED ORDER — ROCURONIUM BROMIDE 50 MG/5ML IV SOSY
PREFILLED_SYRINGE | INTRAVENOUS | Status: AC
  Filled 2016-05-31: qty 5

## 2016-05-31 MED ORDER — ONDANSETRON HCL 4 MG/2ML IJ SOLN
INTRAMUSCULAR | Status: AC
  Filled 2016-05-31: qty 2

## 2016-05-31 MED ORDER — FENTANYL CITRATE (PF) 100 MCG/2ML IJ SOLN
INTRAMUSCULAR | Status: DC | PRN
  Administered 2016-05-31 (×2): 50 ug via INTRAVENOUS

## 2016-05-31 NOTE — Progress Notes (Signed)
ANTICOAGULATION CONSULT NOTE - Initial Consult  Pharmacy Consult for Heparin Indication: chest pain/ACS  Allergies  Allergen Reactions  . Cortisone Anaphylaxis    Some forms of cortisone  . Monosodium Glutamate Anaphylaxis  . Prednisone Anaphylaxis    confusion  . Shellfish Allergy Anaphylaxis  . Readi-Cat [Barium Sulfate] Diarrhea    Patient does not want to drink Readi-Cat because it gives her diarrhea  . Benzoic Acid Nausea Only  . Buprenorphine Hcl Hypertension    Vomiting, shaking, & elevates BP  . Demerol [Meperidine]   . Morphine And Related Other (See Comments)    Vomiting, shaking, & elevates BP  . Sulfa Antibiotics     Patient Measurements: Height: 5\' 6"  (167.6 cm) Weight: 175 lb (79.4 kg) IBW/kg (Calculated) : 59.3 Heparin Dosing Weight: 75.7 kg  Vital Signs: Temp: 98.6 F (37 C) (01/21 0537) Temp Source: Oral (01/21 0537) BP: 151/64 (01/21 0537) Pulse Rate: 79 (01/21 0537)  Labs:  Recent Labs  05/28/16 1954 05/29/16 0426 05/30/16 0406  05/30/16 1815 05/31/16 0018 05/31/16 0556  HGB 13.8 12.7 12.1  --   --   --  11.2*  HCT 41.9 40.2 39.0  --   --   --  35.0*  PLT 203 185 148*  --   --   --  129*  LABPROT 13.9  --   --   --   --   --   --   INR 1.07  --   --   --   --   --   --   CREATININE 0.78 0.96  --   --   --   --  0.61  TROPONINI  --   --   --   < > 0.08* 0.18* 0.20*  < > = values in this interval not displayed.  Estimated Creatinine Clearance: 67.5 mL/min (by C-G formula based on SCr of 0.61 mg/dL).   Medical History: Past Medical History:  Diagnosis Date  . Anemia   . Arthritis   . Bleeding diathesis ([place]) 02/22/2013  . Closed right hip fracture ([place]) 05/28/2016  . Coronary artery disease   . E coli enteritis   . GERD (gastroesophageal reflux disease)   . H/O hiatal hernia   . Head injury   . Hypertension   . Iron deficiency anemia, unspecified 02/22/2013  . Mass of right kidney 08/22/2013   2.5 cm solid lesion seen on 5/14 CT   . Radicular pain of thoracic region 08/22/2013   Left T-8 started after pushing heavy chest 1/15; intermittent; no focal neuro deficit    Assessment: 71 year old female who fell on the ice with right hip fracture in need of repair now with chest pain and rising troponin to start IV heparin.   Patient with history of iron deficiency anemia - H/H has steadily trended down since admission. Platelets also trending down at 129 (203 on admission).  Could be dilutional as entire CBC down. No bleeding noted.  SCr improved at 0.61.   Goal of Therapy:  Heparin level 0.3-0.7 units/ml Monitor platelets by anticoagulation protocol: Yes   Plan:  Give 4000 units bolus x 1 Start heparin infusion at 900 units/hr Check anti-Xa level in 8 hours and daily while on heparin Continue to monitor H&H and platelets  Sloan Leiter, PharmD, BCPS Clinical Pharmacist Clinical Phone 05/31/2016 until 3:30 PM [phone] After hours, please call #[phone] 05/31/2016,11:18 AM

## 2016-05-31 NOTE — Progress Notes (Signed)
Patient ID: [name], female   DOB: November 15, 1945, 71 y.o.   MRN: [id number]                                                                PROGRESS NOTE                                                                                                                                                                                                             Patient Demographics:    Caitlyn [name], is a 71 y.o. female, DOB - 18-May-1945, [id number]  Admit date - 05/28/2016   Admitting Physician Edwin Dada, MD  Outpatient Primary MD for the patient is Henrine Screws, MD  LOS - 3  Outpatient Specialists:   Chief Complaint  Patient presents with  . Hip Pain       Brief Narrative  71 y.o.femalewith a past medical history significant for hypothyroidism, HTN and iron def anemiawho presents with hip pain after a fall.  The patient was in hernormal state of health until this evening when she was walking her dog, slipped on the ice, fell on her right hipand had immediate RIGHThip pain and could not walk.   ED course: -Radiograph the right hip showed a nondisplaced right intertrochanteric fracture    Subjective:    Caitlyn [name] today denies c/o of chest pain.  Pt had slight hypoxia yesterday. CTA chest negative for PE.  Showed pneumonia.   Trop slightly elevated, likely demand.  Pt not having any chest pain presently.    Pt alert and feeling better.  Slight dry cough.  Pt denies fever, chills,  Cp, palp, sob, n/v, diarrhea.    Assessment  & Plan :    Principal Problem:   Nondisplaced intertrochanteric fracture of right femur, initial encounter for closed fracture ([place]) Active Problems:   Iron deficiency anemia   Essential hypertension   Hypothyroidism, acquired   Chest pain   Chest pain resolved, + trop peak 0.2 Echo 1/20=> EF 60% Cardiac cath tomorrow NPO after midnite Heparin iv, cont bb, statin, aspirin Appreciate cardiology input  Non displaced  intertrochanteric fracture on the right. no surgery until further investigation of troponin elevation w cath  Pneumonia Hcap Cont vanco, cefepime, levaquin Will d/c cefepime in am if stable  Hypertension. continue BB  and statin. Holding ACEi pre-operatively  Iron deficiency anemia. Hgb -WNL. Ferritin 90s in July. Is intolerant to oral iron, gets iron infusions with Dr. Beryle Beams.  Hypothyroidism. Continue levothyroxine  H/o asthma. No acute exacerbations. Exam is unremarkable   Pancreatic insufficiency Pt states on creon at home, ordered this   Code Status : FULL CODE  Family Communication  : w patient  Disposition Plan  :  Per PT recommendations  Barriers For Discharge :   Consults  :  Orthopedic, cardiology  Procedures  :   DVT Prophylaxis  :  - SCDs    Lab Results  Component Value Date   PLT 129 (L) 05/31/2016    Antibiotics  :    Anti-infectives    Start     Dose/Rate Route Frequency Ordered Stop   05/31/16 0900  vancomycin (VANCOCIN) IVPB 750 mg/150 ml premix     750 mg 150 mL/hr over 60 Minutes Intravenous Every 12 hours 05/30/16 1955     05/31/16 0723  ceFAZolin (ANCEF) 2-4 GM/100ML-% IVPB    Comments:  Caitlyn [name]   : cabinet override      05/31/16 0723 05/31/16 1929   05/30/16 2000  ceFEPIme (MAXIPIME) 1 g in dextrose 5 % 50 mL IVPB     1 g 100 mL/hr over 30 Minutes Intravenous Every 8 hours 05/30/16 1952     05/30/16 2000  vancomycin (VANCOCIN) 1,500 mg in sodium chloride 0.9 % 500 mL IVPB     1,500 mg 250 mL/hr over 120 Minutes Intravenous  Once 05/30/16 1952 05/31/16 0101   05/30/16 2000  levofloxacin (LEVAQUIN) IVPB 750 mg     750 mg 100 mL/hr over 90 Minutes Intravenous Every 24 hours 05/30/16 1953     05/30/16 0600  ceFAZolin (ANCEF) IVPB 2g/100 mL premix  Status:  Discontinued     2 g 200 mL/hr over 30 Minutes Intravenous To Short Stay 05/30/16 0334 05/30/16 1952        Objective:   Vitals:   05/31/16 0358  05/31/16 0537 05/31/16 0555 05/31/16 1413  BP:  (!) 151/64  (!) 178/69  Pulse:  79  79  Resp:  16  19  Temp:  98.6 F (37 C)  99 F (37.2 C)  TempSrc:  Oral  Oral  SpO2: 94% 98% 98% 97%  Weight:      Height:        Wt Readings from Last 3 Encounters:  05/28/16 79.4 kg (175 lb)  10/29/15 77.3 kg (170 lb 6.4 oz)  08/26/15 79.3 kg (174 lb 14.4 oz)     Intake/Output Summary (Last 24 hours) at 05/31/16 1434 Last data filed at 05/31/16 0853  Gross per 24 hour  Intake             1480 ml  Output              800 ml  Net              680 ml     Physical Exam  Awake Alert, Oriented X 3, No new F.N deficits, Normal affect [place].AT,PERRAL Supple Neck,No JVD, No cervical lymphadenopathy appriciated.  Symmetrical Chest wall movement, Good air movement bilaterally, slight crackle left lung base  RRR,No Gallops,Rubs or new Murmurs, No Parasternal Heave +ve B.Sounds, Abd Soft, No tenderness, No organomegaly appriciated, No rebound - guarding or rigidity. No Cyanosis, Clubbing or edema, No new Rash or bruise       Data Review:  CBC  Recent Labs Lab 05/28/16 1954 05/29/16 0426 05/30/16 0406 05/31/16 0556  WBC 14.1* 11.9* 9.2 8.9  HGB 13.8 12.7 12.1 11.2*  HCT 41.9 40.2 39.0 35.0*  PLT 203 185 148* 129*  MCV 88.6 91.0 92.9 90.4  MCH 29.2 28.7 28.8 28.9  MCHC 32.9 31.6 31.0 32.0  RDW 13.4 13.6 13.9 13.6  LYMPHSABS 1.7  --   --   --   MONOABS 0.7  --   --   --   EOSABS 0.1  --   --   --   BASOSABS 0.0  --   --   --     Chemistries   Recent Labs Lab 05/28/16 1954 05/29/16 0426 05/31/16 0556  NA 138 137 138  K 3.6 3.6 3.1*  CL 104 105 106  CO2 21* 24 26  GLUCOSE 139* 151* 102*  BUN 14 17 10   CREATININE 0.78 0.96 0.61  CALCIUM 8.9 8.4* 7.9*  AST  --   --  17  ALT  --   --  10*  ALKPHOS  --   --  57  BILITOT  --   --  1.0   ------------------------------------------------------------------------------------------------------------------ No results for  input(s): CHOL, HDL, LDLCALC, TRIG, CHOLHDL, LDLDIRECT in the last 72 hours.  No results found for: HGBA1C ------------------------------------------------------------------------------------------------------------------ No results for input(s): TSH, T4TOTAL, T3FREE, THYROIDAB in the last 72 hours.  Invalid input(s): FREET3 ------------------------------------------------------------------------------------------------------------------  Recent Labs  05/29/16 0426  FERRITIN 45    Coagulation profile  Recent Labs Lab 05/28/16 1954  INR 1.07    No results for input(s): DDIMER in the last 72 hours.  Cardiac Enzymes  Recent Labs Lab 05/30/16 1815 05/31/16 0018 05/31/16 0556  TROPONINI 0.08* 0.18* 0.20*   ------------------------------------------------------------------------------------------------------------------ No results found for: BNP  Inpatient Medications  Scheduled Meds: . acetaminophen  1,000 mg Oral Q8H  . aspirin  81 mg Oral Daily  . carvedilol  6.25 mg Oral BID WC  . ceFAZolin      . ceFEPime (MAXIPIME) IV  1 g Intravenous Q8H  . chlorhexidine  60 mL Topical Once  . docusate sodium  100 mg Oral BID  . feeding supplement (ENSURE ENLIVE)  237 mL Oral Q1500  . levofloxacin (LEVAQUIN) IV  750 mg Intravenous Q24H  . levothyroxine  88 mcg Oral QAC breakfast  . lipase/protease/amylase  36,000 Units Oral Daily  . mupirocin ointment   Nasal BID  . pantoprazole  40 mg Oral BID  . povidone-iodine  2 application Topical Once  . rosuvastatin  10 mg Oral QODAY  . vancomycin  750 mg Intravenous Q12H   Continuous Infusions: . sodium chloride 50 mL/hr at 05/30/16 1718  . heparin 900 Units/hr (05/31/16 1407)   PRN Meds:.bisacodyl, diphenhydrAMINE, hydrALAZINE, HYDROmorphone (DILAUDID) injection, naLOXone (NARCAN)  injection, naLOXone (NARCAN)  injection, oxyCODONE, polyethylene glycol  Micro Results Recent Results (from the past 240 hour(s))  Surgical pcr  screen     Status: Abnormal   Collection Time: 05/29/16  2:09 AM  Result Value Ref Range Status   MRSA, PCR NEGATIVE NEGATIVE Final   Staphylococcus aureus POSITIVE (A) NEGATIVE Final    Comment:        The Xpert SA Assay (FDA approved for NASAL specimens in patients over 88 years of age), is one component of a comprehensive surveillance program.  Test performance has been validated by [facility] for patients greater than or equal to 53 year old. It is not intended to diagnose infection nor to  guide or monitor treatment.     Radiology Reports Dg Chest 1 View  Result Date: 05/28/2016 CLINICAL DATA:  Status post slip and fall on ice with a hip injury today. EXAM: CHEST 1 VIEW COMPARISON:  PA and lateral chest 11/23/2010. FINDINGS: Lungs are clear. Heart size is normal. Prominent hiatal hernia is seen. No pneumothorax or pleural effusion. Aortic atherosclerosis is identified. No acute bony abnormality. IMPRESSION: No acute disease. Atherosclerosis. Hiatal hernia. Electronically Signed   By: Inge Rise M.D.   On: 05/28/2016 20:34   Ct Angio Chest Pe W Or Wo Contrast  Result Date: 05/30/2016 CLINICAL DATA:  Chest pain. History of hypertension, hiatal hernia, RIGHT kidney mass. EXAM: CT ANGIOGRAPHY CHEST WITH CONTRAST TECHNIQUE: Multidetector CT imaging of the chest was performed using the standard protocol during bolus administration of intravenous contrast. Multiplanar CT image reconstructions and MIPs were obtained to evaluate the vascular anatomy. CONTRAST:  100 cc Isovue 370 COMPARISON:  CT abdomen and pelvis November 27, 2015 and chest radiograph June 07, 2016 at 0704 hours FINDINGS: CARDIOVASCULAR: Adequate contrast opacification of the pulmonary artery's. Main pulmonary artery is not enlarged. No pulmonary arterial filling defects to the level of the subsegmental branches. Heart size is normal, no right heart strain. No pericardial effusions. Thoracic aorta is normal course and  caliber, trace calcific atherosclerosis. MEDIASTINUM/NODES: No lymphadenopathy by CT size criteria. LUNGS/PLEURA: Tracheobronchial tree is patent, no pneumothorax. Hypoenhancing consolidation LEFT lower lobe with a component of atelectasis due to large hiatal hernia. Patchy ground-glass nodules bilateral lungs. Similar calcified possible hamartoma, measures 11 mm RIGHT middle lobe. UPPER ABDOMEN: Large hiatal hernia. Multifocal scarring RIGHT kidney. MUSCULOSKELETAL: Visualized soft tissues and included osseous structures appear normal. Review of the MIP images confirms the above findings. IMPRESSION: No acute pulmonary embolism. Scattered ground-glass opacities with LEFT lung consolidation most consistent with multifocal pneumonia. Large hiatal hernia with a component LEFT lower lobe atelectasis. Electronically Signed   By: Elon Alas M.D.   On: 05/30/2016 19:43   Dg Chest Port 1 View  Result Date: 05/30/2016 CLINICAL DATA:  Chest pain EXAM: PORTABLE CHEST 1 VIEW COMPARISON:  May 28, 2016 FINDINGS: No pneumothorax. Persistent hiatal hernia. Small layering left effusion and associated atelectasis not excluded. No other interval changes. IMPRESSION: Persistent large hiatal hernia. Small layering left effusion and associated atelectasis not excluded. Electronically Signed   By: Dorise Bullion III M.D   On: 05/30/2016 08:49   Dg Hip Unilat With Pelvis 2-3 Views Right  Result Date: 05/28/2016 CLINICAL DATA:  Pain after fall. EXAM: DG HIP (WITH OR WITHOUT PELVIS) 2-3V RIGHT COMPARISON:  None. FINDINGS: There is a lucency extending through the inferior trochanter into the femoral neck with increased sclerosis across the intertrochanteric region. The findings are consistent with a subtle nondisplaced intertrochanteric fracture. The left hip and pelvic bones are otherwise normal. IMPRESSION: Subtle nondisplaced intertrochanteric fracture on the right. Electronically Signed   By: Dorise Bullion III M.D    On: 05/28/2016 20:33    Time Spent in minutes  30   Jani Gravel M.D on 05/31/2016 at 2:34 PM  Between 7am to 7pm - Pager - [phone]  After 7pm go to [email or web address] - password [facility]  [facility] -  Office  [phone]

## 2016-05-31 NOTE — Progress Notes (Signed)
Caitlyn [name] was seen by Dr. Lyla Glassing for right intertroch hip fracture.  She developed chest pain the other day and surgery was postponed.  She is undergoing workup at this time.  She is currently off the floor and not in her room at this time on morning rounds.   Dr. Lyla Glassing to follow up with patient.  Awaiting medical/cardiac clearance prior to surgery. Arlee Muslim, PA-C

## 2016-05-31 NOTE — H&P (View-Only) (Signed)
Patient ID: Caitlyn [name] MRN: [id number] DOB/AGE: 1945-08-09 71 y.o.  Admit date: 05/28/2016  Admission Diagnoses:  Principal Problem:   Nondisplaced intertrochanteric fracture of right femur, initial encounter for closed fracture [facility]) Active Problems:   Iron deficiency anemia   Essential hypertension   Hypothyroidism, acquired   HPI: Pleasant 71 year old female pt with a past med hx of hypothyroidism, HTN and iron def anemia who presented to the hospital after a trama.  The pt reports falling outside her home in the ice.  She reports severe pain of her right hip after the trama.  She said she was unable to bear weight.  Pt still reports increased pain with any movement.   Past Medical History: Past Medical History:  Diagnosis Date  . Anemia   . Arthritis   . Bleeding diathesis ([place]) 02/22/2013  . Coronary artery disease   . E coli enteritis   . GERD (gastroesophageal reflux disease)   . H/O hiatal hernia   . Head injury   . Hypertension   . Iron deficiency anemia, unspecified 02/22/2013  . Mass of right kidney 08/22/2013   2.5 cm solid lesion seen on 5/14 CT  . Radicular pain of thoracic region 08/22/2013   Left T-8 started after pushing heavy chest 1/15; intermittent; no focal neuro deficit    Surgical History: Past Surgical History:  Procedure Laterality Date  . ABDOMINAL HYSTERECTOMY    . BREAST REDUCTION SURGERY  1981  . BREAST SURGERY  1981   rt-nodes taken out-non cancer  . BREAST SURGERY  1981   rt lumpectomy  . COLONOSCOPY    . FACIAL FRACTURE SURGERY  2000  . TONSILLECTOMY    . TRIGGER FINGER RELEASE Right 11/28/2013   Procedure: RELEASE TRIGGER FINGER/A-1 PULLEY RIGHT LONG FINGER;  Surgeon: Cammie Sickle, MD;  Location: [facility];  Service: Orthopedics;  Laterality: Right;    Family History: Family History  Problem Relation Age of Onset  . Heart attack Father     Social History: Social History   Social History  . Marital  status: Widowed    Spouse name: N/A  . Number of children: N/A  . Years of education: N/A   Occupational History  . Not on file.   Social History Main Topics  . Smoking status: Never Smoker  . Smokeless tobacco: Never Used  . Alcohol use 0.0 oz/week     Comment: social  . Drug use: No  . Sexual activity: Not on file   Other Topics Concern  . Not on file   Social History Narrative  . No narrative on file    Allergies: Cortisone; Monosodium glutamate; Prednisone; Shellfish allergy; Readi-cat [barium sulfate]; Benzoic acid; Buprenorphine hcl; Demerol [meperidine]; Morphine and related; and Sulfa antibiotics  Medications: I have reviewed the patient's current medications.  Vital Signs: Patient Vitals for the past 24 hrs:  BP Temp Temp src Pulse Resp SpO2 Height Weight  05/29/16 0446 (!) 141/86 98.1 F (36.7 C) Oral 67 16 97 % - -  05/29/16 0321 (!) 155/72 98.2 F (36.8 C) Oral 70 16 98 % - -  05/28/16 2343 (!) 157/79 98.2 F (36.8 C) Oral 73 16 92 % - -  05/28/16 2300 148/79 - - 81 - 94 % - -  05/28/16 2230 (!) 118/49 - - 79 - 90 % - -  05/28/16 2200 140/71 - - 79 - 92 % - -  05/28/16 2130 155/80 - - 83 - Marland Kitchen)  86 % - -  05/28/16 2106 146/74 - - 83 - 94 % - -  05/28/16 1933 - - - - - - 5\' 6"  (1.676 m) 79.4 kg (175 lb)  05/28/16 1930 179/77 - - 80 - 99 % - -  05/28/16 1920 192/83 98.3 F (36.8 C) Oral 73 16 98 % - -    Radiology: Dg Chest 1 View  Result Date: 05/28/2016 CLINICAL DATA:  Status post slip and fall on ice with a hip injury today. EXAM: CHEST 1 VIEW COMPARISON:  PA and lateral chest 11/23/2010. FINDINGS: Lungs are clear. Heart size is normal. Prominent hiatal hernia is seen. No pneumothorax or pleural effusion. Aortic atherosclerosis is identified. No acute bony abnormality. IMPRESSION: No acute disease. Atherosclerosis. Hiatal hernia. Electronically Signed   By: Inge Rise M.D.   On: 05/28/2016 20:34   Dg Hip Unilat With Pelvis 2-3 Views  Right  Result Date: 05/28/2016 CLINICAL DATA:  Pain after fall. EXAM: DG HIP (WITH OR WITHOUT PELVIS) 2-3V RIGHT COMPARISON:  None. FINDINGS: There is a lucency extending through the inferior trochanter into the femoral neck with increased sclerosis across the intertrochanteric region. The findings are consistent with a subtle nondisplaced intertrochanteric fracture. The left hip and pelvic bones are otherwise normal. IMPRESSION: Subtle nondisplaced intertrochanteric fracture on the right. Electronically Signed   By: Dorise Bullion III M.D   On: 05/28/2016 20:33    Labs:  Recent Labs  05/28/16 1954 05/29/16 0426  WBC 14.1* 11.9*  RBC 4.73 4.42  HCT 41.9 40.2  PLT 203 185    Recent Labs  05/28/16 1954 05/29/16 0426  NA 138 137  K 3.6 3.6  CL 104 105  CO2 21* 24  BUN 14 17  CREATININE 0.78 0.96  GLUCOSE 139* 151*  CALCIUM 8.9 8.4*    Recent Labs  05/28/16 1954  INR 1.07    Review of Systems: ROS  Physical Exam: Neurologically intact ABD soft Sensation intact distally Dorsiflexion/Plantar flexion intact Compartment soft TTP of the right hip   Assessment and Plan: Pt may eat today Pt will be NPO after midnight Surgical intervention planned for tomorrow with dr. Dorthula Perfect, PAC for Melina Schools, MD [place] [phone]

## 2016-05-31 NOTE — Progress Notes (Signed)
Pt wanted to eat/ drink. At time 2020 took off mask and placed patient on nasal cannula to see if sats can tolerate. Pt sats 93-95 on 3L [place] since.

## 2016-05-31 NOTE — Progress Notes (Signed)
Progress Note  Patient Name: Caitlyn [name] Date of Encounter: 05/31/2016  Primary Cardiologist: Dr. [name]  Subjective   No further CP. Surgery cancelled again because of rapid response episode last evening, decreased responsiveness, given narcan, then complained of CP to back again. Troponin drawn and mildly increased.   Inpatient Medications    Scheduled Meds: . acetaminophen  1,000 mg Oral Q8H  . carvedilol  6.25 mg Oral BID WC  . ceFAZolin      . ceFEPime (MAXIPIME) IV  1 g Intravenous Q8H  . chlorhexidine  60 mL Topical Once  . docusate sodium  100 mg Oral BID  . feeding supplement (ENSURE ENLIVE)  237 mL Oral Q1500  . levofloxacin (LEVAQUIN) IV  750 mg Intravenous Q24H  . levothyroxine  88 mcg Oral QAC breakfast  . mupirocin ointment   Nasal BID  . pantoprazole  40 mg Oral BID  . povidone-iodine  2 application Topical Once  . rosuvastatin  10 mg Oral QODAY  . vancomycin  750 mg Intravenous Q12H   Continuous Infusions: . sodium chloride 50 mL/hr at 05/30/16 1718   PRN Meds: bisacodyl, diphenhydrAMINE, hydrALAZINE, HYDROmorphone (DILAUDID) injection, naLOXone (NARCAN)  injection, naLOXone (NARCAN)  injection, oxyCODONE, polyethylene glycol   Vital Signs    Vitals:   05/31/16 0320 05/31/16 0358 05/31/16 0537 05/31/16 0555  BP:   (!) 151/64   Pulse:   79   Resp:   16   Temp:   98.6 F (37 C)   TempSrc:   Oral   SpO2: 98% 94% 98% 98%  Weight:      Height:        Intake/Output Summary (Last 24 hours) at 05/31/16 1113 Last data filed at 05/31/16 0853  Gross per 24 hour  Intake             1630 ml  Output              800 ml  Net              830 ml   Filed Weights   05/28/16 1933  Weight: 175 lb (79.4 kg)    Telemetry    starting - Personally Reviewed  ECG    No st changes - Personally Reviewed  Physical Exam   GEN: No acute distress.  Neck: No JVD Cardiac: RRR, no murmurs, rubs, or gallops.  Respiratory: Clear to auscultation  bilaterally. GI: Soft, nontender, non-distended  MS: No edema; Right leg deformity. Neuro:  AAOx3. Psych: Normal affect  Labs    Chemistry Recent Labs Lab 05/28/16 1954 05/29/16 0426 05/31/16 0556  NA 138 137 138  K 3.6 3.6 3.1*  CL 104 105 106  CO2 21* 24 26  GLUCOSE 139* 151* 102*  BUN 14 17 10   CREATININE 0.78 0.96 0.61  CALCIUM 8.9 8.4* 7.9*  PROT  --   --  5.2*  ALBUMIN  --   --  2.5*  AST  --   --  17  ALT  --   --  10*  ALKPHOS  --   --  57  BILITOT  --   --  1.0  GFRNONAA >60 58* >60  GFRAA >60 >60 >60  ANIONGAP 13 8 6      Hematology Recent Labs Lab 05/29/16 0426 05/30/16 0406 05/31/16 0556  WBC 11.9* 9.2 8.9  RBC 4.42 4.20 3.87  HGB 12.7 12.1 11.2*  HCT 40.2 39.0 35.0*  MCV 91.0 92.9 90.4  MCH 28.7  28.8 28.9  MCHC 31.6 31.0 32.0  RDW 13.6 13.9 13.6  PLT 185 148* 129*    Cardiac Enzymes Recent Labs Lab 05/30/16 1141 05/30/16 1815 05/31/16 0018 05/31/16 0556  TROPONINI <0.03 0.08* 0.18* 0.20*   No results for input(s): TROPIPOC in the last 168 hours.   BNPNo results for input(s): BNP, PROBNP in the last 168 hours.   DDimer No results for input(s): DDIMER in the last 168 hours.   Radiology    Ct Angio Chest Pe W Or Wo Contrast  Result Date: 05/30/2016 CLINICAL DATA:  Chest pain. History of hypertension, hiatal hernia, RIGHT kidney mass. EXAM: CT ANGIOGRAPHY CHEST WITH CONTRAST TECHNIQUE: Multidetector CT imaging of the chest was performed using the standard protocol during bolus administration of intravenous contrast. Multiplanar CT image reconstructions and MIPs were obtained to evaluate the vascular anatomy. CONTRAST:  100 cc Isovue 370 COMPARISON:  CT abdomen and pelvis November 27, 2015 and chest radiograph June 07, 2016 at 0704 hours FINDINGS: CARDIOVASCULAR: Adequate contrast opacification of the pulmonary artery's. Main pulmonary artery is not enlarged. No pulmonary arterial filling defects to the level of the subsegmental branches.  Heart size is normal, no right heart strain. No pericardial effusions. Thoracic aorta is normal course and caliber, trace calcific atherosclerosis. MEDIASTINUM/NODES: No lymphadenopathy by CT size criteria. LUNGS/PLEURA: Tracheobronchial tree is patent, no pneumothorax. Hypoenhancing consolidation LEFT lower lobe with a component of atelectasis due to large hiatal hernia. Patchy ground-glass nodules bilateral lungs. Similar calcified possible hamartoma, measures 11 mm RIGHT middle lobe. UPPER ABDOMEN: Large hiatal hernia. Multifocal scarring RIGHT kidney. MUSCULOSKELETAL: Visualized soft tissues and included osseous structures appear normal. Review of the MIP images confirms the above findings. IMPRESSION: No acute pulmonary embolism. Scattered ground-glass opacities with LEFT lung consolidation most consistent with multifocal pneumonia. Large hiatal hernia with a component LEFT lower lobe atelectasis. Electronically Signed   By: Elon Alas M.D.   On: 05/30/2016 19:43   Dg Chest Port 1 View  Result Date: 05/30/2016 CLINICAL DATA:  Chest pain EXAM: PORTABLE CHEST 1 VIEW COMPARISON:  May 28, 2016 FINDINGS: No pneumothorax. Persistent hiatal hernia. Small layering left effusion and associated atelectasis not excluded. No other interval changes. IMPRESSION: Persistent large hiatal hernia. Small layering left effusion and associated atelectasis not excluded. Electronically Signed   By: Dorise Bullion III M.D   On: 05/30/2016 08:49    Cardiac Studies   ECHO - normal EF  Patient Profile     71 y.o. female with right hip fracture while walking dog with elevated troponin and chest pain, with no PE, large left hiatal hernia and surgery postponed due to increased troponin.   Assessment & Plan    NSTEMI  - possibly type 2 (demand ischemia) in the setting of her right hip fracture but it has increased from 0.08 to 0.2. She had another episode of chest pain on the evening of 1/20 in the setting of  rapid response (she was somnolent, narcan given).  - EF reassuring on ECHO  - Since she had another episode of discomfort, we will proceed with diagnostic left heart cath tomorrow. Risks and benefits explained including stroke MI death. Family member in room.   - Will start heparin IV for the next 24 hours.   - ASA 81  - Crestor 10  - Coreg 6.25 BID  - Tele.  - I'm OK with her staying in her current room since she is not having any active CP.   Explained that diagnostic cath  will help [place] determine further risk for surgery. Will need to further discuss if lesion is found.   Right hip fx  - obviously she needs her surgical correction of this. This is a must.   GERD, hiatal hernia  - may be culprit for chest pain??  - however troponin mildly elevated (however this may be secondary to underlying stress of hip fx, demand isch??)  - cath.   PNA  - noted on CT  - ABX  - NO PE.    Signed, Candee Furbish, MD  05/31/2016, 11:13 AM

## 2016-05-31 NOTE — Progress Notes (Signed)
Patient had chest pain and desat yesterday PM. Chest CT showed multifocal PNA. Repeat Trop trending up - last was 0.2. Spoke with Dr. Haroldine Laws with cards who rec cancelling surgery for further cardiac workup. Will need hip fx surgery when medically able.

## 2016-05-31 NOTE — Interval H&P Note (Deleted)
History and Physical Interval Note:  05/31/2016 7:32 AM  Caitlyn [name]  has presented today for surgery, with the diagnosis of Right hip fracture  The various methods of treatment have been discussed with the patient and family. After consideration of risks, benefits and other options for treatment, the patient has consented to  Procedure(s): INTRAMEDULLARY (IM) NAIL FEMORAL (Right) as a surgical intervention .  The patient's history has been reviewed, patient examined, no change in status, stable for surgery.  I have reviewed the patient's chart and labs.  Questions were answered to the patient's satisfaction.    The risks, benefits, and alternatives were discussed with the patient. There are risks associated with the surgery including, but not limited to, problems with anesthesia (death), infection, differences in leg length/angulation/rotation, fracture of bones, loosening or failure of implants, malunion, nonunion, hematoma (blood accumulation) which may require surgical drainage, blood clots, pulmonary embolism, nerve injury (foot drop), and blood vessel injury. The patient understands these risks and elects to proceed.    Caitlyn [name], Caitlyn [name]

## 2016-05-31 NOTE — Progress Notes (Signed)
Received call at [phone] from [facility] main desk asking if pt ready to be picked up for surgery. Told staff member that MD was to evaluate her lab work this AM and then decide if she was still going to surgery. OR staff arrives to unit stating they spoke w/ MD and he said pt was cleared and ok to attend surgery. Will let day RN know.

## 2016-05-31 NOTE — Progress Notes (Signed)
Dear Doctor: Maudie Mercury This patient has been identified as a candidate for PICC for the following reason (s): IV therapy over 48 hours, poor veins/poor circulatory system (CHF, COPD, emphysema, diabetes, steroid use, IV drug abuse, etc.) and incompatible drugs (aminophyllin, TPN, heparin, given with an antibiotic) If you agree, please write an order for the indicated device. For any questions contact the Vascular Access Team at [phone] if no answer, please leave a message.  Thank you for supporting the early vascular access assessment program.

## 2016-05-31 NOTE — Progress Notes (Signed)
ANTICOAGULATION CONSULT NOTE - Follow Up Consult  Pharmacy Consult for Heparin Indication: chest pain/ACS  Allergies  Allergen Reactions  . Cortisone Anaphylaxis    Some forms of cortisone  . Monosodium Glutamate Anaphylaxis  . Prednisone Anaphylaxis    confusion  . Shellfish Allergy Anaphylaxis  . Readi-Cat [Barium Sulfate] Diarrhea    Patient does not want to drink Readi-Cat because it gives her diarrhea  . Benzoic Acid Nausea Only  . Buprenorphine Hcl Hypertension    Vomiting, shaking, & elevates BP  . Demerol [Meperidine]   . Morphine And Related Other (See Comments)    Vomiting, shaking, & elevates BP  . Sulfa Antibiotics    Patient Measurements: Height: 5\' 6"  (167.6 cm) Weight: 175 lb (79.4 kg) IBW/kg (Calculated) : 59.3 Heparin Dosing Weight: 75.7 kg  Vital Signs: Temp: 99 F (37.2 C) (01/21 1413) Temp Source: Oral (01/21 1413) BP: 178/69 (01/21 1413) Pulse Rate: 79 (01/21 1413)  Labs:  Recent Labs  05/29/16 0426 05/30/16 0406  05/30/16 1815 05/31/16 0018 05/31/16 0556 05/31/16 1932  HGB 12.7 12.1  --   --   --  11.2*  --   HCT 40.2 39.0  --   --   --  35.0*  --   PLT 185 148*  --   --   --  129*  --   HEPARINUNFRC  --   --   --   --   --   --  0.16*  CREATININE 0.96  --   --   --   --  0.61  --   TROPONINI  --   --   < > 0.08* 0.18* 0.20*  --   < > = values in this interval not displayed.  Estimated Creatinine Clearance: 67.5 mL/min (by C-G formula based on SCr of 0.61 mg/dL).  Medications:  Infusions:  . sodium chloride 50 mL/hr at 05/30/16 1718  . heparin 900 Units/hr (05/31/16 1407)   Assessment: 71 year old female who fell on the ice with right hip fracture in need of repair now with chest pain and rising troponin to start IV heparin. Earlier this evening patient lost IV access and  Infusion was held for unknown amount of time. Evening RN reports heparin has been infusing since her shift started at ~1900. Will avoid changes to therapy at this  time and order heparin level to be collected.   Heparin level subtherapeutic: 0.16 (IV access lost)  Goal of Therapy:  Heparin level 0.3-0.7 units/ml Monitor platelets by anticoagulation protocol: Yes   Plan:  Continue heparin infusion at 900 units/hr Check anti-Xa level in 8 hours and daily while on heparin Continue to monitor H&H and platelets  Georga Bora, PharmD Clinical Pharmacist Pager: [phone] 05/31/2016 8:31 PM

## 2016-05-31 NOTE — Progress Notes (Signed)
Notified ortho tech regarding pt need for overhead bed trapeze. None available at this time though pt was placed on list to receive.

## 2016-06-01 ENCOUNTER — Encounter (HOSPITAL_COMMUNITY): Payer: Self-pay | Admitting: Cardiovascular Disease

## 2016-06-01 ENCOUNTER — Encounter (HOSPITAL_COMMUNITY)
Admission: EM | Disposition: A | Discharge status: Skilled Nursing Facility | Payer: Self-pay | Source: Home / Self Care | Attending: Internal Medicine

## 2016-06-01 DIAGNOSIS — I10 Essential (primary) hypertension: Secondary | ICD-10-CM

## 2016-06-01 DIAGNOSIS — I25110 Atherosclerotic heart disease of native coronary artery with unstable angina pectoris: Secondary | ICD-10-CM

## 2016-06-01 DIAGNOSIS — I200 Unstable angina: Secondary | ICD-10-CM

## 2016-06-01 HISTORY — PX: CARDIAC CATHETERIZATION: SHX172

## 2016-06-01 LAB — COMPREHENSIVE METABOLIC PANEL
ALBUMIN: 2.5 g/dL — AB (ref 3.5–5.0)
ALK PHOS: 50 U/L (ref 38–126)
ALT: 10 U/L — ABNORMAL LOW (ref 14–54)
AST: 16 U/L (ref 15–41)
Anion gap: 7 (ref 5–15)
BUN: 8 mg/dL (ref 6–20)
CALCIUM: 8.1 mg/dL — AB (ref 8.9–10.3)
CO2: 27 mmol/L (ref 22–32)
Chloride: 104 mmol/L (ref 101–111)
Creatinine, Ser: 0.56 mg/dL (ref 0.44–1.00)
GFR calc Af Amer: >60 mL/min (ref >60)
GLUCOSE: 138 mg/dL — AB (ref 65–99)
Potassium: 3.4 mmol/L — ABNORMAL LOW (ref 3.5–5.1)
Sodium: 138 mmol/L (ref 135–145)
Total Bilirubin: 0.8 mg/dL (ref 0.3–1.2)
Total Protein: 5.3 g/dL — ABNORMAL LOW (ref 6.5–8.1)

## 2016-06-01 LAB — CBC
HEMATOCRIT: 32.8 % — AB (ref 36.0–46.0)
HEMOGLOBIN: 10.7 g/dL — AB (ref 12.0–15.0)
MCH: 29 pg (ref 26.0–34.0)
MCHC: 32.6 g/dL (ref 30.0–36.0)
MCV: 88.9 fL (ref 78.0–100.0)
Platelets: 142 10*3/uL — ABNORMAL LOW (ref 150–400)
RBC: 3.69 MIL/uL — ABNORMAL LOW (ref 3.87–5.11)
RDW: 13.2 % (ref 11.5–15.5)
WBC: 6.8 10*3/uL (ref 4.0–10.5)

## 2016-06-01 LAB — POCT ACTIVATED CLOTTING TIME: ACTIVATED CLOTTING TIME: 461 s

## 2016-06-01 LAB — HEPARIN LEVEL (UNFRACTIONATED): HEPARIN UNFRACTIONATED: 0.18 [IU]/mL — AB (ref 0.30–0.70)

## 2016-06-01 SURGERY — LEFT HEART CATH
Anesthesia: LOCAL

## 2016-06-01 MED ORDER — CLOPIDOGREL BISULFATE 300 MG PO TABS
ORAL_TABLET | ORAL | Status: AC
Start: 2016-06-01 — End: 2016-06-01
  Filled 2016-06-01: qty 1

## 2016-06-01 MED ORDER — VERAPAMIL HCL 2.5 MG/ML IV SOLN
INTRA_ARTERIAL | Status: DC | PRN
  Administered 2016-06-01 (×2): 5 mL via INTRA_ARTERIAL

## 2016-06-01 MED ORDER — VANCOMYCIN HCL IN DEXTROSE 1-5 GM/200ML-% IV SOLN
1000.0000 mg | Freq: Two times a day (BID) | INTRAVENOUS | Status: DC
  Administered 2016-06-02 – 2016-06-04 (×5): 1000 mg via INTRAVENOUS
  Filled 2016-06-01 (×8): qty 200

## 2016-06-01 MED ORDER — LIDOCAINE HCL (PF) 1 % IJ SOLN
INTRAMUSCULAR | Status: AC
  Filled 2016-06-01: qty 30

## 2016-06-01 MED ORDER — LIDOCAINE HCL (PF) 1 % IJ SOLN
INTRAMUSCULAR | Status: DC | PRN
  Administered 2016-06-01: 2 mL via INTRADERMAL

## 2016-06-01 MED ORDER — HYDROMORPHONE HCL 1 MG/ML IJ SOLN
INTRAMUSCULAR | Status: DC | PRN
  Administered 2016-06-01: 0.5 mg via INTRAVENOUS

## 2016-06-01 MED ORDER — HYDROMORPHONE HCL 1 MG/ML IJ SOLN
INTRAMUSCULAR | Status: AC
  Filled 2016-06-01: qty 1

## 2016-06-01 MED ORDER — CLOPIDOGREL BISULFATE 75 MG PO TABS
75.0000 mg | ORAL_TABLET | Freq: Every day | ORAL | Status: DC
  Administered 2016-06-02 – 2016-06-07 (×6): 75 mg via ORAL
  Filled 2016-06-01 (×7): qty 1

## 2016-06-01 MED ORDER — MIDAZOLAM HCL 2 MG/2ML IJ SOLN
INTRAMUSCULAR | Status: AC
  Filled 2016-06-01: qty 2

## 2016-06-01 MED ORDER — ASPIRIN 81 MG PO CHEW
81.0000 mg | CHEWABLE_TABLET | Freq: Every day | ORAL | Status: DC
  Administered 2016-06-02: 09:00:00 81 mg via ORAL
  Filled 2016-06-01: qty 1

## 2016-06-01 MED ORDER — SODIUM CHLORIDE 0.9 % IV SOLN: 250.0000 mL | INTRAVENOUS | Status: DC | PRN

## 2016-06-01 MED ORDER — SODIUM CHLORIDE 0.9 % WEIGHT BASED INFUSION: 3.0000 mL/kg/h | INTRAVENOUS | Status: DC

## 2016-06-01 MED ORDER — HYDROMORPHONE HCL 1 MG/ML IJ SOLN
1.0000 mg | INTRAMUSCULAR | Status: DC | PRN
  Administered 2016-06-01: 1 mg via INTRAVENOUS
  Filled 2016-06-01: qty 1

## 2016-06-01 MED ORDER — VERAPAMIL HCL 2.5 MG/ML IV SOLN
INTRAVENOUS | Status: AC
  Filled 2016-06-01: qty 2

## 2016-06-01 MED ORDER — SODIUM CHLORIDE 0.9% FLUSH: 3.0000 mL | Freq: Two times a day (BID) | INTRAVENOUS | Status: DC

## 2016-06-01 MED ORDER — ONDANSETRON HCL 4 MG/2ML IJ SOLN
4.0000 mg | Freq: Four times a day (QID) | INTRAMUSCULAR | Status: DC | PRN
  Administered 2016-06-01 – 2016-06-04 (×5): 4 mg via INTRAVENOUS
  Filled 2016-06-01 (×5): qty 2

## 2016-06-01 MED ORDER — ACETAMINOPHEN 325 MG PO TABS
650.0000 mg | ORAL_TABLET | ORAL | Status: DC | PRN
  Administered 2016-06-03 – 2016-06-07 (×12): 650 mg via ORAL
  Filled 2016-06-01 (×12): qty 2

## 2016-06-01 MED ORDER — IOPAMIDOL (ISOVUE-370) INJECTION 76%
INTRAVENOUS | Status: DC | PRN
  Administered 2016-06-01: 150 mL via INTRA_ARTERIAL

## 2016-06-01 MED ORDER — HYDRALAZINE HCL 20 MG/ML IJ SOLN
5.0000 mg | INTRAMUSCULAR | Status: AC | PRN
  Administered 2016-06-01: 16:00:00 5 mg via INTRAVENOUS
  Filled 2016-06-01: qty 1

## 2016-06-01 MED ORDER — HEPARIN (PORCINE) IN NACL 2-0.9 UNIT/ML-% IJ SOLN
INTRAMUSCULAR | Status: DC | PRN
  Administered 2016-06-01: 1000 mL

## 2016-06-01 MED ORDER — IOPAMIDOL (ISOVUE-370) INJECTION 76%
INTRAVENOUS | Status: AC
  Filled 2016-06-01: qty 100

## 2016-06-01 MED ORDER — CLOPIDOGREL BISULFATE 300 MG PO TABS
ORAL_TABLET | ORAL | Status: DC | PRN
  Administered 2016-06-01: 600 mg via ORAL

## 2016-06-01 MED ORDER — NITROGLYCERIN 1 MG/10 ML FOR IR/CATH LAB
INTRA_ARTERIAL | Status: AC
  Filled 2016-06-01: qty 10

## 2016-06-01 MED ORDER — HEPARIN SODIUM (PORCINE) 1000 UNIT/ML IJ SOLN
INTRAMUSCULAR | Status: DC | PRN
  Administered 2016-06-01: 4000 [IU] via INTRAVENOUS

## 2016-06-01 MED ORDER — MIDAZOLAM HCL 2 MG/2ML IJ SOLN
INTRAMUSCULAR | Status: DC | PRN
Start: 2016-06-01 — End: 2016-06-01
  Administered 2016-06-01: 1 mg via INTRAVENOUS

## 2016-06-01 MED ORDER — SODIUM CHLORIDE 0.9 % IV SOLN
INTRAVENOUS | Status: AC
  Administered 2016-06-01: 14:00:00 via INTRAVENOUS

## 2016-06-01 MED ORDER — SODIUM CHLORIDE 0.9 % WEIGHT BASED INFUSION: 1.0000 mL/kg/h | INTRAVENOUS | Status: DC

## 2016-06-01 MED ORDER — CLOPIDOGREL BISULFATE 300 MG PO TABS
ORAL_TABLET | ORAL | Status: AC
  Filled 2016-06-01: qty 1

## 2016-06-01 MED ORDER — BIVALIRUDIN 250 MG IV SOLR
INTRAVENOUS | Status: AC
  Filled 2016-06-01: qty 250

## 2016-06-01 MED ORDER — BIVALIRUDIN BOLUS VIA INFUSION - CUPID
INTRAVENOUS | Status: DC | PRN
Start: 2016-06-01 — End: 2016-06-01
  Administered 2016-06-01: 59.55 mg via INTRAVENOUS

## 2016-06-01 MED ORDER — LABETALOL HCL 5 MG/ML IV SOLN: 10.0000 mg | INTRAVENOUS | Status: AC | PRN

## 2016-06-01 MED ORDER — SODIUM CHLORIDE 0.9 % IV SOLN
1.7500 mg/kg/h | INTRAVENOUS | Status: AC
  Filled 2016-06-01 (×2): qty 250

## 2016-06-01 MED ORDER — POTASSIUM CHLORIDE CRYS ER 20 MEQ PO TBCR
40.0000 meq | EXTENDED_RELEASE_TABLET | Freq: Once | ORAL | Status: AC
  Administered 2016-06-01: 18:00:00 40 meq via ORAL
  Filled 2016-06-01: qty 2

## 2016-06-01 MED ORDER — SODIUM CHLORIDE 0.9% FLUSH
3.0000 mL | INTRAVENOUS | Status: DC | PRN
  Administered 2016-06-02: 10 mL via INTRAVENOUS
  Filled 2016-06-01: qty 3

## 2016-06-01 MED ORDER — SODIUM CHLORIDE 0.9% FLUSH: 3.0000 mL | INTRAVENOUS | Status: DC | PRN

## 2016-06-01 MED ORDER — HEPARIN BOLUS VIA INFUSION
2000.0000 [IU] | Freq: Once | INTRAVENOUS | Status: AC
Start: 2016-06-01 — End: 2016-06-01
  Administered 2016-06-01: 2000 [IU] via INTRAVENOUS
  Filled 2016-06-01: qty 2000

## 2016-06-01 MED ORDER — HEPARIN (PORCINE) IN NACL 2-0.9 UNIT/ML-% IJ SOLN
INTRAMUSCULAR | Status: AC
  Filled 2016-06-01: qty 500

## 2016-06-01 MED ORDER — HYDROMORPHONE HCL 1 MG/ML IJ SOLN
INTRAMUSCULAR | Status: DC | PRN
  Administered 2016-06-01 (×2): 0.5 mg via INTRAVENOUS

## 2016-06-01 MED ORDER — SODIUM CHLORIDE 0.9% FLUSH
3.0000 mL | Freq: Two times a day (BID) | INTRAVENOUS | Status: DC
  Administered 2016-06-01 – 2016-06-03 (×4): 3 mL via INTRAVENOUS

## 2016-06-01 MED ORDER — HYDRALAZINE HCL 20 MG/ML IJ SOLN
10.0000 mg | Freq: Once | INTRAMUSCULAR | Status: AC
Start: 2016-06-01 — End: 2016-06-01
  Administered 2016-06-01: 18:00:00 10 mg via INTRAVENOUS
  Filled 2016-06-01: qty 1

## 2016-06-01 MED ORDER — HEPARIN SODIUM (PORCINE) 1000 UNIT/ML IJ SOLN
INTRAMUSCULAR | Status: AC
  Filled 2016-06-01: qty 1

## 2016-06-01 MED ORDER — ASPIRIN 81 MG PO CHEW: 81.0000 mg | CHEWABLE_TABLET | ORAL | Status: DC

## 2016-06-01 MED ORDER — HYDROMORPHONE HCL 1 MG/ML IJ SOLN
1.0000 mg | INTRAMUSCULAR | Status: DC | PRN
  Administered 2016-06-01 – 2016-06-03 (×8): 1 mg via INTRAVENOUS
  Filled 2016-06-01 (×8): qty 1

## 2016-06-01 MED ORDER — SODIUM CHLORIDE 0.9 % IV SOLN
INTRAVENOUS | Status: DC | PRN
  Administered 2016-06-01: 1.75 mg/kg/h via INTRAVENOUS

## 2016-06-01 SURGICAL SUPPLY — 17 items
BALLN EMERGE MR 2.0X12 (BALLOONS) ×2
BALLOON EMERGE MR 2.0X12 (BALLOONS) IMPLANT
CATH OPTITORQUE TIG 4.0 5F (CATHETERS) ×1 IMPLANT
CATH VISTA GUIDE 6FR XBLAD3.0 (CATHETERS) ×1 IMPLANT
DEVICE RAD COMP TR BAND LRG (VASCULAR PRODUCTS) ×1 IMPLANT
GLIDESHEATH SLEND A-KIT 6F 22G (SHEATH) ×1 IMPLANT
GUIDEWIRE INQWIRE 1.5J.035X260 (WIRE) IMPLANT
HOVERMATT SINGLE USE (MISCELLANEOUS) ×1 IMPLANT
INQWIRE 1.5J .035X260CM (WIRE) ×2
KIT ENCORE 26 ADVANTAGE (KITS) ×1 IMPLANT
KIT HEART LEFT (KITS) ×2 IMPLANT
PACK CARDIAC CATHETERIZATION (CUSTOM PROCEDURE TRAY) ×2 IMPLANT
STENT SYNERGY DES 2.25X20 (Permanent Stent) ×1 IMPLANT
TRANSDUCER W/STOPCOCK (MISCELLANEOUS) ×2 IMPLANT
TUBING CIL FLEX 10 FLL-RA (TUBING) ×2 IMPLANT
WIRE ASAHI PROWATER 180CM (WIRE) ×1 IMPLANT
WIRE HI TORQ VERSACORE-J 145CM (WIRE) ×1 IMPLANT

## 2016-06-01 NOTE — Consult Note (Signed)
            [facility] CM Primary Care Navigator  06/01/2016  Caitlyn [name] [birth year] [id number]    Went to see patient earlier today at the bedside to identify possible discharge needs but staff states that she is having coronary stent intervention done and will not return back to the unit.  Will attempt to see patient when available, at another time in her new location.  For additional questions please contact:  Edwena Felty A. Anaiah Mcmannis, BSN, RN-BC [facility] PRIMARY CARE Navigator Cell: [phone]

## 2016-06-01 NOTE — Care Management Note (Addendum)
Case Management Note  Patient Details  Name: Caitlyn [name] MRN: [id number] Date of Birth: 1945-10-26  Subjective/Objective:  S/p coronary stent intervention, will be on plavix and asa, NSTEMI, left hip fx,  NCM will cont to follow for dc needs.                   Action/Plan:   Expected Discharge Date:                  Expected Discharge Plan:  Home/Self Care  In-House Referral:     Discharge planning Services  CM Consult  Post Acute Care Choice:    Choice offered to:     DME Arranged:    DME Agency:     HH Arranged:    HH Agency:     Status of Service:  In process, will continue to follow  If discussed at Long Length of Stay Meetings, dates discussed:    Additional Comments:  Zenon Mayo, RN 06/01/2016, 3:37 PM

## 2016-06-01 NOTE — Progress Notes (Signed)
Progress Note  Patient Name: Caitlyn [name] Date of Encounter: 06/01/2016  Primary Cardiologist: Dr. [name]  Subjective   Saw her post cath. No CP. Hip pain present.   Inpatient Medications    Scheduled Meds: . acetaminophen  1,000 mg Oral Q8H  . aspirin  81 mg Oral Daily  . carvedilol  12.5 mg Oral BID WC  . docusate sodium  100 mg Oral BID  . feeding supplement (ENSURE ENLIVE)  237 mL Oral Q1500  . levofloxacin (LEVAQUIN) IV  750 mg Intravenous Q24H  . levothyroxine  88 mcg Oral QAC breakfast  . lipase/protease/amylase  36,000 Units Oral Daily  . mupirocin ointment   Nasal BID  . pantoprazole  40 mg Oral BID  . rosuvastatin  10 mg Oral QODAY  . vancomycin  1,000 mg Intravenous Q12H   Continuous Infusions: . heparin 1,100 Units/hr (06/01/16 0637)   PRN Meds: bisacodyl, diphenhydrAMINE, hydrALAZINE, HYDROmorphone (DILAUDID) injection, HYDROmorphone, naLOXone (NARCAN)  injection, naLOXone (NARCAN)  injection, oxyCODONE, polyethylene glycol   Vital Signs    Vitals:   06/01/16 1226 06/01/16 1230 06/01/16 1235 06/01/16 1240  BP: (!) 194/88 (!) 177/81 (!) 199/97 (!) 174/90  Pulse: 80 73 77 81  Resp: 13 18 17  (!) 23  Temp:      TempSrc:      SpO2: 96% 91% 98% 96%  Weight:      Height:        Intake/Output Summary (Last 24 hours) at 06/01/16 1302 Last data filed at 06/01/16 1000  Gross per 24 hour  Intake              120 ml  Output             2950 ml  Net            -2830 ml   Filed Weights   05/28/16 1933  Weight: 175 lb (79.4 kg)    Telemetry    NSR - Personally Reviewed   ECG    No st changes - Personally Reviewed  Physical Exam   GEN: No acute distress.  Neck: No JVD Cardiac: RRR, no murmurs, rubs, or gallops.  Respiratory: Clear to auscultation bilaterally. GI: Soft, nontender, non-distended  MS: No edema; Right leg deformity. Neuro:  AAOx3. Psych: Normal affect  Labs    Chemistry  Recent Labs Lab 05/29/16 0426 05/31/16 0556  06/01/16 0548  NA 137 138 138  K 3.6 3.1* 3.4*  CL 105 106 104  CO2 24 26 27   GLUCOSE 151* 102* 138*  BUN 17 10 8   CREATININE 0.96 0.61 0.56  CALCIUM 8.4* 7.9* 8.1*  PROT  --  5.2* 5.3*  ALBUMIN  --  2.5* 2.5*  AST  --  17 16  ALT  --  10* 10*  ALKPHOS  --  57 50  BILITOT  --  1.0 0.8  GFRNONAA 58* >60 >60  GFRAA >60 >60 >60  ANIONGAP 8 6 7      Hematology  Recent Labs Lab 05/30/16 0406 05/31/16 0556 06/01/16 0548  WBC 9.2 8.9 6.8  RBC 4.20 3.87 3.69*  HGB 12.1 11.2* 10.7*  HCT 39.0 35.0* 32.8*  MCV 92.9 90.4 88.9  MCH 28.8 28.9 29.0  MCHC 31.0 32.0 32.6  RDW 13.9 13.6 13.2  PLT 148* 129* 142*    Cardiac Enzymes  Recent Labs Lab 05/30/16 1141 05/30/16 1815 05/31/16 0018 05/31/16 0556  TROPONINI <0.03 0.08* 0.18* 0.20*   No results for input(s): TROPIPOC in  the last 168 hours.   BNPNo results for input(s): BNP, PROBNP in the last 168 hours.   DDimer No results for input(s): DDIMER in the last 168 hours.   Radiology    Ct Angio Chest Pe W Or Wo Contrast  Result Date: 05/30/2016 CLINICAL DATA:  Chest pain. History of hypertension, hiatal hernia, RIGHT kidney mass. EXAM: CT ANGIOGRAPHY CHEST WITH CONTRAST TECHNIQUE: Multidetector CT imaging of the chest was performed using the standard protocol during bolus administration of intravenous contrast. Multiplanar CT image reconstructions and MIPs were obtained to evaluate the vascular anatomy. CONTRAST:  100 cc Isovue 370 COMPARISON:  CT abdomen and pelvis November 27, 2015 and chest radiograph June 07, 2016 at 0704 hours FINDINGS: CARDIOVASCULAR: Adequate contrast opacification of the pulmonary artery's. Main pulmonary artery is not enlarged. No pulmonary arterial filling defects to the level of the subsegmental branches. Heart size is normal, no right heart strain. No pericardial effusions. Thoracic aorta is normal course and caliber, trace calcific atherosclerosis. MEDIASTINUM/NODES: No lymphadenopathy by CT size  criteria. LUNGS/PLEURA: Tracheobronchial tree is patent, no pneumothorax. Hypoenhancing consolidation LEFT lower lobe with a component of atelectasis due to large hiatal hernia. Patchy ground-glass nodules bilateral lungs. Similar calcified possible hamartoma, measures 11 mm RIGHT middle lobe. UPPER ABDOMEN: Large hiatal hernia. Multifocal scarring RIGHT kidney. MUSCULOSKELETAL: Visualized soft tissues and included osseous structures appear normal. Review of the MIP images confirms the above findings. IMPRESSION: No acute pulmonary embolism. Scattered ground-glass opacities with LEFT lung consolidation most consistent with multifocal pneumonia. Large hiatal hernia with a component LEFT lower lobe atelectasis. Electronically Signed   By: Elon Alas M.D.   On: 05/30/2016 19:43    Cardiac Studies   ECHO - normal EF  Patient Profile     71 y.o. female with right hip fracture while walking dog with elevated troponin and chest pain, with no PE, large left hiatal hernia and surgery postponed due to increased troponin.   Assessment & Plan    NSTEMI/ CAD mid LAD 90% with DES placement 1/22  - Plavix, ASA  - Crestor 10  - Coreg 6.25 BID  - Tele.   - I'm OK with her staying in her current room   - I think surgery Wednesday would be reasonable, discussed with Dr. [name]. This will allow time for antiplatelets to reach steady state.   Right hip fx  - obviously she needs her surgical correction of this. This is a must.   GERD, hiatal hernia   PNA  - noted on CT  - ABX  - NO PE.   Signed, Candee Furbish, MD  06/01/2016, 1:02 PM

## 2016-06-01 NOTE — Interval H&P Note (Signed)
Cath Lab Visit (complete for each Cath Lab visit)  Clinical Evaluation Leading to the Procedure:   ACS: Yes.    Non-ACS:    Anginal Classification: CCS III  Anti-ischemic medical therapy: No Therapy  Non-Invasive Test Results: No non-invasive testing performed  Prior CABG: No previous CABG      History and Physical Interval Note:  06/01/2016 11:08 AM  Caitlyn [name]  has presented today for surgery, with the diagnosis of unstable angina  The various methods of treatment have been discussed with the patient and family. After consideration of risks, benefits and other options for treatment, the patient has consented to  Procedure(s): Left Heart Cath and Coronary Angiography (N/A) as a surgical intervention .  The patient's history has been reviewed, patient examined, no change in status, stable for surgery.  I have reviewed the patient's chart and labs.  Questions were answered to the patient's satisfaction.     Quay Burow

## 2016-06-01 NOTE — Care Management Important Message (Signed)
Important Message  Patient Details  Name: Caitlyn [name] MRN: [id number] Date of Birth: [birth year]   Medicare Important Message Given:  Yes    Abryanna Musolino 06/01/2016, 11:10 AM

## 2016-06-01 NOTE — Progress Notes (Signed)
[place] for Heparin Indication: chest pain/ACS  Allergies  Allergen Reactions  . Cortisone Anaphylaxis    Some forms of cortisone  . Monosodium Glutamate Anaphylaxis  . Prednisone Anaphylaxis    confusion  . Shellfish Allergy Anaphylaxis  . Readi-Cat [Barium Sulfate] Diarrhea    Patient does not want to drink Readi-Cat because it gives her diarrhea  . Benzoic Acid Nausea Only  . Buprenorphine Hcl Hypertension    Vomiting, shaking, & elevates BP  . Demerol [Meperidine]   . Morphine And Related Other (See Comments)    Vomiting, shaking, & elevates BP  . Sulfa Antibiotics    Patient Measurements: Height: 5\' 6"  (167.6 cm) Weight: 175 lb (79.4 kg) IBW/kg (Calculated) : 59.3 Heparin Dosing Weight: 75.7 kg  Vital Signs: Temp: 98.1 F (36.7 C) (01/22 0432) Temp Source: Oral (01/22 0432) BP: 174/77 (01/22 0432) Pulse Rate: 74 (01/22 0432)  Labs:  Recent Labs  05/30/16 0406  05/30/16 1815 05/31/16 0018 05/31/16 0556 05/31/16 1932 06/01/16 0548  HGB 12.1  --   --   --  11.2*  --  10.7*  HCT 39.0  --   --   --  35.0*  --  32.8*  PLT 148*  --   --   --  129*  --  142*  HEPARINUNFRC  --   --   --   --   --  0.16* 0.18*  CREATININE  --   --   --   --  0.61  --   --   TROPONINI  --   < > 0.08* 0.18* 0.20*  --   --   < > = values in this interval not displayed.  Estimated Creatinine Clearance: 67.5 mL/min (by C-G formula based on SCr of 0.61 mg/dL).  Assessment: 71 year old female with chest pain and elevated cardiac markers for heparin  Goal of Therapy:  Heparin level 0.3-0.7 units/ml Monitor platelets by anticoagulation protocol: Yes   Plan:  Heparin 2000 units IV bolus, then increase heparin 1100 units/hr F/U after cath today  Phillis Knack, PharmD, BCPS  06/01/2016 6:30 AM

## 2016-06-01 NOTE — Progress Notes (Signed)
PROGRESS NOTE  Caitlyn [name]  [id number] DOB: [birth year] DOA: 05/28/2016 PCP: Henrine Screws, MD Outpatient Specialists:  Subjective: Denies any complaints this morning, seen her with heard sister at bedside. Denies any chest pain, cath done and stent placed in the mid LAD  Brief Narrative:  Caitlyn [name] is a 71 y.o. female with a past medical history significant for hypothyroidism, HTN and iron def anemia who presents with hip pain after a fall. The patient was in her normal state of health until this evening when she was walking her dog, slipped on the ice, fell on her right hip and had immediate RIGHT hip pain and could not walk.   Assessment & Plan:   Principal Problem:   Nondisplaced intertrochanteric fracture of right femur, initial encounter for closed fracture [facility]) Active Problems:   Iron deficiency anemia   Essential hypertension   Hypothyroidism, acquired   Chest pain   Chest pain resolved, + trop peak 0.2 Echo 1/20=> EF 60% Patient had chest pain with troponin peak of 0.2. Started on IV heparin, beta blockers statin and aspirin, cardiac catheterization done on 1/22. Mid LAD stent placed, started on dual antiplatelets.  Non displaced intertrochanteric fracture on the right.  No surgery until further investigation of troponin elevation w cath  HCAP Cont vanco, cefepime, levaquin Cefepime discontinued, currently on vancomycin and levofloxacin.  Hypertension. continue BB and statin. HoldingACEi pre-operatively  Iron deficiency anemia. Hgb -WNL. Ferritin 90s in July. Is intolerant to oral iron, gets iron infusions with Dr. Beryle Beams.  Hypothyroidism. Continue levothyroxine  H/o asthma.No acute exacerbations. Exam is unremarkable   Pancreatic insufficiency Pt states on creon at home, ordered this   DVT prophylaxis: Angiomax Code Status: Full Code Family Communication:  Disposition Plan:  Diet: Diet Heart Room service appropriate? Yes;  Fluid consistency: Thin  Consultants:   Cardiology.  Orthopedics  Procedures:  Procedures  Coronary Stent Intervention  LEFT HEART CATH  Conclusion   Prox RCA lesion, 20 %stenosed.  Mid LAD lesion, 90 %stenosed.  Post intervention, there is a 0% residual stenosis.  A stent was successfully placed.   Antimicrobials:   None  Objective: Vitals:   06/01/16 1226 06/01/16 1230 06/01/16 1235 06/01/16 1240  BP: (!) 194/88 (!) 177/81 (!) 199/97 (!) 174/90  Pulse: 80 73 77 81  Resp: 13 18 17  (!) 23  Temp:      TempSrc:      SpO2: 96% 91% 98% 96%  Weight:      Height:        Intake/Output Summary (Last 24 hours) at 06/01/16 1407 Last data filed at 06/01/16 1000  Gross per 24 hour  Intake              120 ml  Output             2950 ml  Net            -2830 ml   Filed Weights   05/28/16 1933  Weight: 79.4 kg (175 lb)    Examination: General exam: Appears calm and comfortable  Respiratory system: Clear to auscultation. Respiratory effort normal. Cardiovascular system: S1 & S2 heard, RRR. No JVD, murmurs, rubs, gallops or clicks. No pedal edema. Gastrointestinal system: Abdomen is nondistended, soft and nontender. No organomegaly or masses felt. Normal bowel sounds heard. Central nervous system: Alert and oriented. No focal neurological deficits. Extremities: Symmetric 5 x 5 power. Skin: No rashes, lesions or ulcers Psychiatry: Judgement and insight appear normal. Mood & affect appropriate.  Data Reviewed: I have personally reviewed following labs and imaging studies  CBC:  Recent Labs Lab 05/28/16 1954 05/29/16 0426 05/30/16 0406 05/31/16 0556 06/01/16 0548  WBC 14.1* 11.9* 9.2 8.9 6.8  NEUTROABS 11.5*  --   --   --   --   HGB 13.8 12.7 12.1 11.2* 10.7*  HCT 41.9 40.2 39.0 35.0* 32.8*  MCV 88.6 91.0 92.9 90.4 88.9  PLT 203 185 148* 129* [id number]*   Basic Metabolic Panel:  Recent Labs Lab 05/28/16 1954 05/29/16 0426 05/31/16 0556 06/01/16 0548  NA  138 137 138 138  K 3.6 3.6 3.1* 3.4*  CL 104 105 106 104  CO2 21* 24 26 27   GLUCOSE 139* 151* 102* 138*  BUN 14 17 10 8   CREATININE 0.78 0.96 0.61 0.56  CALCIUM 8.9 8.4* 7.9* 8.1*   GFR: Estimated Creatinine Clearance: 67.5 mL/min (by C-G formula based on SCr of 0.56 mg/dL). Liver Function Tests:  Recent Labs Lab 05/31/16 0556 06/01/16 0548  AST 17 16  ALT 10* 10*  ALKPHOS 57 50  BILITOT 1.0 0.8  PROT 5.2* 5.3*  ALBUMIN 2.5* 2.5*   No results for input(s): LIPASE, AMYLASE in the last 168 hours. No results for input(s): AMMONIA in the last 168 hours. Coagulation Profile:  Recent Labs Lab 05/28/16 1954  INR 1.07   Cardiac Enzymes:  Recent Labs Lab 05/30/16 0604 05/30/16 1141 05/30/16 1815 05/31/16 0018 05/31/16 0556  TROPONINI 0.03* <0.03 0.08* 0.18* 0.20*   BNP (last 3 results) No results for input(s): PROBNP in the last 8760 hours. HbA1C: No results for input(s): HGBA1C in the last 72 hours. CBG: No results for input(s): GLUCAP in the last 168 hours. Lipid Profile: No results for input(s): CHOL, HDL, LDLCALC, TRIG, CHOLHDL, LDLDIRECT in the last 72 hours. Thyroid Function Tests: No results for input(s): TSH, T4TOTAL, FREET4, T3FREE, THYROIDAB in the last 72 hours. Anemia Panel: No results for input(s): VITAMINB12, FOLATE, FERRITIN, TIBC, IRON, RETICCTPCT in the last 72 hours. Urine analysis:    Component Value Date/Time   COLORURINE YELLOW 05/29/2016 1757   APPEARANCEUR CLEAR 05/29/2016 1757   LABSPEC >1.030 (H) 05/29/2016 1757   PHURINE 5.5 05/29/2016 1757   GLUCOSEU NEGATIVE 05/29/2016 1757   HGBUR NEGATIVE 05/29/2016 1757   [place] 05/29/2016 1757   KETONESUR NEGATIVE 05/29/2016 1757   PROTEINUR NEGATIVE 05/29/2016 1757   UROBILINOGEN 0.2 08/29/2013 0846   NITRITE NEGATIVE 05/29/2016 1757   LEUKOCYTESUR NEGATIVE 05/29/2016 1757   Sepsis Labs: @LABRCNTIP (procalcitonin:4,lacticidven:4)  ) Recent Results (from the past 240  hour(s))  Surgical pcr screen     Status: Abnormal   Collection Time: 05/29/16  2:09 AM  Result Value Ref Range Status   MRSA, PCR NEGATIVE NEGATIVE Final   Staphylococcus aureus POSITIVE (A) NEGATIVE Final    Comment:        The Xpert SA Assay (FDA approved for NASAL specimens in patients over 80 years of age), is one component of a comprehensive surveillance program.  Test performance has been validated by [facility] for patients greater than or equal to 72 year old. It is not intended to diagnose infection nor to guide or monitor treatment.      Invalid input(s): PROCALCITONIN, LACTICACIDVEN   Radiology Studies: Ct Angio Chest Pe W Or Wo Contrast  Result Date: 05/30/2016 CLINICAL DATA:  Chest pain. History of hypertension, hiatal hernia, RIGHT kidney mass. EXAM: CT ANGIOGRAPHY CHEST WITH CONTRAST TECHNIQUE: Multidetector CT imaging of the chest was performed using the  standard protocol during bolus administration of intravenous contrast. Multiplanar CT image reconstructions and MIPs were obtained to evaluate the vascular anatomy. CONTRAST:  100 cc Isovue 370 COMPARISON:  CT abdomen and pelvis November 27, 2015 and chest radiograph June 07, 2016 at 0704 hours FINDINGS: CARDIOVASCULAR: Adequate contrast opacification of the pulmonary artery's. Main pulmonary artery is not enlarged. No pulmonary arterial filling defects to the level of the subsegmental branches. Heart size is normal, no right heart strain. No pericardial effusions. Thoracic aorta is normal course and caliber, trace calcific atherosclerosis. MEDIASTINUM/NODES: No lymphadenopathy by CT size criteria. LUNGS/PLEURA: Tracheobronchial tree is patent, no pneumothorax. Hypoenhancing consolidation LEFT lower lobe with a component of atelectasis due to large hiatal hernia. Patchy ground-glass nodules bilateral lungs. Similar calcified possible hamartoma, measures 11 mm RIGHT middle lobe. UPPER ABDOMEN: Large hiatal hernia.  Multifocal scarring RIGHT kidney. MUSCULOSKELETAL: Visualized soft tissues and included osseous structures appear normal. Review of the MIP images confirms the above findings. IMPRESSION: No acute pulmonary embolism. Scattered ground-glass opacities with LEFT lung consolidation most consistent with multifocal pneumonia. Large hiatal hernia with a component LEFT lower lobe atelectasis. Electronically Signed   By: Elon Alas M.D.   On: 05/30/2016 19:43        Scheduled Meds: . acetaminophen  1,000 mg Oral Q8H  . aspirin  81 mg Oral Daily  . aspirin  81 mg Oral Daily  . carvedilol  12.5 mg Oral BID WC  . [START ON 06/02/2016] clopidogrel  75 mg Oral Q breakfast  . docusate sodium  100 mg Oral BID  . feeding supplement (ENSURE ENLIVE)  237 mL Oral Q1500  . levofloxacin (LEVAQUIN) IV  750 mg Intravenous Q24H  . levothyroxine  88 mcg Oral QAC breakfast  . lipase/protease/amylase  36,000 Units Oral Daily  . mupirocin ointment   Nasal BID  . pantoprazole  40 mg Oral BID  . rosuvastatin  10 mg Oral QODAY  . sodium chloride flush  3 mL Intravenous Q12H  . vancomycin  1,000 mg Intravenous Q12H   Continuous Infusions: . sodium chloride 75 mL/hr at 06/01/16 1330  . bivalirudin (ANGIOMAX) infusion 5 mg/mL (Cath Lab,ACS,PCI indication)       LOS: 4 days    Time spent: 35 minutes    Phuoc Huy A, MD [facility] Pager [phone]  If 7PM-7AM, please contact night-coverage [email or web address] Password [facility] 06/01/2016, 2:07 PM

## 2016-06-01 NOTE — H&P (View-Only) (Signed)
Progress Note  Patient Name: Caitlyn [name] Date of Encounter: 05/31/2016  Primary Cardiologist: Dr. [name]  Subjective   No further CP. Surgery cancelled again because of rapid response episode last evening, decreased responsiveness, given narcan, then complained of CP to back again. Troponin drawn and mildly increased.   Inpatient Medications    Scheduled Meds: . acetaminophen  1,000 mg Oral Q8H  . carvedilol  6.25 mg Oral BID WC  . ceFAZolin      . ceFEPime (MAXIPIME) IV  1 g Intravenous Q8H  . chlorhexidine  60 mL Topical Once  . docusate sodium  100 mg Oral BID  . feeding supplement (ENSURE ENLIVE)  237 mL Oral Q1500  . levofloxacin (LEVAQUIN) IV  750 mg Intravenous Q24H  . levothyroxine  88 mcg Oral QAC breakfast  . mupirocin ointment   Nasal BID  . pantoprazole  40 mg Oral BID  . povidone-iodine  2 application Topical Once  . rosuvastatin  10 mg Oral QODAY  . vancomycin  750 mg Intravenous Q12H   Continuous Infusions: . sodium chloride 50 mL/hr at 05/30/16 1718   PRN Meds: bisacodyl, diphenhydrAMINE, hydrALAZINE, HYDROmorphone (DILAUDID) injection, naLOXone (NARCAN)  injection, naLOXone (NARCAN)  injection, oxyCODONE, polyethylene glycol   Vital Signs    Vitals:   05/31/16 0320 05/31/16 0358 05/31/16 0537 05/31/16 0555  BP:   (!) 151/64   Pulse:   79   Resp:   16   Temp:   98.6 F (37 C)   TempSrc:   Oral   SpO2: 98% 94% 98% 98%  Weight:      Height:        Intake/Output Summary (Last 24 hours) at 05/31/16 1113 Last data filed at 05/31/16 0853  Gross per 24 hour  Intake             1630 ml  Output              800 ml  Net              830 ml   Filed Weights   05/28/16 1933  Weight: 175 lb (79.4 kg)    Telemetry    starting - Personally Reviewed  ECG    No st changes - Personally Reviewed  Physical Exam   GEN: No acute distress.  Neck: No JVD Cardiac: RRR, no murmurs, rubs, or gallops.  Respiratory: Clear to auscultation  bilaterally. GI: Soft, nontender, non-distended  MS: No edema; Right leg deformity. Neuro:  AAOx3. Psych: Normal affect  Labs    Chemistry Recent Labs Lab 05/28/16 1954 05/29/16 0426 05/31/16 0556  NA 138 137 138  K 3.6 3.6 3.1*  CL 104 105 106  CO2 21* 24 26  GLUCOSE 139* 151* 102*  BUN 14 17 10   CREATININE 0.78 0.96 0.61  CALCIUM 8.9 8.4* 7.9*  PROT  --   --  5.2*  ALBUMIN  --   --  2.5*  AST  --   --  17  ALT  --   --  10*  ALKPHOS  --   --  57  BILITOT  --   --  1.0  GFRNONAA >60 58* >60  GFRAA >60 >60 >60  ANIONGAP 13 8 6      Hematology Recent Labs Lab 05/29/16 0426 05/30/16 0406 05/31/16 0556  WBC 11.9* 9.2 8.9  RBC 4.42 4.20 3.87  HGB 12.7 12.1 11.2*  HCT 40.2 39.0 35.0*  MCV 91.0 92.9 90.4  MCH 28.7  28.8 28.9  MCHC 31.6 31.0 32.0  RDW 13.6 13.9 13.6  PLT 185 148* 129*    Cardiac Enzymes Recent Labs Lab 05/30/16 1141 05/30/16 1815 05/31/16 0018 05/31/16 0556  TROPONINI <0.03 0.08* 0.18* 0.20*   No results for input(s): TROPIPOC in the last 168 hours.   BNPNo results for input(s): BNP, PROBNP in the last 168 hours.   DDimer No results for input(s): DDIMER in the last 168 hours.   Radiology    Ct Angio Chest Pe W Or Wo Contrast  Result Date: 05/30/2016 CLINICAL DATA:  Chest pain. History of hypertension, hiatal hernia, RIGHT kidney mass. EXAM: CT ANGIOGRAPHY CHEST WITH CONTRAST TECHNIQUE: Multidetector CT imaging of the chest was performed using the standard protocol during bolus administration of intravenous contrast. Multiplanar CT image reconstructions and MIPs were obtained to evaluate the vascular anatomy. CONTRAST:  100 cc Isovue 370 COMPARISON:  CT abdomen and pelvis November 27, 2015 and chest radiograph June 07, 2016 at 0704 hours FINDINGS: CARDIOVASCULAR: Adequate contrast opacification of the pulmonary artery's. Main pulmonary artery is not enlarged. No pulmonary arterial filling defects to the level of the subsegmental branches.  Heart size is normal, no right heart strain. No pericardial effusions. Thoracic aorta is normal course and caliber, trace calcific atherosclerosis. MEDIASTINUM/NODES: No lymphadenopathy by CT size criteria. LUNGS/PLEURA: Tracheobronchial tree is patent, no pneumothorax. Hypoenhancing consolidation LEFT lower lobe with a component of atelectasis due to large hiatal hernia. Patchy ground-glass nodules bilateral lungs. Similar calcified possible hamartoma, measures 11 mm RIGHT middle lobe. UPPER ABDOMEN: Large hiatal hernia. Multifocal scarring RIGHT kidney. MUSCULOSKELETAL: Visualized soft tissues and included osseous structures appear normal. Review of the MIP images confirms the above findings. IMPRESSION: No acute pulmonary embolism. Scattered ground-glass opacities with LEFT lung consolidation most consistent with multifocal pneumonia. Large hiatal hernia with a component LEFT lower lobe atelectasis. Electronically Signed   By: Elon Alas M.D.   On: 05/30/2016 19:43   Dg Chest Port 1 View  Result Date: 05/30/2016 CLINICAL DATA:  Chest pain EXAM: PORTABLE CHEST 1 VIEW COMPARISON:  May 28, 2016 FINDINGS: No pneumothorax. Persistent hiatal hernia. Small layering left effusion and associated atelectasis not excluded. No other interval changes. IMPRESSION: Persistent large hiatal hernia. Small layering left effusion and associated atelectasis not excluded. Electronically Signed   By: Dorise Bullion III M.D   On: 05/30/2016 08:49    Cardiac Studies   ECHO - normal EF  Patient Profile     71 y.o. female with right hip fracture while walking dog with elevated troponin and chest pain, with no PE, large left hiatal hernia and surgery postponed due to increased troponin.   Assessment & Plan    NSTEMI  - possibly type 2 (demand ischemia) in the setting of her right hip fracture but it has increased from 0.08 to 0.2. She had another episode of chest pain on the evening of 1/20 in the setting of  rapid response (she was somnolent, narcan given).  - EF reassuring on ECHO  - Since she had another episode of discomfort, we will proceed with diagnostic left heart cath tomorrow. Risks and benefits explained including stroke MI death. Family member in room.   - Will start heparin IV for the next 24 hours.   - ASA 81  - Crestor 10  - Coreg 6.25 BID  - Tele.  - I'm OK with her staying in her current room since she is not having any active CP.   Explained that diagnostic cath  will help [place] determine further risk for surgery. Will need to further discuss if lesion is found.   Right hip fx  - obviously she needs her surgical correction of this. This is a must.   GERD, hiatal hernia  - may be culprit for chest pain??  - however troponin mildly elevated (however this may be secondary to underlying stress of hip fx, demand isch??)  - cath.   PNA  - noted on CT  - ABX  - NO PE.    Signed, Candee Furbish, MD  05/31/2016, 11:13 AM

## 2016-06-01 NOTE — Progress Notes (Signed)
Pharmacy Antibiotic Note  Caitlyn [name] is a 71 y.o. female admitted on 05/28/2016 with multifocal pneumonia.  Patient continues on vancomycin and levofloxacin.  Completed cefepime.  Her renal function is improving.  Plan: - Change Vanc to 1000mg  IV Q12H - Levaquin 750 mg IV Q24H - Monitor renal fxn, clinical progress, vanc trough at Css - F/U with narrowing vancomycin, KCL supplementation  Height: 5\' 6"  (167.6 cm) Weight: 175 lb (79.4 kg) IBW/kg (Calculated) : 59.3  Temp (24hrs), Avg:98.6 F (37 C), Min:98.1 F (36.7 C), Max:99 F (37.2 C)   Recent Labs Lab 05/28/16 1954 05/29/16 0426 05/30/16 0406 05/31/16 0556 06/01/16 0548  WBC 14.1* 11.9* 9.2 8.9 6.8  CREATININE 0.78 0.96  --  0.61 0.56    Estimated Creatinine Clearance: 67.5 mL/min (by C-G formula based on SCr of 0.56 mg/dL).    Allergies  Allergen Reactions  . Cortisone Anaphylaxis    Some forms of cortisone  . Monosodium Glutamate Anaphylaxis  . Prednisone Anaphylaxis    confusion  . Shellfish Allergy Anaphylaxis  . Readi-Cat [Barium Sulfate] Diarrhea    Patient does not want to drink Readi-Cat because it gives her diarrhea  . Benzoic Acid Nausea Only  . Buprenorphine Hcl Hypertension    Vomiting, shaking, & elevates BP  . Demerol [Meperidine]   . Morphine And Related Other (See Comments)    Vomiting, shaking, & elevates BP  . Sulfa Antibiotics     Antimicrobials this admission:  1/20 Cefepime >> 1/22 1/20 LVQ >>  1/20 vanc >>   Microbiology results:  1/20 Staph aureus PCR - positive   [name], PharmD, BCPS Pager:  [phone] 06/01/2016, 9:57 AM

## 2016-06-01 NOTE — Clinical Social Work Note (Signed)
CSW acknowledges SNF consult. Patient in need of PT evaluation.  Dayton Scrape, [place]

## 2016-06-02 ENCOUNTER — Encounter (HOSPITAL_COMMUNITY)
Admission: EM | Disposition: A | Discharge status: Skilled Nursing Facility | Payer: Self-pay | Source: Home / Self Care | Attending: Internal Medicine

## 2016-06-02 LAB — BASIC METABOLIC PANEL
ANION GAP: 10 (ref 5–15)
BUN: 8 mg/dL (ref 6–20)
CALCIUM: 8.6 mg/dL — AB (ref 8.9–10.3)
CHLORIDE: 101 mmol/L (ref 101–111)
CO2: 24 mmol/L (ref 22–32)
CREATININE: 0.71 mg/dL (ref 0.44–1.00)
GFR calc non Af Amer: >60 mL/min (ref >60)
Glucose, Bld: 109 mg/dL — ABNORMAL HIGH (ref 65–99)
Potassium: 3.8 mmol/L (ref 3.5–5.1)
Sodium: 135 mmol/L (ref 135–145)

## 2016-06-02 LAB — CBC
HEMATOCRIT: 33.7 % — AB (ref 36.0–46.0)
HEMOGLOBIN: 10.9 g/dL — AB (ref 12.0–15.0)
MCH: 28.5 pg (ref 26.0–34.0)
MCHC: 32.3 g/dL (ref 30.0–36.0)
MCV: 88.2 fL (ref 78.0–100.0)
Platelets: 185 10*3/uL (ref 150–400)
RBC: 3.82 MIL/uL — ABNORMAL LOW (ref 3.87–5.11)
RDW: 13.6 % (ref 11.5–15.5)
WBC: 6.6 10*3/uL (ref 4.0–10.5)

## 2016-06-02 SURGERY — INSERTION, INTRAMEDULLARY ROD, FEMUR
Anesthesia: General | Site: Leg Upper | Laterality: Right

## 2016-06-02 MED ORDER — POTASSIUM CHLORIDE CRYS ER 20 MEQ PO TBCR
40.0000 meq | EXTENDED_RELEASE_TABLET | Freq: Once | ORAL | Status: AC
  Administered 2016-06-02: 13:00:00 40 meq via ORAL
  Filled 2016-06-02: qty 2

## 2016-06-02 MED ORDER — FUROSEMIDE 10 MG/ML IJ SOLN
40.0000 mg | Freq: Once | INTRAMUSCULAR | Status: AC
  Administered 2016-06-02: 40 mg via INTRAVENOUS
  Filled 2016-06-02: qty 4

## 2016-06-02 MED ORDER — PROCHLORPERAZINE EDISYLATE 5 MG/ML IJ SOLN
10.0000 mg | Freq: Once | INTRAMUSCULAR | Status: AC
  Administered 2016-06-02: 10 mg via INTRAVENOUS
  Filled 2016-06-02: qty 2

## 2016-06-02 MED ORDER — IPRATROPIUM-ALBUTEROL 0.5-2.5 (3) MG/3ML IN SOLN
3.0000 mL | Freq: Three times a day (TID) | RESPIRATORY_TRACT | Status: DC
  Administered 2016-06-02 – 2016-06-03 (×2): 3 mL via RESPIRATORY_TRACT
  Filled 2016-06-02 (×2): qty 3

## 2016-06-02 MED ORDER — HEART ATTACK BOUNCING BOOK
Freq: Once | Status: AC
  Administered 2016-06-02: 1
  Filled 2016-06-02: qty 1

## 2016-06-02 MED ORDER — IPRATROPIUM-ALBUTEROL 0.5-2.5 (3) MG/3ML IN SOLN
3.0000 mL | RESPIRATORY_TRACT | Status: DC
  Administered 2016-06-02: 13:00:00 3 mL via RESPIRATORY_TRACT
  Filled 2016-06-02: qty 3

## 2016-06-02 MED ORDER — ANGIOPLASTY BOOK
Freq: Once | Status: AC
  Administered 2016-06-02: 1
  Filled 2016-06-02: qty 1

## 2016-06-02 NOTE — Progress Notes (Signed)
Pharmacy Antibiotic Note  Caitlyn [name] is a 71 y.o. female admitted on 05/28/2016 with multifocal pneumonia.  Patient continues on vancomycin and levofloxacin.  Completed cefepime.  Her renal function is improving.  Vancomycin Day # 4 -- consider stopping?  Plan: - Vancomycin  1000mg  IV Q12H - Levaquin 750 mg IV Q24H - Monitor renal fxn, clinical progress, vanc trough at Css - F/U with narrowing vancomycin, KCL supplementation  Height: 5\' 6"  (167.6 cm) Weight: 174 lb 2.6 oz (79 kg) IBW/kg (Calculated) : 59.3  Temp (24hrs), Avg:98.3 F (36.8 C), Min:98 F (36.7 C), Max:98.5 F (36.9 C)   Recent Labs Lab 05/28/16 1954 05/29/16 0426 05/30/16 0406 05/31/16 0556 06/01/16 0548 06/02/16 0220  WBC 14.1* 11.9* 9.2 8.9 6.8 6.6  CREATININE 0.78 0.96  --  0.61 0.56 0.71    Estimated Creatinine Clearance: 67.4 mL/min (by C-G formula based on SCr of 0.71 mg/dL).    Allergies  Allergen Reactions  . Cortisone Anaphylaxis    Some forms of cortisone  . Monosodium Glutamate Anaphylaxis  . Prednisone Anaphylaxis    confusion  . Shellfish Allergy Anaphylaxis  . Buprenorphine Hcl Nausea And Vomiting and Hypertension    SHAKING  . Morphine And Related Nausea And Vomiting, Other (See Comments) and Hypertension    SHAKING  . Readi-Cat [Barium Sulfate] Diarrhea    Patient does not want to drink Readi-Cat because it gives her diarrhea  . Demerol [Meperidine]     UNSPECIFIED REACTION   . Sulfa Antibiotics     UNSPECIFIED REACTION   . Benzoic Acid Nausea Only    Antimicrobials this admission:  1/20 Cefepime >> 1/22 1/20 LVQ >>  1/20 vanc >>   Microbiology results:  1/20 Staph aureus PCR - positive  Thank you Anette Guarneri, PharmD [phone] 06/02/2016, 11:15 AM

## 2016-06-02 NOTE — Progress Notes (Addendum)
Received in report that BP has been 200's/100's all day.  IV Hydralazine and po coreg given this am and IV lasix at lunch. Pt was to transfer to 5N when BP was better.  No neuro deficits or headache.  BP went from 207/88 to 184/87 after IV Dilaudid given for right hip pain.  SR 80's w/ occas PVC.  BP back up to 210/88 on left arm.  BP checked on right arm one time only for comparison, 203/71.  IV Hydralazine given, BP down to 169/77 on left arm.  Rt radial dressing level 0. Pt turned and repositioned frequently.  Ate very little of dinner, IV zofran given for nausea with fair relief.  NT bathed patient and washed hair.  Sister went home for night, pt has call light in reach and denies needs at this time.

## 2016-06-02 NOTE — Consult Note (Signed)
[facility] CM Primary Care Navigator  06/02/2016  Caitlyn [name] 12-23-1945 [id number]  Met with patient and sister Caitlyn [name] from [place]) at the bedside to identify possible discharge needs. Patient had been dozing on and off and states feeling nauseated. RN notified of patient's concern. Per sister, patient had increased right hip pain and unable to bear weight after she had slipped and fallen outside her home in the ice while walking her dog which had led to this admission.  Patient's hip fracture surgery had been cancelled/ postponed for further cardiac workup and will await until medically stable.  Patient endorses Dr. Josetta Huddle with [facility] at [facility] as the primary care provider.    Patient shared using [facility] in [place] to obtain medications without any problem.   Patient manages her own medications at home using "pill box" system weekly.   Patient's sister reports that patient lives alone, able to drive, active and independent with self care prior to admission. Patient's church friends may be able provide transportation to her doctors' appointments per sister.  Sister states has no decisions made yet regarding caregiver at home from pending surgery and patient still "not feeling that well", but will work on it as stated.  Anticipated discharge plan is skilled nursing facility but will still need physical therapy evaluation after surgery.  Patient and sister voiced understanding to call primary care provider's office when he returns home, for a post discharge follow-up appointment within a week or sooner if needs arise. Patient letter provided as a reminder.  Denies further needs or concerns at this time.   For additional questions please contact:  Edwena Felty A. Keaten Mashek, BSN, RN-BC [facility] PRIMARY CARE Navigator Cell: [phone]

## 2016-06-02 NOTE — Progress Notes (Signed)
Bathed, foley care, and hair washed by NT Keisha .  Pt appreciative of care.

## 2016-06-02 NOTE — Progress Notes (Signed)
Attempted to discuss MI, stent, Plavix, and CRPII. Somewhat successful with pt and sister however she felt poorly due to pain and nausea. Left materials and will refer to [facility]. We will attempt to f/u after ORIF. Sebastian, ACSM 9:20 AM 06/02/2016

## 2016-06-02 NOTE — Progress Notes (Addendum)
Progress Note  Patient Name: Caitlyn [name] Date of Encounter: 06/02/2016  Primary Cardiologist: Dr. [name]  Subjective   No CP. No SOB. Thankful.   Inpatient Medications    Scheduled Meds: . acetaminophen  1,000 mg Oral Q8H  . aspirin  81 mg Oral Daily  . aspirin  81 mg Oral Daily  . carvedilol  12.5 mg Oral BID WC  . clopidogrel  75 mg Oral Q breakfast  . docusate sodium  100 mg Oral BID  . feeding supplement (ENSURE ENLIVE)  237 mL Oral Q1500  . levofloxacin (LEVAQUIN) IV  750 mg Intravenous Q24H  . levothyroxine  88 mcg Oral QAC breakfast  . lipase/protease/amylase  36,000 Units Oral Daily  . mupirocin ointment   Nasal BID  . pantoprazole  40 mg Oral BID  . rosuvastatin  10 mg Oral QODAY  . sodium chloride flush  3 mL Intravenous Q12H  . vancomycin  1,000 mg Intravenous Q12H   Continuous Infusions:  PRN Meds: sodium chloride, acetaminophen, bisacodyl, hydrALAZINE, HYDROmorphone (DILAUDID) injection, naLOXone (NARCAN)  injection, naLOXone (NARCAN)  injection, ondansetron (ZOFRAN) IV, oxyCODONE, polyethylene glycol, sodium chloride flush   Vital Signs    Vitals:   06/02/16 0200 06/02/16 0300 06/02/16 0400 06/02/16 0758  BP: (!) 126/47 (!) 120/59 (!) 122/53 (!) 198/86  Pulse: 72 85 79 70  Resp: 14 (!) 25 13 16   Temp:   98.3 F (36.8 C) 98.5 F (36.9 C)  TempSrc:   Oral Oral  SpO2: 91% 92% (!) 89%   Weight:   174 lb 2.6 oz (79 kg)   Height:        Intake/Output Summary (Last 24 hours) at 06/02/16 0817 Last data filed at 06/02/16 0700  Gross per 24 hour  Intake             1562 ml  Output             3375 ml  Net            -1813 ml   Filed Weights   05/28/16 1933 06/02/16 0400  Weight: 175 lb (79.4 kg) 174 lb 2.6 oz (79 kg)    Telemetry    NSR - Personally Reviewed   ECG    No st changes - Personally Reviewed  Physical Exam   GEN: No acute distress.  Neck: No JVD Cardiac: RRR, no murmurs, rubs, or gallops.  Respiratory: Clear to auscultation  bilaterally. GI: Soft, nontender, non-distended  MS: No edema; Right leg deformity. Neuro:  AAOx3. Psych: Normal affect  Labs    Chemistry  Recent Labs Lab 05/31/16 0556 06/01/16 0548 06/02/16 0220  NA 138 138 135  K 3.1* 3.4* 3.8  CL 106 104 101  CO2 26 27 24   GLUCOSE 102* 138* 109*  BUN 10 8 8   CREATININE 0.61 0.56 0.71  CALCIUM 7.9* 8.1* 8.6*  PROT 5.2* 5.3*  --   ALBUMIN 2.5* 2.5*  --   AST 17 16  --   ALT 10* 10*  --   ALKPHOS 57 50  --   BILITOT 1.0 0.8  --   GFRNONAA >60 >60 >60  GFRAA >60 >60 >60  ANIONGAP 6 7 10      Hematology  Recent Labs Lab 05/31/16 0556 06/01/16 0548 06/02/16 0220  WBC 8.9 6.8 6.6  RBC 3.87 3.69* 3.82*  HGB 11.2* 10.7* 10.9*  HCT 35.0* 32.8* 33.7*  MCV 90.4 88.9 88.2  MCH 28.9 29.0 28.5  MCHC 32.0 32.6  32.3  RDW 13.6 13.2 13.6  PLT 129* 142* 185    Cardiac Enzymes  Recent Labs Lab 05/30/16 1141 05/30/16 1815 05/31/16 0018 05/31/16 0556  TROPONINI <0.03 0.08* 0.18* 0.20*   No results for input(s): TROPIPOC in the last 168 hours.   BNPNo results for input(s): BNP, PROBNP in the last 168 hours.   DDimer No results for input(s): DDIMER in the last 168 hours.   Radiology    No results found.  Cardiac Studies   ECHO - normal EF  Patient Profile     71 y.o. female with right hip fracture while walking dog with elevated troponin and chest pain, with no PE, large left hiatal hernia and surgery postponed due to increased troponin.   Assessment & Plan    NSTEMI/ CAD mid LAD 90% with DES placement 1/22  - Plavix, ASA  - Crestor 10  - Coreg 6.25 BID  - Tele. PVC only  - I think surgery Wednesday would be reasonable, discussed with Dr. [name]. This will allow time for antiplatelets to reach steady state. Obviously with NSTEMI (albiet small) this increases her overall cardiac risk.   Right hip fx  - obviously she needs her surgical correction of this. This is a must. Would proceed Wed if able.   GERD, hiatal  hernia   PNA  - noted on CT  - ABX  - NO PE.   Essential hypertension  - some BP measurements are normal. Likely driven in part by pain, hip. Monitor.   Signed, Candee Furbish, MD  06/02/2016, 8:17 AM

## 2016-06-02 NOTE — Progress Notes (Signed)
PROGRESS NOTE  Caitlyn [name]  [id number] DOB: [birth year] DOA: 05/28/2016 PCP: Henrine Screws, MD Outpatient Specialists:  Subjective: Feels okay this morning, denies any new complaints. Cardiac catheterization done on 1/22 and stent placed in the mid LAD, per cardiology will be ready for surgery on 1/24  Brief Narrative:  Caitlyn [name] is a 71 y.o. female with a past medical history significant for hypothyroidism, HTN and iron def anemia who presents with hip pain after a fall. The patient was in her normal state of health until this evening when she was walking her dog, slipped on the ice, fell on her right hip and had immediate RIGHT hip pain and could not walk.  While she was in the hospital developed chest pain and pneumonia, currently on antibiotics and chest pain seen by cardiology cardiac catheterization was done and a stent placed on the mid LAD on 1/22.  Assessment & Plan:   Principal Problem:   Nondisplaced intertrochanteric fracture of right femur, initial encounter for closed fracture [facility]) Active Problems:   Iron deficiency anemia   Essential hypertension   Hypothyroidism, acquired   Chest pain   Chest pain resolved, + trop peak 0.2 Echo 1/20=> EF 60% Patient had chest pain with troponin peak of 0.2. Started on IV heparin, beta blockers statin and aspirin, cardiac catheterization done on 1/22. Mid LAD stent placed, started on dual antiplatelets. Per cardiology patient ready for surgery on 1/24  Non displaced intertrochanteric fracture on the right.  I appreciate orthopedic surgery, now patient appears ready for surgery tomorrow.  HCAP Was on vancomycin, cefepime and levofloxacin. Cefepime discontinued, currently on vancomycin and levofloxacin. Continue supportive management with bronchodilators and mucolytics.  Hypertension. Continue BB and statin. HoldingACEi pre-operatively  Iron deficiency anemia. Hgb -WNL. Ferritin 90s in July. Is intolerant to  oral iron, gets iron infusions with Dr. Beryle Beams.  Hypothyroidism. Continue levothyroxine  H/o asthma.No acute exacerbations. Exam is unremarkable   Pancreatic insufficiency Pt states on creon at home, ordered this   DVT prophylaxis: Subcutaneous heparin Code Status: Full Code Family Communication:  Disposition Plan: Await hip surgery tomorrow Diet: Diet Heart Room service appropriate? Yes; Fluid consistency: Thin Diet NPO time specified Except for: Sips with Meds  Consultants:   Cardiology.  Orthopedics  Procedures:  Procedures  Coronary Stent Intervention  LEFT HEART CATH  Conclusion   Prox RCA lesion, 20 %stenosed.  Mid LAD lesion, 90 %stenosed.  Post intervention, there is a 0% residual stenosis.  A stent was successfully placed.   Antimicrobials:   None  Objective: Vitals:   06/02/16 0200 06/02/16 0300 06/02/16 0400 06/02/16 0758  BP: (!) 126/47 (!) 120/59 (!) 122/53 (!) 198/86  Pulse: 72 85 79 70  Resp: 14 (!) 25 13 16   Temp:   98.3 F (36.8 C) 98.5 F (36.9 C)  TempSrc:   Oral Oral  SpO2: 91% 92% (!) 89%   Weight:   79 kg (174 lb 2.6 oz)   Height:        Intake/Output Summary (Last 24 hours) at 06/02/16 1257 Last data filed at 06/02/16 1235  Gross per 24 hour  Intake             1562 ml  Output             2750 ml  Net            -1188 ml   Filed Weights   05/28/16 1933 06/02/16 0400  Weight: 79.4 kg (175 lb) 79 kg (174  lb 2.6 oz)    Examination: General exam: Appears calm and comfortable  Respiratory system: Clear to auscultation. Respiratory effort normal. Cardiovascular system: S1 & S2 heard, RRR. No JVD, murmurs, rubs, gallops or clicks. No pedal edema. Gastrointestinal system: Abdomen is nondistended, soft and nontender. No organomegaly or masses felt. Normal bowel sounds heard. Central nervous system: Alert and oriented. No focal neurological deficits. Extremities: Symmetric 5 x 5 power. Skin: No rashes, lesions or  ulcers Psychiatry: Judgement and insight appear normal. Mood & affect appropriate.   Data Reviewed: I have personally reviewed following labs and imaging studies  CBC:  Recent Labs Lab 05/28/16 1954 05/29/16 0426 05/30/16 0406 05/31/16 0556 06/01/16 0548 06/02/16 0220  WBC 14.1* 11.9* 9.2 8.9 6.8 6.6  NEUTROABS 11.5*  --   --   --   --   --   HGB 13.8 12.7 12.1 11.2* 10.7* 10.9*  HCT 41.9 40.2 39.0 35.0* 32.8* 33.7*  MCV 88.6 91.0 92.9 90.4 88.9 88.2  PLT 203 185 148* 129* 142* [id number]   Basic Metabolic Panel:  Recent Labs Lab 05/28/16 1954 05/29/16 0426 05/31/16 0556 06/01/16 0548 06/02/16 0220  NA 138 137 138 138 135  K 3.6 3.6 3.1* 3.4* 3.8  CL 104 105 106 104 101  CO2 21* 24 26 27 24   GLUCOSE 139* 151* 102* 138* 109*  BUN 14 17 10 8 8   CREATININE 0.78 0.96 0.61 0.56 0.71  CALCIUM 8.9 8.4* 7.9* 8.1* 8.6*   GFR: Estimated Creatinine Clearance: 67.4 mL/min (by C-G formula based on SCr of 0.71 mg/dL). Liver Function Tests:  Recent Labs Lab 05/31/16 0556 06/01/16 0548  AST 17 16  ALT 10* 10*  ALKPHOS 57 50  BILITOT 1.0 0.8  PROT 5.2* 5.3*  ALBUMIN 2.5* 2.5*   No results for input(s): LIPASE, AMYLASE in the last 168 hours. No results for input(s): AMMONIA in the last 168 hours. Coagulation Profile:  Recent Labs Lab 05/28/16 1954  INR 1.07   Cardiac Enzymes:  Recent Labs Lab 05/30/16 0604 05/30/16 1141 05/30/16 1815 05/31/16 0018 05/31/16 0556  TROPONINI 0.03* <0.03 0.08* 0.18* 0.20*   BNP (last 3 results) No results for input(s): PROBNP in the last 8760 hours. HbA1C: No results for input(s): HGBA1C in the last 72 hours. CBG: No results for input(s): GLUCAP in the last 168 hours. Lipid Profile: No results for input(s): CHOL, HDL, LDLCALC, TRIG, CHOLHDL, LDLDIRECT in the last 72 hours. Thyroid Function Tests: No results for input(s): TSH, T4TOTAL, FREET4, T3FREE, THYROIDAB in the last 72 hours. Anemia Panel: No results for input(s):  VITAMINB12, FOLATE, FERRITIN, TIBC, IRON, RETICCTPCT in the last 72 hours. Urine analysis:    Component Value Date/Time   COLORURINE YELLOW 05/29/2016 1757   APPEARANCEUR CLEAR 05/29/2016 1757   LABSPEC >1.030 (H) 05/29/2016 1757   PHURINE 5.5 05/29/2016 1757   GLUCOSEU NEGATIVE 05/29/2016 1757   HGBUR NEGATIVE 05/29/2016 1757   BILIRUBINUR NEGATIVE 05/29/2016 1757   KETONESUR NEGATIVE 05/29/2016 1757   PROTEINUR NEGATIVE 05/29/2016 1757   UROBILINOGEN 0.2 08/29/2013 0846   NITRITE NEGATIVE 05/29/2016 1757   LEUKOCYTESUR NEGATIVE 05/29/2016 1757   Sepsis Labs: @LABRCNTIP (procalcitonin:4,lacticidven:4)  ) Recent Results (from the past 240 hour(s))  Surgical pcr screen     Status: Abnormal   Collection Time: 05/29/16  2:09 AM  Result Value Ref Range Status   MRSA, PCR NEGATIVE NEGATIVE Final   Staphylococcus aureus POSITIVE (A) NEGATIVE Final    Comment:        The  Xpert SA Assay (FDA approved for NASAL specimens in patients over 74 years of age), is one component of a comprehensive surveillance program.  Test performance has been validated by [facility] for patients greater than or equal to 46 year old. It is not intended to diagnose infection nor to guide or monitor treatment.      Invalid input(s): PROCALCITONIN, [place]   Radiology Studies: No results found.      Scheduled Meds: . acetaminophen  1,000 mg Oral Q8H  . aspirin  81 mg Oral Daily  . carvedilol  12.5 mg Oral BID WC  . clopidogrel  75 mg Oral Q breakfast  . docusate sodium  100 mg Oral BID  . feeding supplement (ENSURE ENLIVE)  237 mL Oral Q1500  . ipratropium-albuterol  3 mL Nebulization Q4H  . levofloxacin (LEVAQUIN) IV  750 mg Intravenous Q24H  . levothyroxine  88 mcg Oral QAC breakfast  . lipase/protease/amylase  36,000 Units Oral Daily  . mupirocin ointment   Nasal BID  . pantoprazole  40 mg Oral BID  . rosuvastatin  10 mg Oral QODAY  . sodium chloride flush  3 mL Intravenous  Q12H  . vancomycin  1,000 mg Intravenous Q12H   Continuous Infusions:    LOS: 5 days    Time spent: 35 minutes    Azizah Lisle A, MD [facility] Pager [phone]  If 7PM-7AM, please contact night-coverage [email or web address] Password [facility] 06/02/2016, 12:57 PM

## 2016-06-03 ENCOUNTER — Inpatient Hospital Stay (HOSPITAL_COMMUNITY): Payer: Medicare Other

## 2016-06-03 ENCOUNTER — Inpatient Hospital Stay (HOSPITAL_COMMUNITY): Payer: Medicare Other | Admitting: Anesthesiology

## 2016-06-03 ENCOUNTER — Encounter (HOSPITAL_COMMUNITY): Payer: Self-pay | Admitting: Anesthesiology

## 2016-06-03 ENCOUNTER — Encounter (HOSPITAL_COMMUNITY)
Admission: EM | Disposition: A | Discharge status: Skilled Nursing Facility | Payer: Self-pay | Source: Home / Self Care | Attending: Internal Medicine

## 2016-06-03 DIAGNOSIS — S72141A Displaced intertrochanteric fracture of right femur, initial encounter for closed fracture: Secondary | ICD-10-CM | POA: Diagnosis present

## 2016-06-03 DIAGNOSIS — E039 Hypothyroidism, unspecified: Secondary | ICD-10-CM

## 2016-06-03 DIAGNOSIS — S72001A Fracture of unspecified part of neck of right femur, initial encounter for closed fracture: Secondary | ICD-10-CM

## 2016-06-03 DIAGNOSIS — I214 Non-ST elevation (NSTEMI) myocardial infarction: Secondary | ICD-10-CM

## 2016-06-03 DIAGNOSIS — S72001D Fracture of unspecified part of neck of right femur, subsequent encounter for closed fracture with routine healing: Secondary | ICD-10-CM

## 2016-06-03 DIAGNOSIS — Z955 Presence of coronary angioplasty implant and graft: Secondary | ICD-10-CM

## 2016-06-03 HISTORY — PX: FEMUR IM NAIL: SHX1597

## 2016-06-03 LAB — CBC
HEMATOCRIT: 36.5 % (ref 36.0–46.0)
Hemoglobin: 12 g/dL (ref 12.0–15.0)
MCH: 29 pg (ref 26.0–34.0)
MCHC: 32.9 g/dL (ref 30.0–36.0)
MCV: 88.2 fL (ref 78.0–100.0)
Platelets: 234 10*3/uL (ref 150–400)
RBC: 4.14 MIL/uL (ref 3.87–5.11)
RDW: 13.4 % (ref 11.5–15.5)
WBC: 7.8 10*3/uL (ref 4.0–10.5)

## 2016-06-03 LAB — BASIC METABOLIC PANEL
Anion gap: 8 (ref 5–15)
BUN: 10 mg/dL (ref 6–20)
CALCIUM: 8.8 mg/dL — AB (ref 8.9–10.3)
CO2: 27 mmol/L (ref 22–32)
Chloride: 101 mmol/L (ref 101–111)
Creatinine, Ser: 0.61 mg/dL (ref 0.44–1.00)
GFR calc non Af Amer: >60 mL/min (ref >60)
Glucose, Bld: 144 mg/dL — ABNORMAL HIGH (ref 65–99)
Potassium: 3.6 mmol/L (ref 3.5–5.1)
SODIUM: 136 mmol/L (ref 135–145)

## 2016-06-03 SURGERY — INSERTION, INTRAMEDULLARY ROD, FEMUR
Anesthesia: General | Site: Hip | Laterality: Right

## 2016-06-03 MED ORDER — FENTANYL CITRATE (PF) 100 MCG/2ML IJ SOLN
INTRAMUSCULAR | Status: AC
  Filled 2016-06-03: qty 2

## 2016-06-03 MED ORDER — PHENOL 1.4 % MT LIQD: 1.0000 | OROMUCOSAL | Status: DC | PRN

## 2016-06-03 MED ORDER — MENTHOL 3 MG MT LOZG: 1.0000 | LOZENGE | OROMUCOSAL | Status: DC | PRN

## 2016-06-03 MED ORDER — METHOCARBAMOL 500 MG PO TABS
500.0000 mg | ORAL_TABLET | Freq: Four times a day (QID) | ORAL | Status: DC | PRN
  Administered 2016-06-03 – 2016-06-07 (×10): 500 mg via ORAL
  Filled 2016-06-03 (×10): qty 1

## 2016-06-03 MED ORDER — METHOCARBAMOL 1000 MG/10ML IJ SOLN: 500.0000 mg | Freq: Four times a day (QID) | INTRAVENOUS | Status: DC | PRN

## 2016-06-03 MED ORDER — FENTANYL CITRATE (PF) 100 MCG/2ML IJ SOLN
INTRAMUSCULAR | Status: DC | PRN
  Administered 2016-06-03: 100 ug via INTRAVENOUS

## 2016-06-03 MED ORDER — 0.9 % SODIUM CHLORIDE (POUR BTL) OPTIME
TOPICAL | Status: DC | PRN
Start: 2016-06-03 — End: 2016-06-03
  Administered 2016-06-03: 50 mL

## 2016-06-03 MED ORDER — ROCURONIUM BROMIDE 100 MG/10ML IV SOLN
INTRAVENOUS | Status: DC | PRN
  Administered 2016-06-03: 40 mg via INTRAVENOUS

## 2016-06-03 MED ORDER — PROPOFOL 10 MG/ML IV BOLUS
INTRAVENOUS | Status: DC | PRN
  Administered 2016-06-03: 170 mg via INTRAVENOUS

## 2016-06-03 MED ORDER — IPRATROPIUM-ALBUTEROL 0.5-2.5 (3) MG/3ML IN SOLN: 3.0000 mL | Freq: Two times a day (BID) | RESPIRATORY_TRACT | Status: DC

## 2016-06-03 MED ORDER — DEXTROSE 5 % IV SOLN
INTRAVENOUS | Status: DC | PRN
  Administered 2016-06-03: 50 ug/min via INTRAVENOUS

## 2016-06-03 MED ORDER — CEFAZOLIN SODIUM-DEXTROSE 2-4 GM/100ML-% IV SOLN
2.0000 g | Freq: Four times a day (QID) | INTRAVENOUS | Status: AC
  Administered 2016-06-03 – 2016-06-04 (×2): 2 g via INTRAVENOUS
  Filled 2016-06-03 (×2): qty 100

## 2016-06-03 MED ORDER — PROPOFOL 10 MG/ML IV BOLUS
INTRAVENOUS | Status: AC
  Filled 2016-06-03: qty 20

## 2016-06-03 MED ORDER — METOCLOPRAMIDE HCL 5 MG PO TABS: 5.0000 mg | ORAL_TABLET | Freq: Three times a day (TID) | ORAL | Status: DC | PRN

## 2016-06-03 MED ORDER — LIDOCAINE HCL (CARDIAC) 20 MG/ML IV SOLN
INTRAVENOUS | Status: DC | PRN
  Administered 2016-06-03: 60 mg via INTRAVENOUS

## 2016-06-03 MED ORDER — LACTATED RINGERS IV SOLN
INTRAVENOUS | Status: DC | PRN
  Administered 2016-06-03: 17:00:00 via INTRAVENOUS

## 2016-06-03 MED ORDER — SUGAMMADEX SODIUM 200 MG/2ML IV SOLN
INTRAVENOUS | Status: DC | PRN
  Administered 2016-06-03: 200 mg via INTRAVENOUS

## 2016-06-03 MED ORDER — MUPIROCIN 2 % EX OINT
TOPICAL_OINTMENT | Freq: Two times a day (BID) | CUTANEOUS | Status: DC
Start: 2016-06-03 — End: 2016-06-07
  Administered 2016-06-04: 1 via NASAL
  Administered 2016-06-04 – 2016-06-05 (×2): via NASAL
  Administered 2016-06-05: 1 via NASAL
  Administered 2016-06-06 – 2016-06-07 (×3): via NASAL
  Filled 2016-06-03: qty 22

## 2016-06-03 MED ORDER — OXYCODONE HCL 5 MG PO TABS
5.0000 mg | ORAL_TABLET | ORAL | Status: DC | PRN
  Administered 2016-06-04: 10 mg via ORAL
  Filled 2016-06-03: qty 2

## 2016-06-03 MED ORDER — HYDROMORPHONE HCL 2 MG/ML IJ SOLN
0.5000 mg | INTRAMUSCULAR | Status: DC | PRN
  Administered 2016-06-03 – 2016-06-07 (×9): 0.5 mg via INTRAVENOUS
  Filled 2016-06-03 (×9): qty 1

## 2016-06-03 MED ORDER — ONDANSETRON HCL 4 MG/2ML IJ SOLN
INTRAMUSCULAR | Status: DC | PRN
  Administered 2016-06-03: 4 mg via INTRAVENOUS

## 2016-06-03 MED ORDER — HYDROMORPHONE HCL 2 MG/ML IJ SOLN
1.0000 mg | INTRAMUSCULAR | Status: DC | PRN
  Administered 2016-06-03: 1 mg via INTRAVENOUS
  Filled 2016-06-03: qty 1

## 2016-06-03 MED ORDER — SODIUM CHLORIDE 0.9 % IJ SOLN
INTRAMUSCULAR | Status: AC
  Filled 2016-06-03: qty 10

## 2016-06-03 MED ORDER — SUFENTANIL CITRATE 50 MCG/ML IV SOLN
INTRAVENOUS | Status: AC
  Filled 2016-06-03: qty 1

## 2016-06-03 MED ORDER — ALBUTEROL SULFATE (2.5 MG/3ML) 0.083% IN NEBU: 2.5000 mg | INHALATION_SOLUTION | Freq: Four times a day (QID) | RESPIRATORY_TRACT | Status: DC | PRN

## 2016-06-03 MED ORDER — METOCLOPRAMIDE HCL 5 MG/ML IJ SOLN: 5.0000 mg | Freq: Three times a day (TID) | INTRAMUSCULAR | Status: DC | PRN

## 2016-06-03 MED ORDER — FENTANYL CITRATE (PF) 100 MCG/2ML IJ SOLN
25.0000 ug | INTRAMUSCULAR | Status: DC | PRN
  Administered 2016-06-03 (×3): 50 ug via INTRAVENOUS

## 2016-06-03 MED ORDER — SUGAMMADEX SODIUM 200 MG/2ML IV SOLN
INTRAVENOUS | Status: AC
  Filled 2016-06-03: qty 2

## 2016-06-03 MED ORDER — CEFAZOLIN SODIUM-DEXTROSE 2-3 GM-% IV SOLR
INTRAVENOUS | Status: DC | PRN
  Administered 2016-06-03: 2 g via INTRAVENOUS

## 2016-06-03 SURGICAL SUPPLY — 49 items
ADH SKN CLS APL DERMABOND .7 (GAUZE/BANDAGES/DRESSINGS) ×2
ALCOHOL ISOPROPYL (RUBBING) (MISCELLANEOUS) ×3 IMPLANT
BIT DRILL CANN LG 4.3MM (BIT) IMPLANT
BNDG COHESIVE 4X5 TAN STRL (GAUZE/BANDAGES/DRESSINGS) ×3 IMPLANT
CHLORAPREP W/TINT 26ML (MISCELLANEOUS) ×3 IMPLANT
COVER PERINEAL POST (MISCELLANEOUS) ×3 IMPLANT
COVER SURGICAL LIGHT HANDLE (MISCELLANEOUS) ×3 IMPLANT
DERMABOND ADVANCED (GAUZE/BANDAGES/DRESSINGS) ×4
DERMABOND ADVANCED .7 DNX12 (GAUZE/BANDAGES/DRESSINGS) ×2 IMPLANT
DRAPE C-ARM 42X72 X-RAY (DRAPES) ×3 IMPLANT
DRAPE C-ARMOR (DRAPES) ×3 IMPLANT
DRAPE IMP U-DRAPE 54X76 (DRAPES) ×6 IMPLANT
DRAPE STERI IOBAN 125X83 (DRAPES) ×3 IMPLANT
DRAPE U-SHAPE 47X51 STRL (DRAPES) ×6 IMPLANT
DRAPE UNIVERSAL PACK (DRAPES) ×3 IMPLANT
DRILL BIT CANN LG 4.3MM (BIT) ×3
DRSG MEPILEX BORDER 4X4 (GAUZE/BANDAGES/DRESSINGS) ×4 IMPLANT
DRSG MEPILEX BORDER 4X8 (GAUZE/BANDAGES/DRESSINGS) ×2 IMPLANT
DRSG PAD ABDOMINAL 8X10 ST (GAUZE/BANDAGES/DRESSINGS) ×3 IMPLANT
ELECT REM PT RETURN 9FT ADLT (ELECTROSURGICAL) ×3
ELECTRODE REM PT RTRN 9FT ADLT (ELECTROSURGICAL) ×1 IMPLANT
FACESHIELD WRAPAROUND (MASK) ×3 IMPLANT
FACESHIELD WRAPAROUND OR TEAM (MASK) ×1 IMPLANT
GLOVE BIO SURGEON STRL SZ8.5 (GLOVE) ×6 IMPLANT
GLOVE BIOGEL PI IND STRL 8.5 (GLOVE) ×1 IMPLANT
GLOVE BIOGEL PI INDICATOR 8.5 (GLOVE) ×2
GOWN STRL REUS W/ TWL LRG LVL3 (GOWN DISPOSABLE) ×1 IMPLANT
GOWN STRL REUS W/TWL 2XL LVL3 (GOWN DISPOSABLE) ×3 IMPLANT
GOWN STRL REUS W/TWL LRG LVL3 (GOWN DISPOSABLE) ×3
GUIDEPIN 3.2X17.5 THRD DISP (PIN) ×2 IMPLANT
GUIDEWIRE BALL NOSE 80CM (WIRE) ×4 IMPLANT
HIP FRA NAIL LAG SCREW 10.5X90 (Orthopedic Implant) ×3 IMPLANT
KIT ROOM TURNOVER OR (KITS) ×3 IMPLANT
MANIFOLD NEPTUNE II (INSTRUMENTS) ×3 IMPLANT
MARKER SKIN DUAL TIP RULER LAB (MISCELLANEOUS) ×3 IMPLANT
NAIL HIP FRACT 130D 11X180 (Screw) ×2 IMPLANT
NS IRRIG 1000ML POUR BTL (IV SOLUTION) ×3 IMPLANT
PACK GENERAL/GYN (CUSTOM PROCEDURE TRAY) ×3 IMPLANT
PAD ARMBOARD 7.5X6 YLW CONV (MISCELLANEOUS) ×6 IMPLANT
PADDING CAST ABS 4INX4YD NS (CAST SUPPLIES) ×2
PADDING CAST ABS COTTON 4X4 ST (CAST SUPPLIES) ×1 IMPLANT
SCREW BONE CORTICAL 5.0X32 (Screw) ×2 IMPLANT
SCREW LAG HIP FRA NAIL 10.5X90 (Orthopedic Implant) IMPLANT
SUT MNCRL AB 3-0 PS2 27 (SUTURE) ×3 IMPLANT
SUT MON AB 2-0 CT1 27 (SUTURE) ×3 IMPLANT
SUT VIC AB 1 CT1 27 (SUTURE) ×3
SUT VIC AB 1 CT1 27XBRD ANBCTR (SUTURE) ×1 IMPLANT
TOWEL OR 17X24 6PK STRL BLUE (TOWEL DISPOSABLE) ×3 IMPLANT
TOWEL OR 17X26 10 PK STRL BLUE (TOWEL DISPOSABLE) ×3 IMPLANT

## 2016-06-03 NOTE — Interval H&P Note (Signed)
History and Physical Interval Note:  06/03/2016 5:21 PM  Caitlyn [name]  has presented today for surgery, with the diagnosis of Right femoral fracture  The various methods of treatment have been discussed with the patient and family. After consideration of risks, benefits and other options for treatment, the patient has consented to  Procedure(s): INTRAMEDULLARY (IM) NAIL FEMORAL (Right) as a surgical intervention .  The patient's history has been reviewed, patient examined, no change in status, stable for surgery.  I have reviewed the patient's chart and labs.  Questions were answered to the patient's satisfaction.     Rachael Ferrie, Horald Pollen

## 2016-06-03 NOTE — Transfer of Care (Signed)
Immediate Anesthesia Transfer of Care Note  Patient: Caitlyn [name]  Procedure(s) Performed: Procedure(s): INTRAMEDULLARY (IM) NAIL FEMORAL (Right)  Patient Location: PACU  Anesthesia Type:General  Level of Consciousness: awake  Airway & Oxygen Therapy: Patient Spontanous Breathing and Patient connected to face mask oxygen  Post-op Assessment: Report given to RN and Post -op Vital signs reviewed and stable  Post vital signs: Reviewed and stable  Last Vitals:  Vitals:   06/03/16 1500 06/03/16 1936  BP: (!) 175/79 (P) 135/82  Pulse: 86 87  Resp: 18 (!) 30  Temp: 36.9 C 36.3 C    Last Pain:  Vitals:   06/03/16 1519  TempSrc:   PainSc: 0-No pain      Patients Stated Pain Goal: 3 ([id number])  Complications: No apparent anesthesia complications

## 2016-06-03 NOTE — Progress Notes (Addendum)
Pt hypertensive during the night; per report has been hypertensive for two days. Reported to [place], RN on 5N to follow up with rounding provider in AM. Otherwise HTN has been treated with prn hydralazine q6. Is asymptomatic of HTN.

## 2016-06-03 NOTE — Discharge Instructions (Signed)
 Dr. Harla Mensch Adult Hip & Knee Specialist [facility] [address] [place], [place] [address] [phone]   POSTOPERATIVE DIRECTIONS    Hip Rehabilitation, Guidelines Following Surgery   WEIGHT BEARING Weight bearing as tolerated with assist device (walker, cane, etc) as directed, use it as long as suggested by your surgeon or therapist, typically at least 4-6 weeks.   HOME CARE INSTRUCTIONS  Remove items at home which could result in a fall. This includes throw rugs or furniture in walking pathways.  Continue medications as instructed at time of discharge.  You may have some home medications which will be placed on hold until you complete the course of blood thinner medication.  4 days after discharge, you may start showering. No tub baths or soaking your incisions. Do not put on socks or shoes without following the instructions of your caregivers.   Sit on chairs with arms. Use the chair arms to help push yourself up when arising.  Arrange for the use of a toilet seat elevator so you are not sitting low.   Walk with walker as instructed.  You may resume a sexual relationship in one month or when given the OK by your caregiver.  Use walker as long as suggested by your caregivers.  Avoid periods of inactivity such as sitting longer than an hour when not asleep. This helps prevent blood clots.  You may return to work once you are cleared by your surgeon.  Do not drive a car for 6 weeks or until released by your surgeon.  Do not drive while taking narcotics.  Wear elastic stockings for two weeks following surgery during the day but you may remove then at night.  Make sure you keep all of your appointments after your operation with all of your doctors and caregivers. You should call the office at the above phone number and make an appointment for approximately two weeks after the date of your surgery. Please pick up a stool softener and laxative  for home use as long as you are requiring pain medications.  ICE to the affected hip every three hours for 30 minutes at a time and then as needed for pain and swelling. Continue to use ice on the hip for pain and swelling from surgery. You may notice swelling that will progress down to the foot and ankle.  This is normal after surgery.  Elevate the leg when you are not up walking on it.   It is important for you to complete the blood thinner medication as prescribed by your doctor.  Continue to use the breathing machine which will help keep your temperature down.  It is common for your temperature to cycle up and down following surgery, especially at night when you are not up moving around and exerting yourself.  The breathing machine keeps your lungs expanded and your temperature down.  RANGE OF MOTION AND STRENGTHENING EXERCISES  These exercises are designed to help you keep full movement of your hip joint. Follow your caregiver's or physical therapist's instructions. Perform all exercises about fifteen times, three times per day or as directed. Exercise both hips, even if you have had only one joint replacement. These exercises can be done on a training (exercise) mat, on the floor, on a table or on a bed. Use whatever works the best and is most comfortable for you. Use music or television while you are exercising so that the exercises are a pleasant break in your day. This   will make your life better with the exercises acting as a break in routine you can look forward to.  Lying on your back, slowly slide your foot toward your buttocks, raising your knee up off the floor. Then slowly slide your foot back down until your leg is straight again.  Lying on your back spread your legs as far apart as you can without causing discomfort.  Lying on your side, raise your upper leg and foot straight up from the floor as far as is comfortable. Slowly lower the leg and repeat.  Lying on your back, tighten up the  muscle in the front of your thigh (quadriceps muscles). You can do this by keeping your leg straight and trying to raise your heel off the floor. This helps strengthen the largest muscle supporting your knee.  Lying on your back, tighten up the muscles of your buttocks both with the legs straight and with the knee bent at a comfortable angle while keeping your heel on the floor.   SKILLED REHAB INSTRUCTIONS: If the patient is transferred to a skilled rehab facility following release from the hospital, a list of the current medications will be sent to the facility for the patient to continue.  When discharged from the skilled rehab facility, please have the facility set up the patient's Home Health Physical Therapy prior to being released. Also, the skilled facility will be responsible for providing the patient with their medications at time of release from the facility to include their pain medication and their blood thinner medication. If the patient is still at the rehab facility at time of the two week follow up appointment, the skilled rehab facility will also need to assist the patient in arranging follow up appointment in our office and any transportation needs.  MAKE SURE YOU:  Understand these instructions.  Will watch your condition.  Will get help right away if you are not doing well or get worse.  Pick up stool softner and laxative for home use following surgery while on pain medications. Daily dry dressing changes as needed. In 4 days, you may remove your dressings and begin taking showers - no tub baths or soaking the incisions. Continue to use ice for pain and swelling after surgery. Do not use any lotions or creams on the incision until instructed by your surgeon.   

## 2016-06-03 NOTE — Care Management Note (Signed)
Case Management Note  Patient Details  Name: Tayelor Fabien MRN: [id number] Date of Birth: 03-Jul-1945  Subjective/Objective:   71 yr old female admitted 05/28/16 s/p fall with right hip fracture. Patient underwent a Heart Cath and PCI on 06/02/16.               Action/Plan: Patient has been cleared by cardiology for orthopedic surgery, will go to OR today for right hip IM Nailing. Case manager will continue to follow.   Expected Discharge Date:                  Expected Discharge Plan:  Home/Self Care  In-House Referral:     Discharge planning Services  CM Consult  Post Acute Care Choice:    Choice offered to:     DME Arranged:    DME Agency:     HH Arranged:    HH Agency:     Status of Service:  In process, will continue to follow  If discussed at Long Length of Stay Meetings, dates discussed:    Additional Comments:  Ninfa Meeker, RN 06/03/2016, 9:31 AM

## 2016-06-03 NOTE — Op Note (Signed)
OPERATIVE REPORT  SURGEON: Rod Can, MD   ASSISTANT: April Green, RNFA.  PREOPERATIVE DIAGNOSIS: Right intertrochanteric femur fracture.   POSTOPERATIVE DIAGNOSIS: Right intertrochanteric femur fracture.   PROCEDURE: Intramedullary fixation, Right femur.   IMPLANTS: Biomet Affixus, 11 by 180 mm, 130 degrees. 10.5 mm x 90 mm lag screw. 5.0 mm x 32 distal interlocking screw 1.  ANESTHESIA:  General  ESTIMATED BLOOD LOSS: 25 mL.  ANTIBIOTICS: 2 g Ancef.  DRAINS: None.  COMPLICATIONS: None.   CONDITION: PACU - hemodynamically stable.   BRIEF CLINICAL NOTE: Caitlyn [name] is a 71 y.o. female who presented with an intertrochanteric femur fracture. The patient was admitted to the hospitalist service and underwent perioperative risk stratification and medical optimization. She required cardiology evaluation due to chest pain and underwent heart catheterization and cardiac stenting. The risks, benefits, and alternatives to the procedure were explained, and the patient elected to proceed.  PROCEDURE IN DETAIL: Surgical site was marked by myself. The patient was taken to the operating room and anesthesia was induced on the bed. The patient was then transferred to the [facility] table and the nonoperative lower extremity was scissored underneath the operative side. The fracture was reduced with traction, internal rotation, and adduction. The hip was prepped and draped in the normal sterile surgical fashion. Timeout was called verifying side and site of surgery. Preop antibiotics were given with 60 minutes of beginning the procedure.  Fluoroscopy was used to define the patient's anatomy. A 4 cm incision was made just proximal to the tip of the greater trochanter. The awl was used to obtain the standard starting point for a trochanteric entry nail under fluoroscopic control. The guidepin was placed. The entry reamer was used to open the proximal femur.  On the back table, the nail was assembled  onto the jig. I attempted to place the nail, and the canal was tight. I placed the guidewire to the level of the physeal scar of the knee. Sequential reaming was performed up to a size 12.5 mm with excellent chatter. The nail was placed into the femur without any difficulty. Through a separate stab incision, the cannula was placed down to the bone in preparation for the cephalomedullary device. A guidepin was placed into the femoral head using AP and lateral fluoroscopy views. The pin was measured, and then reaming was performed to the appropriate depth. The lag screw was placed. The fracture was compressed through the jig. The setscrew was tightened and then loosened one quarter turn. A separate stab incision was created, and the distal interlocking screw was placed using standard AO technique. The jig was removed. Final AP and lateral fluoroscopy views were obtained to confirm fracture reduction and hardware placement. Tip apex distance was appropriate. There was no chondral penetration.  The wounds were copiously irrigated with saline. The wound was closed in layers with #1 Vicryl for the fascia, 2-0 Monocryl for the deep dermal layer, and 3-0 Monocryl subcuticular stitch. Glue was applied to the skin. Once the glue was fully hardened, sterile dressing was applied. The patient was then awakened from anesthesia and taken to the PACU in stable condition. Sponge needle and instrument counts were correct at the end of the case 2. There were no known complications.  We will readmit the patient to the hospitalist. Weightbearing status will be weight bearing as tolerated with a walker. We will continue aspirin and plavix for DVT prophylaxis. The patient will work with physical therapy and undergo disposition planning.

## 2016-06-03 NOTE — Progress Notes (Signed)
PROGRESS NOTE    Caitlyn [name]  [id number] DOB: [birth year] DOA: 05/28/2016 PCP: Henrine Screws, MD    Brief Narrative:  Caitlyn [name] a 71 y.o.femalewith a past medical history significant for hypothyroidism, HTN and iron def anemiawho presents with hip pain after a fall. The patient was in hernormal state of health until this evening when she was walking her dog, slipped on the ice, fell on her right hipand had immediate RIGHThip pain and could not walk.  While she was in the hospital developed chest pain and pneumonia, currently on antibiotics and chest pain seen by cardiology cardiac catheterization was done and a stent placed on the mid LAD on 1/22.    Assessment & Plan:   Principal Problem:   Closed fracture of right hip (HCC) Active Problems:   Iron deficiency anemia   Essential hypertension   Hypothyroidism, acquired   Chest pain  Non-STEMI /coronary artery disease mid LAD 90% stenosis with drug-eluting stent placement 06/01/2016 per cardiac catheterization. Patient would recent cardiac catheterization after complaints of chest pain and slightly elevated troponin. Patient status post drug-eluting stent to the mid LAD. Patient has been seen by cardiology during the hospitalization and recommend that patient's aspirin and Plavix be continued during surgery due to recent stent placement on 06/01/2016. Echo 1/20=> EF 60% Patient had chest pain with troponin peak of 0.2. Started on IV heparin, beta blockers statin and aspirin, cardiac catheterization done on 1/22. Continue other cardiac medications of Crestor, Coreg, Per cardiology patient ready for surgery on 1/24  Non displaced intertrochanteric fracture on the right.  Patient to the operating room today for surgical repair of hip fracture. Per orthopedics.  HCAP Was on vancomycin, cefepime and levofloxacin. Cefepime discontinued, currently on vancomycin and levofloxacin. Continue supportive management  with bronchodilators and mucolytics.  Hypertension. Continue BB and statin. HoldingACEi pre-operatively  Iron deficiency anemia. Hgb -WNL. Ferritin 90s in July. Is intolerant to oral iron, gets iron infusions with Dr. Beryle Beams. H&H stable. Follow postoperatively.  Hypothyroidism. Continue levothyroxine  H/o asthma.No acute exacerbations. Exam is unremarkable. Continue scheduled nebulizers.  Pancreatic insufficiency Pt currently nothing by mouth in preparation for surgery this afternoon and a such Creon on hold. Postoperatively will resume Creon once patient started on a diet.    DVT prophylaxis: SCDs. Per orthopedics. Code Status: Full Family Communication: Updated patient and family at bedside. Disposition Plan: Pending repair of right intertrochanteric fracture and per orthopedics and cardiology.   Consultants:   Orthopedics: Dr.Swinteck 05/29/2016  Cardiology: Dr.Skains 05/30/2016  Procedures:   CT angiogram chest 05/30/2016  Chest x-ray 05/28/2016, 05/30/2016  2-D echo 05/30/2016  Cardiac catheterization 06/01/2016. Dr. [name]--- proximal RCA 20% stenosed, mid LAD lesion 90% stenosed. Postintervention there is a 0% residual stenosis. A drug-eluting stent was successfully placed  Plain films of the right hip and pelvis 05/28/2016  Antimicrobials:   IV cefepime 05/30/2016>>>> 06/01/2016  IV Levaquin 05/30/2016  IV vancomycin 05/30/2016   Subjective: Patient feeling somewhat anxious. Patient denies any chest pain. Patient asking whether she'll get aspirin and Plavix today prior to surgery.  Objective: Vitals:   06/03/16 0303 06/03/16 0330 06/03/16 0446 06/03/16 1007  BP: (!) 201/95 (!) 176/62 (!) 166/67   Pulse:   91   Resp: 14 17 17    Temp:   98.3 F (36.8 C)   TempSrc:   Oral   SpO2:   96% 93%  Weight:      Height:        Intake/Output Summary (Last 24 hours)  at 06/03/16 1406 Last data filed at 06/03/16 0900  Gross per 24 hour    Intake                0 ml  Output             2950 ml  Net            -2950 ml   Filed Weights   05/28/16 1933 06/02/16 0400  Weight: 79.4 kg (175 lb) 79 kg (174 lb 2.6 oz)    Examination:  General exam: Appears calm and comfortable  Respiratory system: Clear to auscultation. Respiratory effort normal. Cardiovascular system: S1 & S2 heard, RRR. No JVD, murmurs, rubs, gallops or clicks. No pedal edema. Gastrointestinal system: Abdomen is nondistended, soft and nontender. No organomegaly or masses felt. Normal bowel sounds heard. Central nervous system: Alert and oriented. No focal neurological deficits. Extremities: Symmetric 5 x 5 power. Skin: No rashes, lesions or ulcers Psychiatry: Judgement and insight appear normal. Mood & affect appropriate.     Data Reviewed: I have personally reviewed following labs and imaging studies  CBC:  Recent Labs Lab 05/28/16 1954  05/30/16 0406 05/31/16 0556 06/01/16 0548 06/02/16 0220 06/03/16 0219  WBC 14.1*  < > 9.2 8.9 6.8 6.6 7.8  NEUTROABS 11.5*  --   --   --   --   --   --   HGB 13.8  < > 12.1 11.2* 10.7* 10.9* 12.0  HCT 41.9  < > 39.0 35.0* 32.8* 33.7* 36.5  MCV 88.6  < > 92.9 90.4 88.9 88.2 88.2  PLT 203  < > 148* 129* 142* 185 234  < > = values in this interval not displayed. Basic Metabolic Panel:  Recent Labs Lab 05/29/16 0426 05/31/16 0556 06/01/16 0548 06/02/16 0220 06/03/16 0219  NA 137 138 138 135 136  K 3.6 3.1* 3.4* 3.8 3.6  CL 105 106 104 101 101  CO2 24 26 27 24 27   GLUCOSE 151* 102* 138* 109* 144*  BUN 17 10 8 8 10   CREATININE 0.96 0.61 0.56 0.71 0.61  CALCIUM 8.4* 7.9* 8.1* 8.6* 8.8*   GFR: Estimated Creatinine Clearance: 67.4 mL/min (by C-G formula based on SCr of 0.61 mg/dL). Liver Function Tests:  Recent Labs Lab 05/31/16 0556 06/01/16 0548  AST 17 16  ALT 10* 10*  ALKPHOS 57 50  BILITOT 1.0 0.8  PROT 5.2* 5.3*  ALBUMIN 2.5* 2.5*   No results for input(s): LIPASE, AMYLASE in the last  168 hours. No results for input(s): AMMONIA in the last 168 hours. Coagulation Profile:  Recent Labs Lab 05/28/16 1954  INR 1.07   Cardiac Enzymes:  Recent Labs Lab 05/30/16 0604 05/30/16 1141 05/30/16 1815 05/31/16 0018 05/31/16 0556  TROPONINI 0.03* <0.03 0.08* 0.18* 0.20*   BNP (last 3 results) No results for input(s): PROBNP in the last 8760 hours. HbA1C: No results for input(s): HGBA1C in the last 72 hours. CBG: No results for input(s): GLUCAP in the last 168 hours. Lipid Profile: No results for input(s): CHOL, HDL, LDLCALC, TRIG, CHOLHDL, LDLDIRECT in the last 72 hours. Thyroid Function Tests: No results for input(s): TSH, T4TOTAL, FREET4, T3FREE, THYROIDAB in the last 72 hours. Anemia Panel: No results for input(s): VITAMINB12, FOLATE, FERRITIN, TIBC, IRON, RETICCTPCT in the last 72 hours. Sepsis Labs: No results for input(s): PROCALCITON, LATICACIDVEN in the last 168 hours.  Recent Results (from the past 240 hour(s))  Surgical pcr screen     Status: Abnormal  Collection Time: 05/29/16  2:09 AM  Result Value Ref Range Status   MRSA, PCR NEGATIVE NEGATIVE Final   Staphylococcus aureus POSITIVE (A) NEGATIVE Final    Comment:        The Xpert SA Assay (FDA approved for NASAL specimens in patients over 85 years of age), is one component of a comprehensive surveillance program.  Test performance has been validated by [facility] for patients greater than or equal to 86 year old. It is not intended to diagnose infection nor to guide or monitor treatment.          Radiology Studies: No results found.      Scheduled Meds: . acetaminophen  1,000 mg Oral Q8H  . aspirin  81 mg Oral Daily  . carvedilol  12.5 mg Oral BID WC  . clopidogrel  75 mg Oral Q breakfast  . docusate sodium  100 mg Oral BID  . feeding supplement (ENSURE ENLIVE)  237 mL Oral Q1500  . ipratropium-albuterol  3 mL Nebulization BID  . levofloxacin (LEVAQUIN) IV  750 mg  Intravenous Q24H  . levothyroxine  88 mcg Oral QAC breakfast  . lipase/protease/amylase  36,000 Units Oral Daily  . pantoprazole  40 mg Oral BID  . rosuvastatin  10 mg Oral QODAY  . sodium chloride flush  3 mL Intravenous Q12H  . vancomycin  1,000 mg Intravenous Q12H   Continuous Infusions:   LOS: 6 days    Time spent: 40 mins    Danaya Geddis, MD [facility] Pager [phone]  If 7PM-7AM, please contact night-coverage [email or web address] Password [email or web address] 06/03/2016, 2:06 PM

## 2016-06-03 NOTE — Anesthesia Postprocedure Evaluation (Signed)
Anesthesia Post Note  Patient: Caitlyn [name]  Procedure(s) Performed: Procedure(s) (LRB): INTRAMEDULLARY (IM) NAIL FEMORAL (Right)  Patient location during evaluation: PACU Anesthesia Type: General Level of consciousness: awake Pain management: pain level controlled Vital Signs Assessment: post-procedure vital signs reviewed and stable Respiratory status: spontaneous breathing Cardiovascular status: stable Anesthetic complications: no       Last Vitals:  Vitals:   06/03/16 2022 06/03/16 2053  BP: (!) 159/84 (!) 144/80  Pulse: 87 83  Resp: 14 15  Temp: 36.4 C 36.9 C    Last Pain:  Vitals:   06/03/16 2053  TempSrc: Oral  PainSc:                  Alyjah Lovingood

## 2016-06-03 NOTE — Progress Notes (Signed)
Progress Note  Patient Name: Caitlyn [name] Date of Encounter: 06/03/2016  Primary Cardiologist: Dr. [name]  Subjective   No CP. Mild SOB. Thankful.   Inpatient Medications    Scheduled Meds: . acetaminophen  1,000 mg Oral Q8H  . aspirin  81 mg Oral Daily  . carvedilol  12.5 mg Oral BID WC  . clopidogrel  75 mg Oral Q breakfast  . docusate sodium  100 mg Oral BID  . feeding supplement (ENSURE ENLIVE)  237 mL Oral Q1500  . ipratropium-albuterol  3 mL Nebulization BID  . levofloxacin (LEVAQUIN) IV  750 mg Intravenous Q24H  . levothyroxine  88 mcg Oral QAC breakfast  . lipase/protease/amylase  36,000 Units Oral Daily  . pantoprazole  40 mg Oral BID  . rosuvastatin  10 mg Oral QODAY  . sodium chloride flush  3 mL Intravenous Q12H  . vancomycin  1,000 mg Intravenous Q12H   Continuous Infusions:  PRN Meds: sodium chloride, acetaminophen, albuterol, bisacodyl, hydrALAZINE, HYDROmorphone (DILAUDID) injection, naLOXone (NARCAN)  injection, naLOXone (NARCAN)  injection, ondansetron (ZOFRAN) IV, oxyCODONE, polyethylene glycol, sodium chloride flush   Vital Signs    Vitals:   06/03/16 0303 06/03/16 0330 06/03/16 0446 06/03/16 1007  BP: (!) 201/95 (!) 176/62 (!) 166/67   Pulse:   91   Resp: 14 17 17    Temp:   98.3 F (36.8 C)   TempSrc:   Oral   SpO2:   96% 93%  Weight:      Height:        Intake/Output Summary (Last 24 hours) at 06/03/16 1026 Last data filed at 06/03/16 0900  Gross per 24 hour  Intake                0 ml  Output             3675 ml  Net            -3675 ml   Filed Weights   05/28/16 1933 06/02/16 0400  Weight: 175 lb (79.4 kg) 174 lb 2.6 oz (79 kg)    Telemetry    NSR - Personally Reviewed   ECG    No st changes - Personally Reviewed  Physical Exam   GEN: No acute distress.  Neck: No JVD Cardiac: RRR, no murmurs, rubs, or gallops.  Respiratory: Clear to auscultation bilaterally. GI: Soft, nontender, non-distended  MS: No edema; Right  leg deformity. Neuro:  AAOx3. Psych: Normal affect  Labs    Chemistry  Recent Labs Lab 05/31/16 0556 06/01/16 0548 06/02/16 0220 06/03/16 0219  NA 138 138 135 136  K 3.1* 3.4* 3.8 3.6  CL 106 104 101 101  CO2 26 27 24 27   GLUCOSE 102* 138* 109* 144*  BUN 10 8 8 10   CREATININE 0.61 0.56 0.71 0.61  CALCIUM 7.9* 8.1* 8.6* 8.8*  PROT 5.2* 5.3*  --   --   ALBUMIN 2.5* 2.5*  --   --   AST 17 16  --   --   ALT 10* 10*  --   --   ALKPHOS 57 50  --   --   BILITOT 1.0 0.8  --   --   GFRNONAA >60 >60 >60 >60  GFRAA >60 >60 >60 >60  ANIONGAP 6 7 10 8      Hematology  Recent Labs Lab 06/01/16 0548 06/02/16 0220 06/03/16 0219  WBC 6.8 6.6 7.8  RBC 3.69* 3.82* 4.14  HGB 10.7* 10.9* 12.0  HCT 32.8*  33.7* 36.5  MCV 88.9 88.2 88.2  MCH 29.0 28.5 29.0  MCHC 32.6 32.3 32.9  RDW 13.2 13.6 13.4  PLT 142* 185 234    Cardiac Enzymes  Recent Labs Lab 05/30/16 1141 05/30/16 1815 05/31/16 0018 05/31/16 0556  TROPONINI <0.03 0.08* 0.18* 0.20*   No results for input(s): TROPIPOC in the last 168 hours.   BNPNo results for input(s): BNP, PROBNP in the last 168 hours.   DDimer No results for input(s): DDIMER in the last 168 hours.   Radiology    No results found.  Cardiac Studies   ECHO - normal EF  Patient Profile     71 y.o. female with right hip fracture while walking dog with elevated troponin and chest pain, with no PE, large left hiatal hernia and surgery postponed due to increased troponin.   Assessment & Plan    NSTEMI/ CAD mid LAD 90% with DES placement 1/22  - Plavix, ASA (increased bleeding risk during surgery but necessary to be on these meds with DES LAD placement on 06/01/16  - Crestor 10  - Coreg 12.5 BID  - Tele. PVC only  - Agree with surgery today, discussed with Dr. [name]. This allowed time for antiplatelets to reach steady state. Obviously with NSTEMI (albiet small) this increases her overall cardiac risk.   Right hip fx  - obviously she needs  her surgical correction of this. This is a must. Going today.   GERD, hiatal hernia   PNA  - noted on CT  - ABX  - NO PE.   Essential hypertension  - some BP measurements are normal. Likely driven in part by pain, hip. Monitor.   Signed, Candee Furbish, MD  06/03/2016, 10:26 AM

## 2016-06-03 NOTE — Progress Notes (Signed)
Patient arrived to 5N28 via bed accompanied by nurse tech. Pt alert and oriented. She does not appear to be in any acute distress. She was oriented to room, plan of care and orders reviewed. Will continue to assess

## 2016-06-03 NOTE — Progress Notes (Signed)
Had heart cath and PCI 2 days ago. Started on plavix, ASA. OK to proceed with surgery per cardiology. Plan for IM nail right femur today. Cont NPO.

## 2016-06-03 NOTE — Anesthesia Preprocedure Evaluation (Addendum)
Anesthesia Evaluation  Patient identified by MRN, date of birth, ID band Patient awake    Reviewed: Allergy & Precautions, H&P , NPO status , Patient's Chart, lab work & pertinent test results, reviewed documented beta blocker date and time   Airway Mallampati: II  TM Distance: >3 FB Neck ROM: Full    Dental no notable dental hx. (+) Teeth Intact, Dental Advisory Given   Pulmonary neg pulmonary ROS,    Pulmonary exam normal breath sounds clear to auscultation       Cardiovascular hypertension, Pt. on medications and Pt. on home beta blockers + CAD and + Cardiac Stents   Rhythm:Regular Rate:Normal     Neuro/Psych negative neurological ROS  negative psych ROS   GI/Hepatic Neg liver ROS, GERD  Medicated and Controlled,  Endo/Other  Hypothyroidism   Renal/GU negative Renal ROS  negative genitourinary   Musculoskeletal  (+) Arthritis , Osteoarthritis,    Abdominal   Peds  Hematology negative hematology ROS (+) anemia ,   Anesthesia Other Findings   Reproductive/Obstetrics negative OB ROS                            Anesthesia Physical Anesthesia Plan  ASA: III  Anesthesia Plan: General   Post-op Pain Management:    Induction: Intravenous  Airway Management Planned: Oral ETT  Additional Equipment:   Intra-op Plan:   Post-operative Plan: Extubation in OR  Informed Consent: I have reviewed the patients History and Physical, chart, labs and discussed the procedure including the risks, benefits and alternatives for the proposed anesthesia with the patient or authorized representative who has indicated his/her understanding and acceptance.   Dental advisory given  Plan Discussed with: CRNA  Anesthesia Plan Comments:         Anesthesia Quick Evaluation

## 2016-06-04 ENCOUNTER — Encounter (HOSPITAL_COMMUNITY): Payer: Self-pay | Admitting: Orthopedic Surgery

## 2016-06-04 DIAGNOSIS — S72141A Displaced intertrochanteric fracture of right femur, initial encounter for closed fracture: Secondary | ICD-10-CM

## 2016-06-04 DIAGNOSIS — D508 Other iron deficiency anemias: Secondary | ICD-10-CM

## 2016-06-04 LAB — BASIC METABOLIC PANEL
Anion gap: 11 (ref 5–15)
BUN: 18 mg/dL (ref 6–20)
CHLORIDE: 100 mmol/L — AB (ref 101–111)
CO2: 23 mmol/L (ref 22–32)
Calcium: 8.6 mg/dL — ABNORMAL LOW (ref 8.9–10.3)
Creatinine, Ser: 0.81 mg/dL (ref 0.44–1.00)
GFR calc Af Amer: >60 mL/min (ref >60)
GFR calc non Af Amer: >60 mL/min (ref >60)
GLUCOSE: 151 mg/dL — AB (ref 65–99)
POTASSIUM: 3.5 mmol/L (ref 3.5–5.1)
Sodium: 134 mmol/L — ABNORMAL LOW (ref 135–145)

## 2016-06-04 LAB — CBC
HEMATOCRIT: 33.1 % — AB (ref 36.0–46.0)
HEMOGLOBIN: 10.7 g/dL — AB (ref 12.0–15.0)
MCH: 28.4 pg (ref 26.0–34.0)
MCHC: 32.3 g/dL (ref 30.0–36.0)
MCV: 87.8 fL (ref 78.0–100.0)
PLATELETS: 235 10*3/uL (ref 150–400)
RBC: 3.77 MIL/uL — AB (ref 3.87–5.11)
RDW: 13.7 % (ref 11.5–15.5)
WBC: 11.9 10*3/uL — ABNORMAL HIGH (ref 4.0–10.5)

## 2016-06-04 MED ORDER — LEVOFLOXACIN 500 MG PO TABS
750.0000 mg | ORAL_TABLET | Freq: Every day | ORAL | Status: DC
  Administered 2016-06-05 – 2016-06-07 (×3): 750 mg via ORAL
  Filled 2016-06-04 (×3): qty 2

## 2016-06-04 MED ORDER — LOSARTAN POTASSIUM 25 MG PO TABS
25.0000 mg | ORAL_TABLET | Freq: Every day | ORAL | Status: DC
  Administered 2016-06-04 – 2016-06-07 (×4): 25 mg via ORAL
  Filled 2016-06-04 (×4): qty 1

## 2016-06-04 MED ORDER — ASPIRIN 81 MG PO CHEW
81.0000 mg | CHEWABLE_TABLET | Freq: Two times a day (BID) | ORAL | Status: DC
  Administered 2016-06-04 – 2016-06-07 (×7): 81 mg via ORAL
  Filled 2016-06-04 (×7): qty 1

## 2016-06-04 MED ORDER — HYDROCODONE-ACETAMINOPHEN 7.5-325 MG PO TABS
1.0000 | ORAL_TABLET | Freq: Four times a day (QID) | ORAL | Status: DC | PRN
  Administered 2016-06-04: 1 via ORAL
  Administered 2016-06-06 – 2016-06-07 (×2): 2 via ORAL
  Filled 2016-06-04: qty 2
  Filled 2016-06-04 (×4): qty 1
  Filled 2016-06-04: qty 2

## 2016-06-04 NOTE — Progress Notes (Signed)
Progress Note  Patient Name: Caitlyn [name] Date of Encounter: 06/04/2016  Primary Cardiologist: Dr. [name]  Subjective   Mild hip pain. No CP, no SOB.   Inpatient Medications    Scheduled Meds: . aspirin  81 mg Oral BID WC  . carvedilol  12.5 mg Oral BID WC  . clopidogrel  75 mg Oral Q breakfast  . feeding supplement (ENSURE ENLIVE)  237 mL Oral Q1500  . levofloxacin (LEVAQUIN) IV  750 mg Intravenous Q24H  . levothyroxine  88 mcg Oral QAC breakfast  . lipase/protease/amylase  36,000 Units Oral Daily  . mupirocin ointment   Nasal BID  . pantoprazole  40 mg Oral BID  . rosuvastatin  10 mg Oral QODAY  . vancomycin  1,000 mg Intravenous Q12H   Continuous Infusions:  PRN Meds: acetaminophen, albuterol, bisacodyl, hydrALAZINE, HYDROcodone-acetaminophen, HYDROmorphone (DILAUDID) injection, menthol-cetylpyridinium **OR** phenol, methocarbamol **OR** methocarbamol (ROBAXIN)  IV, metoCLOPramide **OR** metoCLOPramide (REGLAN) injection, naLOXone (NARCAN)  injection, naLOXone (NARCAN)  injection, ondansetron (ZOFRAN) IV, polyethylene glycol   Vital Signs    Vitals:   06/03/16 2022 06/03/16 2053 06/04/16 0100 06/04/16 0440  BP: (!) 159/84 (!) 144/80 (!) 169/70 (!) 155/63  Pulse: 87 83 86 98  Resp: 14 15 18 14   Temp: 97.6 F (36.4 C) 98.5 F (36.9 C) 99.5 F (37.5 C) 98.9 F (37.2 C)  TempSrc:  Oral Oral Oral  SpO2: 98% 91% 95% 91%  Weight:      Height:        Intake/Output Summary (Last 24 hours) at 06/04/16 0941 Last data filed at 06/04/16 0600  Gross per 24 hour  Intake             1665 ml  Output              725 ml  Net              940 ml   Filed Weights   05/28/16 1933 06/02/16 0400  Weight: 175 lb (79.4 kg) 174 lb 2.6 oz (79 kg)    Telemetry    NSR - Personally Reviewed   ECG    No st changes - Personally Reviewed  Physical Exam   GEN: No acute distress.  Neck: No JVD Cardiac: RRR, no murmurs, rubs, or gallops.  Respiratory: Clear to auscultation  bilaterally. GI: Soft, nontender, non-distended  MS: No edema; Right leg deformity. Neuro:  AAOx3. Psych: Normal affect  Labs    Chemistry  Recent Labs Lab 05/31/16 0556 06/01/16 0548 06/02/16 0220 06/03/16 0219 06/04/16 0516  NA 138 138 135 136 134*  K 3.1* 3.4* 3.8 3.6 3.5  CL 106 104 101 101 100*  CO2 26 27 24 27 23   GLUCOSE 102* 138* 109* 144* 151*  BUN 10 8 8 10 18   CREATININE 0.61 0.56 0.71 0.61 0.81  CALCIUM 7.9* 8.1* 8.6* 8.8* 8.6*  PROT 5.2* 5.3*  --   --   --   ALBUMIN 2.5* 2.5*  --   --   --   AST 17 16  --   --   --   ALT 10* 10*  --   --   --   ALKPHOS 57 50  --   --   --   BILITOT 1.0 0.8  --   --   --   GFRNONAA >60 >60 >60 >60 >60  GFRAA >60 >60 >60 >60 >60  ANIONGAP 6 7 10 8 11      Hematology  Recent  Labs Lab 06/02/16 0220 06/03/16 0219 06/04/16 0516  WBC 6.6 7.8 11.9*  RBC 3.82* 4.14 3.77*  HGB 10.9* 12.0 10.7*  HCT 33.7* 36.5 33.1*  MCV 88.2 88.2 87.8  MCH 28.5 29.0 28.4  MCHC 32.3 32.9 32.3  RDW 13.6 13.4 13.7  PLT 185 234 235    Cardiac Enzymes  Recent Labs Lab 05/30/16 1141 05/30/16 1815 05/31/16 0018 05/31/16 0556  TROPONINI <0.03 0.08* 0.18* 0.20*   No results for input(s): TROPIPOC in the last 168 hours.   BNPNo results for input(s): BNP, PROBNP in the last 168 hours.   DDimer No results for input(s): DDIMER in the last 168 hours.   Radiology    Dg C-arm 1-60 Min  Result Date: 06/03/2016 CLINICAL DATA:  Right femoral intramedullary nail placement. Initial encounter. EXAM: OPERATIVE RIGHT HIP WITH PELVIS COMPARISON:  Right hip radiographs performed 05/28/2016 FINDINGS: Two fluoroscopic C-arm images are provided from the OR. These demonstrate placement of a right-sided dynamic hip screw, transfixing the patient's intertrochanteric fracture in grossly anatomic alignment. No new fractures are seen. The right femoral head remains seated at the acetabulum. IMPRESSION: Status post internal fixation of right femoral fracture in  grossly anatomic alignment. Electronically Signed   By: Garald Balding M.D.   On: 06/03/2016 21:03   Dg Hip Port Unilat With Pelvis 1v Right  Result Date: 06/03/2016 CLINICAL DATA:  Status post placement of right femoral intramedullary nail. Initial encounter. EXAM: DG HIP (WITH OR WITHOUT PELVIS) 1V PORT RIGHT COMPARISON:  Right hip radiographs performed 05/28/2016 FINDINGS: The patient is status post placement of an intramedullary nail and hip screw at the proximal right femur, transfixing the patient's right femoral fracture in grossly anatomic alignment. No new fractures are seen. The right femoral head remains seated at the acetabulum. Overlying postoperative soft tissue air is seen. IMPRESSION: Status post internal fixation of right femoral fracture in grossly anatomic alignment Electronically Signed   By: Garald Balding M.D.   On: 06/03/2016 20:26   Dg Hip Operative Unilat W Or W/o Pelvis Right  Result Date: 06/03/2016 CLINICAL DATA:  Right femoral intramedullary nail placement. Initial encounter. EXAM: OPERATIVE RIGHT HIP WITH PELVIS COMPARISON:  Right hip radiographs performed 05/28/2016 FINDINGS: Two fluoroscopic C-arm images are provided from the OR. These demonstrate placement of a right-sided dynamic hip screw, transfixing the patient's intertrochanteric fracture in grossly anatomic alignment. No new fractures are seen. The right femoral head remains seated at the acetabulum. IMPRESSION: Status post internal fixation of right femoral fracture in grossly anatomic alignment. Electronically Signed   By: Garald Balding M.D.   On: 06/03/2016 21:03    Cardiac Studies   ECHO - normal EF  Patient Profile     71 y.o. female with right hip fracture while walking dog with elevated troponin and chest pain, with no PE, large left hiatal hernia and surgery postponed due to increased troponin.   Assessment & Plan    NSTEMI/ CAD mid LAD 90% with DES placement 1/22  - Plavix, ASA  DES LAD placement  on 06/01/16  - Crestor 10  - Coreg 12.5 BID, adding losartan 25 QD  - Tele. PVC only  - cardiac rehab when able.   Right hip fx  - Intramedullary fixation, Right femur on 06/03/16  GERD, hiatal hernia   PNA  - noted on CT  - ABX  - NO PE.   Essential hypertension  - some BP measurements are normal. Likely driven in part by pain, hip. Monitor.   -  adding losartan 25  - discussed case with Dr. Grandville Silos. OK with DC from cardiac perspective.   Signed, Candee Furbish, MD  06/04/2016, 9:41 AM

## 2016-06-04 NOTE — Care Management Important Message (Signed)
Important Message  Patient Details  Name: Caitlyn [name] MRN: [id number] Date of Birth: [birth year]   Medicare Important Message Given:  Yes    Nathen May 06/04/2016, 11:34 AM

## 2016-06-04 NOTE — Progress Notes (Signed)
Came to do CAD ed with pt however she is sleeping soundly. She has not walked yet. Will f/u later. Yves Dill CES, ACSM 3:15 PM 06/04/2016

## 2016-06-04 NOTE — Progress Notes (Signed)
PROGRESS NOTE    Caitlyn [name]  [id number] DOB: 06-02-1945 DOA: 05/28/2016 PCP: Henrine Screws, MD    Brief Narrative:  Caitlyn [name] a 71 y.o.femalewith a past medical history significant for hypothyroidism, HTN and iron def anemiawho presents with hip pain after a fall. The patient was in hernormal state of health until this evening when she was walking her dog, slipped on the ice, fell on her right hipand had immediate RIGHThip pain and could not walk.  While she was in the hospital developed chest pain and pneumonia, currently on antibiotics and chest pain seen by cardiology cardiac catheterization was done and a stent placed on the mid LAD on 1/22.    Assessment & Plan:   Principal Problem:   Closed fracture of right hip (HCC) Active Problems:   Iron deficiency anemia   Essential hypertension   Hypothyroidism, acquired   Chest pain   Status post coronary artery stent placement   NSTEMI (non-ST elevated myocardial infarction) ([place])   Displaced intertrochanteric fracture of right femur, initial encounter for closed fracture ([place])  Non-STEMI /coronary artery disease mid LAD 90% stenosis with drug-eluting stent placement 06/01/2016 per cardiac catheterization. Patient would recent cardiac catheterization after complaints of chest pain and slightly elevated troponin. Patient status post drug-eluting stent to the mid LAD. Patient has been seen by cardiology during the hospitalization and recommend that patient's aspirin and Plavix be continued during surgery due to recent stent placement on 06/01/2016. Echo 1/20=> EF 60% Patient had chest pain with troponin peak of 0.2. Started on IV heparin, beta blockers statin and aspirin, cardiac catheterization done on 1/22. Continue other cardiac medications of Crestor, Coreg. ARB started per cardiology for better blood pressure control and for cardiac etiology. Patient status post surgery. Outpatient follow-up with Dr.  [name].  Non displaced intertrochanteric fracture on the right.  Patient status post IM fixation right femur 06/03/2016 per Dr. Lyla Glassing. Weightbearing as tolerated with walker. Awaiting PT OT evaluation. Per orthopedics.  HCAP Was on vancomycin, cefepime and levofloxacin. Cefepime discontinued, currently on vancomycin and levofloxacin. We'll narrow antibiotics by discontinuing vancomycin and change Levaquin to oral Levaquin to complete cause of antibiotic treatment. Continue supportive management with bronchodilators and mucolytics.  Hypertension. Continue BB and statin. HoldingACEi pre-operatively. Patient was restarted on the ARB per cardiology.  Iron deficiency anemia. Hgb -WNL. Ferritin 90s in July. Is intolerant to oral iron, gets iron infusions with Dr. Beryle Beams. H&H stable. Follow postoperatively.  Hypothyroidism. Continue levothyroxine  H/o asthma.No acute exacerbations. Exam is unremarkable. Continue scheduled nebulizers.  Pancreatic insufficiency Pt currently nothing by mouth in preparation for surgery this afternoon and a such Creon on hold. Postoperatively will resume Creon once patient started on a diet.    DVT prophylaxis: SCDs. Per orthopedics. Code Status: Full Family Communication: Updated patient and family at bedside. Disposition Plan: Pending repair of right intertrochanteric fracture and per orthopedics and cardiology and PT evaluation.   Consultants:   Orthopedics: Dr.Swinteck 05/29/2016  Cardiology: Dr.Skains 05/30/2016  Procedures:   CT angiogram chest 05/30/2016  Chest x-ray 05/28/2016, 05/30/2016  2-D echo 05/30/2016  Cardiac catheterization 06/01/2016. Dr. [name]--- proximal RCA 20% stenosed, mid LAD lesion 90% stenosed. Postintervention there is a 0% residual stenosis. A drug-eluting stent was successfully placed  Plain films of the right hip and pelvis 05/28/2016  IM fixation right femur per Dr. Lyla Glassing  06/03/2016  Antimicrobials:   IV cefepime 05/30/2016>>>> 06/01/2016  IV Levaquin 05/30/2016>>>> oral Levaquin 06/04/2016  IV vancomycin 05/30/2016>>>>>>06/04/2016   Subjective: Patient denies  any chest pain. Patient denies CP or SOB.  Objective: Vitals:   06/03/16 2022 06/03/16 2053 06/04/16 0100 06/04/16 0440  BP: (!) 159/84 (!) 144/80 (!) 169/70 (!) 155/63  Pulse: 87 83 86 98  Resp: 14 15 18 14   Temp: 97.6 F (36.4 C) 98.5 F (36.9 C) 99.5 F (37.5 C) 98.9 F (37.2 C)  TempSrc:  Oral Oral Oral  SpO2: 98% 91% 95% 91%  Weight:      Height:        Intake/Output Summary (Last 24 hours) at 06/04/16 1208 Last data filed at 06/04/16 0600  Gross per 24 hour  Intake             1665 ml  Output              725 ml  Net              940 ml   Filed Weights   05/28/16 1933 06/02/16 0400  Weight: 79.4 kg (175 lb) 79 kg (174 lb 2.6 oz)    Examination:  General exam: Appears calm and comfortable  Respiratory system: Clear to auscultation. Respiratory effort normal. Cardiovascular system: S1 & S2 heard, RRR. No JVD, murmurs, rubs, gallops or clicks. No pedal edema. Gastrointestinal system: Abdomen is nondistended, soft and nontender. No organomegaly or masses felt. Normal bowel sounds heard. Central nervous system: Alert and oriented. No focal neurological deficits. Extremities: Symmetric 5 x 5 power. Skin: No rashes, lesions or ulcers Psychiatry: Judgement and insight appear normal. Mood & affect appropriate.     Data Reviewed: I have personally reviewed following labs and imaging studies  CBC:  Recent Labs Lab 05/28/16 1954  05/31/16 0556 06/01/16 0548 06/02/16 0220 06/03/16 0219 06/04/16 0516  WBC 14.1*  < > 8.9 6.8 6.6 7.8 11.9*  NEUTROABS 11.5*  --   --   --   --   --   --   HGB 13.8  < > 11.2* 10.7* 10.9* 12.0 10.7*  HCT 41.9  < > 35.0* 32.8* 33.7* 36.5 33.1*  MCV 88.6  < > 90.4 88.9 88.2 88.2 87.8  PLT 203  < > 129* 142* 185 234 235  < > = values in  this interval not displayed. Basic Metabolic Panel:  Recent Labs Lab 05/31/16 0556 06/01/16 0548 06/02/16 0220 06/03/16 0219 06/04/16 0516  NA 138 138 135 136 134*  K 3.1* 3.4* 3.8 3.6 3.5  CL 106 104 101 101 100*  CO2 26 27 24 27 23   GLUCOSE 102* 138* 109* 144* 151*  BUN 10 8 8 10 18   CREATININE 0.61 0.56 0.71 0.61 0.81  CALCIUM 7.9* 8.1* 8.6* 8.8* 8.6*   GFR: Estimated Creatinine Clearance: 66.6 mL/min (by C-G formula based on SCr of 0.81 mg/dL). Liver Function Tests:  Recent Labs Lab 05/31/16 0556 06/01/16 0548  AST 17 16  ALT 10* 10*  ALKPHOS 57 50  BILITOT 1.0 0.8  PROT 5.2* 5.3*  ALBUMIN 2.5* 2.5*   No results for input(s): LIPASE, AMYLASE in the last 168 hours. No results for input(s): AMMONIA in the last 168 hours. Coagulation Profile:  Recent Labs Lab 05/28/16 1954  INR 1.07   Cardiac Enzymes:  Recent Labs Lab 05/30/16 0604 05/30/16 1141 05/30/16 1815 05/31/16 0018 05/31/16 0556  TROPONINI 0.03* <0.03 0.08* 0.18* 0.20*   BNP (last 3 results) No results for input(s): PROBNP in the last 8760 hours. HbA1C: No results for input(s): HGBA1C in the last 72 hours. CBG: No results  for input(s): GLUCAP in the last 168 hours. Lipid Profile: No results for input(s): CHOL, HDL, LDLCALC, TRIG, CHOLHDL, LDLDIRECT in the last 72 hours. Thyroid Function Tests: No results for input(s): TSH, T4TOTAL, FREET4, T3FREE, THYROIDAB in the last 72 hours. Anemia Panel: No results for input(s): VITAMINB12, FOLATE, FERRITIN, TIBC, IRON, RETICCTPCT in the last 72 hours. Sepsis Labs: No results for input(s): PROCALCITON, LATICACIDVEN in the last 168 hours.  Recent Results (from the past 240 hour(s))  Surgical pcr screen     Status: Abnormal   Collection Time: 05/29/16  2:09 AM  Result Value Ref Range Status   MRSA, PCR NEGATIVE NEGATIVE Final   Staphylococcus aureus POSITIVE (A) NEGATIVE Final    Comment:        The Xpert SA Assay (FDA approved for NASAL  specimens in patients over 38 years of age), is one component of a comprehensive surveillance program.  Test performance has been validated by [facility] for patients greater than or equal to 37 year old. It is not intended to diagnose infection nor to guide or monitor treatment.          Radiology Studies: Dg C-arm 1-60 Min  Result Date: 06/03/2016 CLINICAL DATA:  Right femoral intramedullary nail placement. Initial encounter. EXAM: OPERATIVE RIGHT HIP WITH PELVIS COMPARISON:  Right hip radiographs performed 05/28/2016 FINDINGS: Two fluoroscopic C-arm images are provided from the OR. These demonstrate placement of a right-sided dynamic hip screw, transfixing the patient's intertrochanteric fracture in grossly anatomic alignment. No new fractures are seen. The right femoral head remains seated at the acetabulum. IMPRESSION: Status post internal fixation of right femoral fracture in grossly anatomic alignment. Electronically Signed   By: Garald Balding M.D.   On: 06/03/2016 21:03   Dg Hip Port Unilat With Pelvis 1v Right  Result Date: 06/03/2016 CLINICAL DATA:  Status post placement of right femoral intramedullary nail. Initial encounter. EXAM: DG HIP (WITH OR WITHOUT PELVIS) 1V PORT RIGHT COMPARISON:  Right hip radiographs performed 05/28/2016 FINDINGS: The patient is status post placement of an intramedullary nail and hip screw at the proximal right femur, transfixing the patient's right femoral fracture in grossly anatomic alignment. No new fractures are seen. The right femoral head remains seated at the acetabulum. Overlying postoperative soft tissue air is seen. IMPRESSION: Status post internal fixation of right femoral fracture in grossly anatomic alignment Electronically Signed   By: Garald Balding M.D.   On: 06/03/2016 20:26   Dg Hip Operative Unilat W Or W/o Pelvis Right  Result Date: 06/03/2016 CLINICAL DATA:  Right femoral intramedullary nail placement. Initial encounter. EXAM:  OPERATIVE RIGHT HIP WITH PELVIS COMPARISON:  Right hip radiographs performed 05/28/2016 FINDINGS: Two fluoroscopic C-arm images are provided from the OR. These demonstrate placement of a right-sided dynamic hip screw, transfixing the patient's intertrochanteric fracture in grossly anatomic alignment. No new fractures are seen. The right femoral head remains seated at the acetabulum. IMPRESSION: Status post internal fixation of right femoral fracture in grossly anatomic alignment. Electronically Signed   By: Garald Balding M.D.   On: 06/03/2016 21:03        Scheduled Meds: . aspirin  81 mg Oral BID WC  . carvedilol  12.5 mg Oral BID WC  . clopidogrel  75 mg Oral Q breakfast  . feeding supplement (ENSURE ENLIVE)  237 mL Oral Q1500  . levofloxacin  750 mg Oral Daily  . levothyroxine  88 mcg Oral QAC breakfast  . lipase/protease/amylase  36,000 Units Oral Daily  .  losartan  25 mg Oral Daily  . mupirocin ointment   Nasal BID  . pantoprazole  40 mg Oral BID  . rosuvastatin  10 mg Oral QODAY   Continuous Infusions:   LOS: 7 days    Time spent: 14 mins    Danyal Whitenack, MD [facility] Pager [phone]  If 7PM-7AM, please contact night-coverage [email or web address] Password [facility] 06/04/2016, 12:08 PM

## 2016-06-04 NOTE — Evaluation (Signed)
Physical Therapy Evaluation Patient Details Name: Caitlyn [name] MRN: [id number] DOB: [birth year] Today's Date: 06/04/2016   History of Present Illness  71 yo female admitted on 05/28/16 following a fall on the ice resulting in right hip fx. During her hospitalization, pt had a cardiac event x2 requiring stent placement prior to surgical intervention. IM nail was performed on 06/03/16. PMH significant for anemia, OA, CAD, GERD, HTN, iron deficiency.   Clinical Impression  Pt is POD 1 and moving well with therapy despite multiple days of bed rest due to medical complications. Pt is able to perform bed mobs, transfers and gait with Min A this session. Pt is mildly lethargic possibly due to medications which limited her ability to perform increased distance with gait. Pt is considering SNF placement upon discharge as she will return home with only intermittent help from her friends. This would be the pt's safest option at this time. Prior to admission, pt was completely independent and driving herself. Pt will benefit from continuing to be seen acutely in order to address below deficits before discharge to next venue of care.     Follow Up Recommendations SNF;Supervision/Assistance - 24 hour    Equipment Recommendations  Rolling walker with 5" wheels;3in1 (PT)    Recommendations for Other Services       Precautions / Restrictions Precautions Precautions: Fall Restrictions Weight Bearing Restrictions: Yes RUE Weight Bearing: Partial weight bearing RUE Partial Weight Bearing Percentage or Pounds: none listed, PWB per RN RLE Weight Bearing: Weight bearing as tolerated      Mobility  Bed Mobility Overal bed mobility: Needs Assistance Bed Mobility: Supine to Sit     Supine to sit: Min assist     General bed mobility comments: Min A to bring RLE EOB and to assit bringing hips anteriorly to EOB with use of Pad  Transfers Overall transfer level: Needs assistance Equipment used: Rolling  walker (2 wheeled) Transfers: Sit to/from Stand Sit to Stand: Min assist         General transfer comment: Min A at trunk to stabilize.   Ambulation/Gait Ambulation/Gait assistance: Min assist Ambulation Distance (Feet): 5 Feet Assistive device: Rolling walker (2 wheeled) Gait Pattern/deviations: Step-to pattern;Decreased step length - left;Decreased stance time - right;Antalgic Gait velocity: decreasd Gait velocity interpretation: Below normal speed for age/gender General Gait Details: Moderate antalgic gait, increased c/o pain with gait. Step to sequencing with cues for positioning within walker  Stairs            Wheelchair Mobility    Modified Rankin (Stroke Patients Only)       Balance                                             Pertinent Vitals/Pain Pain Assessment: 0-10 Pain Score: 4  Pain Location: right anterior Leg and into knee Pain Descriptors / Indicators: Aching;Grimacing;Guarding Pain Intervention(s): Limited activity within patient's tolerance;Monitored during session;Premedicated before session;Repositioned;Ice applied    Home Living Family/patient expects to be discharged to:: Private residence Living Arrangements: Alone Available Help at Discharge: Friend(s);Available PRN/intermittently Type of Home: House Home Access: Level entry     Home Layout: Two level;Able to live on main level with bedroom/bathroom Home Equipment: None Additional Comments: May have friends to assist PRN    Prior Function Level of Independence: Independent         Comments: was completely  independent walking dog when she fell. Was driving and working at [employer]. No liimtations     Hand Dominance   Dominant Hand: Right    Extremity/Trunk Assessment   Upper Extremity Assessment Upper Extremity Assessment: Defer to OT evaluation    Lower Extremity Assessment Lower Extremity Assessment: RLE deficits/detail RLE Deficits /  Details: pt with normal post op pain and weakness. At least 3/5 ankle and 2/5 knee and hip per gross functional assessment       Communication   Communication: No difficulties  Cognition Arousal/Alertness: Awake/alert Behavior During Therapy: WFL for tasks assessed/performed Overall Cognitive Status: Within Functional Limits for tasks assessed                 General Comments: pt is minimally lethargic due to medications    General Comments      Exercises     Assessment/Plan    PT Assessment Patient needs continued PT services  PT Problem List Decreased strength;Decreased range of motion;Decreased activity tolerance;Decreased balance;Decreased mobility;Decreased knowledge of use of DME;Pain          PT Treatment Interventions DME instruction;Gait training;Stair training;Functional mobility training;Therapeutic activities;Therapeutic exercise;Balance training;Patient/family education    PT Goals (Current goals can be found in the Care Plan section)  Acute Rehab PT Goals Patient Stated Goal: to get back to walking PT Goal Formulation: With patient Time For Goal Achievement: 06/11/16 Potential to Achieve Goals: Good    Frequency Min 3X/week   Barriers to discharge        Co-evaluation               End of Session Equipment Utilized During Treatment: Gait belt Activity Tolerance: Patient tolerated treatment well;Patient limited by pain Patient left: in chair;with call bell/phone within reach;with family/visitor present Nurse Communication: Mobility status         Time: [id number] PT Time Calculation (min) (ACUTE ONLY): 32 min   Charges:   PT Evaluation $PT Eval Moderate Complexity: 1 Procedure PT Treatments $Therapeutic Activity: 8-22 mins   PT G Codes:        Scheryl Marten PT, DPT  [phone]  06/04/2016, 5:25 PM

## 2016-06-04 NOTE — Progress Notes (Signed)
   Subjective:  Patient reports pain as mild to moderate.  No c/o.  Objective:   VITALS:   Vitals:   06/03/16 2022 06/03/16 2053 06/04/16 0100 06/04/16 0440  BP: (!) 159/84 (!) 144/80 (!) 169/70 (!) 155/63  Pulse: 87 83 86 98  Resp: 14 15 18 14   Temp: 97.6 F (36.4 C) 98.5 F (36.9 C) 99.5 F (37.5 C) 98.9 F (37.2 C)  TempSrc:  Oral Oral Oral  SpO2: 98% 91% 95% 91%  Weight:      Height:        NAD ABD soft Sensation intact distally Intact pulses distally Dorsiflexion/Plantar flexion intact Incision: dressing C/D/I Compartment soft   Lab Results  Component Value Date   WBC 11.9 (H) 06/04/2016   HGB 10.7 (L) 06/04/2016   HCT 33.1 (L) 06/04/2016   MCV 87.8 06/04/2016   PLT 235 06/04/2016   BMET    Component Value Date/Time   NA 134 (L) 06/04/2016 0516   NA 139 08/26/2015 1439   K 3.5 06/04/2016 0516   CL 100 (L) 06/04/2016 0516   CO2 23 06/04/2016 0516   GLUCOSE 151 (H) 06/04/2016 0516   BUN 18 06/04/2016 0516   BUN 15 08/26/2015 1439   CREATININE 0.81 06/04/2016 0516   CREATININE 0.65 09/18/2014 1042   CALCIUM 8.6 (L) 06/04/2016 0516   GFRNONAA >60 06/04/2016 0516   GFRNONAA 85 05/15/2014 1053   GFRAA >60 06/04/2016 0516   GFRAA >89 05/15/2014 1053     Assessment/Plan: 1 Day Post-Op   Principal Problem:   Closed fracture of right hip (HCC) Active Problems:   Iron deficiency anemia   Essential hypertension   Hypothyroidism, acquired   Chest pain   Status post coronary artery stent placement   NSTEMI (non-ST elevated myocardial infarction) ([place])   Displaced intertrochanteric fracture of right femur, initial encounter for closed fracture ([place])   WBAT with walker DVT ppx: ASA, plavix, SCDs, TEDs PO pain control PT/OT Dispo: D/C home with HHPT if mobilizes well    Tamea Bai, Horald Pollen 06/04/2016, 7:37 AM   Rod Can, MD Cell [phone]

## 2016-06-05 ENCOUNTER — Inpatient Hospital Stay (HOSPITAL_COMMUNITY): Payer: Medicare Other

## 2016-06-05 DIAGNOSIS — Z09 Encounter for follow-up examination after completed treatment for conditions other than malignant neoplasm: Secondary | ICD-10-CM

## 2016-06-05 LAB — MAGNESIUM: MAGNESIUM: 2.3 mg/dL (ref 1.7–2.4)

## 2016-06-05 LAB — BASIC METABOLIC PANEL
ANION GAP: 11 (ref 5–15)
BUN: 18 mg/dL (ref 6–20)
CO2: 26 mmol/L (ref 22–32)
CREATININE: 0.75 mg/dL (ref 0.44–1.00)
Calcium: 8.7 mg/dL — ABNORMAL LOW (ref 8.9–10.3)
Chloride: 98 mmol/L — ABNORMAL LOW (ref 101–111)
Glucose, Bld: 143 mg/dL — ABNORMAL HIGH (ref 65–99)
Potassium: 3.2 mmol/L — ABNORMAL LOW (ref 3.5–5.1)
SODIUM: 135 mmol/L (ref 135–145)

## 2016-06-05 LAB — CBC
HCT: 32.7 % — ABNORMAL LOW (ref 36.0–46.0)
Hemoglobin: 10.6 g/dL — ABNORMAL LOW (ref 12.0–15.0)
MCH: 28.7 pg (ref 26.0–34.0)
MCHC: 32.4 g/dL (ref 30.0–36.0)
MCV: 88.6 fL (ref 78.0–100.0)
PLATELETS: 241 10*3/uL (ref 150–400)
RBC: 3.69 MIL/uL — ABNORMAL LOW (ref 3.87–5.11)
RDW: 14.2 % (ref 11.5–15.5)
WBC: 11.8 10*3/uL — AB (ref 4.0–10.5)

## 2016-06-05 MED ORDER — BOOST / RESOURCE BREEZE PO LIQD
1.0000 | Freq: Two times a day (BID) | ORAL | Status: DC
  Administered 2016-06-05 – 2016-06-07 (×4): 1 via ORAL

## 2016-06-05 MED ORDER — POTASSIUM CHLORIDE CRYS ER 20 MEQ PO TBCR
40.0000 meq | EXTENDED_RELEASE_TABLET | ORAL | Status: AC
  Administered 2016-06-05 (×2): 40 meq via ORAL
  Filled 2016-06-05 (×2): qty 2

## 2016-06-05 MED ORDER — PRO-STAT SUGAR FREE PO LIQD
30.0000 mL | Freq: Two times a day (BID) | ORAL | Status: DC
  Administered 2016-06-05 – 2016-06-07 (×4): 30 mL via ORAL
  Filled 2016-06-05 (×4): qty 30

## 2016-06-05 MED ORDER — TRAMADOL HCL 50 MG PO TABS
100.0000 mg | ORAL_TABLET | Freq: Four times a day (QID) | ORAL | Status: DC | PRN
  Administered 2016-06-05 – 2016-06-06 (×3): 100 mg via ORAL
  Filled 2016-06-05 (×4): qty 2

## 2016-06-05 NOTE — Progress Notes (Signed)
2nd page to Dr. Tana Coast regarding pt c/o not feeling well.

## 2016-06-05 NOTE — Progress Notes (Signed)
Nutrition Follow-up  DOCUMENTATION CODES:   Not applicable  INTERVENTION:  Discontinue Ensure.  Provide Boost Breeze po BID, each supplement provides 250 kcal and 9 grams of protein.  Provide 30 ml Prostat po BID, each supplement provides 100 kcal and 15 grams of protein.   Encourage adequate PO intake.   NUTRITION DIAGNOSIS:   Increased nutrient needs related to  (surgery) as evidenced by estimated needs; ongoing  GOAL:   Patient will meet greater than or equal to 90% of their needs; progressing  MONITOR:   PO intake, Supplement acceptance, Labs, Weight trends, Skin, I & O's  REASON FOR ASSESSMENT:   Consult Hip fracture protocol  ASSESSMENT:   71 y.o. female with PMH of Asthma, hypothyroidism, HTN and iron def anemia who presents with hip pain after a fall. Mechanical fall. Non displaced intertrochanteric fracture on the right.   PROCEDURE (1/24): Intramedullary fixation, Right femur  Pt reports having a lack of appetite. Meal completion has been varied from 25-75% with 25% at breakfast this AM. Pt currently has Ensure ordered and has been consuming them, however reports disliking the taste and would like to try alternative supplements. Pt agreeable to [employer]. RD to order. RD to additionally order Prostat to aid in protein needs.   Labs and medications reviewed.   Diet Order:  Diet Heart Room service appropriate? Yes; Fluid consistency: Thin  Skin:   (Incision on R hip)  Last BM:  1/25  Height:   Ht Readings from Last 1 Encounters:  05/28/16 5\' 6"  (1.676 m)    Weight:   Wt Readings from Last 1 Encounters:  06/02/16 174 lb 2.6 oz (79 kg)    Ideal Body Weight:  59 kg  BMI:  Body mass index is 28.11 kg/m.  Estimated Nutritional Needs:   Kcal:  1800-2000  Protein:  75-90 grams  Fluid:  1.8 - 2 L/day  EDUCATION NEEDS:   No education needs identified at this time  Corrin Parker, MS, RD, LDN Pager # [phone] After hours/ weekend pager  # [phone]

## 2016-06-05 NOTE — Progress Notes (Signed)
Progress Note  Patient Name: Caitlyn [name] Date of Encounter: 06/05/2016  Primary Cardiologist: Dr. [name]  Subjective   Mild hip pain. Had some chest discomfort that radiated down left arm. Dr. Grandville Silos discussed with me. Tender under left breast to palpation, reduplicating pain. She is quite emotional now. No further pain.   Inpatient Medications    Scheduled Meds: . aspirin  81 mg Oral BID WC  . carvedilol  12.5 mg Oral BID WC  . clopidogrel  75 mg Oral Q breakfast  . feeding supplement (ENSURE ENLIVE)  237 mL Oral Q1500  . levofloxacin  750 mg Oral Daily  . levothyroxine  88 mcg Oral QAC breakfast  . lipase/protease/amylase  36,000 Units Oral Daily  . losartan  25 mg Oral Daily  . mupirocin ointment   Nasal BID  . pantoprazole  40 mg Oral BID  . potassium chloride  40 mEq Oral Q4H  . rosuvastatin  10 mg Oral QODAY   Continuous Infusions:  PRN Meds: acetaminophen, albuterol, bisacodyl, hydrALAZINE, HYDROcodone-acetaminophen, HYDROmorphone (DILAUDID) injection, menthol-cetylpyridinium **OR** phenol, methocarbamol **OR** methocarbamol (ROBAXIN)  IV, metoCLOPramide **OR** metoCLOPramide (REGLAN) injection, naLOXone (NARCAN)  injection, naLOXone (NARCAN)  injection, ondansetron (ZOFRAN) IV, polyethylene glycol, traMADol   Vital Signs    Vitals:   06/04/16 2100 06/05/16 0620 06/05/16 1043 06/05/16 1125  BP: 118/76 (!) 137/56 (!) 152/63 140/80  Pulse: 66 83 89 87  Resp: 16 16 16 16   Temp: 98.9 F (37.2 C) 98.9 F (37.2 C) 99 F (37.2 C)   TempSrc: Oral Oral Oral   SpO2: 96% 94% 95% 97%  Weight:      Height:        Intake/Output Summary (Last 24 hours) at 06/05/16 1127 Last data filed at 06/05/16 1100  Gross per 24 hour  Intake              720 ml  Output                0 ml  Net              720 ml   Filed Weights   05/28/16 1933 06/02/16 0400  Weight: 175 lb (79.4 kg) 174 lb 2.6 oz (79 kg)    Telemetry    NSR - Personally Reviewed   ECG    06/05/16 -  No st changes, NSR - Personally Reviewed  Physical Exam   GEN: No acute distress.  Neck: No JVD Cardiac: RRR, no murmurs, rubs, or gallops.  Respiratory: Clear to auscultation bilaterally. GI: Soft, nontender, non-distended  MS: No edema; Right leg deformity. Neuro:  AAOx3. Psych: Normal affect  Labs    Chemistry  Recent Labs Lab 05/31/16 0556 06/01/16 0548  06/03/16 0219 06/04/16 0516 06/05/16 0728  NA 138 138  < > 136 134* 135  K 3.1* 3.4*  < > 3.6 3.5 3.2*  CL 106 104  < > 101 100* 98*  CO2 26 27  < > 27 23 26   GLUCOSE 102* 138*  < > 144* 151* 143*  BUN 10 8  < > 10 18 18   CREATININE 0.61 0.56  < > 0.61 0.81 0.75  CALCIUM 7.9* 8.1*  < > 8.8* 8.6* 8.7*  PROT 5.2* 5.3*  --   --   --   --   ALBUMIN 2.5* 2.5*  --   --   --   --   AST 17 16  --   --   --   --  ALT 10* 10*  --   --   --   --   ALKPHOS 57 50  --   --   --   --   BILITOT 1.0 0.8  --   --   --   --   GFRNONAA >60 >60  < > >60 >60 >60  GFRAA >60 >60  < > >60 >60 >60  ANIONGAP 6 7  < > 8 11 11   < > = values in this interval not displayed.   Hematology  Recent Labs Lab 06/03/16 0219 06/04/16 0516 06/05/16 0728  WBC 7.8 11.9* 11.8*  RBC 4.14 3.77* 3.69*  HGB 12.0 10.7* 10.6*  HCT 36.5 33.1* 32.7*  MCV 88.2 87.8 88.6  MCH 29.0 28.4 28.7  MCHC 32.9 32.3 32.4  RDW 13.4 13.7 14.2  PLT 234 235 241    Cardiac Enzymes  Recent Labs Lab 05/30/16 1141 05/30/16 1815 05/31/16 0018 05/31/16 0556  TROPONINI <0.03 0.08* 0.18* 0.20*   No results for input(s): TROPIPOC in the last 168 hours.   BNPNo results for input(s): BNP, PROBNP in the last 168 hours.   DDimer No results for input(s): DDIMER in the last 168 hours.   Radiology    Dg C-arm 1-60 Min  Result Date: 06/03/2016 CLINICAL DATA:  Right femoral intramedullary nail placement. Initial encounter. EXAM: OPERATIVE RIGHT HIP WITH PELVIS COMPARISON:  Right hip radiographs performed 05/28/2016 FINDINGS: Two fluoroscopic C-arm images are  provided from the OR. These demonstrate placement of a right-sided dynamic hip screw, transfixing the patient's intertrochanteric fracture in grossly anatomic alignment. No new fractures are seen. The right femoral head remains seated at the acetabulum. IMPRESSION: Status post internal fixation of right femoral fracture in grossly anatomic alignment. Electronically Signed   By: Garald Balding M.D.   On: 06/03/2016 21:03   Dg Hip Port Unilat With Pelvis 1v Right  Result Date: 06/03/2016 CLINICAL DATA:  Status post placement of right femoral intramedullary nail. Initial encounter. EXAM: DG HIP (WITH OR WITHOUT PELVIS) 1V PORT RIGHT COMPARISON:  Right hip radiographs performed 05/28/2016 FINDINGS: The patient is status post placement of an intramedullary nail and hip screw at the proximal right femur, transfixing the patient's right femoral fracture in grossly anatomic alignment. No new fractures are seen. The right femoral head remains seated at the acetabulum. Overlying postoperative soft tissue air is seen. IMPRESSION: Status post internal fixation of right femoral fracture in grossly anatomic alignment Electronically Signed   By: Garald Balding M.D.   On: 06/03/2016 20:26   Dg Hip Operative Unilat W Or W/o Pelvis Right  Result Date: 06/03/2016 CLINICAL DATA:  Right femoral intramedullary nail placement. Initial encounter. EXAM: OPERATIVE RIGHT HIP WITH PELVIS COMPARISON:  Right hip radiographs performed 05/28/2016 FINDINGS: Two fluoroscopic C-arm images are provided from the OR. These demonstrate placement of a right-sided dynamic hip screw, transfixing the patient's intertrochanteric fracture in grossly anatomic alignment. No new fractures are seen. The right femoral head remains seated at the acetabulum. IMPRESSION: Status post internal fixation of right femoral fracture in grossly anatomic alignment. Electronically Signed   By: Garald Balding M.D.   On: 06/03/2016 21:03    Cardiac Studies   ECHO -  normal EF  Patient Profile     71 y.o. female with right hip fracture while walking dog with elevated troponin and chest pain, with no PE, large left hiatal hernia and surgery postponed due to increased troponin.   Assessment & Plan    NSTEMI/ CAD  mid LAD 90% with DES placement 1/22  - Plavix, ASA  DES LAD placement on 06/01/16  - Crestor 10  - Coreg 12.5 BID, adding losartan 25 QD  - Tele. PVC only  - cardiac rehab when able.   Chest pain  - atypical, tender to palp. ECG reassuring with no ST changes. Remember if LAD stent were to thrombose, this would appear as STEMI in anterior leads. She is feeling better. Likely MSK. Troponin would be of low use since it was previously elevated prior to stent placement and would likely still be elevated or even higher after stent deployment.   Right hip fx  - Intramedullary fixation, Right femur on 06/03/16  GERD, hiatal hernia   PNA  - noted on CT  - ABX  - NO PE.    Essential hypertension  - some BP measurements are normal. Likely driven in part by pain, hip. Monitor.   - added losartan 25  - discussed case with Dr. Grandville Silos.   Will be going to SNF hopefully [facility].   Signed, Candee Furbish, MD  06/05/2016, 11:27 AM

## 2016-06-05 NOTE — Significant Event (Signed)
Rapid Response Event Note  Overview: Time Called: [id number] Arrival Time: 1045 Event Type: Cardiac  Initial Focused Assessment: Patient complaining of intermittent chest tightness (left chest) last a minute or two then resolves spontaneously then returns. One episode of left arm discomfort.  Mild SOB improved with repositioning. Pain worsens with pressure. Also states she has sever pain in her hip. 152/63  SR 89  RR 16  O2 sat 98% on 2l [place] Lung sounds decreased bases, heart tones regular   Interventions: 12 lead EKG done Dilaudid given IV Dr Grandville Silos at bedside to assess patient Ultram given PO  Encourage patient to call if she has additional CP or SOB    Plan of Care (if not transferred): RN to call if patient continues to have CP Cardiology will assess patient  Event Summary: Name of Physician Notified: Dr Grandville Silos at 1045  Name of Consulting Physician Notified: Marlou Porch at 1120  Outcome: Stayed in room and stabalized  Event End Time: [place]  Raliegh Ip

## 2016-06-05 NOTE — Progress Notes (Signed)
Physical Therapy Treatment Patient Details Name: Caitlyn [name] MRN: [id number] DOB: 04/24/1946 Today's Date: 06/05/2016    History of Present Illness 71 yo female admitted on 05/28/16 following a fall on the ice resulting in right hip fx. During her hospitalization, pt had a cardiac event x2 requiring stent placement prior to surgical intervention. IM nail was performed on 06/03/16. PMH significant for anemia, OA, CAD, GERD, HTN, iron deficiency.     PT Comments    Pt is POD 2 and is more limited by pain this session due to increased pain with weight bearing. Pt is very fearful of moving which limits her ability to move further than a stand pivot transfer this session. Pt continues to benefit from SNF placement in order to maximize her functional outcomes as she continues to be a high risk for falls and will require increased assistance if she returns home.    Follow Up Recommendations  SNF;Supervision/Assistance - 24 hour     Equipment Recommendations  Rolling walker with 5" wheels;3in1 (PT);Other (comment) (defer to next venue)    Recommendations for Other Services       Precautions / Restrictions Precautions Precautions: Fall Restrictions Weight Bearing Restrictions: Yes RUE Weight Bearing: Partial weight bearing RUE Partial Weight Bearing Percentage or Pounds: none listed, PWB per RN RLE Weight Bearing: Weight bearing as tolerated    Mobility  Bed Mobility Overal bed mobility: Needs Assistance Bed Mobility: Supine to Sit     Supine to sit: Mod assist     General bed mobility comments: Mod A this session to bring RLE EOB due to pain. Used pad to scoot hips anteriorly.   Transfers Overall transfer level: Needs assistance Equipment used: Rolling walker (2 wheeled) Transfers: Sit to/from [facility] Sit to Stand: Mod assist Stand pivot transfers: Mod assist       General transfer comment: Mod A due to pain for sit to stand with cues for safe hand  placement  Ambulation/Gait             General Gait Details: No gait performed this session due to increased pain with weightbearing.    Stairs            Wheelchair Mobility    Modified Rankin (Stroke Patients Only)       Balance                                    Cognition Arousal/Alertness: Awake/alert Behavior During Therapy: WFL for tasks assessed/performed Overall Cognitive Status: Within Functional Limits for tasks assessed                      Exercises Total Joint Exercises Ankle Circles/Pumps: AROM;Both;20 reps;Supine Quad Sets: AROM;Right;10 reps;Supine    General Comments        Pertinent Vitals/Pain Pain Assessment: 0-10 Pain Score: 6  Pain Location: right anterior Leg and into knee Pain Descriptors / Indicators: Aching;Grimacing;Guarding Pain Intervention(s): Monitored during session;Limited activity within patient's tolerance;Premedicated before session;Repositioned;Ice applied    Home Living                      Prior Function            PT Goals (current goals can now be found in the care plan section) Acute Rehab PT Goals Patient Stated Goal: to get back to walking Progress towards PT goals: Progressing toward goals  Frequency    Min 3X/week      PT Plan Current plan remains appropriate    Co-evaluation             End of Session Equipment Utilized During Treatment: Gait belt Activity Tolerance: Patient limited by pain Patient left: in chair;with call bell/phone within reach;with family/visitor present     Time: 0925-0959 PT Time Calculation (min) (ACUTE ONLY): 34 min  Charges:  $Therapeutic Exercise: 8-22 mins $Therapeutic Activity: 8-22 mins                    G Codes:      Scheryl Marten PT, DPT  [phone]  06/05/2016, 12:26 PM

## 2016-06-05 NOTE — Evaluation (Signed)
Occupational Therapy Evaluation and Discharge (defer further therapy to SNF) Patient Details Name: Caitlyn [name] MRN: [id number] DOB: 12-19-1945 Today's Date: 06/05/2016    History of Present Illness 71 yo female admitted on 05/28/16 following a fall on the ice resulting in right hip fx. During her hospitalization, pt had a cardiac event x2 requiring stent placement prior to surgical intervention. IM nail was performed on 06/03/16. PMH significant for anemia, OA, CAD, GERD, HTN, iron deficiency.    Clinical Impression   PTA Pt independent in ADL/IADL and mobility. Loves walking her dog. Pt currently at max assist for ADL and mod assist for mobility with RW. Pt requires increased time for all movement due to pain. Pt will require SNF level therapies at dc to return to PLOF. OT to defer therapy needs to next venue of care. Pt has stated she would like to go to [facility] in [place] as it is close to family, her church, and her social supports.     Follow Up Recommendations  SNF;Supervision/Assistance - 24 hour    Equipment Recommendations  Other (comment) (defer to next venue of care)    Recommendations for Other Services       Precautions / Restrictions Precautions Precautions: Fall Restrictions Weight Bearing Restrictions: Yes RUE Weight Bearing: Partial weight bearing RUE Partial Weight Bearing Percentage or Pounds: none listed, PWB per RN RLE Weight Bearing: Weight bearing as tolerated      Mobility Bed Mobility Overal bed mobility: Needs Assistance Bed Mobility: Sit to Supine     Supine to sit: Mod assist Sit to supine: Mod assist   General bed mobility comments: mod A for BLE into bed, Pt able to assist with scooting up in bed, but heavy use of bed pad  Transfers Overall transfer level: Needs assistance Equipment used: Rolling walker (2 wheeled) Transfers: Sit to/from Stand Sit to Stand: Mod assist Stand pivot transfers: Mod assist       General transfer  comment: Mod A due to pain for sit to stand with cues for safe hand placement    Balance Overall balance assessment: Needs assistance Sitting-balance support: No upper extremity supported;Feet supported Sitting balance-Leahy Scale: Fair     Standing balance support: Bilateral upper extremity supported;During functional activity Standing balance-Leahy Scale: Fair Standing balance comment: able to stand using RW for peri care                            ADL Overall ADL's : Needs assistance/impaired Eating/Feeding: Set up   Grooming: Set up;Sitting   Upper Body Bathing: Minimal assistance;Sitting   Lower Body Bathing: Maximal assistance   Upper Body Dressing : Minimal assistance   Lower Body Dressing: Maximal assistance;Sit to/from stand   Toilet Transfer: Moderate assistance;BSC;RW Toilet Transfer Details (indicate cue type and reason): vc for sequencing and moral encouragement Toileting- Clothing Manipulation and Hygiene: Maximal assistance;Sit to/from stand Toileting - Clothing Manipulation Details (indicate cue type and reason): dependent in peri care     Functional mobility during ADLs: Minimal assistance;Rolling walker (requires increased time)       Vision Vision Assessment?: No apparent visual deficits   Perception     Praxis      Pertinent Vitals/Pain Pain Assessment: 0-10 Pain Score: 6  Pain Location: right anterior Leg and into knee Pain Descriptors / Indicators: Aching;Grimacing;Guarding Pain Intervention(s): Monitored during session;Repositioned;Premedicated before session;Ice applied     Hand Dominance Right   Extremity/Trunk Assessment Upper Extremity  Assessment Upper Extremity Assessment: Generalized weakness   Lower Extremity Assessment Lower Extremity Assessment: RLE deficits/detail RLE Deficits / Details: pt with normal post op pain and weakness   Cervical / Trunk Assessment Cervical / Trunk Assessment: Normal   Communication  Communication Communication: No difficulties   Cognition Arousal/Alertness: Awake/alert Behavior During Therapy: WFL for tasks assessed/performed;Anxious (teary at points) Overall Cognitive Status: Within Functional Limits for tasks assessed                     General Comments       Exercises      Shoulder Instructions      Home Living Family/patient expects to be discharged to:: Skilled nursing facility Living Arrangements: Alone                               Additional Comments: Pt requesting [facility] in [place]      Prior Functioning/Environment Level of Independence: Independent        Comments: was completely independent walking dog when she fell. Was driving and working at [employer]. No limtations        OT Problem List: Decreased strength;Decreased range of motion;Decreased activity tolerance;Impaired balance (sitting and/or standing);Decreased safety awareness;Decreased knowledge of use of DME or AE;Pain   OT Treatment/Interventions:      OT Goals(Current goals can be found in the care plan section) Acute Rehab OT Goals Patient Stated Goal: to get back to walking OT Goal Formulation: With patient Time For Goal Achievement: 06/12/16 Potential to Achieve Goals: Good  OT Frequency:     Barriers to D/C:   Pt lives alone          Co-evaluation              End of Session Equipment Utilized During Treatment: Gait belt;Rolling walker Nurse Communication: Mobility status;Weight bearing status;Other (comment) (Pt back in bed and ready for X ray Transport)  Activity Tolerance: Patient tolerated treatment well Patient left: in bed;with call bell/phone within reach;with family/visitor present;Other (comment) (O2 back on)   Time: 1209-1248 OT Time Calculation (min): 39 min Charges:  OT General Charges $OT Visit: 1 Procedure OT Evaluation $OT Eval Moderate Complexity: 1 Procedure OT Treatments $Self Care/Home  Management : 38-52 mins G-Codes:    Merri Ray Santanna Olenik 06-14-16, 1:37 PM Hulda Humphrey OTR/L [phone]

## 2016-06-05 NOTE — Progress Notes (Signed)
PROGRESS NOTE    Caitlyn [name]  [id number] DOB: 1946-02-14 DOA: 05/28/2016 PCP: Henrine Screws, MD    Brief Narrative:  Caitlyn [name] a 71 y.o.femalewith a past medical history significant for hypothyroidism, HTN and iron def anemiawho presents with hip pain after a fall. The patient was in hernormal state of health until this evening when she was walking her dog, slipped on the ice, fell on her right hipand had immediate RIGHThip pain and could not walk.  While she was in the hospital developed chest pain and pneumonia, currently on antibiotics and chest pain seen by cardiology cardiac catheterization was done and a stent placed on the mid LAD on 1/22.    Assessment & Plan:   Principal Problem:   Closed fracture of right hip (HCC) Active Problems:   Iron deficiency anemia   Essential hypertension   Hypothyroidism, acquired   Chest pain   Status post coronary artery stent placement   NSTEMI (non-ST elevated myocardial infarction) ([place])   Displaced intertrochanteric fracture of right femur, initial encounter for closed fracture ([place])  Non-STEMI /coronary artery disease mid LAD 90% stenosis with drug-eluting stent placement 06/01/2016 per cardiac catheterization. Patient would recent cardiac catheterization after complaints of chest pain and slightly elevated troponin. Patient status post drug-eluting stent to the mid LAD. Patient has been seen by cardiology during the hospitalization and recommend that patient's aspirin and Plavix be continued during surgery due to recent stent placement on 06/01/2016. Echo 1/20=> EF 60% Patient had chest pain with troponin peak of 0.2. Started on IV heparin, beta blockers statin and aspirin, cardiac catheterization done on 1/22. Continue other cardiac medications of Crestor, Coreg. ARB started per cardiology for better blood pressure control and for cardiac etiology. Patient status post surgery. Outpatient follow-up with Dr.  [name].  Atypical chest pain Patient with complaints of atypical chest pain underneath left breast. Chest wall tenderness to palpation and pain is reproducible. EKG within normal sinus rhythm. Likely musculoskeletal in nature versus costochondritis. Place on Ultram as needed. Cardiology has been notified and has assessed the patient and is in agreement.  Non displaced intertrochanteric fracture on the right.  Patient status post IM fixation right femur 06/03/2016 per Dr. Lyla Glassing. Weightbearing as tolerated with walker. Awaiting PT OT evaluation. Per orthopedics.  HCAP Was on vancomycin, cefepime and levofloxacin. Cefepime discontinued, currently on vancomycin and levofloxacin. Antibiotics have been narrowed, by discontinuing vancomycin and changing Levaquin to oral Levaquin to complete cause of antibiotic treatment. Continue supportive management with bronchodilators and mucolytics.  Hypertension. Continue BB and statin and ARB.  Iron deficiency anemia. Hgb -WNL. Ferritin 90s in July. Is intolerant to oral iron, gets iron infusions with Dr. Beryle Beams. H&H stable. Follow postoperatively.  Hypothyroidism. Continue levothyroxine  H/o asthma.No acute exacerbations. Exam is unremarkable. Continue scheduled nebulizers.  Pancreatic insufficiency Patient's diet has been resumed as well as her Creon.    DVT prophylaxis: SCDs. Per orthopedics. Code Status: Full Family Communication: Updated patient and family at bedside. Disposition Plan: Likely skilled nursing facility once medically stable.    Consultants:   Orthopedics: Dr.Swinteck 05/29/2016  Cardiology: Dr.Skains 05/30/2016  Procedures:   CT angiogram chest 05/30/2016  Chest x-ray 05/28/2016, 05/30/2016  2-D echo 05/30/2016  Cardiac catheterization 06/01/2016. Dr. [name]--- proximal RCA 20% stenosed, mid LAD lesion 90% stenosed. Postintervention there is a 0% residual stenosis. A drug-eluting stent was  successfully placed  Plain films of the right hip and pelvis 05/28/2016  IM fixation right femur per Dr. Lyla Glassing 06/03/2016  Antimicrobials:  IV cefepime 05/30/2016>>>> 06/01/2016  IV Levaquin 05/30/2016>>>> oral Levaquin 06/04/2016  IV vancomycin 05/30/2016>>>>>>06/04/2016   Subjective: Patient c/o chest pain with radiation to the left arm which has been intermittent in nature. Patient states some associated shortness of breath.  Objective: Vitals:   06/04/16 1447 06/04/16 2100 06/05/16 0620 06/05/16 1043  BP: (!) 92/56 118/76 (!) 137/56 (!) 152/63  Pulse: 76 66 83 89  Resp: 16 16 16 16   Temp: 99.1 F (37.3 C) 98.9 F (37.2 C) 98.9 F (37.2 C) 99 F (37.2 C)  TempSrc: Oral Oral Oral Oral  SpO2: 95% 96% 94% 95%  Weight:      Height:        Intake/Output Summary (Last 24 hours) at 06/05/16 1119 Last data filed at 06/05/16 1100  Gross per 24 hour  Intake              720 ml  Output                0 ml  Net              720 ml   Filed Weights   05/28/16 1933 06/02/16 0400  Weight: 79.4 kg (175 lb) 79 kg (174 lb 2.6 oz)    Examination:  General exam: Appears calm and comfortable  Respiratory system: Clear to auscultation. Respiratory effort normal. Cardiovascular system: S1 & S2 heard, RRR. No JVD, murmurs, rubs, gallops or clicks. No pedal edema.Left chest wall on the left breast tender to palpation. Gastrointestinal system: Abdomen is nondistended, soft and nontender. No organomegaly or masses felt. Normal bowel sounds heard. Central nervous system: Alert and oriented. No focal neurological deficits. Extremities: Symmetric 5 x 5 power. Skin: No rashes, lesions or ulcers Psychiatry: Judgement and insight appear normal. Mood & affect appropriate.     Data Reviewed: I have personally reviewed following labs and imaging studies  CBC:  Recent Labs Lab 06/01/16 0548 06/02/16 0220 06/03/16 0219 06/04/16 0516 06/05/16 0728  WBC 6.8 6.6 7.8 11.9* 11.8*   HGB 10.7* 10.9* 12.0 10.7* 10.6*  HCT 32.8* 33.7* 36.5 33.1* 32.7*  MCV 88.9 88.2 88.2 87.8 88.6  PLT 142* 185 234 235 [id number]   Basic Metabolic Panel:  Recent Labs Lab 06/01/16 0548 06/02/16 0220 06/03/16 0219 06/04/16 0516 06/05/16 0728 06/05/16 0839  NA 138 135 136 134* 135  --   K 3.4* 3.8 3.6 3.5 3.2*  --   CL 104 101 101 100* 98*  --   CO2 27 24 27 23 26   --   GLUCOSE 138* 109* 144* 151* 143*  --   BUN 8 8 10 18 18   --   CREATININE 0.56 0.71 0.61 0.81 0.75  --   CALCIUM 8.1* 8.6* 8.8* 8.6* 8.7*  --   MG  --   --   --   --   --  2.3   GFR: Estimated Creatinine Clearance: 67.4 mL/min (by C-G formula based on SCr of 0.75 mg/dL). Liver Function Tests:  Recent Labs Lab 05/31/16 0556 06/01/16 0548  AST 17 16  ALT 10* 10*  ALKPHOS 57 50  BILITOT 1.0 0.8  PROT 5.2* 5.3*  ALBUMIN 2.5* 2.5*   No results for input(s): LIPASE, AMYLASE in the last 168 hours. No results for input(s): AMMONIA in the last 168 hours. Coagulation Profile: No results for input(s): INR, PROTIME in the last 168 hours. Cardiac Enzymes:  Recent Labs Lab 05/30/16 0604 05/30/16 1141 05/30/16 1815 05/31/16 0018 05/31/16 [id number]  TROPONINI 0.03* <0.03 0.08* 0.18* 0.20*   BNP (last 3 results) No results for input(s): PROBNP in the last 8760 hours. HbA1C: No results for input(s): HGBA1C in the last 72 hours. CBG: No results for input(s): GLUCAP in the last 168 hours. Lipid Profile: No results for input(s): CHOL, HDL, LDLCALC, TRIG, CHOLHDL, LDLDIRECT in the last 72 hours. Thyroid Function Tests: No results for input(s): TSH, T4TOTAL, FREET4, T3FREE, THYROIDAB in the last 72 hours. Anemia Panel: No results for input(s): VITAMINB12, FOLATE, FERRITIN, TIBC, IRON, RETICCTPCT in the last 72 hours. Sepsis Labs: No results for input(s): PROCALCITON, LATICACIDVEN in the last 168 hours.  Recent Results (from the past 240 hour(s))  Surgical pcr screen     Status: Abnormal   Collection Time: 05/29/16   2:09 AM  Result Value Ref Range Status   MRSA, PCR NEGATIVE NEGATIVE Final   Staphylococcus aureus POSITIVE (A) NEGATIVE Final    Comment:        The Xpert SA Assay (FDA approved for NASAL specimens in patients over 65 years of age), is one component of a comprehensive surveillance program.  Test performance has been validated by [facility] for patients greater than or equal to 23 year old. It is not intended to diagnose infection nor to guide or monitor treatment.          Radiology Studies: Dg C-arm 1-60 Min  Result Date: 06/03/2016 CLINICAL DATA:  Right femoral intramedullary nail placement. Initial encounter. EXAM: OPERATIVE RIGHT HIP WITH PELVIS COMPARISON:  Right hip radiographs performed 05/28/2016 FINDINGS: Two fluoroscopic C-arm images are provided from the OR. These demonstrate placement of a right-sided dynamic hip screw, transfixing the patient's intertrochanteric fracture in grossly anatomic alignment. No new fractures are seen. The right femoral head remains seated at the acetabulum. IMPRESSION: Status post internal fixation of right femoral fracture in grossly anatomic alignment. Electronically Signed   By: Garald Balding M.D.   On: 06/03/2016 21:03   Dg Hip Port Unilat With Pelvis 1v Right  Result Date: 06/03/2016 CLINICAL DATA:  Status post placement of right femoral intramedullary nail. Initial encounter. EXAM: DG HIP (WITH OR WITHOUT PELVIS) 1V PORT RIGHT COMPARISON:  Right hip radiographs performed 05/28/2016 FINDINGS: The patient is status post placement of an intramedullary nail and hip screw at the proximal right femur, transfixing the patient's right femoral fracture in grossly anatomic alignment. No new fractures are seen. The right femoral head remains seated at the acetabulum. Overlying postoperative soft tissue air is seen. IMPRESSION: Status post internal fixation of right femoral fracture in grossly anatomic alignment Electronically Signed   By: Garald Balding M.D.   On: 06/03/2016 20:26   Dg Hip Operative Unilat W Or W/o Pelvis Right  Result Date: 06/03/2016 CLINICAL DATA:  Right femoral intramedullary nail placement. Initial encounter. EXAM: OPERATIVE RIGHT HIP WITH PELVIS COMPARISON:  Right hip radiographs performed 05/28/2016 FINDINGS: Two fluoroscopic C-arm images are provided from the OR. These demonstrate placement of a right-sided dynamic hip screw, transfixing the patient's intertrochanteric fracture in grossly anatomic alignment. No new fractures are seen. The right femoral head remains seated at the acetabulum. IMPRESSION: Status post internal fixation of right femoral fracture in grossly anatomic alignment. Electronically Signed   By: Garald Balding M.D.   On: 06/03/2016 21:03        Scheduled Meds: . aspirin  81 mg Oral BID WC  . carvedilol  12.5 mg Oral BID WC  . clopidogrel  75 mg Oral Q breakfast  .  feeding supplement (ENSURE ENLIVE)  237 mL Oral Q1500  . levofloxacin  750 mg Oral Daily  . levothyroxine  88 mcg Oral QAC breakfast  . lipase/protease/amylase  36,000 Units Oral Daily  . losartan  25 mg Oral Daily  . mupirocin ointment   Nasal BID  . pantoprazole  40 mg Oral BID  . potassium chloride  40 mEq Oral Q4H  . rosuvastatin  10 mg Oral QODAY   Continuous Infusions:   LOS: 8 days    Time spent: 78 mins    [name],DANIEL, MD [facility] Pager [phone]  If 7PM-7AM, please contact night-coverage [email or web address] Password [email or web address] 06/05/2016, 11:19 AM

## 2016-06-05 NOTE — Clinical Social Work Note (Signed)
Clinical Social Work Assessment  Patient Details  Name: Caitlyn [name] MRN: [id number] Date of Birth: 12-Dec-1945  Date of referral:  06/05/16               Reason for consult:  Facility Placement                Permission sought to share information with:  Family Supports Permission granted to share information::     Name::        Agency::     Relationship::     Contact Information:     Housing/Transportation Living arrangements for the past 2 months:  [place] of Information:  Patient Patient Interpreter Needed:  None Criminal Activity/Legal Involvement Pertinent to Current Situation/Hospitalization:  No - Comment as needed Significant Relationships:  Other Family Members, Friend Lives with:  Self Do you feel safe going back to the place where you live?    Need for family participation in patient care:     Care giving concerns:  No family or friends present at bedside during initial assessment.    Social Worker assessment / plan:  CSW spoke with pt at bedside to complete initial assessment. Pt lives at home alone however, pt reports she has a sister, and many supportive friends in the area. Pt is agreeable to SNF placement at this time. Pt wants to go to [facility], [place]. CSW will send referral and follow up with pt.   Employment status:  Retired Nurse, adult PT Recommendations:  [place] / Referral to community resources:  [place]  Patient/Family's Response to care: Rehab Admissions Coordinator Note:Pt verbalized understanding of CSW role and expressed appreciation for support. Pt denies any concern regarding pt care at this time.    Patient/Family's Understanding of and Emotional Response to Diagnosis, Current Treatment, and Prognosis:  Pt understanding and realistic regarding physical limitations. Pt understands the need for SNF placement at d/c. Pt agreeable to SNF  placement at d/c, at this time. Pt's responses emotionally appropriate during conversation with CSW. Pt denies any concern regarding treatment plan at this time. CSW will continue to provide support and facilitate d/c needs.   Emotional Assessment Appearance:  Appears stated age Attitude/Demeanor/Rapport:   (Patient was approrpriate.) Affect (typically observed):  Accepting, Appropriate, Calm Orientation:  Oriented to Self, Oriented to Place, Oriented to  Time, Oriented to Situation Alcohol / Substance use:  Not Applicable Psych involvement (Current and /or in the community):  No (Comment)  Discharge Needs  Concerns to be addressed:  No discharge needs identified Readmission within the last 30 days:  No Current discharge risk:  Dependent with Mobility Barriers to Discharge:  Continued Medical Work up   [employer], LCSW 06/05/2016, 11:44 AM

## 2016-06-05 NOTE — Clinical Social Work Placement (Signed)
   CLINICAL SOCIAL WORK PLACEMENT  NOTE  Date:  06/05/2016  Patient Details  Name: Caitlyn [name] MRN: [id number] Date of Birth: 09/18/1945  Clinical Social Work is seeking post-discharge placement for this patient at the [place] level of care (*CSW will initial, date and re-position this form in  chart as items are completed):      Patient/family provided with [place] Work Department's list of facilities offering this level of care within the geographic area requested by the patient (or if unable, by the patient's family).      Patient/family informed of their freedom to choose among providers that offer the needed level of care, that participate in Medicare, Medicaid or managed care program needed by the patient, have an available bed and are willing to accept the patient.      Patient/family informed of 's ownership interest in [facility] and [facility], as well as of the fact that they are under no obligation to receive care at these facilities.  PASRR submitted to EDS on       PASRR number received on 06/05/16     Existing PASRR number confirmed on       FL2 transmitted to all facilities in geographic area requested by pt/family on 06/05/16     FL2 transmitted to all facilities within larger geographic area on       Patient informed that his/her managed care company has contracts with or will negotiate with certain facilities, including the following:        Yes   Patient/family informed of bed offers received.  Patient chooses bed at       Physician recommends and patient chooses bed at      Patient to be transferred to   on  .  Patient to be transferred to facility by       Patient family notified on   of transfer.  Name of family member notified:        PHYSICIAN Please prepare priority discharge summary, including medications, Please prepare prescriptions, Please sign FL2     Additional Comment:     _______________________________________________ Alla German, LCSW 06/05/2016, 12:00 PM

## 2016-06-05 NOTE — NC FL2 (Signed)
[place] MEDICAID FL2 LEVEL OF CARE SCREENING TOOL     IDENTIFICATION  Patient Name: Caitlyn [name] Birthdate: 01-20-1946 Sex: female Admission Date (Current Location): 05/28/2016  [facility] and [place] Number:  Herbalist and Address:  [facility], [place] [address] [place], [place] [address]      Provider Number: [id number]  Attending Physician Name and Address:  Eugenie Filler, MD  Relative Name and Phone Number:       Current Level of Care: Hospital Recommended Level of Care: [place] Prior Approval Number:    Date Approved/Denied: 06/05/16 PASRR Number: [id number]  Discharge Plan: SNF    Current Diagnoses: Patient Active Problem List   Diagnosis Date Noted  . Displaced intertrochanteric fracture of right femur, initial encounter for closed fracture ([place]) 06/03/2016  . Status post coronary artery stent placement   . NSTEMI (non-ST elevated myocardial infarction) ([place])   . Chest pain 05/30/2016  . Hypothyroidism, acquired 05/28/2016  . Closed fracture of right hip ([place]) 05/28/2016  . Essential hypertension 10/29/2015  . Hyperlipidemia 10/29/2015  . Family history of heart disease 10/29/2015  . Radicular pain of thoracic region 08/22/2013  . Mass of right kidney 08/22/2013  . Iron deficiency anemia 02/22/2013    Orientation RESPIRATION BLADDER Height & Weight     Self, Time, Situation, Place  Normal Continent Weight: 174 lb 2.6 oz (79 kg) Height:  5\' 6"  (167.6 cm)  BEHAVIORAL SYMPTOMS/MOOD NEUROLOGICAL BOWEL NUTRITION STATUS      Continent  (Please see d/c summary)  AMBULATORY STATUS COMMUNICATION OF NEEDS Skin   Limited Assist Verbally Surgical wounds (Closed incision right hip, foam dressing.)                       Personal Care Assistance Level of Assistance  Bathing, Feeding, Dressing Bathing Assistance: Limited assistance Feeding assistance: Independent Dressing Assistance: Limited assistance    Functional Limitations Info  Sight, Hearing, Speech Sight Info: Adequate Hearing Info: Adequate Speech Info: Adequate    SPECIAL CARE FACTORS FREQUENCY  PT (By licensed PT), OT (By licensed OT)     PT Frequency: 3x week OT Frequency: 3x week            Contractures Contractures Info: Not present    Additional Factors Info  Code Status, Allergies Code Status Info: Full Code Allergies Info: Cortisone, Monosodium Glutamate, Prednisone, Shellfish Allergy, Buprenorphine Hcl, Morphine And Related, Readi-cat Barium Sulfate, Demerol Meperidine, Sulfa Antibiotics, Benzoic Acid           Current Medications (06/05/2016):  This is the current hospital active medication list Current Facility-Administered Medications  Medication Dose Route Frequency Provider Last Rate Last Dose  . acetaminophen (TYLENOL) tablet 650 mg  650 mg Oral Q4H PRN Lorretta Harp, MD   650 mg at 06/05/16 [id number]  . albuterol (PROVENTIL) (2.5 MG/3ML) 0.083% nebulizer solution 2.5 mg  2.5 mg Nebulization Q6H PRN Eugenie Filler, MD      . aspirin chewable tablet 81 mg  81 mg Oral BID WC Rod Can, MD   81 mg at 06/05/16 0901  . bisacodyl (DULCOLAX) suppository 10 mg  10 mg Rectal Daily PRN Edwin Dada, MD   10 mg at 06/02/16 1358  . carvedilol (COREG) tablet 12.5 mg  12.5 mg Oral BID WC Jani Gravel, MD   12.5 mg at 06/05/16 0902  . clopidogrel (PLAVIX) tablet 75 mg  75 mg Oral Q breakfast Pearletha Forge  [name], MD   75 mg at 06/05/16 0900  . feeding supplement (ENSURE ENLIVE) (ENSURE ENLIVE) liquid 237 mL  237 mL Oral Q1500 Kinnie Feil, MD   237 mL at 06/04/16 1500  . hydrALAZINE (APRESOLINE) injection 10 mg  10 mg Intravenous Q6H PRN Jani Gravel, MD   10 mg at 06/02/16 1824  . HYDROcodone-acetaminophen (NORCO) 7.5-325 MG per tablet 1-2 tablet  1-2 tablet Oral Q6H PRN Rod Can, MD   1 tablet at 06/04/16 1623  . HYDROmorphone (DILAUDID) injection 0.5 mg  0.5 mg Intravenous Q2H PRN Nicholes Stairs, MD   0.5 mg at 06/05/16 1055  . levofloxacin (LEVAQUIN) tablet 750 mg  750 mg Oral Daily Irine Seal V, MD   750 mg at 06/05/16 0900  . levothyroxine (SYNTHROID, LEVOTHROID) tablet 88 mcg  88 mcg Oral QAC breakfast Edwin Dada, MD   88 mcg at 06/05/16 0902  . lipase/protease/amylase (CREON) capsule 36,000 Units  36,000 Units Oral Daily Jani Gravel, MD   36,000 Units at 06/05/16 1014  . losartan (COZAAR) tablet 25 mg  25 mg Oral Daily Jerline Pain, MD   25 mg at 06/05/16 0902  . menthol-cetylpyridinium (CEPACOL) lozenge 3 mg  1 lozenge Oral PRN Rod Can, MD       Or  . phenol (CHLORASEPTIC) mouth spray 1 spray  1 spray Mouth/Throat PRN Rod Can, MD      . methocarbamol (ROBAXIN) tablet 500 mg  500 mg Oral Q6H PRN Rod Can, MD   500 mg at 06/05/16 [id number]   Or  . methocarbamol (ROBAXIN) 500 mg in dextrose 5 % 50 mL IVPB  500 mg Intravenous Q6H PRN Rod Can, MD      . metoCLOPramide (REGLAN) tablet 5-10 mg  5-10 mg Oral Q8H PRN Rod Can, MD       Or  . metoCLOPramide (REGLAN) injection 5-10 mg  5-10 mg Intravenous Q8H PRN Rod Can, MD      . mupirocin ointment (BACTROBAN) 2 %   Nasal BID Rod Can, MD   1 application at 0000000 1000  . naloxone [facility]) injection 0.4 mg  0.4 mg Intravenous PRN Jani Gravel, MD      . naloxone [facility]) injection 0.4 mg  0.4 mg Intravenous PRN Jani Gravel, MD   0.4 mg at 05/30/16 1705  . ondansetron (ZOFRAN) injection 4 mg  4 mg Intravenous Q6H PRN Lorretta Harp, MD   4 mg at 06/04/16 0117  . pantoprazole (PROTONIX) EC tablet 40 mg  40 mg Oral BID Jani Gravel, MD   40 mg at 06/05/16 0902  . polyethylene glycol (MIRALAX / GLYCOLAX) packet 17 g  17 g Oral Daily PRN Edwin Dada, MD   17 g at 06/02/16 0843  . potassium chloride SA (K-DUR,KLOR-CON) CR tablet 40 mEq  40 mEq Oral Q4H Eugenie Filler, MD   40 mEq at 06/05/16 0902  . rosuvastatin (CRESTOR) tablet 10 mg  10 mg Oral QODAY Edwin Dada,  MD   10 mg at 06/04/16 1027  . traMADol (ULTRAM) tablet 100 mg  100 mg Oral Q6H PRN Eugenie Filler, MD   100 mg at 06/05/16 1127   Facility-Administered Medications Ordered in Other Encounters  Medication Dose Route Frequency Provider Last Rate Last Dose  . fentaNYL (SUBLIMAZE) injection    Anesthesia Intra-op Sheppard Plumber, CRNA   50 mcg at 05/31/16 0721  . lactated ringers infusion    Continuous PRN Scot  Tressie Ellis, CRNA 50 mL/hr at 06/03/16 2300       Discharge Medications: Please see discharge summary for a list of discharge medications.  Relevant Imaging Results:  Relevant Lab Results:   Additional Information SSN: [id number]  Alla German, LCSW

## 2016-06-05 NOTE — Progress Notes (Signed)
No further c/o chest discomfort at this time. Will continue to observe.

## 2016-06-05 NOTE — Progress Notes (Signed)
   Subjective:  Patient reports pain as mild to moderate.  No c/o.  Objective:   VITALS:   Vitals:   06/04/16 0440 06/04/16 1447 06/04/16 2100 06/05/16 0620  BP: (!) 155/63 (!) 92/56 118/76 (!) 137/56  Pulse: 98 76 66 83  Resp: 14 16 16 16   Temp: 98.9 F (37.2 C) 99.1 F (37.3 C) 98.9 F (37.2 C) 98.9 F (37.2 C)  TempSrc: Oral Oral Oral Oral  SpO2: 91% 95% 96% 94%  Weight:      Height:        NAD ABD soft Sensation intact distally Intact pulses distally Dorsiflexion/Plantar flexion intact Incision: dressing C/D/I Compartment soft   Lab Results  Component Value Date   WBC 11.9 (H) 06/04/2016   HGB 10.7 (L) 06/04/2016   HCT 33.1 (L) 06/04/2016   MCV 87.8 06/04/2016   PLT 235 06/04/2016   BMET    Component Value Date/Time   NA 134 (L) 06/04/2016 0516   NA 139 08/26/2015 1439   K 3.5 06/04/2016 0516   CL 100 (L) 06/04/2016 0516   CO2 23 06/04/2016 0516   GLUCOSE 151 (H) 06/04/2016 0516   BUN 18 06/04/2016 0516   BUN 15 08/26/2015 1439   CREATININE 0.81 06/04/2016 0516   CREATININE 0.65 09/18/2014 1042   CALCIUM 8.6 (L) 06/04/2016 0516   GFRNONAA >60 06/04/2016 0516   GFRNONAA 85 05/15/2014 1053   GFRAA >60 06/04/2016 0516   GFRAA >89 05/15/2014 1053     Assessment/Plan: 2 Days Post-Op   Principal Problem:   Closed fracture of right hip (HCC) Active Problems:   Iron deficiency anemia   Essential hypertension   Hypothyroidism, acquired   Chest pain   Status post coronary artery stent placement   NSTEMI (non-ST elevated myocardial infarction) ([place])   Displaced intertrochanteric fracture of right femur, initial encounter for closed fracture ([place])   WBAT with walker DVT ppx: ASA, plavix, SCDs, TEDs PO pain control PT/OT Dispo: D/C home with HHPT if mobilizes well    Joane Postel, Horald Pollen 06/05/2016, 7:42 AM   Rod Can, MD Cell [phone]

## 2016-06-05 NOTE — Plan of Care (Signed)
Problem: Education: Goal: Knowledge of [facility] Education information/materials will improve Outcome: Progressing POC reviewed with pt.   

## 2016-06-06 DIAGNOSIS — R079 Chest pain, unspecified: Secondary | ICD-10-CM

## 2016-06-06 LAB — CBC
HCT: 31.9 % — ABNORMAL LOW (ref 36.0–46.0)
Hemoglobin: 10.3 g/dL — ABNORMAL LOW (ref 12.0–15.0)
MCH: 29 pg (ref 26.0–34.0)
MCHC: 32.3 g/dL (ref 30.0–36.0)
MCV: 89.9 fL (ref 78.0–100.0)
PLATELETS: 221 10*3/uL (ref 150–400)
RBC: 3.55 MIL/uL — ABNORMAL LOW (ref 3.87–5.11)
RDW: 13.9 % (ref 11.5–15.5)
WBC: 10.7 10*3/uL — AB (ref 4.0–10.5)

## 2016-06-06 LAB — BASIC METABOLIC PANEL
ANION GAP: 8 (ref 5–15)
BUN: 21 mg/dL — ABNORMAL HIGH (ref 6–20)
CALCIUM: 8.5 mg/dL — AB (ref 8.9–10.3)
CO2: 24 mmol/L (ref 22–32)
Chloride: 102 mmol/L (ref 101–111)
Creatinine, Ser: 0.54 mg/dL (ref 0.44–1.00)
GLUCOSE: 151 mg/dL — AB (ref 65–99)
Potassium: 4 mmol/L (ref 3.5–5.1)
SODIUM: 134 mmol/L — AB (ref 135–145)

## 2016-06-06 MED ORDER — ASPIRIN 81 MG PO CHEW: 81.0000 mg | CHEWABLE_TABLET | Freq: Two times a day (BID) | ORAL | Status: DC

## 2016-06-06 MED ORDER — METHOCARBAMOL 500 MG PO TABS: 500.0000 mg | ORAL_TABLET | Freq: Four times a day (QID) | ORAL | 3 refills | Status: DC | PRN

## 2016-06-06 MED ORDER — TRAMADOL HCL 50 MG PO TABS: 100.0000 mg | ORAL_TABLET | Freq: Four times a day (QID) | ORAL | 0 refills | Status: DC | PRN

## 2016-06-06 MED ORDER — LOSARTAN POTASSIUM 25 MG PO TABS: 25.0000 mg | ORAL_TABLET | Freq: Every day | ORAL | 2 refills | Status: DC

## 2016-06-06 MED ORDER — CARVEDILOL 12.5 MG PO TABS: 12.5000 mg | ORAL_TABLET | Freq: Two times a day (BID) | ORAL | 3 refills | Status: DC

## 2016-06-06 MED ORDER — OMEPRAZOLE 40 MG PO CPDR: 40.0000 mg | DELAYED_RELEASE_CAPSULE | Freq: Every day | ORAL | 0 refills | Status: DC

## 2016-06-06 MED ORDER — HYDROXYZINE HCL 25 MG PO TABS: 25.0000 mg | ORAL_TABLET | Freq: Three times a day (TID) | ORAL | Status: DC | PRN

## 2016-06-06 MED ORDER — BOOST / RESOURCE BREEZE PO LIQD: 1.0000 | Freq: Two times a day (BID) | ORAL | 0 refills | Status: DC

## 2016-06-06 MED ORDER — PRO-STAT SUGAR FREE PO LIQD: 30.0000 mL | Freq: Two times a day (BID) | ORAL | 0 refills | Status: DC

## 2016-06-06 MED ORDER — HYDROCODONE-ACETAMINOPHEN 7.5-325 MG PO TABS: 1.0000 | ORAL_TABLET | Freq: Four times a day (QID) | ORAL | 0 refills | Status: DC | PRN

## 2016-06-06 MED ORDER — CLOPIDOGREL BISULFATE 75 MG PO TABS: 75.0000 mg | ORAL_TABLET | Freq: Every day | ORAL | 2 refills | Status: DC

## 2016-06-06 NOTE — Progress Notes (Signed)
   Subjective:  Patient reports pain as mild to moderate.  No c/o.  Objective:   VITALS:   Vitals:   06/05/16 1534 06/05/16 2029 06/05/16 2136 06/06/16 0555  BP: 122/60 (!) 143/73 (!) 144/62 (!) 145/61  Pulse: 84 78 79 81  Resp: 15 16 20 18   Temp: 98.1 F (36.7 C) 98.8 F (37.1 C) 98.8 F (37.1 C) 98.3 F (36.8 C)  TempSrc:  Oral Oral Oral  SpO2: 95% 99% 99% 99%  Weight:      Height:        NAD ABD soft Sensation intact distally Intact pulses distally Dorsiflexion/Plantar flexion intact Incision: dressing C/D/I Compartment soft   Lab Results  Component Value Date   WBC 10.7 (H) 06/06/2016   HGB 10.3 (L) 06/06/2016   HCT 31.9 (L) 06/06/2016   MCV 89.9 06/06/2016   PLT 221 06/06/2016   BMET    Component Value Date/Time   NA 135 06/05/2016 0728   NA 139 08/26/2015 1439   K 3.2 (L) 06/05/2016 0728   CL 98 (L) 06/05/2016 0728   CO2 26 06/05/2016 0728   GLUCOSE 143 (H) 06/05/2016 0728   BUN 18 06/05/2016 0728   BUN 15 08/26/2015 1439   CREATININE 0.75 06/05/2016 0728   CREATININE 0.65 09/18/2014 1042   CALCIUM 8.7 (L) 06/05/2016 0728   GFRNONAA >60 06/05/2016 0728   GFRNONAA 85 05/15/2014 1053   GFRAA >60 06/05/2016 0728   GFRAA >89 05/15/2014 1053     Assessment/Plan: 3 Days Post-Op   Principal Problem:   Closed fracture of right hip (HCC) Active Problems:   Iron deficiency anemia   Essential hypertension   Hypothyroidism, acquired   Chest pain   Status post coronary artery stent placement   NSTEMI (non-ST elevated myocardial infarction) ([place])   Displaced intertrochanteric fracture of right femur, initial encounter for closed fracture ([place])   Postop check   WBAT with walker DVT ppx: ASA, plavix, SCDs, TEDs PO pain control PT/OT Dispo: D/C home with HHPT if mobilizes well    Elie Goody 06/06/2016, 10:37 AM   Rod Can, MD Cell [phone]

## 2016-06-06 NOTE — Progress Notes (Addendum)
CSW received call from Dr. Grandville Silos stating that pt is ready for d/c. Pt has a bed available at [facility].  CSW called [facility] [phone]). Per April, pt cannot be transferred to the facility today because it is too late in the day to get pt's medications to the pharmacy. Pt will d/c to [facility] tomorrow morning. RN informed. CSW will leave note for CSW covering tomorrow.   Georga Kaufmann, MSW, [facility] [phone]

## 2016-06-06 NOTE — Discharge Summary (Signed)
Physician Discharge Summary  Caitlyn [name] [id number] DOB: 1945-10-15 DOA: 05/28/2016  PCP: Henrine Screws, MD  Admit date: 05/28/2016 Discharge date: 06/06/2016  Time spent: 65 minutes  Recommendations for Outpatient Follow-up:  1. Follow-up with Dr. [name], cardiology 1-2 weeks postdischarge. 2. Follow-up with Dr. Lyla Glassing, orthopedics in 2 weeks for wound recheck. 3. Follow-up with M.D. at skilled nursing facility. Patient will need a basic metabolic profile done in 1 week to follow-up on electrolytes and renal function.   Discharge Diagnoses:  Principal Problem:   Closed fracture of right hip ([place]) Active Problems:   Iron deficiency anemia   Essential hypertension   Hypothyroidism, acquired   Chest pain   Status post coronary artery stent placement   NSTEMI (non-ST elevated myocardial infarction) ([name])   Displaced intertrochanteric fracture of right femur, initial encounter for closed fracture [facility])   Postop check   Discharge Condition: Stable and improved  Diet recommendation: Heart healthy  Filed Weights   05/28/16 1933 06/02/16 0400  Weight: 79.4 kg (175 lb) 79 kg (174 lb 2.6 oz)    History of present illness:  Per Dr. Alen Blew Linhart is a 71 y.o. female with a past medical history significant for hypothyroidism, HTN and iron def anemia who presented with hip pain after a fall.  The patient was in her normal state of health until the evening of admission, when she was walking her dog, slipped on the ice, fell on her right hip and had immediate RIGHT hip pain and could not walk.    ED course: -Afebrile, a 73, respirations and pulse ox normal, blood pressure 192/83, desat with pain medicine  -Sodium 138, potassium 3.6, creatinine 0.78, WBCs 14.1 K, hemoglobin 13.8 -INR normal -Radiograph the right hip showed a nondisplaced right intertrochanteric fracture  -Chest x-ray clear -The case was discussed with Dr. Rolena Infante recommended hospitalization admission  plan for surgery 05/29/2016.     There was no preceding dizziness, weakness, lightheadedness or vertigo, no chest discomfort, palpitations, or dyspnea, nor are there any of those symptoms now.  The patient denied active heart issues, angina, or history of MI. She exerts to an equivalent of 4 METS without dyspnea.  She denied history of TIAs/CVAs, CAD, CHF, or DM treated with insulin. She has asthma but uses no inhalers, has no history of OSA, including snoring, daytime drowsiness, apnea.  She has no history of prolonged steroid use and does not use insulin.     Anesthesia Specific concerns: Presence of loose teeth: None Anesthesia problems in past: Yes, unable to tell me what History of bleeding disorder: None  Hospital Course:  Brief Narrative:  Caitlyn [name] a 71 y.o.femalewith a past medical history significant for hypothyroidism, HTN and iron def anemiawho presents with hip pain after a fall. The patient was in hernormal state of health until this evening when she was walking her dog, slipped on the ice, fell on her right hipand had immediate RIGHThip pain and could not walk. While she was in the hospital developed chest pain and pneumonia, currently on antibiotics and chest pain seen by cardiology cardiac catheterization was done and a stent placed on the mid LAD on 1/22.once patient was stabilized cardiac wise she underwent IM fixation of the right femur per Dr. Lyla Glassing on 06/03/2016 without any complications. She was seen by physical therapy. Patient be discharged to a skilled nursing facility. She will need close outpatient follow-up with cardiology.  Non-STEMI /coronary artery disease mid LAD 90% stenosis with drug-eluting stent placement 06/01/2016  per cardiac catheterization. Patient during the hospitalization had complaints of chest pain. Cardiac enzymes which was cycled were elevated. Cardiology consultation was obtained and  patient was seen in consultation by Dr  Marlou Porch. Patient underwent cardiac catheterization after complaints of chest pain and slightly elevated troponin. Patient status post drug-eluting stent to the mid LAD. Patient has been seen by cardiology during the hospitalization and recommend that patient's aspirin and Plavix be continued during surgery due to recent stent placement on 06/01/2016. Echo 1/20=>EF 60% Patient had chest pain with troponin peak of 0.2. Started on IV heparin, beta blockers statin and aspirin, cardiac catheterization done on 1/22. Patient was subsequently placed on oral Coreg and Crestor. ARB started per cardiology for better blood pressure control and for cardiac etiology. Patient underwent orthopedic surgery without any complications. Outpatient follow-up with Dr. [name].  Atypical chest pain Patient with complaints of atypical chest pain underneath left breast on 06/05/2016. Chest wall tenderness to palpation and pain was reproducible. EKG within normal sinus rhythm. Likely musculoskeletal in nature versus costochondritis. Patient was also seen by cardiology who felt patient's atypical chest pain was also musculoskeletal in nature. Per cardiology troponin levels would be of low use since it was previously elevated prior to stent placement and would likely still be elevated or even higher after stent deployment. Patient was placed on Ultram as needed with resolution of atypical chest pain. Outpatient follow-up.   Non displaced intertrochanteric fracture on the right.  Patient had presented after a fall on ice status with right hip pain. Plain films of the right hip and pelvis which were done showed a nondisplaced intertrochanteric fracture on the right. Orthopedics was consulted. Once patient's non-STEMI was stabilized and cardiac clearance was given per cardiology patient subsequently underwent IM fixation right femur 06/03/2016 per Dr. Lyla Glassing. Weightbearing as tolerated with walker. Patient was seen by physical  therapy were recommending skilled nursing facility placement. Patient will follow-up at orthopedics tomorrow Dr. Lyla Glassing post discharge.   HCAP During the hospitalization patient had complaints of chest pain. Workup which was done including chest x-ray was concerning for probable pneumonia. Patient was initially placed empirically on IV vancomycin, cefepime and Levaquin and monitored. Patient improved clinically and IV antibiotics was subsequently narrowed down to oral Levaquin. Patient finished a course of antibiotic treatment during this hospitalization. No further antibiotics required on discharge.  Hypertension. Patient was maintained on a beta blocker during the hospitalization and subsequently ARB was added to her regimen per cardiology. Outpatient follow-up.  Iron deficiency anemia. Hgb -WNL. Ferritin 90s in July. Is intolerant to oral iron, gets iron infusions with Dr. Beryle Beams. H&H stable. Outpatient follow-up.  Hypothyroidism. Continued on home regimen of levothyroxine  H/o asthma.No acute exacerbations. Exam remained unremarkable. Patient was maintained on scheduled nebulizers.  Pancreatic insufficiency Once patient's diet was resumed patient was placed on a home regimen of Creon. Outpatient follow-up.    Procedures:  CT angiogram chest 05/30/2016  Chest x-ray 05/28/2016, 05/30/2016  2-D echo 05/30/2016  Cardiac catheterization 06/01/2016. Dr. [name]--- proximal RCA 20% stenosed, mid LAD lesion 90% stenosed. Postintervention there is a 0% residual stenosis. A drug-eluting stent was successfully placed  Plain films of the right hip and pelvis 05/28/2016  IM fixation right femur per Dr. Lyla Glassing 06/03/2016   Consultations:  Orthopedics: Dr.Swinteck 05/29/2016  Cardiology: Dr.Skains 05/30/2016   Discharge Exam: Vitals:   06/06/16 0555 06/06/16 1300  BP: (!) 145/61 (!) 155/51  Pulse: 81 76  Resp: 18 20  Temp: 98.3 F (36.8 C)  98.1 F (36.7 C)     General: NAD Cardiovascular: RRR Respiratory: CTAB  Discharge Instructions   Discharge Instructions    AMB Referral to Cardiac Rehabilitation - Phase II    Complete by:  As directed    Diagnosis:  Coronary Stents   Amb Referral to Cardiac Rehabilitation    Complete by:  As directed    Diagnosis:   PTCA Coronary Stents NSTEMI     Diet - low sodium heart healthy    Complete by:  As directed    Increase activity slowly    Complete by:  As directed      Current Discharge Medication List    START taking these medications   Details  Amino Acids-Protein Hydrolys (FEEDING SUPPLEMENT, PRO-STAT SUGAR FREE 64,) LIQD Take 30 mLs by mouth 2 (two) times daily. Qty: 900 mL, Refills: 0    aspirin 81 MG chewable tablet Chew 1 tablet (81 mg total) by mouth 2 (two) times daily with a meal.    clopidogrel (PLAVIX) 75 MG tablet Take 1 tablet (75 mg total) by mouth daily with breakfast. Qty: 30 tablet, Refills: 2    feeding supplement (BOOST / RESOURCE BREEZE) LIQD Take 1 Container by mouth 2 (two) times daily between meals. Refills: 0    HYDROcodone-acetaminophen (NORCO) 7.5-325 MG tablet Take 1-2 tablets by mouth every 6 (six) hours as needed for moderate pain. Qty: 20 tablet, Refills: 0    losartan (COZAAR) 25 MG tablet Take 1 tablet (25 mg total) by mouth daily. Qty: 30 tablet, Refills: 2    methocarbamol (ROBAXIN) 500 MG tablet Take 1 tablet (500 mg total) by mouth every 6 (six) hours as needed for muscle spasms. Qty: 30 tablet, Refills: 3    traMADol (ULTRAM) 50 MG tablet Take 2 tablets (100 mg total) by mouth every 6 (six) hours as needed for moderate pain. Qty: 20 tablet, Refills: 0      CONTINUE these medications which have CHANGED   Details  carvedilol (COREG) 12.5 MG tablet Take 1 tablet (12.5 mg total) by mouth 2 (two) times daily with a meal. Qty: 60 tablet, Refills: 3    omeprazole (PRILOSEC) 40 MG capsule Take 1 capsule (40 mg total) by mouth daily. Qty: 30  capsule, Refills: 0      CONTINUE these medications which have NOT CHANGED   Details  levothyroxine (SYNTHROID, LEVOTHROID) 88 MCG tablet Take 88 mcg by mouth daily.    !! lipase/protease/amylase (CREON) 36000 UNITS CPEP capsule Take 36,000 Units by mouth daily.    !! lipase/protease/amylase (CREON) 36000 UNITS CPEP capsule Take 36,000 Units by mouth as needed (may repeat x 2 if first dose is ineffective).    rosuvastatin (CRESTOR) 10 MG tablet Take 10 mg by mouth every other day.      !! - Potential duplicate medications found. Please discuss with provider.    STOP taking these medications     enalapril (VASOTEC) 20 MG tablet      naproxen sodium (ANAPROX) 220 MG tablet        Allergies  Allergen Reactions  . Cortisone Anaphylaxis    Some forms of cortisone  . Monosodium Glutamate Anaphylaxis  . Prednisone Anaphylaxis    confusion  . Shellfish Allergy Anaphylaxis  . Buprenorphine Hcl Nausea And Vomiting and Hypertension    SHAKING  . Morphine And Related Nausea And Vomiting, Other (See Comments) and Hypertension    SHAKING  . Readi-Cat [Barium Sulfate] Diarrhea    Patient does not  want to drink Readi-Cat because it gives her diarrhea  . Demerol [Meperidine]     UNSPECIFIED REACTION   . Sulfa Antibiotics     UNSPECIFIED REACTION   . Benzoic Acid Nausea Only    Contact information for follow-up providers    Swinteck, Horald Pollen, MD. Schedule an appointment as soon as possible for a visit in 2 week(s).   Specialty:  Orthopedic Surgery Why:  For wound re-check Contact information: [address] [place] [address] [phone]        Quay Burow, MD. Schedule an appointment as soon as possible for a visit in 1 week(s).   Specialties:  Cardiology, Radiology Why:  f/u in 1-2 weeks. Contact information: [address] [place] [address] [phone]        MD AT SNF Follow up.   Why:  F/U WITH MD AT SNF.            Contact information for after-discharge care    Destination    [facility] Follow up.   Specialty:  [place] information: [place] [phone]                   The results of significant diagnostics from this hospitalization (including imaging, microbiology, ancillary and laboratory) are listed below for reference.    Significant Diagnostic Studies: Dg Chest 1 View  Result Date: 05/28/2016 CLINICAL DATA:  Status post slip and fall on ice with a hip injury today. EXAM: CHEST 1 VIEW COMPARISON:  PA and lateral chest 11/23/2010. FINDINGS: Lungs are clear. Heart size is normal. Prominent hiatal hernia is seen. No pneumothorax or pleural effusion. Aortic atherosclerosis is identified. No acute bony abnormality. IMPRESSION: No acute disease. Atherosclerosis. Hiatal hernia. Electronically Signed   By: Inge Rise M.D.   On: 05/28/2016 20:34   Dg Chest 2 View  Result Date: 06/05/2016 CLINICAL DATA:  Chest pain EXAM: CHEST  2 VIEW COMPARISON:  05/30/2016 FINDINGS: Cardiac shadow is within normal limits. Large hiatal hernia is again noted and stable. The lungs are clear bilaterally. No acute bony abnormality is seen. IMPRESSION: Large hiatal hernia.  No acute abnormality noted. Electronically Signed   By: Inez Catalina M.D.   On: 06/05/2016 13:33   Ct Angio Chest Pe W Or Wo Contrast  Result Date: 05/30/2016 CLINICAL DATA:  Chest pain. History of hypertension, hiatal hernia, RIGHT kidney mass. EXAM: CT ANGIOGRAPHY CHEST WITH CONTRAST TECHNIQUE: Multidetector CT imaging of the chest was performed using the standard protocol during bolus administration of intravenous contrast. Multiplanar CT image reconstructions and MIPs were obtained to evaluate the vascular anatomy. CONTRAST:  100 cc Isovue 370 COMPARISON:  CT abdomen and pelvis November 27, 2015 and chest radiograph June 07, 2016 at 0704 hours  FINDINGS: CARDIOVASCULAR: Adequate contrast opacification of the pulmonary artery's. Main pulmonary artery is not enlarged. No pulmonary arterial filling defects to the level of the subsegmental branches. Heart size is normal, no right heart strain. No pericardial effusions. Thoracic aorta is normal course and caliber, trace calcific atherosclerosis. MEDIASTINUM/NODES: No lymphadenopathy by CT size criteria. LUNGS/PLEURA: Tracheobronchial tree is patent, no pneumothorax. Hypoenhancing consolidation LEFT lower lobe with a component of atelectasis due to large hiatal hernia. Patchy ground-glass nodules bilateral lungs. Similar calcified possible hamartoma, measures 11 mm RIGHT middle lobe. UPPER ABDOMEN: Large hiatal hernia. Multifocal scarring RIGHT kidney. MUSCULOSKELETAL: Visualized soft tissues and included osseous structures appear normal. Review of  the MIP images confirms the above findings. IMPRESSION: No acute pulmonary embolism. Scattered ground-glass opacities with LEFT lung consolidation most consistent with multifocal pneumonia. Large hiatal hernia with a component LEFT lower lobe atelectasis. Electronically Signed   By: Elon Alas M.D.   On: 05/30/2016 19:43   Dg Chest Port 1 View  Result Date: 05/30/2016 CLINICAL DATA:  Chest pain EXAM: PORTABLE CHEST 1 VIEW COMPARISON:  May 28, 2016 FINDINGS: No pneumothorax. Persistent hiatal hernia. Small layering left effusion and associated atelectasis not excluded. No other interval changes. IMPRESSION: Persistent large hiatal hernia. Small layering left effusion and associated atelectasis not excluded. Electronically Signed   By: Dorise Bullion III M.D   On: 05/30/2016 08:49   Dg C-arm 1-60 Min  Result Date: 06/03/2016 CLINICAL DATA:  Right femoral intramedullary nail placement. Initial encounter. EXAM: OPERATIVE RIGHT HIP WITH PELVIS COMPARISON:  Right hip radiographs performed 05/28/2016 FINDINGS: Two fluoroscopic C-arm images are provided  from the OR. These demonstrate placement of a right-sided dynamic hip screw, transfixing the patient's intertrochanteric fracture in grossly anatomic alignment. No new fractures are seen. The right femoral head remains seated at the acetabulum. IMPRESSION: Status post internal fixation of right femoral fracture in grossly anatomic alignment. Electronically Signed   By: Garald Balding M.D.   On: 06/03/2016 21:03   Dg Hip Port Unilat With Pelvis 1v Right  Result Date: 06/03/2016 CLINICAL DATA:  Status post placement of right femoral intramedullary nail. Initial encounter. EXAM: DG HIP (WITH OR WITHOUT PELVIS) 1V PORT RIGHT COMPARISON:  Right hip radiographs performed 05/28/2016 FINDINGS: The patient is status post placement of an intramedullary nail and hip screw at the proximal right femur, transfixing the patient's right femoral fracture in grossly anatomic alignment. No new fractures are seen. The right femoral head remains seated at the acetabulum. Overlying postoperative soft tissue air is seen. IMPRESSION: Status post internal fixation of right femoral fracture in grossly anatomic alignment Electronically Signed   By: Garald Balding M.D.   On: 06/03/2016 20:26   Dg Hip Operative Unilat W Or W/o Pelvis Right  Result Date: 06/03/2016 CLINICAL DATA:  Right femoral intramedullary nail placement. Initial encounter. EXAM: OPERATIVE RIGHT HIP WITH PELVIS COMPARISON:  Right hip radiographs performed 05/28/2016 FINDINGS: Two fluoroscopic C-arm images are provided from the OR. These demonstrate placement of a right-sided dynamic hip screw, transfixing the patient's intertrochanteric fracture in grossly anatomic alignment. No new fractures are seen. The right femoral head remains seated at the acetabulum. IMPRESSION: Status post internal fixation of right femoral fracture in grossly anatomic alignment. Electronically Signed   By: Garald Balding M.D.   On: 06/03/2016 21:03   Dg Hip Unilat With Pelvis 2-3 Views  Right  Result Date: 05/28/2016 CLINICAL DATA:  Pain after fall. EXAM: DG HIP (WITH OR WITHOUT PELVIS) 2-3V RIGHT COMPARISON:  None. FINDINGS: There is a lucency extending through the inferior trochanter into the femoral neck with increased sclerosis across the intertrochanteric region. The findings are consistent with a subtle nondisplaced intertrochanteric fracture. The left hip and pelvic bones are otherwise normal. IMPRESSION: Subtle nondisplaced intertrochanteric fracture on the right. Electronically Signed   By: Dorise Bullion III M.D   On: 05/28/2016 20:33    Microbiology: Recent Results (from the past 240 hour(s))  Surgical pcr screen     Status: Abnormal   Collection Time: 05/29/16  2:09 AM  Result Value Ref Range Status   MRSA, PCR NEGATIVE NEGATIVE Final   Staphylococcus aureus POSITIVE (A) NEGATIVE Final  Comment:        The Xpert SA Assay (FDA approved for NASAL specimens in patients over 82 years of age), is one component of a comprehensive surveillance program.  Test performance has been validated by [facility] for patients greater than or equal to 32 year old. It is not intended to diagnose infection nor to guide or monitor treatment.      Labs: Basic Metabolic Panel:  Recent Labs Lab 06/02/16 0220 06/03/16 0219 06/04/16 0516 06/05/16 0728 06/05/16 0839 06/06/16 1405  NA 135 136 134* 135  --  134*  K 3.8 3.6 3.5 3.2*  --  4.0  CL 101 101 100* 98*  --  102  CO2 24 27 23 26   --  24  GLUCOSE 109* 144* 151* 143*  --  151*  BUN 8 10 18 18   --  21*  CREATININE 0.71 0.61 0.81 0.75  --  0.54  CALCIUM 8.6* 8.8* 8.6* 8.7*  --  8.5*  MG  --   --   --   --  2.3  --    Liver Function Tests:  Recent Labs Lab 05/31/16 0556 06/01/16 0548  AST 17 16  ALT 10* 10*  ALKPHOS 57 50  BILITOT 1.0 0.8  PROT 5.2* 5.3*  ALBUMIN 2.5* 2.5*   No results for input(s): LIPASE, AMYLASE in the last 168 hours. No results for input(s): AMMONIA in the last 168  hours. CBC:  Recent Labs Lab 06/02/16 0220 06/03/16 0219 06/04/16 0516 06/05/16 0728 06/06/16 0802  WBC 6.6 7.8 11.9* 11.8* 10.7*  HGB 10.9* 12.0 10.7* 10.6* 10.3*  HCT 33.7* 36.5 33.1* 32.7* 31.9*  MCV 88.2 88.2 87.8 88.6 89.9  PLT 185 234 235 241 221   Cardiac Enzymes:  Recent Labs Lab 05/30/16 1815 05/31/16 0018 05/31/16 0556  TROPONINI 0.08* 0.18* 0.20*   BNP: BNP (last 3 results) No results for input(s): BNP in the last 8760 hours.  ProBNP (last 3 results) No results for input(s): PROBNP in the last 8760 hours.  CBG: No results for input(s): GLUCAP in the last 168 hours.     SignedIrine Seal MD.  [facility] 06/06/2016, 3:29 PM

## 2016-06-06 NOTE — Progress Notes (Signed)
Subjective:  No recurrent chest pain overnight.  She had an LAD stent and had recent hip surgery.  She complains of some spasms in her hip.  No shortness of breath at the present time.  Objective:  Vital Signs in the last 24 hours: BP (!) 145/61 (BP Location: Right Arm)   Pulse 81   Temp 98.3 F (36.8 C) (Oral)   Resp 18   Ht 5\' 6"  (1.676 m)   Wt 79 kg (174 lb 2.6 oz)   SpO2 99%   BMI 28.11 kg/m   Physical Exam: Obese pleasant female in no acute distress Lungs:  Clear Cardiac:  Regular rhythm, normal S1 and S2, no S3 Extremities:  No edema present  Intake/Output from previous day: 01/26 0701 - 01/27 0700 In: 870 [P.O.:870] Out: -   Weight Filed Weights   05/28/16 1933 06/02/16 0400  Weight: 79.4 kg (175 lb) 79 kg (174 lb 2.6 oz)    Lab Results: Basic Metabolic Panel:  Recent Labs  06/04/16 0516 06/05/16 0728  NA 134* 135  K 3.5 3.2*  CL 100* 98*  CO2 23 26  GLUCOSE 151* 143*  BUN 18 18  CREATININE 0.81 0.75   CBC:  Recent Labs  06/04/16 0516 06/05/16 0728  WBC 11.9* 11.8*  HGB 10.7* 10.6*  HCT 33.1* 32.7*  MCV 87.8 88.6  PLT 235 241   Telemetry: Normal sinus rhythm personally reviewed  Chest x-ray: Report reviewed from yesterday.  Large hiatal hernia, no acute disease discussed with patient.  Assessment/Plan:  1.  Coronary artery disease with recent LAD stent for unstable angina prior to hip replacement 2.  Recent hip replacement 3.  Obesity  Recommendations:  No recurrent chest pain at this time.  May proceed with physical therapy and ambulation at this point.      Kerry Hough  MD [facility]  06/06/2016, 8:53 AM

## 2016-06-07 DIAGNOSIS — I2510 Atherosclerotic heart disease of native coronary artery without angina pectoris: Secondary | ICD-10-CM

## 2016-06-07 DIAGNOSIS — Z9861 Coronary angioplasty status: Secondary | ICD-10-CM

## 2016-06-07 HISTORY — DX: Atherosclerotic heart disease of native coronary artery without angina pectoris: I25.10

## 2016-06-07 LAB — CBC
HCT: 27.9 % — ABNORMAL LOW (ref 36.0–46.0)
Hemoglobin: 8.9 g/dL — ABNORMAL LOW (ref 12.0–15.0)
MCH: 28.6 pg (ref 26.0–34.0)
MCHC: 31.9 g/dL (ref 30.0–36.0)
MCV: 89.7 fL (ref 78.0–100.0)
PLATELETS: 238 10*3/uL (ref 150–400)
RBC: 3.11 MIL/uL — ABNORMAL LOW (ref 3.87–5.11)
RDW: 14 % (ref 11.5–15.5)
WBC: 8.3 10*3/uL (ref 4.0–10.5)

## 2016-06-07 LAB — BASIC METABOLIC PANEL
ANION GAP: 11 (ref 5–15)
BUN: 18 mg/dL (ref 6–20)
CALCIUM: 8.4 mg/dL — AB (ref 8.9–10.3)
CO2: 25 mmol/L (ref 22–32)
CREATININE: 0.57 mg/dL (ref 0.44–1.00)
Chloride: 101 mmol/L (ref 101–111)
GLUCOSE: 124 mg/dL — AB (ref 65–99)
Potassium: 3.6 mmol/L (ref 3.5–5.1)
Sodium: 137 mmol/L (ref 135–145)

## 2016-06-07 MED ORDER — POTASSIUM CHLORIDE CRYS ER 20 MEQ PO TBCR
40.0000 meq | EXTENDED_RELEASE_TABLET | Freq: Once | ORAL | Status: AC
  Administered 2016-06-07: 40 meq via ORAL
  Filled 2016-06-07: qty 2

## 2016-06-07 NOTE — Progress Notes (Signed)
PROGRESS NOTE    Caitlyn [name]  [id number] DOB: July 18, 1945 DOA: 05/28/2016 PCP: Henrine Screws, MD   No changes to discharge summary dictated 06/06/2016.   Brief Narrative:  Caitlyn [name] a 71 y.o.femalewith a past medical history significant for hypothyroidism, HTN and iron def anemiawho presents with hip pain after a fall. The patient was in hernormal state of health until this evening when she was walking her dog, slipped on the ice, fell on her right hipand had immediate RIGHThip pain and could not walk.  While she was in the hospital developed chest pain and pneumonia, currently on antibiotics and chest pain seen by cardiology cardiac catheterization was done and a stent placed on the mid LAD on 1/22.    Assessment & Plan:   Principal Problem:   Closed fracture of right hip (HCC) Active Problems:   Iron deficiency anemia   Essential hypertension   Hypothyroidism, acquired   Status post insertion of drug-eluting stent into left anterior descending (LAD) artery   NSTEMI (non-ST elevated myocardial infarction) ([place])   Displaced intertrochanteric fracture of right femur, initial encounter for closed fracture ([place])   Postop check   CAD (coronary artery disease), native coronary artery  Non-STEMI /coronary artery disease mid LAD 90% stenosis with drug-eluting stent placement 06/01/2016 per cardiac catheterization. Patient would recent cardiac catheterization after complaints of chest pain and slightly elevated troponin. Patient status post drug-eluting stent to the mid LAD. Patient has been seen by cardiology during the hospitalization and recommend that patient's aspirin and Plavix be continued during surgery due to recent stent placement on 06/01/2016. Echo 1/20=> EF 60% Patient had chest pain with troponin peak of 0.2. Started on IV heparin, beta blockers statin and aspirin, cardiac catheterization done on 1/22. Continue other cardiac medications of Crestor,  Coreg. ARB started per cardiology for better blood pressure control and for cardiac etiology. Patient status post surgery. Outpatient follow-up with Dr. [name].  Atypical chest pain Patient with complaints of atypical chest pain underneath left breast. Chest wall tenderness to palpation and pain is reproducible. EKG within normal sinus rhythm. Likely musculoskeletal in nature versus costochondritis. Improved on ultram.   Non displaced intertrochanteric fracture on the right.  Patient status post IM fixation right femur 06/03/2016 per Dr. Lyla Glassing. Weightbearing as tolerated with walker. PT OT ff. Per orthopedics.  HCAP Was on vancomycin, cefepime and levofloxacin. Cefepime discontinued, currently on vancomycin and levofloxacin. Antibiotics have been narrowed, by discontinuing vancomycin and changing Levaquin. Patient has finished course of antibiotics. Continue supportive management with bronchodilators and mucolytics.  Hypertension. Continue BB and statin and ARB.  Iron deficiency anemia. Hgb -WNL. Ferritin 90s in July. Is intolerant to oral iron, gets iron infusions with Dr. Beryle Beams. H&H stable. Follow postoperatively.  Hypothyroidism. Continue levothyroxine  H/o asthma.No acute exacerbations. Exam is unremarkable. Continue scheduled nebulizers.  Pancreatic insufficiency Patient's diet has been resumed as well as her Creon.    DVT prophylaxis: SCDs. Per orthopedics. Code Status: Full Family Communication: Updated patient, No family at bedside.  Disposition Plan:  SNF today.    Consultants:   Orthopedics: Dr.Swinteck 05/29/2016  Cardiology: Dr.Skains 05/30/2016  Procedures:   CT angiogram chest 05/30/2016  Chest x-ray 05/28/2016, 05/30/2016  2-D echo 05/30/2016  Cardiac catheterization 06/01/2016. Dr. [name]--- proximal RCA 20% stenosed, mid LAD lesion 90% stenosed. Postintervention there is a 0% residual stenosis. A drug-eluting stent was successfully  placed  Plain films of the right hip and pelvis 05/28/2016  IM fixation right femur per Dr. Lyla Glassing 06/03/2016  Antimicrobials:   IV cefepime 05/30/2016>>>> 06/01/2016  IV Levaquin 05/30/2016>>>> oral Levaquin 06/04/2016  IV vancomycin 05/30/2016>>>>>>06/04/2016   Subjective: Patient denies CP. No SOB. Upset on not being able to get to SNF yesterday.   Objective: Vitals:   06/06/16 2013 06/07/16 0100 06/07/16 0354 06/07/16 0812  BP: (!) 137/54  (!) 145/68 (!) 145/89  Pulse: 82  75 90  Resp: 18 18 18    Temp: 97.8 F (36.6 C)  98.2 F (36.8 C)   TempSrc: Oral  Oral   SpO2: 96%  95% 99%  Weight:      Height:        Intake/Output Summary (Last 24 hours) at 06/07/16 1046 Last data filed at 06/07/16 0354  Gross per 24 hour  Intake              810 ml  Output              200 ml  Net              610 ml   Filed Weights   05/28/16 1933 06/02/16 0400  Weight: 79.4 kg (175 lb) 79 kg (174 lb 2.6 oz)    Examination:  General exam: Appears calm and comfortable  Respiratory system: Clear to auscultation. Respiratory effort normal. Cardiovascular system: S1 & S2 heard, RRR. No JVD, murmurs, rubs, gallops or clicks. No pedal edema. Gastrointestinal system: Abdomen is nondistended, soft and nontender. No organomegaly or masses felt. Normal bowel sounds heard. Central nervous system: Alert and oriented. No focal neurological deficits. Extremities: Symmetric 5 x 5 power. Skin: No rashes, lesions or ulcers Psychiatry: Judgement and insight appear normal. Mood & affect appropriate.     Data Reviewed: I have personally reviewed following labs and imaging studies  CBC:  Recent Labs Lab 06/03/16 0219 06/04/16 0516 06/05/16 0728 06/06/16 0802 06/07/16 0342  WBC 7.8 11.9* 11.8* 10.7* 8.3  HGB 12.0 10.7* 10.6* 10.3* 8.9*  HCT 36.5 33.1* 32.7* 31.9* 27.9*  MCV 88.2 87.8 88.6 89.9 89.7  PLT 234 235 241 221 [id number]   Basic Metabolic Panel:  Recent Labs Lab 06/03/16 0219  06/04/16 0516 06/05/16 0728 06/05/16 0839 06/06/16 1405 06/07/16 0342  NA 136 134* 135  --  134* 137  K 3.6 3.5 3.2*  --  4.0 3.6  CL 101 100* 98*  --  102 101  CO2 27 23 26   --  24 25  GLUCOSE 144* 151* 143*  --  151* 124*  BUN 10 18 18   --  21* 18  CREATININE 0.61 0.81 0.75  --  0.54 0.57  CALCIUM 8.8* 8.6* 8.7*  --  8.5* 8.4*  MG  --   --   --  2.3  --   --    GFR: Estimated Creatinine Clearance: 67.4 mL/min (by C-G formula based on SCr of 0.57 mg/dL). Liver Function Tests:  Recent Labs Lab 06/01/16 0548  AST 16  ALT 10*  ALKPHOS 50  BILITOT 0.8  PROT 5.3*  ALBUMIN 2.5*   No results for input(s): LIPASE, AMYLASE in the last 168 hours. No results for input(s): AMMONIA in the last 168 hours. Coagulation Profile: No results for input(s): INR, PROTIME in the last 168 hours. Cardiac Enzymes: No results for input(s): CKTOTAL, CKMB, CKMBINDEX, TROPONINI in the last 168 hours. BNP (last 3 results) No results for input(s): PROBNP in the last 8760 hours. HbA1C: No results for input(s): HGBA1C in the last 72 hours. CBG: No results for input(s): GLUCAP  in the last 168 hours. Lipid Profile: No results for input(s): CHOL, HDL, LDLCALC, TRIG, CHOLHDL, LDLDIRECT in the last 72 hours. Thyroid Function Tests: No results for input(s): TSH, T4TOTAL, FREET4, T3FREE, THYROIDAB in the last 72 hours. Anemia Panel: No results for input(s): VITAMINB12, FOLATE, FERRITIN, TIBC, IRON, RETICCTPCT in the last 72 hours. Sepsis Labs: No results for input(s): PROCALCITON, LATICACIDVEN in the last 168 hours.  Recent Results (from the past 240 hour(s))  Surgical pcr screen     Status: Abnormal   Collection Time: 05/29/16  2:09 AM  Result Value Ref Range Status   MRSA, PCR NEGATIVE NEGATIVE Final   Staphylococcus aureus POSITIVE (A) NEGATIVE Final    Comment:        The Xpert SA Assay (FDA approved for NASAL specimens in patients over 63 years of age), is one component of a comprehensive  surveillance program.  Test performance has been validated by [facility] for patients greater than or equal to 13 year old. It is not intended to diagnose infection nor to guide or monitor treatment.          Radiology Studies: Dg Chest 2 View  Result Date: 06/05/2016 CLINICAL DATA:  Chest pain EXAM: CHEST  2 VIEW COMPARISON:  05/30/2016 FINDINGS: Cardiac shadow is within normal limits. Large hiatal hernia is again noted and stable. The lungs are clear bilaterally. No acute bony abnormality is seen. IMPRESSION: Large hiatal hernia.  No acute abnormality noted. Electronically Signed   By: Inez Catalina M.D.   On: 06/05/2016 13:33        Scheduled Meds: . aspirin  81 mg Oral BID WC  . carvedilol  12.5 mg Oral BID WC  . clopidogrel  75 mg Oral Q breakfast  . feeding supplement  1 Container Oral BID BM  . feeding supplement (PRO-STAT SUGAR FREE 64)  30 mL Oral BID  . levofloxacin  750 mg Oral Daily  . levothyroxine  88 mcg Oral QAC breakfast  . lipase/protease/amylase  36,000 Units Oral Daily  . losartan  25 mg Oral Daily  . mupirocin ointment   Nasal BID  . pantoprazole  40 mg Oral BID  . potassium chloride  40 mEq Oral Once  . rosuvastatin  10 mg Oral QODAY   Continuous Infusions:   LOS: 10 days    Time spent: 81 mins    [name],DANIEL, MD [facility] Pager [phone]  If 7PM-7AM, please contact night-coverage [email or web address] Password [facility] 06/07/2016, 10:46 AM

## 2016-06-07 NOTE — Progress Notes (Signed)
Subjective:  No recurrent chest pain overnight.  She had an LAD stent and had recent hip surgery.  Hip feels better today and ready to go to [facility] when bed available.  No shortness of breath.    Objective:  Vital Signs in the last 24 hours: BP (!) 145/89 (BP Location: Right Arm)   Pulse 90   Temp 98.2 F (36.8 C) (Oral)   Resp 18   Ht 5\' 6"  (1.676 m)   Wt 79 kg (174 lb 2.6 oz)   SpO2 99%   BMI 28.11 kg/m   Physical Exam: Obese pleasant female in no acute distress Lungs:  Clear Cardiac:  Regular rhythm, normal S1 and S2, no S3 Extremities:  No edema present  Intake/Output from previous day: 01/27 0701 - 01/28 0700 In: 1020 [P.O.:1020] Out: 200 [Urine:200]  Weight Filed Weights   05/28/16 1933 06/02/16 0400  Weight: 79.4 kg (175 lb) 79 kg (174 lb 2.6 oz)    Lab Results: Basic Metabolic Panel:  Recent Labs  06/06/16 1405 06/07/16 0342  NA 134* 137  K 4.0 3.6  CL 102 101  CO2 24 25  GLUCOSE 151* 124*  BUN 21* 18  CREATININE 0.54 0.57   CBC:  Recent Labs  06/06/16 0802 06/07/16 0342  WBC 10.7* 8.3  HGB 10.3* 8.9*  HCT 31.9* 27.9*  MCV 89.9 89.7  PLT 221 238   Telemetry: Normal sinus rhythm with PVCs personally reviewed   Assessment/Plan:  1.  Coronary artery disease with recent LAD stent for unstable angina prior to hip replacement-no recurrent ischemic chest pain. 2.  Recent hip replacement 3.  Obesity  Recommendations:  She should continue Plavix and aspirin until further instructions from cardiology about discontinuation.  Okay for discharge from cardiovascular viewpoint and should follow-up with cardiology when she gets out of the nursing home.      Kerry Hough  MD [facility] Card[facility]  06/07/2016, 9:20 AM

## 2016-06-07 NOTE — Progress Notes (Signed)
Discharge Summary sent to facility. Transportation has been arranged via [facility] to [facility]. EMATLA put on chart. No other services requested at this time. CSW to sign off.   Lucius Conn, [place] Worker [facility] Ph: [phone]

## 2016-06-07 NOTE — Progress Notes (Signed)
Reviewed discharge instructions/medications with patient.  Answered her questions.  Called report to [facility] at [facility] and spoke with [place].  Transport is here for patient.

## 2016-06-07 NOTE — Discharge Summary (Signed)
Physician Discharge Summary  Caitlyn [name] [id number] DOB: 10-20-1945 DOA: 05/28/2016  PCP: Caitlyn Screws, MD  Admit date: 05/28/2016 Discharge date: 06/07/2016  Time spent: 65 minutes  Recommendations for Outpatient Follow-up:  1. Follow-up with Dr. Gwenlyn [name], cardiology 1-2 weeks postdischarge. 2. Follow-up with Dr. Lyla [name], orthopedics in 2 weeks for wound recheck. 3. Follow-up with M.D. at skilled nursing facility. Patient will need a basic metabolic profile done in 1 week to follow-up on electrolytes and renal function.   Discharge Diagnoses:  Principal Problem:   Closed fracture of right hip ([place]) Active Problems:   Iron deficiency anemia   Essential hypertension   Hypothyroidism, acquired   Status post insertion of drug-eluting stent into left anterior descending (LAD) artery   NSTEMI (non-ST elevated myocardial infarction) ([place])   Displaced intertrochanteric fracture of right femur, initial encounter for closed fracture ([place])   Postop check   CAD (coronary artery disease), native coronary artery   Discharge Condition: Stable and improved  Diet recommendation: Heart healthy  Filed Weights   05/28/16 1933 06/02/16 0400  Weight: 79.4 kg (175 lb) 79 kg (174 lb 2.6 oz)    History of present illness:  Per Dr. Alen Blew Rodenbeck is a 71 y.o. female with a past medical history significant for hypothyroidism, HTN and iron def anemia who presented with hip pain after a fall.  The patient was in her normal state of health until the evening of admission, when she was walking her dog, slipped on the ice, fell on her right hip and had immediate RIGHT hip pain and could not walk.    ED course: -Afebrile, a 73, respirations and pulse ox normal, blood pressure 192/83, desat with pain medicine  -Sodium 138, potassium 3.6, creatinine 0.78, WBCs 14.1 K, hemoglobin 13.8 -INR normal -Radiograph the right hip showed a nondisplaced right intertrochanteric fracture  -Chest x-ray  clear -The case was discussed with Dr. Rolena Infante recommended hospitalization admission plan for surgery 05/29/2016.     There was no preceding dizziness, weakness, lightheadedness or vertigo, no chest discomfort, palpitations, or dyspnea, nor are there any of those symptoms now.  The patient denied active heart issues, angina, or history of MI. She exerts to an equivalent of 4 METS without dyspnea.  She denied history of TIAs/CVAs, CAD, CHF, or DM treated with insulin. She has asthma but uses no inhalers, has no history of OSA, including snoring, daytime drowsiness, apnea.  She has no history of prolonged steroid use and does not use insulin.     Anesthesia Specific concerns: Presence of loose teeth: None Anesthesia problems in past: Yes, unable to tell me what History of bleeding disorder: None  Hospital Course:  Brief Narrative:  Caitlyn Amickis a 71 y.o.femalewith a past medical history significant for hypothyroidism, HTN and iron def anemiawho presents with hip pain after a fall. The patient was in hernormal state of health until this evening when she was walking her dog, slipped on the ice, fell on her right hipand had immediate RIGHThip pain and could not walk. While she was in the hospital developed chest pain and pneumonia, currently on antibiotics and chest pain seen by cardiology cardiac catheterization was done and a stent placed on the mid LAD on 1/22.once patient was stabilized cardiac wise she underwent IM fixation of the right femur per Dr. Lyla [name] on 06/03/2016 without any complications. She was seen by physical therapy. Patient be discharged to a skilled nursing facility. She will need close outpatient follow-up with cardiology.  Non-STEMI /coronary  artery disease mid LAD 90% stenosis with drug-eluting stent placement 06/01/2016 per cardiac catheterization. Patient during the hospitalization had complaints of chest pain. Cardiac enzymes which was cycled were  elevated. Cardiology consultation was obtained and  patient was seen in consultation by Dr Marlou Porch. Patient underwent cardiac catheterization after complaints of chest pain and slightly elevated troponin. Patient status post drug-eluting stent to the mid LAD. Patient has been seen by cardiology during the hospitalization and recommend that patient's aspirin and Plavix be continued during surgery due to recent stent placement on 06/01/2016. Echo 1/20=>EF 60% Patient had chest pain with troponin peak of 0.2. Started on IV heparin, beta blockers statin and aspirin, cardiac catheterization done on 1/22. Patient was subsequently placed on oral Coreg and Crestor. ARB started per cardiology for better blood pressure control and for cardiac etiology. Patient underwent orthopedic surgery without any complications. Outpatient follow-up with Dr. Gwenlyn [name].  Atypical chest pain Patient with complaints of atypical chest pain underneath left breast on 06/05/2016. Chest wall tenderness to palpation and pain was reproducible. EKG within normal sinus rhythm. Likely musculoskeletal in nature versus costochondritis. Patient was also seen by cardiology who felt patient's atypical chest pain was also musculoskeletal in nature. Per cardiology troponin levels would be of low use since it was previously elevated prior to stent placement and would likely still be elevated or even higher after stent deployment. Patient was placed on Ultram as needed with resolution of atypical chest pain. Outpatient follow-up.   Non displaced intertrochanteric fracture on the right.  Patient had presented after a fall on ice status with right hip pain. Plain films of the right hip and pelvis which were done showed a nondisplaced intertrochanteric fracture on the right. Orthopedics was consulted. Once patient's non-STEMI was stabilized and cardiac clearance was given per cardiology patient subsequently underwent IM fixation right femur 06/03/2016  per Dr. Lyla [name]. Weightbearing as tolerated with walker. Patient was seen by physical therapy were recommending skilled nursing facility placement. Patient will follow-up at orthopedics tomorrow Dr. Lyla [name] post discharge.   HCAP During the hospitalization patient had complaints of chest pain. Workup which was done including chest x-ray was concerning for probable pneumonia. Patient was initially placed empirically on IV vancomycin, cefepime and Levaquin and monitored. Patient improved clinically and IV antibiotics was subsequently narrowed down to oral Levaquin. Patient finished a course of antibiotic treatment during this hospitalization. No further antibiotics required on discharge.  Hypertension. Patient was maintained on a beta blocker during the hospitalization and subsequently ARB was added to her regimen per cardiology. Outpatient follow-up.  Iron deficiency anemia. Hgb -WNL. Ferritin 90s in July. Is intolerant to oral iron, gets iron infusions with Dr. Beryle Beams. H&H stable. Outpatient follow-up.  Hypothyroidism. Continued on home regimen of levothyroxine  H/o asthma.No acute exacerbations. Exam remained unremarkable. Patient was maintained on scheduled nebulizers.  Pancreatic insufficiency Once patient's diet was resumed patient was placed on a home regimen of Creon. Outpatient follow-up.    Procedures:  CT angiogram chest 05/30/2016  Chest x-ray 05/28/2016, 05/30/2016  2-D echo 05/30/2016  Cardiac catheterization 06/01/2016. Dr. Gwenlyn [name]--- proximal RCA 20% stenosed, mid LAD lesion 90% stenosed. Postintervention there is a 0% residual stenosis. A drug-eluting stent was successfully placed  Plain films of the right hip and pelvis 05/28/2016  IM fixation right femur per Dr. Lyla [name] 06/03/2016   Consultations:  Orthopedics: Dr.Swinteck 05/29/2016  Cardiology: Dr.Skains 05/30/2016   Discharge Exam: Vitals:   06/07/16 0354 06/07/16 0812  BP: (!) 145/68  (!) 145/89  Pulse:  75 90  Resp: 18   Temp: 98.2 F (36.8 C)     General: NAD Cardiovascular: RRR Respiratory: CTAB  Discharge Instructions   Discharge Instructions    AMB Referral to Cardiac Rehabilitation - Phase II    Complete by:  As directed    Diagnosis:  Coronary Stents   Amb Referral to Cardiac Rehabilitation    Complete by:  As directed    Diagnosis:   PTCA Coronary Stents NSTEMI     Diet - low sodium heart healthy    Complete by:  As directed    Increase activity slowly    Complete by:  As directed      Current Discharge Medication List    START taking these medications   Details  Amino Acids-Protein Hydrolys (FEEDING SUPPLEMENT, PRO-STAT SUGAR FREE 64,) LIQD Take 30 mLs by mouth 2 (two) times daily. Qty: 900 mL, Refills: 0    aspirin 81 MG chewable tablet Chew 1 tablet (81 mg total) by mouth 2 (two) times daily with a meal.    clopidogrel (PLAVIX) 75 MG tablet Take 1 tablet (75 mg total) by mouth daily with breakfast. Qty: 30 tablet, Refills: 2    feeding supplement (BOOST / RESOURCE BREEZE) LIQD Take 1 Container by mouth 2 (two) times daily between meals. Refills: 0    HYDROcodone-acetaminophen (NORCO) 7.5-325 MG tablet Take 1-2 tablets by mouth every 6 (six) hours as needed for moderate pain. Qty: 20 tablet, Refills: 0    losartan (COZAAR) 25 MG tablet Take 1 tablet (25 mg total) by mouth daily. Qty: 30 tablet, Refills: 2    methocarbamol (ROBAXIN) 500 MG tablet Take 1 tablet (500 mg total) by mouth every 6 (six) hours as needed for muscle spasms. Qty: 30 tablet, Refills: 3    traMADol (ULTRAM) 50 MG tablet Take 2 tablets (100 mg total) by mouth every 6 (six) hours as needed for moderate pain. Qty: 20 tablet, Refills: 0      CONTINUE these medications which have CHANGED   Details  carvedilol (COREG) 12.5 MG tablet Take 1 tablet (12.5 mg total) by mouth 2 (two) times daily with a meal. Qty: 60 tablet, Refills: 3    omeprazole (PRILOSEC) 40 MG  capsule Take 1 capsule (40 mg total) by mouth daily. Qty: 30 capsule, Refills: 0      CONTINUE these medications which have NOT CHANGED   Details  levothyroxine (SYNTHROID, LEVOTHROID) 88 MCG tablet Take 88 mcg by mouth daily.    !! lipase/protease/amylase (CREON) 36000 UNITS CPEP capsule Take 36,000 Units by mouth daily.    !! lipase/protease/amylase (CREON) 36000 UNITS CPEP capsule Take 36,000 Units by mouth as needed (may repeat x 2 if first dose is ineffective).    rosuvastatin (CRESTOR) 10 MG tablet Take 10 mg by mouth every other day.      !! - Potential duplicate medications [name]. Please discuss with provider.    STOP taking these medications     enalapril (VASOTEC) 20 MG tablet      naproxen sodium (ANAPROX) 220 MG tablet        Allergies  Allergen Reactions  . Cortisone Anaphylaxis    Some forms of cortisone  . Monosodium Glutamate Anaphylaxis  . Prednisone Anaphylaxis    confusion  . Shellfish Allergy Anaphylaxis  . Buprenorphine Hcl Nausea And Vomiting and Hypertension    SHAKING  . Morphine And Related Nausea And Vomiting, Other (See Comments) and Hypertension    SHAKING  . Readi-Cat [Barium  Sulfate] Diarrhea    Patient does not want to drink Readi-Cat because it gives her diarrhea  . Demerol [Meperidine]     UNSPECIFIED REACTION   . Sulfa Antibiotics     UNSPECIFIED REACTION   . Benzoic Acid Nausea Only    Contact information for follow-up providers    Swinteck, Horald Pollen, MD. Schedule an appointment as soon as possible for a visit in 2 week(s).   Specialty:  Orthopedic Surgery Why:  For wound re-check Contact information: [address] [place] [address] [id number]        Quay Burow, MD. Schedule an appointment as soon as possible for a visit in 1 week(s).   Specialties:  Cardiology, Radiology Why:  f/u in 1-2 weeks. Contact information: [address] [place] [address] [phone]        MD  AT SNF Follow up.   Why:  F/U WITH MD AT SNF.           Contact information for after-discharge care    Destination    [facility] Follow up.   Specialty:  [place] information: [place] [phone]                   The results of significant diagnostics from this hospitalization (including imaging, microbiology, ancillary and laboratory) are listed below for reference.    Significant Diagnostic Studies: Dg Chest 1 View  Result Date: 05/28/2016 CLINICAL DATA:  Status post slip and fall on ice with a hip injury today. EXAM: CHEST 1 VIEW COMPARISON:  PA and lateral chest 11/23/2010. FINDINGS: Lungs are clear. Heart size is normal. Prominent hiatal hernia is seen. No pneumothorax or pleural effusion. Aortic atherosclerosis is identified. No acute bony abnormality. IMPRESSION: No acute disease. Atherosclerosis. Hiatal hernia. Electronically Signed   By: Inge Rise M.D.   On: 05/28/2016 20:34   Dg Chest 2 View  Result Date: 06/05/2016 CLINICAL DATA:  Chest pain EXAM: CHEST  2 VIEW COMPARISON:  05/30/2016 FINDINGS: Cardiac shadow is within normal limits. Large hiatal hernia is again noted and stable. The lungs are clear bilaterally. No acute bony abnormality is seen. IMPRESSION: Large hiatal hernia.  No acute abnormality noted. Electronically Signed   By: Inez Catalina M.D.   On: 06/05/2016 13:33   Ct Angio Chest Pe W Or Wo Contrast  Result Date: 05/30/2016 CLINICAL DATA:  Chest pain. History of hypertension, hiatal hernia, RIGHT kidney mass. EXAM: CT ANGIOGRAPHY CHEST WITH CONTRAST TECHNIQUE: Multidetector CT imaging of the chest was performed using the standard protocol during bolus administration of intravenous contrast. Multiplanar CT image reconstructions and MIPs were obtained to evaluate the vascular anatomy. CONTRAST:  100 cc Isovue 370 COMPARISON:  CT abdomen and pelvis November 27, 2015 and chest radiograph June 07, 2016 at 0704 hours FINDINGS: CARDIOVASCULAR: Adequate contrast opacification of the pulmonary artery's. Main pulmonary artery is not enlarged. No pulmonary arterial filling defects to the level of the subsegmental branches. Heart size is normal, no right heart strain. No pericardial effusions. Thoracic aorta is normal course and caliber, trace calcific atherosclerosis. MEDIASTINUM/NODES: No lymphadenopathy by CT size criteria. LUNGS/PLEURA: Tracheobronchial tree is patent, no pneumothorax. Hypoenhancing consolidation LEFT lower lobe with a component of atelectasis due to large hiatal hernia. Patchy ground-glass nodules bilateral lungs. Similar calcified possible hamartoma, measures 11 mm RIGHT middle lobe. UPPER ABDOMEN: Large hiatal hernia. Multifocal scarring RIGHT kidney. MUSCULOSKELETAL: Visualized soft tissues  and included osseous structures appear normal. Review of the MIP images confirms the above findings. IMPRESSION: No acute pulmonary embolism. Scattered ground-glass opacities with LEFT lung consolidation most consistent with multifocal pneumonia. Large hiatal hernia with a component LEFT lower lobe atelectasis. Electronically Signed   By: Elon Alas M.D.   On: 05/30/2016 19:43   Dg Chest Port 1 View  Result Date: 05/30/2016 CLINICAL DATA:  Chest pain EXAM: PORTABLE CHEST 1 VIEW COMPARISON:  May 28, 2016 FINDINGS: No pneumothorax. Persistent hiatal hernia. Small layering left effusion and associated atelectasis not excluded. No other interval changes. IMPRESSION: Persistent large hiatal hernia. Small layering left effusion and associated atelectasis not excluded. Electronically Signed   By: Dorise Bullion III M.D   On: 05/30/2016 08:49   Dg C-arm 1-60 Min  Result Date: 06/03/2016 CLINICAL DATA:  Right femoral intramedullary nail placement. Initial encounter. EXAM: OPERATIVE RIGHT HIP WITH PELVIS COMPARISON:  Right hip radiographs performed  05/28/2016 FINDINGS: Two fluoroscopic C-arm images are provided from the OR. These demonstrate placement of a right-sided dynamic hip screw, transfixing the patient's intertrochanteric fracture in grossly anatomic alignment. No new fractures are seen. The right femoral head remains seated at the acetabulum. IMPRESSION: Status post internal fixation of right femoral fracture in grossly anatomic alignment. Electronically Signed   By: Garald Balding M.D.   On: 06/03/2016 21:03   Dg Hip Port Unilat With Pelvis 1v Right  Result Date: 06/03/2016 CLINICAL DATA:  Status post placement of right femoral intramedullary nail. Initial encounter. EXAM: DG HIP (WITH OR WITHOUT PELVIS) 1V PORT RIGHT COMPARISON:  Right hip radiographs performed 05/28/2016 FINDINGS: The patient is status post placement of an intramedullary nail and hip screw at the proximal right femur, transfixing the patient's right femoral fracture in grossly anatomic alignment. No new fractures are seen. The right femoral head remains seated at the acetabulum. Overlying postoperative soft tissue air is seen. IMPRESSION: Status post internal fixation of right femoral fracture in grossly anatomic alignment Electronically Signed   By: Garald Balding M.D.   On: 06/03/2016 20:26   Dg Hip Operative Unilat W Or W/o Pelvis Right  Result Date: 06/03/2016 CLINICAL DATA:  Right femoral intramedullary nail placement. Initial encounter. EXAM: OPERATIVE RIGHT HIP WITH PELVIS COMPARISON:  Right hip radiographs performed 05/28/2016 FINDINGS: Two fluoroscopic C-arm images are provided from the OR. These demonstrate placement of a right-sided dynamic hip screw, transfixing the patient's intertrochanteric fracture in grossly anatomic alignment. No new fractures are seen. The right femoral head remains seated at the acetabulum. IMPRESSION: Status post internal fixation of right femoral fracture in grossly anatomic alignment. Electronically Signed   By: Garald Balding M.D.    On: 06/03/2016 21:03   Dg Hip Unilat With Pelvis 2-3 Views Right  Result Date: 05/28/2016 CLINICAL DATA:  Pain after fall. EXAM: DG HIP (WITH OR WITHOUT PELVIS) 2-3V RIGHT COMPARISON:  None. FINDINGS: There is a lucency extending through the inferior trochanter into the femoral neck with increased sclerosis across the intertrochanteric region. The findings are consistent with a subtle nondisplaced intertrochanteric fracture. The left hip and pelvic bones are otherwise normal. IMPRESSION: Subtle nondisplaced intertrochanteric fracture on the right. Electronically Signed   By: Dorise Bullion III M.D   On: 05/28/2016 20:33    Microbiology: Recent Results (from the past 240 hour(s))  Surgical pcr screen     Status: Abnormal   Collection Time: 05/29/16  2:09 AM  Result Value Ref Range Status   MRSA, PCR NEGATIVE NEGATIVE Final  Staphylococcus aureus POSITIVE (A) NEGATIVE Final    Comment:        The Xpert SA Assay (FDA approved for NASAL specimens in patients over 61 years of age), is one component of a comprehensive surveillance program.  Test performance has been validated by [facility] for patients greater than or equal to 72 year old. It is not intended to diagnose infection nor to guide or monitor treatment.      Labs: Basic Metabolic Panel:  Recent Labs Lab 06/03/16 0219 06/04/16 0516 06/05/16 0728 06/05/16 0839 06/06/16 1405 06/07/16 0342  NA 136 134* 135  --  134* 137  K 3.6 3.5 3.2*  --  4.0 3.6  CL 101 100* 98*  --  102 101  CO2 27 23 26   --  24 25  GLUCOSE 144* 151* 143*  --  151* 124*  BUN 10 18 18   --  21* 18  CREATININE 0.61 0.81 0.75  --  0.54 0.57  CALCIUM 8.8* 8.6* 8.7*  --  8.5* 8.4*  MG  --   --   --  2.3  --   --    Liver Function Tests:  Recent Labs Lab 06/01/16 0548  AST 16  ALT 10*  ALKPHOS 50  BILITOT 0.8  PROT 5.3*  ALBUMIN 2.5*   No results for input(s): LIPASE, AMYLASE in the last 168 hours. No results for input(s): AMMONIA in  the last 168 hours. CBC:  Recent Labs Lab 06/03/16 0219 06/04/16 0516 06/05/16 0728 06/06/16 0802 06/07/16 0342  WBC 7.8 11.9* 11.8* 10.7* 8.3  HGB 12.0 10.7* 10.6* 10.3* 8.9*  HCT 36.5 33.1* 32.7* 31.9* 27.9*  MCV 88.2 87.8 88.6 89.9 89.7  PLT 234 235 241 221 238   Cardiac Enzymes: No results for input(s): CKTOTAL, CKMB, CKMBINDEX, TROPONINI in the last 168 hours. BNP: BNP (last 3 results) No results for input(s): BNP in the last 8760 hours.  ProBNP (last 3 results) No results for input(s): PROBNP in the last 8760 hours.  CBG: No results for input(s): GLUCAP in the last 168 hours.     SignedIrine Seal MD.  [facility] 06/07/2016, 11:13 AM

## 2016-06-10 MED FILL — Bivalirudin For IV Soln 250 MG: INTRAVENOUS | Qty: 250 | Status: AC

## 2016-06-15 ENCOUNTER — Encounter: Payer: Self-pay | Admitting: Gastroenterology

## 2016-06-15 ENCOUNTER — Ambulatory Visit (INDEPENDENT_AMBULATORY_CARE_PROVIDER_SITE_OTHER): Payer: Medicare Other | Admitting: Gastroenterology

## 2016-06-15 VITALS — BP 150/80 | HR 84 | Ht 64.0 in | Wt 161.1 lb

## 2016-06-15 DIAGNOSIS — R159 Full incontinence of feces: Secondary | ICD-10-CM

## 2016-06-15 DIAGNOSIS — D508 Other iron deficiency anemias: Secondary | ICD-10-CM | POA: Diagnosis not present

## 2016-06-15 NOTE — Patient Instructions (Signed)
If you are age 73 or older, your body mass index should be between 23-30. Your Body mass index is 27.66 kg/m. If this is out of the aforementioned range listed, please consider follow up with your Primary Care Provider.  If you are age 24 or younger, your body mass index should be between 19-25. Your Body mass index is 27.66 kg/m. If this is out of the aformentioned range listed, please consider follow up with your Primary Care Provider.   Please follow up in 2-3 months.  Thank you for choosing [facility]  Dr Wilfrid Lund III

## 2016-06-15 NOTE — Progress Notes (Signed)
[facility] Consult Note:  History: Caitlyn [name] 06/15/2016  Referring physician: Henrine Screws, MD  Reason for consult/chief complaint: iron deficiency anemia (pt c/o black tarry stools and belching)   Subjective  HPI:  This is a 71 year old woman referred by primary care for another opinion regarding long-standing iron deficiency anemia. Records from the [facility] in 2013 and 2016 were available for review. This woman has chronic iron deficiency anemia with a negative EGD and colonoscopy in 2013 by Dr. Oletta Lamas, at which time she was also complaining of fecal incontinence. It does not look like colon biopsies were taken. She was reevaluated in 2016 with an upper endoscopy due to the anemia and reports of black stool. It was also nonrevealing study. The patient believes she had another colonoscopy at that point. If so, we do not have the record of that today. He was referred to [place] about 2 months ago for these symptoms and also out of concern for possible pancreatic insufficiency. It seems that because she continued to have intermittent fecal incontinence, and she read a story about pancreatic insufficiency, she felt this was likely the source of her symptoms. She brought that her PCP, who seems to have prescribed pancreatic enzyme supplements. She reports better formed or her stool since starting that medicine last month. Between the time she was referred in the visit today, she was admitted to the hospital about 2 weeks ago with a hip fracture and a non-STEMI. She underwent cardiac cath and placement of a drug-eluting stent to the LAD on a then underwent right hip fracture repair.  Review of prior records also indicates treatment with IV iron as far back as 2015 with Dr. Beryle Beams of the hematology Department. He last saw him in April 2017, and believes that is the last time she received IV iron.  Regarding concerns of pancreatic disease, she has never had pancreatitis to  her knowledge, never been a heavy alcohol user neither a smoker, and she believes her father had an episode of pancreatitis when hospitalized for a heart condition.  ROS:  Review of Systems  Constitutional: Negative for appetite change and unexpected weight change.  HENT: Negative for mouth sores and voice change.   Eyes: Negative for pain and redness.  Respiratory: Negative for cough and shortness of breath.   Cardiovascular: Negative for chest pain and palpitations.  Genitourinary: Negative for dysuria and hematuria.  Musculoskeletal: Negative for arthralgias and myalgias.       Right hip pain after recent fracture and surgery  Skin: Negative for pallor and rash.  Neurological: Negative for weakness and headaches.  Hematological: Negative for adenopathy.  Psychiatric/Behavioral: Positive for dysphoric mood.     Past Medical History: Past Medical History:  Diagnosis Date  . Anemia   . Arthritis   . Bleeding diathesis ([place]) 02/22/2013  . Closed right hip fracture ([place]) 05/28/2016  . Coronary artery disease   . E coli enteritis   . GERD (gastroesophageal reflux disease)   . H/O hiatal hernia   . Head injury   . Hypertension   . Iron deficiency anemia, unspecified 02/22/2013  . Mass of right kidney 08/22/2013   2.5 cm solid lesion seen on 5/14 CT  . Radicular pain of thoracic region 08/22/2013   Left T-8 started after pushing heavy chest 1/15; intermittent; no focal neuro deficit     Past Surgical History: Past Surgical History:  Procedure Laterality Date  . ABDOMINAL HYSTERECTOMY    . BREAST REDUCTION SURGERY  1981  .  BREAST SURGERY  1981   rt-nodes taken out-non cancer  . BREAST SURGERY  1981   rt lumpectomy  . CARDIAC CATHETERIZATION N/A 06/01/2016   Procedure: LEFT HEART CATH;  Surgeon: Lorretta Harp, MD;  Location: [facility];  Service: Cardiovascular;  Laterality: N/A;  . CARDIAC CATHETERIZATION N/A 06/01/2016   Procedure: Coronary Stent Intervention;   Surgeon: Lorretta Harp, MD;  Location: [facility];  Service: Cardiovascular;  Laterality: N/A;  . COLONOSCOPY    . FACIAL FRACTURE SURGERY  2000  . FEMUR IM NAIL Right 06/03/2016   Procedure: INTRAMEDULLARY (IM) NAIL FEMORAL;  Surgeon: Rod Can, MD;  Location: [facility];  Service: Orthopedics;  Laterality: Right;  . TONSILLECTOMY    . TRIGGER FINGER RELEASE Right 11/28/2013   Procedure: RELEASE TRIGGER FINGER/A-1 PULLEY RIGHT LONG FINGER;  Surgeon: Cammie Sickle, MD;  Location: [facility];  Service: Orthopedics;  Laterality: Right;  . URETHRA SURGERY     age 34     Family History: Family History  Problem Relation Age of Onset  . Heart attack Father   . Heart disease Mother   . Alzheimer's disease Mother   . Heart disease Brother   . Colon cancer Maternal Grandfather   . Stomach cancer Neg Hx   . Rectal cancer Neg Hx   . Esophageal cancer Neg Hx   . Liver cancer Neg Hx     Social History: Social History   Social History  . Marital status: Widowed    Spouse name: N/A  . Number of children: 1  . Years of education: N/A   Occupational History  . retired    Social History Main Topics  . Smoking status: Never Smoker  . Smokeless tobacco: Never Used  . Alcohol use No  . Drug use: No  . Sexual activity: Not Asked   Other Topics Concern  . None   Social History Narrative  . None    Allergies: Allergies  Allergen Reactions  . Cortisone Anaphylaxis    Some forms of cortisone  . Monosodium Glutamate Anaphylaxis  . Prednisone Anaphylaxis    confusion  . Shellfish Allergy Anaphylaxis  . Buprenorphine Hcl Nausea And Vomiting and Hypertension    SHAKING  . Morphine And Related Nausea And Vomiting, Other (See Comments) and Hypertension    SHAKING  . Readi-Cat [Barium Sulfate] Diarrhea    Patient does not want to drink Readi-Cat because it gives her diarrhea  . Demerol [Meperidine]     UNSPECIFIED REACTION   . Sulfa Antibiotics      UNSPECIFIED REACTION   . Benzoic Acid Nausea Only    Outpatient Meds: Current Outpatient Prescriptions  Medication Sig Dispense Refill  . Amino Acids-Protein Hydrolys (FEEDING SUPPLEMENT, PRO-STAT SUGAR FREE 64,) LIQD Take 30 mLs by mouth 2 (two) times daily. 900 mL 0  . aspirin 81 MG chewable tablet Chew 1 tablet (81 mg total) by mouth 2 (two) times daily with a meal.    . carvedilol (COREG) 12.5 MG tablet Take 1 tablet (12.5 mg total) by mouth 2 (two) times daily with a meal. 60 tablet 3  . clopidogrel (PLAVIX) 75 MG tablet Take 1 tablet (75 mg total) by mouth daily with breakfast. 30 tablet 2  . feeding supplement (BOOST / RESOURCE BREEZE) LIQD Take 1 Container by mouth 2 (two) times daily between meals.  0  . HYDROcodone-acetaminophen (NORCO) 7.5-325 MG tablet Take 1-2 tablets by mouth every 6 (six) hours  as needed for moderate pain. 20 tablet 0  . levothyroxine (SYNTHROID, LEVOTHROID) 88 MCG tablet Take 88 mcg by mouth daily.    . lipase/protease/amylase (CREON) 36000 UNITS CPEP capsule Take 36,000 Units by mouth daily.    . lipase/protease/amylase (CREON) 36000 UNITS CPEP capsule Take 36,000 Units by mouth as needed (may repeat x 2 if first dose is ineffective).    Marland Kitchen losartan (COZAAR) 25 MG tablet Take 1 tablet (25 mg total) by mouth daily. 30 tablet 2  . methocarbamol (ROBAXIN) 500 MG tablet Take 1 tablet (500 mg total) by mouth every 6 (six) hours as needed for muscle spasms. 30 tablet 3  . omeprazole (PRILOSEC) 40 MG capsule Take 1 capsule (40 mg total) by mouth daily. 30 capsule 0  . rosuvastatin (CRESTOR) 10 MG tablet Take 10 mg by mouth every other day.     . traMADol (ULTRAM) 50 MG tablet Take 2 tablets (100 mg total) by mouth every 6 (six) hours as needed for moderate pain. 20 tablet 0   No current facility-administered medications for this visit.    Facility-Administered Medications Ordered in Other Visits  Medication Dose Route Frequency Provider Last Rate Last Dose  .  fentaNYL (SUBLIMAZE) injection    Anesthesia Intra-op Sheppard Plumber, CRNA   50 mcg at 05/31/16 0721  . lactated ringers infusion    Continuous PRN Sheppard Plumber, CRNA 50 mL/hr at 06/03/16 2300        ___________________________________________________________________ Objective   Exam:  BP (!) 150/80   Pulse 84   Ht 5\' 4"  (1.626 m)   Wt 161 lb 2 oz (73.1 kg)   BMI 27.66 kg/m    General: this is a(n) Elderly woman with good muscle mass confined to a wheelchair, which limits her exam   Eyes: sclera anicteric, no redness  ENT: oral mucosa moist without lesions, no cervical or supraclavicular lymphadenopathy, good dentition  CV: RRR without murmur, S1/S2, no JVD, no peripheral edema  Resp: clear to auscultation bilaterally, normal RR and effort noted  GI: soft, no tenderness, with active bowel sounds. No guarding or palpable organomegaly noted.  Skin; warm and dry, no rash or jaundice noted  Neuro: awake, alert and oriented x 3. Normal gross motor function except right leg motion limited by pain.  Labs:  CBC Latest Ref Rng & Units 06/07/2016 06/06/2016 06/05/2016  WBC 4.0 - 10.5 K/uL 8.3 10.7(H) 11.8(H)  Hemoglobin 12.0 - 15.0 g/dL 8.9(L) 10.3(L) 10.6(L)  Hematocrit 36.0 - 46.0 % 27.9(L) 31.9(L) 32.7(L)  Platelets 150 - 400 K/uL 238 221 241    CT abdomen and pelvis June 2016 reports a normal pancreas.  Assessment: Encounter Diagnoses  Name Primary?  . Other iron deficiency anemia Yes  . Full incontinence of feces     This patient's iron deficiency anemia is long-standing and she has had 2 negative endoscopic workups. She reports being intolerant of oral iron, though she is committed to long-term IV iron treatment. I do not think a small bowel video capsule study is likely to be revealing in this scenario, nor change our management. I have told her I will send a message to her hematologist so they can make plans for reinstitution of IV iron therapy.  The patient does  not have any apparent risk factors for pancreatitis, and without weight loss it seems unlikely that she truly has pancreatic insufficiency. She felt strongly about continuing the enzyme replacements, and I have no objection to that she follows up with  me in a couple of months. At that point, I would plan to stop the enzyme supplements for a week and then check a fecal elastase.   Thank you for the courtesy of this consult.  Please call me with any questions or concerns.  Nelida Meuse III  CC: Henrine Screws, MD

## 2016-06-17 ENCOUNTER — Ambulatory Visit (INDEPENDENT_AMBULATORY_CARE_PROVIDER_SITE_OTHER): Payer: Medicare Other | Admitting: Cardiovascular Disease

## 2016-06-17 ENCOUNTER — Telehealth: Payer: Self-pay | Admitting: *Deleted

## 2016-06-17 ENCOUNTER — Encounter: Payer: Self-pay | Admitting: Cardiovascular Disease

## 2016-06-17 VITALS — BP 143/92 | HR 89 | Ht 65.0 in | Wt 162.6 lb

## 2016-06-17 DIAGNOSIS — I10 Essential (primary) hypertension: Secondary | ICD-10-CM

## 2016-06-17 DIAGNOSIS — Z955 Presence of coronary angioplasty implant and graft: Secondary | ICD-10-CM | POA: Diagnosis not present

## 2016-06-17 DIAGNOSIS — E7800 Pure hypercholesterolemia, unspecified: Secondary | ICD-10-CM | POA: Diagnosis not present

## 2016-06-17 NOTE — Telephone Encounter (Signed)
Message from pt - stated she felled 1.22 on the ice and broke her hip/femur. So, now she at [facility]. And Dr Beryle Beams had called her; returning his call. She has a doctor's appt today @ 1300 PM.

## 2016-06-17 NOTE — Assessment & Plan Note (Signed)
History of CAD status post mid LAD PCI and stenting by myself 06/01/1868 and a non-STEMI with a Synergy drug-eluting stent. She had no other stenting in CAD. She is on dual antiplatelet therapy. She denies chest pain or shortness of breath.

## 2016-06-17 NOTE — Assessment & Plan Note (Signed)
History of hyperlipidemia on statin therapy followed by her PCP. 

## 2016-06-17 NOTE — Assessment & Plan Note (Signed)
History of hypertension blood pressure measures 143/92. She is on carvedilol and losartan. Continue current meds at current dosing

## 2016-06-17 NOTE — Patient Instructions (Signed)
Medication Instructions: Your physician recommends that you continue on your current medications as directed. Please refer to the Current Medication list given to you today.   Labwork: I will request labs from Dr. Inda Merlin.   Follow-Up: We request that you follow-up in: 6 months with an extender and in 12 months with Dr Andria Rhein will receive a reminder letter in the mail two months in advance. If you don't receive a letter, please call our office to schedule the follow-up appointment.  If you need a refill on your cardiac medications before your next appointment, please call your pharmacy.

## 2016-06-17 NOTE — Progress Notes (Signed)
06/17/2016 Caitlyn [name]   [birth year]  [id number]  Primary Physician Henrine Screws, MD Primary Cardiologist: Lorretta Harp MD Renae Gloss  HPI:  Caitlyn [name] is a 71 year old mildly overweight Caucasian female who I been taking care of for many years. I last saw her in the office 10/29/15. I did perform coronary angiography on her in 2002 demonstrating normal coronary arteries. She does have a strong family history of heart disease. She apparently fell and broke her hip on the ice several days ago and was admitted to the hospital with an intention to perform ORIF of her right hip by Caitlyn [name] . She has had several episodes of chest pain with mildly elevated troponin. I performed cardiac catheterization on her 06/01/16 revealing a 90% mid LAD lesion which I stented with a Synergy drug-eluting stent with an excellent result. She underwent right ORIF for fractured hip one week later by Caitlyn [name] on dual antibiotic therapy. She is currently getting physical therapy and rehabilitation therapy at [facility] rehabilitation facility.  Current Outpatient Prescriptions  Medication Sig Dispense Refill  . Amino Acids-Protein Hydrolys (FEEDING SUPPLEMENT, PRO-STAT SUGAR FREE 64,) LIQD Take 30 mLs by mouth 2 (two) times daily. 900 mL 0  . aspirin 81 MG chewable tablet Chew 1 tablet (81 mg total) by mouth 2 (two) times daily with a meal.    . carvedilol (COREG) 12.5 MG tablet Take 1 tablet (12.5 mg total) by mouth 2 (two) times daily with a meal. 60 tablet 3  . clopidogrel (PLAVIX) 75 MG tablet Take 1 tablet (75 mg total) by mouth daily with breakfast. 30 tablet 2  . feeding supplement (BOOST / RESOURCE BREEZE) LIQD Take 1 Container by mouth 2 (two) times daily between meals.  0  . HYDROcodone-acetaminophen (NORCO) 7.5-325 MG tablet Take 1-2 tablets by mouth every 6 (six) hours as needed for moderate pain. 20 tablet 0  . levothyroxine (SYNTHROID, LEVOTHROID) 88 MCG tablet Take 88 mcg by mouth  daily.    . lipase/protease/amylase (CREON) 36000 UNITS CPEP capsule Take 36,000 Units by mouth daily.    . lipase/protease/amylase (CREON) 36000 UNITS CPEP capsule Take 36,000 Units by mouth as needed (may repeat x 2 if first dose is ineffective).    Marland Kitchen losartan (COZAAR) 25 MG tablet Take 1 tablet (25 mg total) by mouth daily. 30 tablet 2  . methocarbamol (ROBAXIN) 500 MG tablet Take 1 tablet (500 mg total) by mouth every 6 (six) hours as needed for muscle spasms. 30 tablet 3  . omeprazole (PRILOSEC) 40 MG capsule Take 1 capsule (40 mg total) by mouth daily. 30 capsule 0  . rosuvastatin (CRESTOR) 10 MG tablet Take 10 mg by mouth every other day.     . traMADol (ULTRAM) 50 MG tablet Take 2 tablets (100 mg total) by mouth every 6 (six) hours as needed for moderate pain. 20 tablet 0   No current facility-administered medications for this visit.    Facility-Administered Medications Ordered in Other Visits  Medication Dose Route Frequency Provider Last Rate Last Dose  . fentaNYL (SUBLIMAZE) injection    Anesthesia Intra-op Sheppard Plumber, CRNA   50 mcg at 05/31/16 0721  . lactated ringers infusion    Continuous PRN Sheppard Plumber, CRNA 50 mL/hr at 06/03/16 2300      Allergies  Allergen Reactions  . Cortisone Anaphylaxis    Some forms of cortisone  . Monosodium Glutamate Anaphylaxis  . Prednisone Anaphylaxis    confusion  .  Shellfish Allergy Anaphylaxis  . Buprenorphine Hcl Nausea And Vomiting and Hypertension    SHAKING  . Morphine And Related Nausea And Vomiting, Other (See Comments) and Hypertension    SHAKING  . Readi-Cat [Barium Sulfate] Diarrhea    Patient does not want to drink Readi-Cat because it gives her diarrhea  . Demerol [Meperidine]     UNSPECIFIED REACTION   . Sulfa Antibiotics     UNSPECIFIED REACTION   . Benzoic Acid Nausea Only    Social History   Social History  . Marital status: Widowed    Spouse name: N/A  . Number of children: 1  . Years of education:  N/A   Occupational History  . retired    Social History Main Topics  . Smoking status: Never Smoker  . Smokeless tobacco: Never Used  . Alcohol use No  . Drug use: No  . Sexual activity: Not on file   Other Topics Concern  . Not on file   Social History Narrative  . No narrative on file     Review of Systems: General: negative for chills, fever, night sweats or weight changes.  Cardiovascular: negative for chest pain, dyspnea on exertion, edema, orthopnea, palpitations, paroxysmal nocturnal dyspnea or shortness of breath Dermatological: negative for rash Respiratory: negative for cough or wheezing Urologic: negative for hematuria Abdominal: negative for nausea, vomiting, diarrhea, bright red blood per rectum, melena, or hematemesis Neurologic: negative for visual changes, syncope, or dizziness All other systems reviewed and are otherwise negative except as noted above.    Blood pressure (!) 143/92, pulse 89, height 5\' 5"  (1.651 m), weight 162 lb 9.6 oz (73.8 kg), SpO2 99 %.  General appearance: alert and no distress Neck: no adenopathy, no carotid bruit, no JVD, supple, symmetrical, trachea midline and thyroid not enlarged, symmetric, no tenderness/mass/nodules Lungs: clear to auscultation bilaterally Heart: regular rate and rhythm, S1, S2 normal, no murmur, click, rub or gallop Extremities: extremities normal, atraumatic, no cyanosis or edema  EKG sinus rhythm at 89 with nonspecific ST and T-wave changes. I personally reviewed this EKG  ASSESSMENT AND PLAN:   Essential hypertension History of hypertension blood pressure measures 143/92. She is on carvedilol and losartan. Continue current meds at current dosing  Hyperlipidemia History of hyperlipidemia on statin therapy followed by her PCP  Status post insertion of drug-eluting stent into left anterior descending (LAD) artery History of CAD status post mid LAD PCI and stenting by myself 06/01/1868 and a non-STEMI with  a Synergy drug-eluting stent. She had no other stenting in CAD. She is on dual antiplatelet therapy. She denies chest pain or shortness of breath.      Lorretta Harp MD FACP,FACC,FAHA, [facility] 06/17/2016 2:14 PM

## 2016-06-29 ENCOUNTER — Observation Stay (HOSPITAL_COMMUNITY): Payer: Medicare Other

## 2016-06-29 ENCOUNTER — Observation Stay (HOSPITAL_COMMUNITY)
Admission: EM | Admit: 2016-06-29 | Discharge: 2016-07-01 | Disposition: A | Discharge status: Home / Self Care | Payer: Medicare Other | Attending: Internal Medicine | Admitting: Internal Medicine

## 2016-06-29 ENCOUNTER — Encounter (HOSPITAL_COMMUNITY): Payer: Self-pay

## 2016-06-29 ENCOUNTER — Emergency Department (HOSPITAL_COMMUNITY): Payer: Medicare Other

## 2016-06-29 DIAGNOSIS — Z9889 Other specified postprocedural states: Secondary | ICD-10-CM | POA: Diagnosis not present

## 2016-06-29 DIAGNOSIS — E785 Hyperlipidemia, unspecified: Secondary | ICD-10-CM | POA: Diagnosis not present

## 2016-06-29 DIAGNOSIS — E039 Hypothyroidism, unspecified: Secondary | ICD-10-CM | POA: Diagnosis not present

## 2016-06-29 DIAGNOSIS — Z7982 Long term (current) use of aspirin: Secondary | ICD-10-CM | POA: Insufficient documentation

## 2016-06-29 DIAGNOSIS — Z955 Presence of coronary angioplasty implant and graft: Secondary | ICD-10-CM | POA: Insufficient documentation

## 2016-06-29 DIAGNOSIS — Z7902 Long term (current) use of antithrombotics/antiplatelets: Secondary | ICD-10-CM | POA: Insufficient documentation

## 2016-06-29 DIAGNOSIS — D62 Acute posthemorrhagic anemia: Secondary | ICD-10-CM | POA: Insufficient documentation

## 2016-06-29 DIAGNOSIS — I2510 Atherosclerotic heart disease of native coronary artery without angina pectoris: Secondary | ICD-10-CM | POA: Diagnosis not present

## 2016-06-29 DIAGNOSIS — K922 Gastrointestinal hemorrhage, unspecified: Principal | ICD-10-CM

## 2016-06-29 DIAGNOSIS — K219 Gastro-esophageal reflux disease without esophagitis: Secondary | ICD-10-CM | POA: Diagnosis not present

## 2016-06-29 DIAGNOSIS — Z79899 Other long term (current) drug therapy: Secondary | ICD-10-CM | POA: Diagnosis not present

## 2016-06-29 DIAGNOSIS — Z789 Other specified health status: Secondary | ICD-10-CM

## 2016-06-29 DIAGNOSIS — I10 Essential (primary) hypertension: Secondary | ICD-10-CM | POA: Insufficient documentation

## 2016-06-29 DIAGNOSIS — F329 Major depressive disorder, single episode, unspecified: Secondary | ICD-10-CM | POA: Diagnosis not present

## 2016-06-29 DIAGNOSIS — E784 Other hyperlipidemia: Secondary | ICD-10-CM

## 2016-06-29 DIAGNOSIS — I252 Old myocardial infarction: Secondary | ICD-10-CM | POA: Insufficient documentation

## 2016-06-29 DIAGNOSIS — Z9861 Coronary angioplasty status: Secondary | ICD-10-CM

## 2016-06-29 DIAGNOSIS — Z96641 Presence of right artificial hip joint: Secondary | ICD-10-CM | POA: Diagnosis not present

## 2016-06-29 LAB — COMPREHENSIVE METABOLIC PANEL
ALT: 10 U/L — ABNORMAL LOW (ref 14–54)
ANION GAP: 13 (ref 5–15)
AST: 17 U/L (ref 15–41)
Albumin: 3.8 g/dL (ref 3.5–5.0)
Alkaline Phosphatase: 86 U/L (ref 38–126)
BILIRUBIN TOTAL: 0.9 mg/dL (ref 0.3–1.2)
BUN: 18 mg/dL (ref 6–20)
CO2: 20 mmol/L — ABNORMAL LOW (ref 22–32)
Calcium: 9.6 mg/dL (ref 8.9–10.3)
Chloride: 105 mmol/L (ref 101–111)
Creatinine, Ser: 0.64 mg/dL (ref 0.44–1.00)
Glucose, Bld: 144 mg/dL — ABNORMAL HIGH (ref 65–99)
POTASSIUM: 3.8 mmol/L (ref 3.5–5.1)
Sodium: 138 mmol/L (ref 135–145)
TOTAL PROTEIN: 7.1 g/dL (ref 6.5–8.1)

## 2016-06-29 LAB — CBC
HEMATOCRIT: 36.6 % (ref 36.0–46.0)
HEMOGLOBIN: 11.8 g/dL — AB (ref 12.0–15.0)
MCH: 28.9 pg (ref 26.0–34.0)
MCHC: 32.2 g/dL (ref 30.0–36.0)
MCV: 89.5 fL (ref 78.0–100.0)
Platelets: 226 10*3/uL (ref 150–400)
RBC: 4.09 MIL/uL (ref 3.87–5.11)
RDW: 15.4 % (ref 11.5–15.5)
WBC: 7.2 10*3/uL (ref 4.0–10.5)

## 2016-06-29 LAB — PROTIME-INR
INR: 1.11
PROTHROMBIN TIME: 14.4 s (ref 11.4–15.2)

## 2016-06-29 LAB — URINALYSIS, ROUTINE W REFLEX MICROSCOPIC
BILIRUBIN URINE: NEGATIVE
Glucose, UA: NEGATIVE mg/dL
HGB URINE DIPSTICK: NEGATIVE
Ketones, ur: NEGATIVE mg/dL
Leukocytes, UA: NEGATIVE
Nitrite: NEGATIVE
PROTEIN: 100 mg/dL — AB
SPECIFIC GRAVITY, URINE: 1.023 (ref 1.005–1.030)
pH: 5 (ref 5.0–8.0)

## 2016-06-29 LAB — TYPE AND SCREEN
ABO/RH(D): A POS
ANTIBODY SCREEN: NEGATIVE

## 2016-06-29 LAB — POC OCCULT BLOOD, ED: Fecal Occult Bld: POSITIVE — AB

## 2016-06-29 LAB — HEMOGLOBIN: Hemoglobin: 9.7 g/dL — ABNORMAL LOW (ref 12.0–15.0)

## 2016-06-29 LAB — HEMATOCRIT: HCT: 30.6 % — ABNORMAL LOW (ref 36.0–46.0)

## 2016-06-29 MED ORDER — CITALOPRAM HYDROBROMIDE 10 MG PO TABS
20.0000 mg | ORAL_TABLET | Freq: Every day | ORAL | Status: DC
  Administered 2016-06-30 – 2016-07-01 (×2): 20 mg via ORAL
  Filled 2016-06-29 (×2): qty 2

## 2016-06-29 MED ORDER — ROSUVASTATIN CALCIUM 10 MG PO TABS: 10.0000 mg | ORAL_TABLET | ORAL | Status: DC

## 2016-06-29 MED ORDER — ACETAMINOPHEN 325 MG PO TABS
650.0000 mg | ORAL_TABLET | Freq: Four times a day (QID) | ORAL | Status: DC | PRN
  Administered 2016-06-30: 650 mg via ORAL
  Filled 2016-06-29 (×2): qty 2

## 2016-06-29 MED ORDER — CLOPIDOGREL BISULFATE 75 MG PO TABS
75.0000 mg | ORAL_TABLET | Freq: Every day | ORAL | Status: DC
  Administered 2016-06-30 – 2016-07-01 (×2): 75 mg via ORAL
  Filled 2016-06-29 (×2): qty 1

## 2016-06-29 MED ORDER — PANTOPRAZOLE SODIUM 40 MG IV SOLR
40.0000 mg | Freq: Two times a day (BID) | INTRAVENOUS | Status: DC
  Administered 2016-06-29 – 2016-07-01 (×4): 40 mg via INTRAVENOUS
  Filled 2016-06-29 (×4): qty 40

## 2016-06-29 MED ORDER — TRAZODONE HCL 50 MG PO TABS
25.0000 mg | ORAL_TABLET | Freq: Every evening | ORAL | Status: DC | PRN
  Administered 2016-06-29 – 2016-06-30 (×2): 25 mg via ORAL
  Filled 2016-06-29 (×3): qty 1

## 2016-06-29 MED ORDER — HYDRALAZINE HCL 20 MG/ML IJ SOLN
10.0000 mg | Freq: Four times a day (QID) | INTRAMUSCULAR | Status: DC | PRN
  Administered 2016-06-29 – 2016-07-01 (×2): 10 mg via INTRAVENOUS
  Filled 2016-06-29 (×2): qty 1

## 2016-06-29 MED ORDER — LIDOCAINE HCL (CARDIAC) 20 MG/ML IV SOLN
INTRAVENOUS | Status: AC
  Filled 2016-06-29: qty 5

## 2016-06-29 MED ORDER — ACETAMINOPHEN 325 MG PO TABS
650.0000 mg | ORAL_TABLET | Freq: Once | ORAL | Status: AC
  Administered 2016-06-29: 650 mg via ORAL
  Filled 2016-06-29: qty 2

## 2016-06-29 MED ORDER — ONDANSETRON HCL 4 MG/2ML IJ SOLN
4.0000 mg | Freq: Once | INTRAMUSCULAR | Status: AC
  Administered 2016-06-29: 4 mg via INTRAVENOUS
  Filled 2016-06-29: qty 2

## 2016-06-29 MED ORDER — CITALOPRAM HYDROBROMIDE 10 MG PO TABS
20.0000 mg | ORAL_TABLET | Freq: Every day | ORAL | Status: DC
  Filled 2016-06-29: qty 2

## 2016-06-29 MED ORDER — PANTOPRAZOLE SODIUM 40 MG PO TBEC: 40.0000 mg | DELAYED_RELEASE_TABLET | Freq: Every day | ORAL | Status: DC

## 2016-06-29 MED ORDER — MIDAZOLAM HCL 2 MG/2ML IJ SOLN
1.0000 mg | Freq: Once | INTRAMUSCULAR | Status: AC
  Administered 2016-06-29: 1 mg via INTRAVENOUS
  Filled 2016-06-29: qty 2

## 2016-06-29 MED ORDER — ACETAMINOPHEN 650 MG RE SUPP: 650.0000 mg | Freq: Four times a day (QID) | RECTAL | Status: DC | PRN

## 2016-06-29 MED ORDER — HYDROCODONE-ACETAMINOPHEN 7.5-325 MG PO TABS
1.0000 | ORAL_TABLET | Freq: Four times a day (QID) | ORAL | Status: DC | PRN
  Administered 2016-06-29 – 2016-07-01 (×4): 1 via ORAL
  Filled 2016-06-29 (×3): qty 1
  Filled 2016-06-29 (×2): qty 2

## 2016-06-29 MED ORDER — LEVOTHYROXINE SODIUM 88 MCG PO TABS
88.0000 ug | ORAL_TABLET | Freq: Every day | ORAL | Status: DC
  Administered 2016-06-30 – 2016-07-01 (×2): 88 ug via ORAL
  Filled 2016-06-29 (×2): qty 1

## 2016-06-29 MED ORDER — CARVEDILOL 12.5 MG PO TABS
12.5000 mg | ORAL_TABLET | Freq: Two times a day (BID) | ORAL | Status: DC
  Administered 2016-06-29 – 2016-07-01 (×4): 12.5 mg via ORAL
  Filled 2016-06-29 (×4): qty 1

## 2016-06-29 MED ORDER — ONDANSETRON HCL 4 MG/2ML IJ SOLN: 4.0000 mg | Freq: Four times a day (QID) | INTRAMUSCULAR | Status: DC | PRN

## 2016-06-29 MED ORDER — ASPIRIN EC 81 MG PO TBEC
81.0000 mg | DELAYED_RELEASE_TABLET | Freq: Every day | ORAL | Status: DC
  Administered 2016-06-30 – 2016-07-01 (×2): 81 mg via ORAL
  Filled 2016-06-29 (×2): qty 1

## 2016-06-29 MED ORDER — SALINE SPRAY 0.65 % NA SOLN
1.0000 | NASAL | Status: DC | PRN
  Administered 2016-06-29: 1 via NASAL
  Filled 2016-06-29: qty 44

## 2016-06-29 MED ORDER — SODIUM CHLORIDE 0.9 % IV SOLN
INTRAVENOUS | Status: DC
  Administered 2016-06-29 – 2016-06-30 (×4): via INTRAVENOUS

## 2016-06-29 MED ORDER — LIDOCAINE VISCOUS 2 % MT SOLN
15.0000 mL | Freq: Once | OROMUCOSAL | Status: AC
  Administered 2016-06-29: 15 mL via OROMUCOSAL
  Filled 2016-06-29: qty 15

## 2016-06-29 MED ORDER — SODIUM CHLORIDE 0.9 % IV BOLUS (SEPSIS)
1000.0000 mL | Freq: Once | INTRAVENOUS | Status: AC
  Administered 2016-06-29: 1000 mL via INTRAVENOUS

## 2016-06-29 MED ORDER — LOSARTAN POTASSIUM 25 MG PO TABS
25.0000 mg | ORAL_TABLET | Freq: Every day | ORAL | Status: DC
  Administered 2016-06-29 – 2016-07-01 (×3): 25 mg via ORAL
  Filled 2016-06-29 (×3): qty 1

## 2016-06-29 MED ORDER — PANTOPRAZOLE SODIUM 40 MG IV SOLR
40.0000 mg | Freq: Once | INTRAVENOUS | Status: AC
  Administered 2016-06-29: 40 mg via INTRAVENOUS
  Filled 2016-06-29: qty 40

## 2016-06-29 MED ORDER — ONDANSETRON HCL 4 MG PO TABS: 4.0000 mg | ORAL_TABLET | Freq: Four times a day (QID) | ORAL | Status: DC | PRN

## 2016-06-29 NOTE — ED Notes (Signed)
Dr. Merrell at bedside. 

## 2016-06-29 NOTE — ED Provider Notes (Signed)
[facility] DEPT Provider Note   CSN: [id number] Arrival date & time: 06/29/16  1158     History   Chief Complaint Chief Complaint  Patient presents with  . GI Bleeding    HPI Caitlyn [name] is a 71 y.o. female.  Patient is a 71 year old female with a history of recent N STEMI, cardiac catheterization with stents placement, hip replacement after a fall with hip fracture who got out of rehabilitation 6 days ago and presents today with bloody emesis. Patient states prior to leaving rehabilitation she felt like she may have a urinary tract infection because she had pressure and frequency with urination. She spoke with her doctor who usually gives her amoxicillin for UTIs but was given Macrobid. She took one dose of the antibiotic on Saturday which is when her symptoms started. She had 2 episodes of dark vomit that looked like liver on Saturday and then again today around 8:00 had a dark episode of emesis that again looked bloody and one dark stool. She has had epigastric discomfort. She denies any cough but states right before vomiting she'll become short of breath. She is currently on anticoagulation Plavix and aspirin. No prior history of GI bleeding. No alcohol use. She was taking Prilosec while at rehabilitation but when she left she had no further prescription. She took one yesterday.  She went to her PCP today where she had a positive Hemoccult of onset here for further care.   The history is provided by the patient.    Past Medical History:  Diagnosis Date  . Anemia   . Arthritis   . Bleeding diathesis ([place]) 02/22/2013  . Closed right hip fracture ([place]) 05/28/2016  . Coronary artery disease   . E coli enteritis   . GERD (gastroesophageal reflux disease)   . H/O hiatal hernia   . Head injury   . Hypertension   . Iron deficiency anemia, unspecified 02/22/2013  . Mass of right kidney 08/22/2013   2.5 cm solid lesion seen on 5/14 CT  . Radicular pain of thoracic region  08/22/2013   Left T-8 started after pushing heavy chest 1/15; intermittent; no focal neuro deficit    Patient Active Problem List   Diagnosis Date Noted  . CAD (coronary artery disease), native coronary artery 06/07/2016  . Postop check   . Displaced intertrochanteric fracture of right femur, initial encounter for closed fracture ([place]) 06/03/2016  . Status post insertion of drug-eluting stent into left anterior descending (LAD) artery   . NSTEMI (non-ST elevated myocardial infarction) ([place])   . Hypothyroidism, acquired 05/28/2016  . Closed fracture of right hip ([place]) 05/28/2016  . Essential hypertension 10/29/2015  . Hyperlipidemia 10/29/2015  . Mass of right kidney 08/22/2013  . Iron deficiency anemia 02/22/2013    Past Surgical History:  Procedure Laterality Date  . ABDOMINAL HYSTERECTOMY    . BREAST REDUCTION SURGERY  1981  . BREAST SURGERY  1981   rt-nodes taken out-non cancer  . BREAST SURGERY  1981   rt lumpectomy  . CARDIAC CATHETERIZATION N/A 06/01/2016   Procedure: LEFT HEART CATH;  Surgeon: Lorretta Harp, MD;  Location: [facility];  Service: Cardiovascular;  Laterality: N/A;  . CARDIAC CATHETERIZATION N/A 06/01/2016   Procedure: Coronary Stent Intervention;  Surgeon: Lorretta Harp, MD;  Location: [facility];  Service: Cardiovascular;  Laterality: N/A;  . COLONOSCOPY    . FACIAL FRACTURE SURGERY  2000  . FEMUR IM NAIL Right 06/03/2016   Procedure: INTRAMEDULLARY (  IM) NAIL FEMORAL;  Surgeon: Rod Can, MD;  Location: [facility];  Service: Orthopedics;  Laterality: Right;  . TONSILLECTOMY    . TRIGGER FINGER RELEASE Right 11/28/2013   Procedure: RELEASE TRIGGER FINGER/A-1 PULLEY RIGHT LONG FINGER;  Surgeon: Cammie Sickle, MD;  Location: [facility];  Service: Orthopedics;  Laterality: Right;  . URETHRA SURGERY     age 60    OB History    No data available       Home Medications    Prior to Admission medications   Medication  Sig Start Date End Date Taking? Authorizing Provider  Amino Acids-Protein Hydrolys (FEEDING SUPPLEMENT, PRO-STAT SUGAR FREE 64,) LIQD Take 30 mLs by mouth 2 (two) times daily. 06/06/16   Eugenie Filler, MD  aspirin 81 MG chewable tablet Chew 1 tablet (81 mg total) by mouth 2 (two) times daily with a meal. 06/06/16   Eugenie Filler, MD  carvedilol (COREG) 12.5 MG tablet Take 1 tablet (12.5 mg total) by mouth 2 (two) times daily with a meal. 06/06/16   Eugenie Filler, MD  clopidogrel (PLAVIX) 75 MG tablet Take 1 tablet (75 mg total) by mouth daily with breakfast. 06/07/16   Eugenie Filler, MD  feeding supplement (BOOST / RESOURCE BREEZE) LIQD Take 1 Container by mouth 2 (two) times daily between meals. 06/06/16   Eugenie Filler, MD  HYDROcodone-acetaminophen (NORCO) 7.5-325 MG tablet Take 1-2 tablets by mouth every 6 (six) hours as needed for moderate pain. 06/06/16   Eugenie Filler, MD  levothyroxine (SYNTHROID, LEVOTHROID) 88 MCG tablet Take 88 mcg by mouth daily.    Historical Provider, MD  lipase/protease/amylase (CREON) 36000 UNITS CPEP capsule Take 36,000 Units by mouth daily.    Historical Provider, MD  lipase/protease/amylase (CREON) 36000 UNITS CPEP capsule Take 36,000 Units by mouth as needed (may repeat x 2 if first dose is ineffective).    Historical Provider, MD  losartan (COZAAR) 25 MG tablet Take 1 tablet (25 mg total) by mouth daily. 06/07/16   Eugenie Filler, MD  methocarbamol (ROBAXIN) 500 MG tablet Take 1 tablet (500 mg total) by mouth every 6 (six) hours as needed for muscle spasms. 06/06/16   Eugenie Filler, MD  omeprazole (PRILOSEC) 40 MG capsule Take 1 capsule (40 mg total) by mouth daily. 06/06/16   Eugenie Filler, MD  rosuvastatin (CRESTOR) 10 MG tablet Take 10 mg by mouth every other day.     Historical Provider, MD  traMADol (ULTRAM) 50 MG tablet Take 2 tablets (100 mg total) by mouth every 6 (six) hours as needed for moderate pain. 06/06/16   Eugenie Filler, MD    Family History Family History  Problem Relation Age of Onset  . Heart attack Father   . Heart disease Mother   . Alzheimer's disease Mother   . Heart disease Brother   . Colon cancer Maternal Grandfather   . Stomach cancer Neg Hx   . Rectal cancer Neg Hx   . Esophageal cancer Neg Hx   . Liver cancer Neg Hx     Social History Social History  Substance Use Topics  . Smoking status: Never Smoker  . Smokeless tobacco: Never Used  . Alcohol use No     Allergies   Cortisone; Monosodium glutamate; Prednisone; Shellfish allergy; Buprenorphine hcl; Morphine and related; Readi-cat [barium sulfate]; Demerol [meperidine]; Sulfa antibiotics; and Benzoic acid   Review of Systems Review of Systems  All other systems  reviewed and are negative.    Physical Exam Updated Vital Signs BP 158/79   Pulse 104   Temp 99.1 F (37.3 C)   Resp 23   Ht 5\' 5"  (1.651 m)   Wt 158 lb 3.2 oz (71.8 kg)   SpO2 100%   BMI 26.33 kg/m   Physical Exam  Constitutional: She is oriented to person, place, and time. She appears well-developed and well-nourished. No distress.  HENT:  Head: Normocephalic and atraumatic.  Mouth/Throat: Oropharynx is clear and moist.  Eyes: Conjunctivae and EOM are normal. Pupils are equal, round, and reactive to light.  Neck: Normal range of motion. Neck supple.  Cardiovascular: Regular rhythm and intact distal pulses.  Tachycardia present.   No murmur heard. Pulmonary/Chest: Effort normal and breath sounds normal. No respiratory distress. She has no wheezes. She has no rales.  Abdominal: Soft. She exhibits no distension. There is tenderness in the epigastric area. There is no rebound and no guarding.  Musculoskeletal: Normal range of motion. She exhibits no edema or tenderness.  Neurological: She is alert and oriented to person, place, and time.  Skin: Skin is warm and dry. No rash noted. No erythema.  Psychiatric: She has a normal mood and affect. Her  behavior is normal.  Nursing note and vitals reviewed.    ED Treatments / Results  Labs (all labs ordered are listed, but only abnormal results are displayed) Labs Reviewed  COMPREHENSIVE METABOLIC PANEL - Abnormal; Notable for the following:       Result Value   CO2 20 (*)    Glucose, Bld 144 (*)    ALT 10 (*)    All other components within normal limits  CBC - Abnormal; Notable for the following:    Hemoglobin 11.8 (*)    All other components within normal limits  URINALYSIS, ROUTINE W REFLEX MICROSCOPIC - Abnormal; Notable for the following:    APPearance HAZY (*)    Protein, ur 100 (*)    Bacteria, UA RARE (*)    Squamous Epithelial / LPF 0-5 (*)    All other components within normal limits  POC OCCULT BLOOD, ED - Abnormal; Notable for the following:    Fecal Occult Bld POSITIVE (*)    All other components within normal limits  URINE CULTURE  PROTIME-INR  TYPE AND SCREEN    EKG  EKG Interpretation  Date/Time:  Monday June 29 2016 14:54:04 EST Ventricular Rate:  97 PR Interval:    QRS Duration: 90 QT Interval:  366 QTC Calculation: 461 R Axis:   53 Text Interpretation:  Sinus rhythm No significant change since last tracing Confirmed by Maryan Rued  MD, Loree Fee ([id number]) on 06/29/2016 3:24:42 PM       Radiology Dg Chest Port 1 View  Result Date: 06/29/2016 CLINICAL DATA:  Hematemesis, recent cardiac catheterization, history hypertension, coronary artery disease EXAM: PORTABLE CHEST 1 VIEW COMPARISON:  Portable exam 1452 hours compared to 06/05/2016 FINDINGS: Normal heart size with note of a coronary arterial stent. Pulmonary vascularity normal. Large hiatal hernia. Lungs clear. No pleural effusion or pneumothorax. Bones demineralized. IMPRESSION: Large hiatal hernia without acute infiltrate. Coronary arterial stent. Electronically Signed   By: Lavonia Dana M.D.   On: 06/29/2016 15:06    Procedures Procedures (including critical care time)  Medications Ordered in  ED Medications  ondansetron (ZOFRAN) injection 4 mg (not administered)  sodium chloride 0.9 % bolus 1,000 mL (not administered)  pantoprazole (PROTONIX) injection 40 mg (not administered)  Initial Impression / Assessment and Plan / ED Course  I have reviewed the triage vital signs and the nursing notes.  Pertinent labs & imaging results that were available during my care of the patient were reviewed by me and considered in my medical decision making (see chart for details).    Patient presenting today with concerns for possible upper GI bleeding. She has had 3 dark emesis that looked bloody in the last 3 days and a dark stool this morning. She is having epigastric discomfort and states all this started after taking Macrobid for a potential UTI. She is currently on anticoagulation due to her recent N STEMI and stent placement. She also has recently had her hip replaced after fracture of the fall. Patient currently is complaining of nausea and epigastric discomfort. She has no URI symptoms. She denies any chest pain. She is not currently on a PPI. She was taking PPI rehabilitation but has been off of it for 1 week. No prior GI bleeding in the past. CBC with stable hemoglobin at this time, CMP without acute findings, type and screen completed. We'll place an NG tube for further evaluation, patient given IV fluids and nausea medication. EKG and UA pending  3:44 PM Hemoccult is positive. Chest x-ray without acute findings. EKG without acute findings. UA within normal limits. Patient is borderline febrile at 100.3 of unknown source. NG tube will be placed and scope with [facility]. Final Clinical Impressions(s) / ED Diagnoses   Final diagnoses:  Acute GI bleeding    New Prescriptions New Prescriptions   No medications on file     Blanchie Dessert, MD 06/29/16 [phone]

## 2016-06-29 NOTE — ED Notes (Signed)
Will d/c NG tube per Dr. Marily Memos

## 2016-06-29 NOTE — Progress Notes (Signed)
Pt's order was confusing regarding NPO or clear liq diet. NP was paged for clarification. Will continue to monitor.

## 2016-06-29 NOTE — H&P (Signed)
History and Physical    Caitlyn [name] [id number] DOB: 1946/04/01 DOA: 06/29/2016  PCP: Henrine Screws, MD   Patient coming from: Home  Chief Complaint: GI bleeding  HPI: Caitlyn [name] is a 71 y.o. female with medical history significant of coronary artery disease with recent non-ST elevation MI treated with stents placement to the LAD and on dual antiplatelet therapy, status post hip replacement after a full rule out again he fracture who was recently discharged from rehabilitation facility and presented today to the emergency department with complaints of bloody emesis While in the rehabilitation facility patient developed UTI symptoms - frequent urination and lower abdominal pressure. She related symptoms to her physician and was placed on Macrobid. After taking one dose of Macrobid she developed severe episodes of vomiting with dark contents. She also had some upper abdominal discomfort and in addition had dark stool.  Currently patient is on aspirin and Plavix for recent coronary stent placement and has no previous history of GI bleeding She was seen today by her PCP and had positive Hemoccult test based on which she was referred to the emergency room for further evaluation and treatment  ED Course: On presentation to the ED patient had stable vital signs, blood work revealed hemoglobin of 11.8, otherwise unremarkable, fecal local blot test was positive, UA negative for obvious infection During the assessment patient still had mild as compared to in the lower abdomen, denied nausea and complained of pain in the right hip post op site   Review of Systems: As per HPI otherwise 10 point review of systems negative.   Ambulatory Status: Ambulates with minimal assistance  Past Medical History:  Diagnosis Date  . Anemia   . Arthritis   . Bleeding diathesis ([place]) 02/22/2013  . Closed right hip fracture ([place]) 05/28/2016  . Coronary artery disease   . E coli enteritis   . GERD  (gastroesophageal reflux disease)   . H/O hiatal hernia   . Head injury   . Hypertension   . Iron deficiency anemia, unspecified 02/22/2013  . Mass of right kidney 08/22/2013   2.5 cm solid lesion seen on 5/14 CT  . Radicular pain of thoracic region 08/22/2013   Left T-8 started after pushing heavy chest 1/15; intermittent; no focal neuro deficit    Past Surgical History:  Procedure Laterality Date  . ABDOMINAL HYSTERECTOMY    . BREAST REDUCTION SURGERY  1981  . BREAST SURGERY  1981   rt-nodes taken out-non cancer  . BREAST SURGERY  1981   rt lumpectomy  . CARDIAC CATHETERIZATION N/A 06/01/2016   Procedure: LEFT HEART CATH;  Surgeon: Lorretta Harp, MD;  Location: [facility];  Service: Cardiovascular;  Laterality: N/A;  . CARDIAC CATHETERIZATION N/A 06/01/2016   Procedure: Coronary Stent Intervention;  Surgeon: Lorretta Harp, MD;  Location: [facility];  Service: Cardiovascular;  Laterality: N/A;  . COLONOSCOPY    . FACIAL FRACTURE SURGERY  2000  . FEMUR IM NAIL Right 06/03/2016   Procedure: INTRAMEDULLARY (IM) NAIL FEMORAL;  Surgeon: Rod Can, MD;  Location: [facility];  Service: Orthopedics;  Laterality: Right;  . TONSILLECTOMY    . TRIGGER FINGER RELEASE Right 11/28/2013   Procedure: RELEASE TRIGGER FINGER/A-1 PULLEY RIGHT LONG FINGER;  Surgeon: Cammie Sickle, MD;  Location: [facility];  Service: Orthopedics;  Laterality: Right;  . URETHRA SURGERY     age 68    Social History   Social History  . Marital status:  Widowed    Spouse name: N/A  . Number of children: 1  . Years of education: N/A   Occupational History  . retired    Social History Main Topics  . Smoking status: Never Smoker  . Smokeless tobacco: Never Used  . Alcohol use No  . Drug use: No  . Sexual activity: Not on file   Other Topics Concern  . Not on file   Social History Narrative  . No narrative on file    Allergies  Allergen Reactions  . Cortisone  Anaphylaxis    Some forms of cortisone  . Monosodium Glutamate Anaphylaxis  . Prednisone Anaphylaxis    confusion  . Shellfish Allergy Anaphylaxis  . Buprenorphine Hcl Nausea And Vomiting and Hypertension    SHAKING  . Morphine And Related Nausea And Vomiting, Other (See Comments) and Hypertension    SHAKING  . Readi-Cat [Barium Sulfate] Diarrhea    Patient does not want to drink Readi-Cat because it gives her diarrhea  . Demerol [Meperidine]     UNSPECIFIED REACTION   . Sulfa Antibiotics     UNSPECIFIED REACTION   . Xanax [Alprazolam] Other (See Comments)    "makes me crazy"  . Benzoic Acid Nausea Only    Family History  Problem Relation Age of Onset  . Heart attack Father   . Heart disease Mother   . Alzheimer's disease Mother   . Heart disease Brother   . Colon cancer Maternal Grandfather   . Stomach cancer Neg Hx   . Rectal cancer Neg Hx   . Esophageal cancer Neg Hx   . Liver cancer Neg Hx     Prior to Admission medications   Medication Sig Start Date End Date Taking? Authorizing Provider  carvedilol (COREG) 12.5 MG tablet Take 1 tablet (12.5 mg total) by mouth 2 (two) times daily with a meal. 06/06/16  Yes Eugenie Filler, MD  citalopram (CELEXA) 20 MG tablet Take 20 mg by mouth daily. 06/24/16  Yes Historical Provider, MD  clopidogrel (PLAVIX) 75 MG tablet Take 1 tablet (75 mg total) by mouth daily with breakfast. 06/07/16  Yes Eugenie Filler, MD  feeding supplement (BOOST / RESOURCE BREEZE) LIQD Take 1 Container by mouth 2 (two) times daily between meals. 06/06/16  Yes Eugenie Filler, MD  HYDROcodone-acetaminophen (NORCO) 7.5-325 MG tablet Take 1-2 tablets by mouth every 6 (six) hours as needed for moderate pain. 06/06/16  Yes Eugenie Filler, MD  levothyroxine (SYNTHROID, LEVOTHROID) 88 MCG tablet Take 88 mcg by mouth daily.   Yes Historical Provider, MD  lipase/protease/amylase (CREON) 36000 UNITS CPEP capsule Take 36,000 Units by mouth daily. May take another  36,000 units if needed   Yes Historical Provider, MD  lipase/protease/amylase (CREON) 36000 UNITS CPEP capsule Take 36,000 Units by mouth as needed (may repeat x 2 if first dose is ineffective).   Yes Historical Provider, MD  losartan (COZAAR) 25 MG tablet Take 1 tablet (25 mg total) by mouth daily. 06/07/16  Yes Eugenie Filler, MD  methocarbamol (ROBAXIN) 500 MG tablet Take 1 tablet (500 mg total) by mouth every 6 (six) hours as needed for muscle spasms. 06/06/16  Yes Eugenie Filler, MD  omeprazole (PRILOSEC) 40 MG capsule Take 1 capsule (40 mg total) by mouth daily. 06/06/16  Yes Eugenie Filler, MD  rosuvastatin (CRESTOR) 10 MG tablet Take 10 mg by mouth every other day.    Yes Historical Provider, MD  Amino Acids-Protein Hydrolys (FEEDING SUPPLEMENT, PRO-STAT  SUGAR FREE 64,) LIQD Take 30 mLs by mouth 2 (two) times daily. Patient not taking: Reported on 06/29/2016 06/06/16   Eugenie Filler, MD  aspirin 81 MG chewable tablet Chew 1 tablet (81 mg total) by mouth 2 (two) times daily with a meal. Patient not taking: Reported on 06/29/2016 06/06/16   Eugenie Filler, MD  traMADol (ULTRAM) 50 MG tablet Take 2 tablets (100 mg total) by mouth every 6 (six) hours as needed for moderate pain. Patient not taking: Reported on 06/29/2016 06/06/16   Eugenie Filler, MD    Physical Exam: Vitals:   06/29/16 1430 06/29/16 1445 06/29/16 1500 06/29/16 1600  BP: 153/72 160/83 159/84 162/81  Pulse: 96 98 100 92  Resp: (!) 33 (!) 28 (!) 28 18  Temp:  100.3 F (37.9 C)    TempSrc:  Rectal    SpO2: 98% 100% 99% 99%  Weight:      Height:         General: Appears calm and comfortable Eyes: PERRLA, EOMI, normal lids, iris ENT:  grossly normal hearing, lips & tongue, mucous membranes moist and intact Neck: no lymphoadenopathy, masses or thyromegaly Cardiovascular: RRR, no m/r/g. No JVD, carotid bruits. No LE edema.  Respiratory: bilateral no wheezes, rales, rhonchi or cracles. Normal respiratory  effort. No accessory muscle use observed Abdomen: soft, non-tender, non-distended, no organomegaly or masses appreciated. BS present in all quadrants Skin: no rash, ulcers or induration seen on limited exam Musculoskeletal: grossly normal tone BUE/LLE, good ROM, Right hip joint has limited ROM no bony abnormality or joint deformities observed Psychiatric: grossly normal mood and affect, speech fluent and appropriate, alert and oriented x3 Neurologic: CN II-XII grossly intact, moves all extremities in coordinated fashion, sensation intact  Labs on Admission: I have personally reviewed following labs and imaging studies  CBC, BMP  GFR: Estimated Creatinine Clearance: 63.1 mL/min (by C-G formula based on SCr of 0.64 mg/dL).   Creatinine Clearance: Estimated Creatinine Clearance: 63.1 mL/min (by C-G formula based on SCr of 0.64 mg/dL).   Radiological Exams on Admission: Dg Chest Port 1 View  Result Date: 06/29/2016 CLINICAL DATA:  Hematemesis, recent cardiac catheterization, history hypertension, coronary artery disease EXAM: PORTABLE CHEST 1 VIEW COMPARISON:  Portable exam 1452 hours compared to 06/05/2016 FINDINGS: Normal heart size with note of a coronary arterial stent. Pulmonary vascularity normal. Large hiatal hernia. Lungs clear. No pleural effusion or pneumothorax. Bones demineralized. IMPRESSION: Large hiatal hernia without acute infiltrate. Coronary arterial stent. Electronically Signed   By: Lavonia Dana M.D.   On: 06/29/2016 15:06    EKG: Independently reviewed - sinus rhythm, no acute ST TW changes Assessment/Plan Principal Problem:   GI bleeding Active Problems:   Essential hypertension   Hyperlipidemia   Hypothyroidism, acquired   CAD (coronary artery disease), native coronary artery   Status post hip replacement, right    GI bleeding Patient will be admitted for observation Continue monitoring hemoglobin and hematocrit and transfuse as bleeding continues and she  becomes hemodynamically unstable Discussion was held with cariology regarding antiplatelet therapy - will continue same doses of aspirin and Plavix and hold if patient continues actively bleeding. Currently she is asymptomatic, NG tube was removed after draining clear stomach content Continue IV Protonix bid [facility] was notified about patient's admission and we will follow their recommendation Keep nothing by mouth after midnight for possible endoscopy   Hypertension - currently stable Continue home medication and adjust the doses if needed depending on the BP  readings  CAD with recent NST elevation MI - status post LAD stenting  Continue  Plavix and aspirin as patient stenting of the LAD at the end of January Follow GI recommendations   Hypothyroidism Continue Synthroid and if needed, adjust the dose  Status post right hip replacement Patient was discharged from rehabilitation facility a few days ago Provide assistance with ambulation and monitor for safety to prevent falls    DVT prophylaxis: SCD  Code Status: Full Family Communication: At bedside Disposition Plan: MedSurg Consults called: GI, [facility] called by EDP Admission status: Observation   Caprice Red Pager: [phone] [facility]  If 7PM-7AM, please contact night-coverage [email or web address] Password [email or web address]  06/29/2016, 5:05 PM

## 2016-06-29 NOTE — Progress Notes (Signed)
Caitlyn [name] is a 71 y.o. female patient admitted from ED awake, alert - oriented  X 4 - no acute distress noted.  VSS - Blood pressure (!) 175/71, pulse 90, temperature 99 F (37.2 C), temperature source Oral, resp. rate 20, height 5\' 5"  (1.651 m), weight 71.8 kg (158 lb 3.2 oz), SpO2 99 %.    IV in place, occlusive dsg intact without redness. Pt given Losartan and Carvedilol, PO, and 10 mg hydralazine IV at 1830  Orientation to room, and floor completed with information packet given to patient/family.  Patient declined safety video at this time.  Admission INP armband ID verified with patient/family, and in place.   SR up x 2, fall assessment complete, with patient and family able to verbalize understanding of risk associated with falls, and verbalized understanding to call nsg before up out of bed.  Call light within reach, patient able to voice, and demonstrate understanding.  Skin, clean-dry- intact without evidence of bruising, or skin tears.   No evidence of skin break down noted on exam.     Will cont to eval and treat per MD orders.  Milas Hock, RN 06/29/2016 6:34 PM

## 2016-06-30 DIAGNOSIS — I2510 Atherosclerotic heart disease of native coronary artery without angina pectoris: Secondary | ICD-10-CM

## 2016-06-30 DIAGNOSIS — K922 Gastrointestinal hemorrhage, unspecified: Secondary | ICD-10-CM | POA: Diagnosis not present

## 2016-06-30 DIAGNOSIS — E038 Other specified hypothyroidism: Secondary | ICD-10-CM | POA: Diagnosis not present

## 2016-06-30 DIAGNOSIS — I10 Essential (primary) hypertension: Secondary | ICD-10-CM | POA: Diagnosis not present

## 2016-06-30 LAB — CBC
HCT: 31 % — ABNORMAL LOW (ref 36.0–46.0)
Hemoglobin: 9.7 g/dL — ABNORMAL LOW (ref 12.0–15.0)
MCH: 28.4 pg (ref 26.0–34.0)
MCHC: 31.3 g/dL (ref 30.0–36.0)
MCV: 90.9 fL (ref 78.0–100.0)
PLATELETS: 179 10*3/uL (ref 150–400)
RBC: 3.41 MIL/uL — ABNORMAL LOW (ref 3.87–5.11)
RDW: 15.5 % (ref 11.5–15.5)
WBC: 4.4 10*3/uL (ref 4.0–10.5)

## 2016-06-30 LAB — COMPREHENSIVE METABOLIC PANEL
ALT: 8 U/L — ABNORMAL LOW (ref 14–54)
ANION GAP: 9 (ref 5–15)
AST: 17 U/L (ref 15–41)
Albumin: 3.2 g/dL — ABNORMAL LOW (ref 3.5–5.0)
Alkaline Phosphatase: 72 U/L (ref 38–126)
BILIRUBIN TOTAL: 0.8 mg/dL (ref 0.3–1.2)
BUN: 10 mg/dL (ref 6–20)
CHLORIDE: 109 mmol/L (ref 101–111)
CO2: 22 mmol/L (ref 22–32)
Calcium: 8.6 mg/dL — ABNORMAL LOW (ref 8.9–10.3)
Creatinine, Ser: 0.61 mg/dL (ref 0.44–1.00)
Glucose, Bld: 139 mg/dL — ABNORMAL HIGH (ref 65–99)
POTASSIUM: 3.5 mmol/L (ref 3.5–5.1)
Sodium: 140 mmol/L (ref 135–145)
TOTAL PROTEIN: 5.8 g/dL — AB (ref 6.5–8.1)

## 2016-06-30 LAB — URINE CULTURE

## 2016-06-30 LAB — HEMOGLOBIN: Hemoglobin: 9.4 g/dL — ABNORMAL LOW (ref 12.0–15.0)

## 2016-06-30 LAB — HEMATOCRIT: HCT: 29.4 % — ABNORMAL LOW (ref 36.0–46.0)

## 2016-06-30 MED ORDER — BOOST / RESOURCE BREEZE PO LIQD
1.0000 | Freq: Two times a day (BID) | ORAL | Status: DC
  Administered 2016-06-30 – 2016-07-01 (×2): 1 via ORAL

## 2016-06-30 NOTE — Care Management Obs Status (Signed)
[facility] NOTIFICATION   Patient Details  Name: Caitlyn [name] MRN: [id number] Date of Birth: 1945-11-01   Medicare Observation Status Notification Given:  Yes (pt refused to sign)    Sharin Mons, RN 06/30/2016, 3:32 PM

## 2016-06-30 NOTE — Progress Notes (Signed)
Dr. Charlies Silvers paged ie pt complaints of stomach cramping, needing stool softeners and pt questioning when the doctor was going to see her.

## 2016-06-30 NOTE — Progress Notes (Signed)
   06/30/16 0945  Clinical Encounter Type  Visited With Patient  Visit Type Other (Comment) ([place] consult)  Spiritual Encounters  Spiritual Needs Prayer  Stress Factors  Patient Stress Factors None identified  Introduction to Pt. Offered prayer of hope and peace.

## 2016-06-30 NOTE — Progress Notes (Addendum)
Patient ID: Caitlyn [name], female   DOB: 1946/01/31, 71 y.o.   MRN: [id number]  PROGRESS NOTE    Caitlyn [name]  [id number] DOB: August 05, 1945 DOA: 06/29/2016  PCP: Henrine Screws, MD   Brief Narrative:  71 y.o. female with medical history significant for coronary artery disease, recent non-ST elevation MI treated with stent placement to the LAD and on dual antiplatelet therapy with aspirin and plavix, status post hip replacement, recently discharged from rehabilitation facility and presented today to the emergency department with complaints of vomiting blood. Of note, while in the rehabilitation facility patient developed UTI symptoms - frequent urination and lower abdominal pressure. She related symptoms to her physician and was placed on Macrobid. After taking one dose of Macrobid she developed severe episodes of vomiting with dark contents. She also had some upper abdominal discomfort and in addition had dark stool.  She was seen by her PCP prior to the admission and had positive Hemoccult test and she was referred to the emergency room for further  evaluation.  Patient was hemodynamically stable on the admission. Her hemoglobin was 11.8. Fecal occult blood test was positive. No evidence of infection on urinalysis.    Assessment & Plan:   Principal Problem: Acute upper GI bleeding / Acute blood loss anemia  - Hemoglobin on admission 11.8 - Hemoglobin this am 9.7 - Per cardio, supposed to be on asa and plavix due to recent stent placement to LAD - Pt recently seen by [facility] for iron deficiency anemia but now refused to be seen by [facility] and wants [facility] so I called back [facility]. I spoek with Dr. Amedeo Plenty and based on recent [facility] eval for ID , feel like we can just continue conservative approach at this point with monitoring CBC, continuing PPI therapy and if anything changes to call GI in a.m. Per recent GI office visit, patient has had at least 2 negative endoscopies and was referred to  hematology for IV iron infusion. - Continue to monitor daily CBC - Continue protonix 40 mg IV Q 12 hours   Active Problems:   CAD native artery / recent stent to LAD - Continue aspirin and plavix - No chest pain     Essential hypertension - Continue carvedilol and losartan     Depression - Continue celexa     Hypothyroidism - Continue synthroid   DVT prophylaxis: Asa, plavix, SCD's bilaterally  Code Status: full code  Family Communication: no family at the bedside  Disposition Plan: home in am if hgb stable    Consultants:   [facility] - no official consultation requetedc  Procedures:   None   Antimicrobials:   None    Subjective: No overnight events.  Objective: Vitals:   06/29/16 1600 06/29/16 1755 06/29/16 2148 06/30/16 0558  BP: 162/81 (!) 175/71 (!) 146/68 (!) 149/72  Pulse: 92 90 88 87  Resp: 18 20 18 18   Temp:  99 F (37.2 C) 98.4 F (36.9 C) 98.3 F (36.8 C)  TempSrc:  Oral Oral Oral  SpO2: 99%  97% 99%  Weight:      Height:        Intake/Output Summary (Last 24 hours) at 06/30/16 1012 Last data filed at 06/30/16 0935  Gross per 24 hour  Intake            907.5 ml  Output              175 ml  Net  732.5 ml   Filed Weights   06/29/16 1214  Weight: 71.8 kg (158 lb 3.2 oz)    Examination:  General exam: Appears calm and comfortable  Respiratory system: Clear to auscultation. Respiratory effort normal. Cardiovascular system: S1 & S2 heard, RRR. No JVD, murmurs, rubs, gallops or clicks. No pedal edema. Gastrointestinal system: Abdomen is nondistended, soft and nontender. No organomegaly or masses felt. Normal bowel sounds heard. Central nervous system: Alert and oriented. No focal neurological deficits. Extremities: Symmetric 5 x 5 power. Skin: No rashes, lesions or ulcers Psychiatry: Judgement and insight appear normal. Mood & affect appropriate.   Data Reviewed: I have personally reviewed following labs and imaging  studies  CBC:  Recent Labs Lab 06/29/16 1223 06/29/16 1822 06/30/16 0051 06/30/16 0834  WBC 7.2  --   --  4.4  HGB 11.8* 9.7* 9.4* 9.7*  HCT 36.6 30.6* 29.4* 31.0*  MCV 89.5  --   --  90.9  PLT 226  --   --  0000000   Basic Metabolic Panel:  Recent Labs Lab 06/29/16 1223 06/30/16 0834  NA 138 140  K 3.8 3.5  CL 105 109  CO2 20* 22  GLUCOSE 144* 139*  BUN 18 10  CREATININE 0.64 0.61  CALCIUM 9.6 8.6*   GFR: Estimated Creatinine Clearance: 63.1 mL/min (by C-G formula based on SCr of 0.61 mg/dL). Liver Function Tests:  Recent Labs Lab 06/29/16 1223 06/30/16 0834  AST 17 17  ALT 10* 8*  ALKPHOS 86 72  BILITOT 0.9 0.8  PROT 7.1 5.8*  ALBUMIN 3.8 3.2*   No results for input(s): LIPASE, AMYLASE in the last 168 hours. No results for input(s): AMMONIA in the last 168 hours. Coagulation Profile:  Recent Labs Lab 06/29/16 1223  INR 1.11   Cardiac Enzymes: No results for input(s): CKTOTAL, CKMB, CKMBINDEX, TROPONINI in the last 168 hours. BNP (last 3 results) No results for input(s): PROBNP in the last 8760 hours. HbA1C: No results for input(s): HGBA1C in the last 72 hours. CBG: No results for input(s): GLUCAP in the last 168 hours. Lipid Profile: No results for input(s): CHOL, HDL, LDLCALC, TRIG, CHOLHDL, LDLDIRECT in the last 72 hours. Thyroid Function Tests: No results for input(s): TSH, T4TOTAL, FREET4, T3FREE, THYROIDAB in the last 72 hours. Anemia Panel: No results for input(s): VITAMINB12, FOLATE, FERRITIN, TIBC, IRON, RETICCTPCT in the last 72 hours. Urine analysis:    Component Value Date/Time   COLORURINE YELLOW 06/29/2016 1423   APPEARANCEUR HAZY (A) 06/29/2016 1423   LABSPEC 1.023 06/29/2016 1423   PHURINE 5.0 06/29/2016 1423   GLUCOSEU NEGATIVE 06/29/2016 1423   HGBUR NEGATIVE 06/29/2016 [place] 06/29/2016 1423   KETONESUR NEGATIVE 06/29/2016 1423   PROTEINUR 100 (A) 06/29/2016 1423   UROBILINOGEN 0.2 08/29/2013 0846    NITRITE NEGATIVE 06/29/2016 1423   LEUKOCYTESUR NEGATIVE 06/29/2016 1423   Sepsis Labs: @LABRCNTIP (procalcitonin:4,lacticidven:4)   )No results found for this or any previous visit (from the past 240 hour(s)).    Radiology Studies: Dg Chest Port 1 View  Result Date: 06/29/2016 CLINICAL DATA:  Hematemesis, recent cardiac catheterization, history hypertension, coronary artery disease EXAM: PORTABLE CHEST 1 VIEW COMPARISON:  Portable exam 1452 hours compared to 06/05/2016 FINDINGS: Normal heart size with note of a coronary arterial stent. Pulmonary vascularity normal. Large hiatal hernia. Lungs clear. No pleural effusion or pneumothorax. Bones demineralized. IMPRESSION: Large hiatal hernia without acute infiltrate. Coronary arterial stent. Electronically Signed   By: Crist Infante.D.  On: 06/29/2016 15:06        Scheduled Meds: . aspirin EC  81 mg Oral Daily  . carvedilol  12.5 mg Oral BID WC  . citalopram  20 mg Oral Daily  . clopidogrel  75 mg Oral Q breakfast  . levothyroxine  88 mcg Oral QAC breakfast  . losartan  25 mg Oral Daily  . pantoprazole (PROTONIX) IV  40 mg Intravenous Q12H   Continuous Infusions: . sodium chloride 75 mL/hr at 06/30/16 0844     LOS: 0 days    Time spent: 25 minutes  Greater than 50% of the time spent on counseling and coordinating the care.   Leisa Lenz, MD [facility] Pager [phone]  If 7PM-7AM, please contact night-coverage [email or web address] Password [email or web address] 06/30/2016, 10:12 AM

## 2016-06-30 NOTE — Progress Notes (Signed)
Initial Nutrition Assessment  DOCUMENTATION CODES:   Not applicable  INTERVENTION:   -Boost Breeze po BID, each supplement provides 250 kcal and 9 grams of protein  NUTRITION DIAGNOSIS:   Inadequate oral intake related to altered GI function, poor appetite as evidenced by per patient/family report.  GOAL:   Patient will meet greater than or equal to 90% of their needs  MONITOR:   PO intake, Supplement acceptance, Diet advancement, Labs, Weight trends, Skin, I & O's  REASON FOR ASSESSMENT:   Malnutrition Screening Tool    ASSESSMENT:   Caitlyn [name] is a 71 y.o. female with medical history significant of coronary artery disease with recent non-ST elevation MI treated with stents placement to the LAD and on dual antiplatelet therapy, status post hip replacement after a full rule out again he fracture who was recently discharged from rehabilitation facility and presented today to the emergency department with complaints of bloody emesis  Pt admitted with GIB.   Spoke with pt at bedside, who confirmed recent hospitalization for his fx and surgery on 06/03/16. She shares she went to short term SNF after discharge and just returned home last week. Appetite has been fair- pt typically consumes 3 meals per day (Breakfast: eggs and toast, lunch: yogurt and crackers, dinner: meat, starch, and vegetable). Pt reports appetite has been poor over the past 2 days due to inability to keep foods down.   Pt reports UBW is around 160#. She estimates she has lost about 2-3 pounds within the past week, which is consistent with wt hx.   Pt was just advanced to full liquids and is eagerly awaiting lunch. She reports she does not like Ensure supplements, but received Boost Breeze supplements at SNF and would like to continue them here. Discussed allowed food items on full liquid diet.   Nutrition-Focused physical exam completed. Findings are no fat depletion, mild muscle depletion, and no edema. Muscle  depletion was noticed in lower extremity areas only, which is likely related to recent surgery and rehab stay.   Labs reviewed.  Diet Order:  Diet full liquid Room service appropriate? Yes; Fluid consistency: Thin  Skin:  Reviewed, no issues  Last BM:  06/29/16  Height:   Ht Readings from Last 1 Encounters:  06/29/16 5\' 5"  (1.651 m)    Weight:   Wt Readings from Last 1 Encounters:  06/29/16 158 lb 3.2 oz (71.8 kg)    Ideal Body Weight:  56.8 kg  BMI:  Body mass index is 26.33 kg/m.  Estimated Nutritional Needs:   Kcal:  1600-1800  Protein:  75-90 grams  Fluid:  1.6-1.8 L  EDUCATION NEEDS:   Education needs addressed  Nadie Fiumara A. Jimmye Norman, RD, LDN, CDE Pager: [phone] After hours Pager: [phone]

## 2016-07-01 DIAGNOSIS — K2971 Gastritis, unspecified, with bleeding: Secondary | ICD-10-CM

## 2016-07-01 LAB — CBC
HCT: 33.2 % — ABNORMAL LOW (ref 36.0–46.0)
Hemoglobin: 10.4 g/dL — ABNORMAL LOW (ref 12.0–15.0)
MCH: 28.7 pg (ref 26.0–34.0)
MCHC: 31.3 g/dL (ref 30.0–36.0)
MCV: 91.5 fL (ref 78.0–100.0)
PLATELETS: 197 10*3/uL (ref 150–400)
RBC: 3.63 MIL/uL — ABNORMAL LOW (ref 3.87–5.11)
RDW: 15.6 % — AB (ref 11.5–15.5)
WBC: 4.8 10*3/uL (ref 4.0–10.5)

## 2016-07-01 MED ORDER — ASPIRIN 81 MG PO TBEC: 81.0000 mg | DELAYED_RELEASE_TABLET | Freq: Every day | ORAL | 1 refills | Status: DC

## 2016-07-01 NOTE — Progress Notes (Signed)
Caitlyn [name] to be D/C'd Home per MD order.  Discussed with the patient and all questions fully answered.  VSS, Skin clean, dry and intact without evidence of skin break down, no evidence of skin tears noted. IV catheter discontinued intact. Site without signs and symptoms of complications. Dressing and pressure applied.  An After Visit Summary was printed and given to the patient. Patient received prescription.  D/c education completed with patient/family including follow up instructions, medication list, d/c activities limitations if indicated, with other d/c instructions as indicated by MD - patient able to verbalize understanding, all questions fully answered.   Patient instructed to return to ED, call 911, or call MD for any changes in condition.   Patient escorted via [place], and D/C home via private auto.  Caitlyn [name] 07/01/2016 12:14 PM

## 2016-07-01 NOTE — Discharge Summary (Signed)
Physician Discharge Summary  Caitlyn [name] [id number] DOB: 08/03/1945 DOA: 06/29/2016  PCP: Henrine Screws, MD  Admit date: 06/29/2016 Discharge date: 07/01/2016  Admitted From: home Disposition:  home  Recommendations for Outpatient Follow-up:  1. Follow up with PCP in 1-2 weeks 2. Follow up with [facility] as an outpatient in 1-2 weeks  Home Health: none Equipment/Devices: none  Discharge Condition: stable CODE STATUS: Full Diet recommendation: heart healthy  HPI: Caitlyn [name] is a 71 y.o. female with medical history significant of coronary artery disease with recent non-ST elevation MI treated with stents placement to the LAD and on dual antiplatelet therapy, status post hip replacement after a full rule out again he fracture who was recently discharged from rehabilitation facility and presented today to the emergency department with complaints of bloody emesis While in the rehabilitation facility patient developed UTI symptoms - frequent urination and lower abdominal pressure. She related symptoms to her physician and was placed on Macrobid. After taking one dose of Macrobid she developed severe episodes of vomiting with dark contents. She also had some upper abdominal discomfort and in addition had dark stool. Currently patient is on aspirin and Plavix for recent coronary stent placement and has no previous history of GI bleeding She was seen today by her PCP and had positive Hemoccult test based on which she was referred to the emergency room for further evaluation and treatment   Hospital Course: Discharge Diagnoses:  Principal Problem:   GI bleeding Active Problems:   Essential hypertension   Hyperlipidemia   Hypothyroidism, acquired   CAD (coronary artery disease), native coronary artery   Status post hip replacement, right   Acute GI bleeding   History of cardiac cath  Acute upper GI bleeding / Acute blood loss anemia - her bleeding has resolved, Hb stable, in  fact increasing to 10.4 on the day of discharge. Dr. Charlies Silvers discussed with cardiology, she recently had a stent placement and needs to be on aspirin as well as Plavix.  These were continued in the hospital without any further evidence of bleeding.  Dr. Charlies Silvers discussed with Dr. Amedeo Plenty from GI, and based on recent [facility] evaluation as an outpatient he recommends conservative approach at this point.  Since she is not bleeding, her hemoglobin is stable, she will need outpatient follow-up with gastroenterology. Per recent GI office visit, patient has had at least 2 negative endoscopies and was referred to hematology for IV iron infusion. CAD native artery / recent stent to LAD - Continue aspirin and plavix, no chest pain  Essential hypertension - Continue carvedilol and losartan  Depression - Continue celexa  Hypothyroidism - Continue synthroid  Discharge Instructions   Allergies as of 07/01/2016      Reactions   Cortisone Anaphylaxis   Some forms of cortisone   Monosodium Glutamate Anaphylaxis   Prednisone Anaphylaxis   confusion   Shellfish Allergy Anaphylaxis   Buprenorphine Hcl Nausea And Vomiting, Hypertension   SHAKING   Morphine And Related Nausea And Vomiting, Other (See Comments), Hypertension   SHAKING   Readi-cat [barium Sulfate] Diarrhea   Patient does not want to drink Readi-Cat because it gives her diarrhea   Demerol [meperidine]    UNSPECIFIED REACTION    Sulfa Antibiotics    UNSPECIFIED REACTION    Xanax [alprazolam] Other (See Comments)   "makes me crazy"   Benzoic Acid Nausea Only      Medication List    TAKE these medications   aspirin 81 MG EC  tablet Take 1 tablet (81 mg total) by mouth daily. Start taking on:  07/02/2016   carvedilol 12.5 MG tablet Commonly known as:  COREG Take 1 tablet (12.5 mg total) by mouth 2 (two) times daily with a meal.   citalopram 20 MG tablet Commonly known as:  CELEXA Take 20 mg by mouth daily.   clopidogrel 75 MG  tablet Commonly known as:  PLAVIX Take 1 tablet (75 mg total) by mouth daily with breakfast.   CREON 36000 UNITS Cpep capsule Generic drug:  lipase/protease/amylase Take 36,000 Units by mouth daily. May take another 36,000 units if needed   feeding supplement Liqd Take 1 Container by mouth 2 (two) times daily between meals.   HYDROcodone-acetaminophen 7.5-325 MG tablet Commonly known as:  NORCO Take 1-2 tablets by mouth every 6 (six) hours as needed for moderate pain.   levothyroxine 88 MCG tablet Commonly known as:  SYNTHROID, LEVOTHROID Take 88 mcg by mouth daily.   losartan 25 MG tablet Commonly known as:  COZAAR Take 1 tablet (25 mg total) by mouth daily.   methocarbamol 500 MG tablet Commonly known as:  ROBAXIN Take 1 tablet (500 mg total) by mouth every 6 (six) hours as needed for muscle spasms.   omeprazole 40 MG capsule Commonly known as:  PRILOSEC Take 1 capsule (40 mg total) by mouth daily.   rosuvastatin 10 MG tablet Commonly known as:  CRESTOR Take 10 mg by mouth every other day.      Follow-up Information    HAYES,JOHN C, MD. Schedule an appointment as soon as possible for a visit in 1 week(s).   Specialty:  Gastroenterology Contact information: [id number] [place] [address] [phone]          Allergies  Allergen Reactions  . Cortisone Anaphylaxis    Some forms of cortisone  . Monosodium Glutamate Anaphylaxis  . Prednisone Anaphylaxis    confusion  . Shellfish Allergy Anaphylaxis  . Buprenorphine Hcl Nausea And Vomiting and Hypertension    SHAKING  . Morphine And Related Nausea And Vomiting, Other (See Comments) and Hypertension    SHAKING  . Readi-Cat [Barium Sulfate] Diarrhea    Patient does not want to drink Readi-Cat because it gives her diarrhea  . Demerol [Meperidine]     UNSPECIFIED REACTION   . Sulfa Antibiotics     UNSPECIFIED REACTION   . Xanax [Alprazolam] Other (See Comments)    "makes me crazy"  .  Benzoic Acid Nausea Only    Consultations:  Dr. Amedeo Plenty over the phone  Procedures/Studies:  None  Dg Chest 2 View  Result Date: 06/05/2016 CLINICAL DATA:  Chest pain EXAM: CHEST  2 VIEW COMPARISON:  05/30/2016 FINDINGS: Cardiac shadow is within normal limits. Large hiatal hernia is again noted and stable. The lungs are clear bilaterally. No acute bony abnormality is seen. IMPRESSION: Large hiatal hernia.  No acute abnormality noted. Electronically Signed   By: Inez Catalina M.D.   On: 06/05/2016 13:33   Dg Chest Port 1 View  Result Date: 06/29/2016 CLINICAL DATA:  Hematemesis, recent cardiac catheterization, history hypertension, coronary artery disease EXAM: PORTABLE CHEST 1 VIEW COMPARISON:  Portable exam 1452 hours compared to 06/05/2016 FINDINGS: Normal heart size with note of a coronary arterial stent. Pulmonary vascularity normal. Large hiatal hernia. Lungs clear. No pleural effusion or pneumothorax. Bones demineralized. IMPRESSION: Large hiatal hernia without acute infiltrate. Coronary arterial stent. Electronically Signed   By: Lavonia Dana M.D.   On: 06/29/2016  15:06   Dg C-arm 1-60 Min  Result Date: 06/03/2016 CLINICAL DATA:  Right femoral intramedullary nail placement. Initial encounter. EXAM: OPERATIVE RIGHT HIP WITH PELVIS COMPARISON:  Right hip radiographs performed 05/28/2016 FINDINGS: Two fluoroscopic C-arm images are provided from the OR. These demonstrate placement of a right-sided dynamic hip screw, transfixing the patient's intertrochanteric fracture in grossly anatomic alignment. No new fractures are seen. The right femoral head remains seated at the acetabulum. IMPRESSION: Status post internal fixation of right femoral fracture in grossly anatomic alignment. Electronically Signed   By: Garald Balding M.D.   On: 06/03/2016 21:03   Dg Hip Port Unilat With Pelvis 1v Right  Result Date: 06/03/2016 CLINICAL DATA:  Status post placement of right femoral intramedullary nail.  Initial encounter. EXAM: DG HIP (WITH OR WITHOUT PELVIS) 1V PORT RIGHT COMPARISON:  Right hip radiographs performed 05/28/2016 FINDINGS: The patient is status post placement of an intramedullary nail and hip screw at the proximal right femur, transfixing the patient's right femoral fracture in grossly anatomic alignment. No new fractures are seen. The right femoral head remains seated at the acetabulum. Overlying postoperative soft tissue air is seen. IMPRESSION: Status post internal fixation of right femoral fracture in grossly anatomic alignment Electronically Signed   By: Garald Balding M.D.   On: 06/03/2016 20:26   Dg Hip Operative Unilat W Or W/o Pelvis Right  Result Date: 06/03/2016 CLINICAL DATA:  Right femoral intramedullary nail placement. Initial encounter. EXAM: OPERATIVE RIGHT HIP WITH PELVIS COMPARISON:  Right hip radiographs performed 05/28/2016 FINDINGS: Two fluoroscopic C-arm images are provided from the OR. These demonstrate placement of a right-sided dynamic hip screw, transfixing the patient's intertrochanteric fracture in grossly anatomic alignment. No new fractures are seen. The right femoral head remains seated at the acetabulum. IMPRESSION: Status post internal fixation of right femoral fracture in grossly anatomic alignment. Electronically Signed   By: Garald Balding M.D.   On: 06/03/2016 21:03      Subjective: - no chest pain, shortness of breath, no abdominal pain, nausea or vomiting.   Discharge Exam: Vitals:   07/01/16 0721 07/01/16 0817  BP: (!) 173/73 (!) 157/73  Pulse: 79 75  Resp:    Temp:     Vitals:   06/30/16 1703 06/30/16 2147 07/01/16 0721 07/01/16 0817  BP: (!) 152/84 (!) 144/64 (!) 173/73 (!) 157/73  Pulse: 75 72 79 75  Resp:  18    Temp:  98.4 F (36.9 C)    TempSrc:  Oral    SpO2:  97% 97%   Weight:      Height:        General: Pt is alert, awake, not in acute distress Cardiovascular: RRR, S1/S2 +, no rubs, no gallops Respiratory: CTA  bilaterally, no wheezing, no rhonchi Abdominal: Soft, NT, ND, bowel sounds + Extremities: no edema, no cyanosis    The results of significant diagnostics from this hospitalization (including imaging, microbiology, ancillary and laboratory) are listed below for reference.     Microbiology: Recent Results (from the past 240 hour(s))  Urine culture     Status: Abnormal   Collection Time: 06/29/16  2:45 PM  Result Value Ref Range Status   Specimen Description URINE, RANDOM  Final   Special Requests NONE  Final   Culture MULTIPLE SPECIES PRESENT, SUGGEST RECOLLECTION (A)  Final   Report Status 06/30/2016 FINAL  Final     Labs: BNP (last 3 results) No results for input(s): BNP in the last 8760 hours. Basic Metabolic  Panel:  Recent Labs Lab 06/29/16 1223 06/30/16 0834  NA 138 140  K 3.8 3.5  CL 105 109  CO2 20* 22  GLUCOSE 144* 139*  BUN 18 10  CREATININE 0.64 0.61  CALCIUM 9.6 8.6*   Liver Function Tests:  Recent Labs Lab 06/29/16 1223 06/30/16 0834  AST 17 17  ALT 10* 8*  ALKPHOS 86 72  BILITOT 0.9 0.8  PROT 7.1 5.8*  ALBUMIN 3.8 3.2*   No results for input(s): LIPASE, AMYLASE in the last 168 hours. No results for input(s): AMMONIA in the last 168 hours. CBC:  Recent Labs Lab 06/29/16 1223 06/29/16 1822 06/30/16 0051 06/30/16 0834 07/01/16 0729  WBC 7.2  --   --  4.4 4.8  HGB 11.8* 9.7* 9.4* 9.7* 10.4*  HCT 36.6 30.6* 29.4* 31.0* 33.2*  MCV 89.5  --   --  90.9 91.5  PLT 226  --   --  179 197   Cardiac Enzymes: No results for input(s): CKTOTAL, CKMB, CKMBINDEX, TROPONINI in the last 168 hours. BNP: Invalid input(s): POCBNP CBG: No results for input(s): GLUCAP in the last 168 hours. D-Dimer No results for input(s): DDIMER in the last 72 hours. Hgb A1c No results for input(s): HGBA1C in the last 72 hours. Lipid Profile No results for input(s): CHOL, HDL, LDLCALC, TRIG, CHOLHDL, LDLDIRECT in the last 72 hours. Thyroid function studies No results  for input(s): TSH, T4TOTAL, T3FREE, THYROIDAB in the last 72 hours.  Invalid input(s): FREET3 Anemia work up No results for input(s): VITAMINB12, FOLATE, FERRITIN, TIBC, IRON, RETICCTPCT in the last 72 hours. Urinalysis    Component Value Date/Time   COLORURINE YELLOW 06/29/2016 1423   APPEARANCEUR HAZY (A) 06/29/2016 1423   LABSPEC 1.023 06/29/2016 1423   PHURINE 5.0 06/29/2016 1423   GLUCOSEU NEGATIVE 06/29/2016 1423   HGBUR NEGATIVE 06/29/2016 [place] 06/29/2016 1423   KETONESUR NEGATIVE 06/29/2016 1423   PROTEINUR 100 (A) 06/29/2016 1423   UROBILINOGEN 0.2 08/29/2013 0846   NITRITE NEGATIVE 06/29/2016 1423   LEUKOCYTESUR NEGATIVE 06/29/2016 1423   Sepsis Labs Invalid input(s): PROCALCITONIN,  WBC,  LACTICIDVEN Microbiology Recent Results (from the past 240 hour(s))  Urine culture     Status: Abnormal   Collection Time: 06/29/16  2:45 PM  Result Value Ref Range Status   Specimen Description URINE, RANDOM  Final   Special Requests NONE  Final   Culture MULTIPLE SPECIES PRESENT, SUGGEST RECOLLECTION (A)  Final   Report Status 06/30/2016 FINAL  Final     Time coordinating discharge: 40 minutes  SIGNED:  Marzetta Board, MD  [facility] 07/01/2016, 2:51 PM Pager [phone]  If 7PM-7AM, please contact night-coverage [email or web address] Password [email or web address]

## 2016-07-01 NOTE — Care Management Note (Signed)
Case Management Note  Patient Details  Name: Caitlyn [name] MRN: [id number] Date of Birth: 1945-10-26  Subjective/Objective:                 Patient in obs for GIB. Per progression report of beside RN, anticipate DC to home today.    Action/Plan:   No CM needs identified.  Expected Discharge Date:  07/03/16               Expected Discharge Plan:  Home/Self Care  In-House Referral:     Discharge planning Services  CM Consult  Post Acute Care Choice:    Choice offered to:     DME Arranged:    DME Agency:     HH Arranged:    HH Agency:     Status of Service:  Completed, signed off  If discussed at [employer] of Stay Meetings, dates discussed:    Additional Comments:  Carles Collet, RN 07/01/2016, 10:54 AM

## 2016-07-09 ENCOUNTER — Telehealth (HOSPITAL_COMMUNITY): Payer: Self-pay | Admitting: Internal Medicine

## 2016-07-09 NOTE — Telephone Encounter (Signed)
Per notes pt is being seen for PT/Rehab at [facility].... KJ

## 2016-07-28 ENCOUNTER — Telehealth (HOSPITAL_COMMUNITY): Payer: Self-pay | Admitting: Internal Medicine

## 2016-07-28 NOTE — Telephone Encounter (Signed)
S/w pt with interest in the Cardiac Rehab Program, pt can't do right now but is interested in maybe later. Would like a brochure sent out to her, mailed out brochure and pt will c/b later if interested.... KJ

## 2016-07-30 ENCOUNTER — Telehealth: Payer: Self-pay | Admitting: Cardiovascular Disease

## 2016-07-30 NOTE — Telephone Encounter (Signed)
Returned call to patient-patient states she was called in regards to cardiac rehab this week and was unsure about it.  Reports she just finished PT today and wanted to know if Dr. [name] recommends her starting cardiac rehab.  Patient states she will be glad to do if Dr. [name] recommends doing so.    Advised patient of benefits and purpose of cardiac rehab-patient is interested but wanted to make sure this was necessary if she just finished PT.    Routed to Dr. [name] for recommendations.  Patient aware

## 2016-07-30 NOTE — Telephone Encounter (Signed)
New Message     Pt called to go over information about the cardiac rehab

## 2016-07-31 NOTE — Telephone Encounter (Signed)
Returned call and made patient aware.  Verbalized understanding.

## 2016-07-31 NOTE — Telephone Encounter (Signed)
If she finished physical therapy no reason to start rehabilitation given that she only had a mid LAD stent.

## 2016-08-28 ENCOUNTER — Other Ambulatory Visit: Payer: Self-pay | Admitting: Oncology

## 2016-08-28 DIAGNOSIS — D508 Other iron deficiency anemias: Secondary | ICD-10-CM

## 2016-08-28 DIAGNOSIS — K909 Intestinal malabsorption, unspecified: Secondary | ICD-10-CM

## 2016-08-31 ENCOUNTER — Other Ambulatory Visit: Payer: Medicare Other

## 2016-09-01 ENCOUNTER — Encounter: Payer: Medicare Other | Admitting: Oncology

## 2016-09-01 ENCOUNTER — Telehealth: Payer: Self-pay | Admitting: Internal Medicine

## 2016-09-01 ENCOUNTER — Ambulatory Visit: Payer: Medicare Other | Admitting: Gastroenterology

## 2016-09-01 NOTE — Telephone Encounter (Signed)
APT. REMINDER CALL, LMTCB °

## 2016-09-02 ENCOUNTER — Other Ambulatory Visit: Payer: Medicare Other

## 2016-09-04 ENCOUNTER — Other Ambulatory Visit (INDEPENDENT_AMBULATORY_CARE_PROVIDER_SITE_OTHER): Payer: Medicare Other

## 2016-09-04 ENCOUNTER — Encounter (INDEPENDENT_AMBULATORY_CARE_PROVIDER_SITE_OTHER): Payer: Self-pay

## 2016-09-04 DIAGNOSIS — D508 Other iron deficiency anemias: Secondary | ICD-10-CM

## 2016-09-04 DIAGNOSIS — K909 Intestinal malabsorption, unspecified: Secondary | ICD-10-CM | POA: Diagnosis not present

## 2016-09-05 LAB — IRON AND TIBC
IRON SATURATION: 7 % — AB (ref 15–55)
Iron: 29 ug/dL (ref 27–139)
TIBC: 390 ug/dL (ref 250–450)
UIBC: 361 ug/dL (ref 118–369)

## 2016-09-05 LAB — CBC WITH DIFFERENTIAL/PLATELET
BASOS ABS: 0 10*3/uL (ref 0.0–0.2)
BASOS: 1 %
EOS (ABSOLUTE): 0.1 10*3/uL (ref 0.0–0.4)
Eos: 2 %
HEMOGLOBIN: 11.1 g/dL (ref 11.1–15.9)
Hematocrit: 34.1 % (ref 34.0–46.6)
IMMATURE GRANS (ABS): 0 10*3/uL (ref 0.0–0.1)
Immature Granulocytes: 0 %
LYMPHS ABS: 2.3 10*3/uL (ref 0.7–3.1)
LYMPHS: 32 %
MCH: 26 pg — AB (ref 26.6–33.0)
MCHC: 32.6 g/dL (ref 31.5–35.7)
MCV: 80 fL (ref 79–97)
MONOCYTES: 7 %
Monocytes Absolute: 0.5 10*3/uL (ref 0.1–0.9)
NEUTROS ABS: 4.1 10*3/uL (ref 1.4–7.0)
Neutrophils: 58 %
Platelets: 266 10*3/uL (ref 150–379)
RBC: 4.27 x10E6/uL (ref 3.77–5.28)
RDW: 14.9 % (ref 12.3–15.4)
WBC: 7 10*3/uL (ref 3.4–10.8)

## 2016-09-05 LAB — FERRITIN: FERRITIN: 17 ng/mL (ref 15–150)

## 2016-09-07 ENCOUNTER — Telehealth: Payer: Self-pay | Admitting: *Deleted

## 2016-09-07 ENCOUNTER — Other Ambulatory Visit: Payer: Self-pay | Admitting: Oncology

## 2016-09-07 DIAGNOSIS — Z8719 Personal history of other diseases of the digestive system: Secondary | ICD-10-CM

## 2016-09-07 DIAGNOSIS — K909 Intestinal malabsorption, unspecified: Secondary | ICD-10-CM

## 2016-09-07 NOTE — Telephone Encounter (Signed)
Pt called / informed "iron level low (Ferritin-27 and iron sat - 7) per Dr Beryle Beams. Stated she can usually tell when it's low; she has not been feeling good lately. Informed will need Iron infusions on 5/7 and 5/14 after appt. Appt scheduled May 7 @ 0900 AM - reminded to go to Admission to register prior to infusion. And to schedule next infusion after her first one; reminded of her appt w/Dr G on the 14th. Told Laverne pt has appt w/ Dr Darnell Level on the 14th.

## 2016-09-07 NOTE — Telephone Encounter (Signed)
-----   Message from Annia Belt, MD sent at 09/07/2016 10:58 AM EDT ----- Call pt: iron level low. Please schedule IV iron at short stay for 5/7 & 5/14. She has visit with me on th 14th & can get her 2nd iron same day.

## 2016-09-07 NOTE — Telephone Encounter (Signed)
thanks

## 2016-09-11 ENCOUNTER — Other Ambulatory Visit (HOSPITAL_COMMUNITY): Payer: Self-pay | Admitting: *Deleted

## 2016-09-14 ENCOUNTER — Ambulatory Visit (HOSPITAL_COMMUNITY)
Admission: RE | Admit: 2016-09-14 | Discharge: 2016-09-14 | Disposition: A | Discharge status: Home / Self Care | Payer: Medicare Other | Source: Ambulatory Visit | Attending: Oncology | Admitting: Oncology

## 2016-09-14 DIAGNOSIS — K909 Intestinal malabsorption, unspecified: Secondary | ICD-10-CM | POA: Diagnosis present

## 2016-09-14 DIAGNOSIS — Z8719 Personal history of other diseases of the digestive system: Secondary | ICD-10-CM | POA: Insufficient documentation

## 2016-09-14 MED ORDER — SODIUM CHLORIDE 0.9 % IV SOLN
510.0000 mg | INTRAVENOUS | Status: DC
  Administered 2016-09-14: 10:00:00 510 mg via INTRAVENOUS
  Filled 2016-09-14: qty 17

## 2016-09-21 ENCOUNTER — Encounter: Payer: Self-pay | Admitting: Oncology

## 2016-09-21 ENCOUNTER — Ambulatory Visit (INDEPENDENT_AMBULATORY_CARE_PROVIDER_SITE_OTHER): Payer: Medicare Other | Admitting: Oncology

## 2016-09-21 ENCOUNTER — Ambulatory Visit (HOSPITAL_COMMUNITY)
Admission: RE | Admit: 2016-09-21 | Discharge: 2016-09-21 | Disposition: A | Discharge status: Home / Self Care | Payer: Medicare Other | Source: Ambulatory Visit | Attending: Oncology | Admitting: Oncology

## 2016-09-21 VITALS — BP 148/74 | HR 74 | Temp 98.5°F | Ht 65.0 in | Wt 162.8 lb

## 2016-09-21 DIAGNOSIS — I2510 Atherosclerotic heart disease of native coronary artery without angina pectoris: Secondary | ICD-10-CM

## 2016-09-21 DIAGNOSIS — Z882 Allergy status to sulfonamides status: Secondary | ICD-10-CM

## 2016-09-21 DIAGNOSIS — K449 Diaphragmatic hernia without obstruction or gangrene: Secondary | ICD-10-CM | POA: Diagnosis not present

## 2016-09-21 DIAGNOSIS — S72141D Displaced intertrochanteric fracture of right femur, subsequent encounter for closed fracture with routine healing: Secondary | ICD-10-CM

## 2016-09-21 DIAGNOSIS — K219 Gastro-esophageal reflux disease without esophagitis: Secondary | ICD-10-CM | POA: Diagnosis not present

## 2016-09-21 DIAGNOSIS — Z952 Presence of prosthetic heart valve: Secondary | ICD-10-CM

## 2016-09-21 DIAGNOSIS — D508 Other iron deficiency anemias: Secondary | ICD-10-CM | POA: Diagnosis not present

## 2016-09-21 DIAGNOSIS — W000XXD Fall on same level due to ice and snow, subsequent encounter: Secondary | ICD-10-CM | POA: Diagnosis not present

## 2016-09-21 DIAGNOSIS — Z91013 Allergy to seafood: Secondary | ICD-10-CM

## 2016-09-21 DIAGNOSIS — Z885 Allergy status to narcotic agent status: Secondary | ICD-10-CM

## 2016-09-21 DIAGNOSIS — E039 Hypothyroidism, unspecified: Secondary | ICD-10-CM | POA: Diagnosis not present

## 2016-09-21 DIAGNOSIS — Z951 Presence of aortocoronary bypass graft: Secondary | ICD-10-CM | POA: Diagnosis not present

## 2016-09-21 DIAGNOSIS — Z91041 Radiographic dye allergy status: Secondary | ICD-10-CM

## 2016-09-21 DIAGNOSIS — K909 Intestinal malabsorption, unspecified: Secondary | ICD-10-CM | POA: Insufficient documentation

## 2016-09-21 DIAGNOSIS — Z8719 Personal history of other diseases of the digestive system: Secondary | ICD-10-CM | POA: Insufficient documentation

## 2016-09-21 DIAGNOSIS — Z79899 Other long term (current) drug therapy: Secondary | ICD-10-CM

## 2016-09-21 DIAGNOSIS — Z888 Allergy status to other drugs, medicaments and biological substances status: Secondary | ICD-10-CM

## 2016-09-21 MED ORDER — SODIUM CHLORIDE 0.9 % IV SOLN
510.0000 mg | INTRAVENOUS | Status: AC
  Administered 2016-09-21: 11:00:00 510 mg via INTRAVENOUS
  Filled 2016-09-21: qty 17
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
## 2016-09-21 NOTE — Patient Instructions (Signed)
To short stay today for IV iron - already scheduled Lab in 3 months and repeat in 6 months MD visit in 6 months 1-2 weeks after lab

## 2016-11-02 ENCOUNTER — Other Ambulatory Visit: Payer: Self-pay | Admitting: Internal Medicine

## 2016-11-02 DIAGNOSIS — Z1231 Encounter for screening mammogram for malignant neoplasm of breast: Secondary | ICD-10-CM

## 2016-11-05 ENCOUNTER — Ambulatory Visit
Admission: RE | Admit: 2016-11-05 | Discharge: 2016-11-05 | Disposition: A | Discharge status: Home / Self Care | Payer: Medicare Other | Source: Ambulatory Visit | Attending: Internal Medicine | Admitting: Internal Medicine

## 2016-11-05 DIAGNOSIS — Z1231 Encounter for screening mammogram for malignant neoplasm of breast: Secondary | ICD-10-CM

## 2016-11-20 ENCOUNTER — Other Ambulatory Visit: Payer: Self-pay | Admitting: Nurse Practitioner

## 2016-12-22 ENCOUNTER — Other Ambulatory Visit: Payer: Medicare Other

## 2016-12-23 ENCOUNTER — Other Ambulatory Visit (INDEPENDENT_AMBULATORY_CARE_PROVIDER_SITE_OTHER): Payer: Medicare Other

## 2016-12-23 ENCOUNTER — Telehealth: Payer: Self-pay | Admitting: *Deleted

## 2016-12-23 DIAGNOSIS — D508 Other iron deficiency anemias: Secondary | ICD-10-CM | POA: Diagnosis not present

## 2016-12-23 DIAGNOSIS — Z8719 Personal history of other diseases of the digestive system: Secondary | ICD-10-CM

## 2016-12-23 DIAGNOSIS — K909 Intestinal malabsorption, unspecified: Secondary | ICD-10-CM

## 2016-12-23 LAB — CBC WITH DIFFERENTIAL/PLATELET
BASOS ABS: 0.1 10*3/uL (ref 0.0–0.1)
Basophils Relative: 1 %
Eosinophils Absolute: 0.1 10*3/uL (ref 0.0–0.7)
Eosinophils Relative: 1 %
HEMATOCRIT: 37.2 % (ref 36.0–46.0)
Hemoglobin: 12.9 g/dL (ref 12.0–15.0)
LYMPHS PCT: 25 %
Lymphs Abs: 1.9 10*3/uL (ref 0.7–4.0)
MCH: 30.2 pg (ref 26.0–34.0)
MCHC: 34.7 g/dL (ref 30.0–36.0)
MCV: 87.1 fL (ref 78.0–100.0)
MONO ABS: 0.4 10*3/uL (ref 0.1–1.0)
MONOS PCT: 5 %
NEUTROS ABS: 5.1 10*3/uL (ref 1.7–7.7)
Neutrophils Relative %: 68 %
Platelets: 236 10*3/uL (ref 150–400)
RBC: 4.27 MIL/uL (ref 3.87–5.11)
RDW: 14.7 % (ref 11.5–15.5)
WBC: 7.5 10*3/uL (ref 4.0–10.5)

## 2016-12-23 LAB — FERRITIN: FERRITIN: 48 ng/mL (ref 11–307)
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
## 2016-12-28 ENCOUNTER — Telehealth: Payer: Self-pay | Admitting: *Deleted

## 2016-12-28 NOTE — Telephone Encounter (Signed)
Return pt's call - requesting lab results. Dr Beryle Beams had stated "blood count normal and higher than previous & iron stores good so this does not explain her fatigue". States she has an appt w/her cardiologist on the 31st; also states fatigue occurs after she take "the blood thinner". She will also call her PCP to let him know what's going on.

## 2017-01-04 ENCOUNTER — Encounter: Payer: Medicare Other | Admitting: Oncology

## 2017-01-08 ENCOUNTER — Encounter: Payer: Self-pay | Admitting: Physician Assistant

## 2017-01-08 ENCOUNTER — Ambulatory Visit (INDEPENDENT_AMBULATORY_CARE_PROVIDER_SITE_OTHER): Payer: Medicare Other | Admitting: Physician Assistant

## 2017-01-08 VITALS — BP 157/79 | HR 76 | Ht 65.0 in | Wt 163.2 lb

## 2017-01-08 DIAGNOSIS — I10 Essential (primary) hypertension: Secondary | ICD-10-CM

## 2017-01-08 DIAGNOSIS — I2510 Atherosclerotic heart disease of native coronary artery without angina pectoris: Secondary | ICD-10-CM

## 2017-01-08 DIAGNOSIS — E7800 Pure hypercholesterolemia, unspecified: Secondary | ICD-10-CM

## 2017-01-08 DIAGNOSIS — R0789 Other chest pain: Secondary | ICD-10-CM

## 2017-01-08 MED ORDER — NITROGLYCERIN 0.4 MG SL SUBL: 0.4000 mg | SUBLINGUAL_TABLET | SUBLINGUAL | 1 refills | Status: DC | PRN

## 2017-01-08 NOTE — Progress Notes (Signed)
Cardiology Office Note   Date:  01/08/2017   ID:  Lirio, Bach 1946-02-20, MRN [id number]  PCP:  Josetta Huddle, MD  Cardiologist:  Dr. [name], 06/17/2016  Rosaria Ferries, PA-C   Chief Complaint  Patient presents with  . Follow-up  . Chest Pain    shooting pain in chest.  . Edema    in feet, when on feet too long.     History of Present Illness: Caitlyn [name] is a 71 y.o. female with a history of Synergy DES LAD 05/2016, OA, HTN, anemia, GERD, HLD  Caitlyn [name] presents for cardiology follow up.  She feels she is doing well in general. She gets shooting pain that starts laterally at the lower ribs and goes around to the front, shooting up her chest, then resolves. She thinks this might be her heart.   She went to a SNF for rehab after her surgery. She did well with the rehab, is ambulating well. She never do cardiac rehabilitation. She is not really interested in doing.  She does not get chest pain with exertion. She walks her dog 3 x day. Because of her musculoskeletal issues, she goes very slow. She does floor exercises as well. She is continuing to exercise post-hip surgery.   She has some SOB with activity, she gets a little SOB at rest at times. Her asthma is doing well this year. She seems a little anxious at times, but calms easily.    Past Medical History:  Diagnosis Date  . Anemia   . Arthritis   . Bleeding diathesis ([place]) 02/22/2013  . Closed right hip fracture ([place]) 05/28/2016  . Coronary artery disease   . E coli enteritis   . GERD (gastroesophageal reflux disease)   . H/O hiatal hernia   . Head injury   . Hypertension   . Iron deficiency anemia, unspecified 02/22/2013  . Mass of right kidney 08/22/2013   2.5 cm solid lesion seen on 5/14 CT  . Radicular pain of thoracic region 08/22/2013   Left T-8 started after pushing heavy chest 1/15; intermittent; no focal neuro deficit    Past Surgical History:  Procedure Laterality Date  . ABDOMINAL  HYSTERECTOMY    . BREAST BIOPSY    . BREAST LUMPECTOMY Left   . BREAST REDUCTION SURGERY  1981  . BREAST SURGERY  1981   rt-nodes taken out-non cancer  . BREAST SURGERY  1981   rt lumpectomy  . CARDIAC CATHETERIZATION N/A 06/01/2016   Procedure: LEFT HEART CATH;  Surgeon: Lorretta Harp, MD;  Location: [place] [facility];  Service: Cardiovascular;  Laterality: N/A;  . CARDIAC CATHETERIZATION N/A 06/01/2016   Procedure: Coronary Stent Intervention;  Surgeon: Lorretta Harp, MD;  Location: [place] [facility];  Service: Cardiovascular;  Laterality: N/A;  . COLONOSCOPY    . FACIAL FRACTURE SURGERY  2000  . FEMUR IM NAIL Right 06/03/2016   Procedure: INTRAMEDULLARY (IM) NAIL FEMORAL;  Surgeon: Rod Can, MD;  Location: [place];  Service: Orthopedics;  Laterality: Right;  . TONSILLECTOMY    . TRIGGER FINGER RELEASE Right 11/28/2013   Procedure: RELEASE TRIGGER FINGER/A-1 PULLEY RIGHT LONG FINGER;  Surgeon: Cammie Sickle, MD;  Location: [place];  Service: Orthopedics;  Laterality: Right;  . URETHRA SURGERY     age 57    Current Outpatient Prescriptions  Medication Sig Dispense Refill  . aspirin EC 81 MG EC tablet Take 1 tablet (81 mg total) by mouth  daily. 30 tablet 1  . carvedilol (COREG) 12.5 MG tablet Take 1 tablet (12.5 mg total) by mouth 2 (two) times daily with a meal. 60 tablet 3  . citalopram (CELEXA) 20 MG tablet Take 20 mg by mouth daily.    . clopidogrel (PLAVIX) 75 MG tablet Take 1 tablet (75 mg total) by mouth daily with breakfast. 30 tablet 2  . feeding supplement (BOOST / RESOURCE BREEZE) LIQD Take 1 Container by mouth 2 (two) times daily between meals.  0  . levothyroxine (SYNTHROID, LEVOTHROID) 88 MCG tablet Take 88 mcg by mouth daily.    . lipase/protease/amylase (CREON) 36000 UNITS CPEP capsule Take 36,000 Units by mouth daily. May take another 36,000 units if needed    . losartan (COZAAR) 25 MG tablet Take 1 tablet (25 mg total) by mouth daily.  30 tablet 2  . methocarbamol (ROBAXIN) 500 MG tablet Take 1 tablet (500 mg total) by mouth every 6 (six) hours as needed for muscle spasms. 30 tablet 3  . omeprazole (PRILOSEC) 40 MG capsule Take 1 capsule (40 mg total) by mouth daily. 30 capsule 0  . rosuvastatin (CRESTOR) 10 MG tablet Take 10 mg by mouth every other day.      No current facility-administered medications for this visit.    Facility-Administered Medications Ordered in Other Visits  Medication Dose Route Frequency Provider Last Rate Last Dose  . fentaNYL (SUBLIMAZE) injection    Anesthesia Intra-op Marinda Elk A, CRNA   50 mcg at 05/31/16 0721  . lactated ringers infusion    Continuous PRN Marinda Elk A, CRNA 50 mL/hr at 06/03/16 2300      Allergies:   Cortisone; Monosodium glutamate; Prednisone; Shellfish allergy; Buprenorphine hcl; Morphine and related; Readi-cat [barium sulfate]; Demerol [meperidine]; Sulfa antibiotics; Xanax [alprazolam]; and Benzoic acid    Social History:  The patient  reports that she has never smoked. She has never used smokeless tobacco. She reports that she does not drink alcohol or use drugs.   Family History:  The patient's family history includes Alzheimer's disease in her mother; Colon cancer in her maternal grandfather; Heart attack in her father; Heart disease in her brother and mother.    ROS:  Please see the history of present illness. All other systems are reviewed and negative.    PHYSICAL EXAM: VS:  BP (!) 157/79   Pulse 76   Ht 5\' 5"  (1.651 m)   Wt 163 lb 3.2 oz (74 kg)   BMI 27.16 kg/m  , BMI Body mass index is 27.16 kg/m. GEN: Well nourished, well developed, female in no acute distress  HEENT: normal for age  Neck: no JVD, no carotid bruit, no masses Cardiac: RRR; soft murmur, no rubs, or gallops Respiratory:  clear to auscultation bilaterally, normal work of breathing; chest wall tenderness noted on her lateral ribs and around up under her left breast GI: soft,  nontender, nondistended, + BS MS: no deformity or atrophy; no edema; distal pulses are 2+ in all 4 extremities   Skin: warm and dry, no rash Neuro:  Strength and sensation are intact Psych: euthymic mood, full affect   EKG:  EKG is not ordered today.  ECHO: 06/01/2016 - Left ventricle: The cavity size was normal. Systolic function was   normal. The estimated ejection fraction was in the range of 60%   to 65%. Wall motion was normal; there were no regional wall   motion abnormalities. Doppler parameters are consistent with   abnormal left ventricular relaxation (  grade 1 diastolic dysfunction). - Pulmonary arteries: Systolic pressure was mildly increased. PA   peak pressure: 40 mm Hg (S).  CATH: 06/01/2016 Post-Intervention Diagram         Recent Labs: 06/05/2016: Magnesium 2.3 06/30/2016: ALT 8; BUN 10; Creatinine, Ser 0.61; Potassium 3.5; Sodium 140 12/23/2016: Hemoglobin 12.9; Platelets 236    Lipid Panel No results found for: CHOL, TRIG, HDL, CHOLHDL, VLDL, LDLCALC, LDLDIRECT   Wt Readings from Last 3 Encounters:  01/08/17 163 lb 3.2 oz (74 kg)  09/21/16 162 lb (73.5 kg)  09/21/16 162 lb 12.8 oz (73.8 kg)     Other studies Reviewed: Additional studies/ records that were reviewed today include: Office notes, hospital records and testing.  ASSESSMENT AND PLAN:  1.  Chest pain: She has costochondritis pain that goes across the top of her chest. This is different. However, I showed her her cath diagram and explained why I had no reason to believe the pain was coming from her heart. The symptoms are not associated with exertion, are very brief and it is an electrical shock or shooting pain in her chest. She was reassured by this. I explained that if she has inflammatory pain in one area of her chest she can get it in another area as well.  2. CAD: She is on aspirin, Plavix, a beta blocker and an ARB. She is also on a statin. She is encouraged to increase her activity level.  Because of her musculoskeletal issues, I recommend that she use a gym and a trainer to figure out how to do this safely.  3. Hyperlipidemia: This is followed by her PCP, I requested that the next time she gets labs done, please fax them to [place] so that we may have a copy as well.  4. Hypertension: Her blood pressure was initially elevated, but improved on recheck to 132/76.   Current medicines are reviewed at length with the patient today.  The patient does not have concerns regarding medicines.  The following changes have been made:  no change  Labs/ tests ordered today include:  No orders of the defined types were placed in this encounter.    Disposition:   FU with Dr. [name]  Signed, Rosaria Ferries, PA-C  01/08/2017 10:07 AM    [place] Phone: [phone]; Fax: [phone]  This note was written with the assistance of speech recognition software. Please excuse any transcriptional errors.

## 2017-01-08 NOTE — Patient Instructions (Signed)
Medication Instructions:  NO CHANGES Your physician recommends that you continue on your current medications as directed. Please refer to the Current Medication list given to you today. If you need a refill on your cardiac medications before your next appointment, please call your pharmacy.  Labwork: HAVE PCP FAX LABS TO [phone]  Follow-Up: Your physician wants you to follow-up in: AS SCHEDULED IN February WITH DR [name]. You should receive a reminder letter in the mail two months in advance. If you do not receive a letter, please call our office December 2018 to schedule the February 2019 follow-up appointment.   Special Instructions: PLEASE JOIN A GYM WITH PERSONAL TRAINER TO SHOW YOU CARDIO FITNESS EXERCISES    Thank you for choosing [facility] at [facility]!!

## 2017-01-15 ENCOUNTER — Other Ambulatory Visit: Payer: Self-pay | Admitting: Internal Medicine

## 2017-01-15 ENCOUNTER — Ambulatory Visit
Admission: RE | Admit: 2017-01-15 | Discharge: 2017-01-15 | Disposition: A | Discharge status: Home / Self Care | Payer: Medicare Other | Source: Ambulatory Visit | Attending: Internal Medicine | Admitting: Internal Medicine

## 2017-01-15 DIAGNOSIS — R1013 Epigastric pain: Secondary | ICD-10-CM

## 2017-03-02 ENCOUNTER — Encounter: Payer: Self-pay | Admitting: Cardiovascular Disease

## 2017-03-02 ENCOUNTER — Ambulatory Visit (INDEPENDENT_AMBULATORY_CARE_PROVIDER_SITE_OTHER): Payer: Medicare Other | Admitting: Cardiovascular Disease

## 2017-03-02 VITALS — BP 174/98 | HR 72 | Ht 65.0 in | Wt 162.6 lb

## 2017-03-02 DIAGNOSIS — Z955 Presence of coronary angioplasty implant and graft: Secondary | ICD-10-CM | POA: Diagnosis not present

## 2017-03-02 DIAGNOSIS — I10 Essential (primary) hypertension: Secondary | ICD-10-CM

## 2017-03-02 DIAGNOSIS — E7800 Pure hypercholesterolemia, unspecified: Secondary | ICD-10-CM

## 2017-03-02 MED ORDER — LOSARTAN POTASSIUM 50 MG PO TABS: 50.0000 mg | ORAL_TABLET | Freq: Every day | ORAL | 1 refills | Status: DC

## 2017-03-02 NOTE — Progress Notes (Signed)
03/02/2017 Caitlyn [name]   March 29, 1946  [id number]  Primary Physician Josetta Huddle, MD Primary Cardiologist: Lorretta Harp MD [name], [place]  HPI:  Caitlyn [name] is a 71 y.o. female mildly overweight Caucasian female who I been taking care of for many years. I last saw her in the office 06/17/16. I did perform coronary angiography on her in 2002 demonstrating normal coronary arteries. She does have a strong family history of heart disease. She apparently fell and broke her hip on the ice several days ago and was admitted to the hospital with an intention to perform ORIF of her right hip by Dr. Lyla Glassing . She has had several episodes of chest pain with mildly elevated troponin. I performed cardiac catheterization on her 06/01/16 revealing a 90% mid LAD lesion which I stented with a Synergy drug-eluting stent with an excellent result. She underwent right ORIF for fractured hip one week later by Dr. Lyla Glassing on dual antiplatelet therapy. She underwent physical therapy and rehabilitation and is currently asymptomatic from this. She denies chest pain or shortness of breath. Her major issue however is of elevated blood pressure.    Current Meds  Medication Sig  . aspirin EC 81 MG EC tablet Take 1 tablet (81 mg total) by mouth daily.  . carvedilol (COREG) 12.5 MG tablet Take 1 tablet (12.5 mg total) by mouth 2 (two) times daily with a meal.  . carvedilol (COREG) 12.5 MG tablet Take 12.5 mg by mouth 2 (two) times daily with a meal.  . citalopram (CELEXA) 20 MG tablet Take 20 mg by mouth daily.  . clopidogrel (PLAVIX) 75 MG tablet Take 1 tablet (75 mg total) by mouth daily with breakfast.  . feeding supplement (BOOST / RESOURCE BREEZE) LIQD Take 1 Container by mouth 2 (two) times daily between meals.  Marland Kitchen levothyroxine (SYNTHROID, LEVOTHROID) 88 MCG tablet Take 88 mcg by mouth daily.  . lipase/protease/amylase (CREON) 36000 UNITS CPEP capsule Take 36,000 Units by mouth daily. May take another  36,000 units if needed  . losartan (COZAAR) 50 MG tablet Take 1 tablet (50 mg total) by mouth daily.  . methocarbamol (ROBAXIN) 500 MG tablet Take 1 tablet (500 mg total) by mouth every 6 (six) hours as needed for muscle spasms.  . nitroGLYCERIN (NITROSTAT) 0.4 MG SL tablet Place 1 tablet (0.4 mg total) under the tongue every 5 (five) minutes as needed for chest pain.  Marland Kitchen omeprazole (PRILOSEC) 40 MG capsule Take 1 capsule (40 mg total) by mouth daily.  . rosuvastatin (CRESTOR) 10 MG tablet Take 10 mg by mouth every other day.   . [DISCONTINUED] losartan (COZAAR) 25 MG tablet Take 1 tablet (25 mg total) by mouth daily.     Allergies  Allergen Reactions  . Cortisone Anaphylaxis    Some forms of cortisone  . Monosodium Glutamate Anaphylaxis  . Prednisone Anaphylaxis    confusion  . Shellfish Allergy Anaphylaxis  . Buprenorphine Hcl Nausea And Vomiting and Hypertension    SHAKING  . Morphine And Related Nausea And Vomiting, Other (See Comments) and Hypertension    SHAKING  . Readi-Cat [Barium Sulfate] Diarrhea    Patient does not want to drink Readi-Cat because it gives her diarrhea  . Demerol [Meperidine]     UNSPECIFIED REACTION   . Sulfa Antibiotics     UNSPECIFIED REACTION   . Xanax [Alprazolam] Other (See Comments)    "makes me crazy"  . Benzoic Acid Nausea Only    Social History  Social History  . Marital status: Widowed    Spouse name: N/A  . Number of children: 1  . Years of education: N/A   Occupational History  . retired    Social History Main Topics  . Smoking status: Never Smoker  . Smokeless tobacco: Never Used  . Alcohol use No  . Drug use: No  . Sexual activity: Not on file   Other Topics Concern  . Not on file   Social History Narrative  . No narrative on file     Review of Systems: General: negative for chills, fever, night sweats or weight changes.  Cardiovascular: negative for chest pain, dyspnea on exertion, edema, orthopnea, palpitations,  paroxysmal nocturnal dyspnea or shortness of breath Dermatological: negative for rash Respiratory: negative for cough or wheezing Urologic: negative for hematuria Abdominal: negative for nausea, vomiting, diarrhea, bright red blood per rectum, melena, or hematemesis Neurologic: negative for visual changes, syncope, or dizziness All other systems reviewed and are otherwise negative except as noted above.    Blood pressure (!) 174/98, pulse 72, height 5\' 5"  (1.651 m), weight 162 lb 9.6 oz (73.8 kg).  General appearance: alert and no distress Neck: no adenopathy, no carotid bruit, no JVD, supple, symmetrical, trachea midline and thyroid not enlarged, symmetric, no tenderness/mass/nodules Lungs: clear to auscultation bilaterally Heart: regular rate and rhythm, S1, S2 normal, no murmur, click, rub or gallop Extremities: extremities normal, atraumatic, no cyanosis or edema Pulses: 2+ and symmetric Skin: Skin color, texture, turgor normal. No rashes or lesions Neurologic: Alert and oriented X 3, normal strength and tone. Normal symmetric reflexes. Normal coordination and gait  EKG sinus rhythm at 72 without ST or T-wave changes. I personally reviewed this EKG.  ASSESSMENT AND PLAN:   Essential hypertension History of essential hypertension blood pressure measured today at 174/98. She is on carvedilol and low-dose losartan . Her PCP doubled her carvedilol resulted in paradoxic increase in her blood pressure. I reviewed her home blood pressure readings which do demonstrate periodic elevations of her blood pressure. I'm going to increase her losartan from 25-50 mg a day. She will keep a blood pressure log and a daily basis and will see Erasmo Downer back in 4 weeks for follow-up.  Hyperlipidemia History of hyperlipidemia on statin therapy followed by her PCP  Status post insertion of drug-eluting stent into left anterior descending (LAD) artery History of CAD status post mid LAD PCI and drug-eluting  stenting by myself 06/01/16 the setting of a non-STEMI with a 2.25 mm x 20 mm long synergy drug-eluting stent postdilated to 2.36 mm. She has not had recurrent symptoms and remains on dual antiplatelet therapy.      Lorretta Harp MD FACP,FACC,FAHA, [facility] 03/02/2017 10:41 AM

## 2017-03-02 NOTE — Assessment & Plan Note (Signed)
History of essential hypertension blood pressure measured today at 174/98. She is on carvedilol and low-dose losartan . Her PCP doubled her carvedilol resulted in paradoxic increase in her blood pressure. I reviewed her home blood pressure readings which do demonstrate periodic elevations of her blood pressure. I'm going to increase her losartan from 25-50 mg a day. She will keep a blood pressure log and a daily basis and will see Caitlyn [name] back in 4 weeks for follow-up.

## 2017-03-02 NOTE — Assessment & Plan Note (Signed)
History of CAD status post mid LAD PCI and drug-eluting stenting by myself 06/01/16 the setting of a non-STEMI with a 2.25 mm x 20 mm long synergy drug-eluting stent postdilated to 2.36 mm. She has not had recurrent symptoms and remains on dual antiplatelet therapy.

## 2017-03-02 NOTE — Assessment & Plan Note (Signed)
History of hyperlipidemia on statin therapy followed by her PCP. 

## 2017-03-02 NOTE — Patient Instructions (Signed)
Your physician has recommended you make the following change in your medication: INCREASE losartan to 50mg  daily  Dr. [name] has requested that you schedule an appointment with one of our clinical pharmacists for a blood pressure check appointment within the next 4 weeks.  -- if you monitor your blood pressure (BP) at home, please bring your BP cuff and your BP readings with you to this appointment -- please check your BP no more than twice daily, after you have been sitting/resting for 5-10 minutes, at least 1 hour after taking your BP medications  Your physician recommends that you schedule a follow-up appointment in: 4 months with Suanne Marker, [place]  Your physician wants you to follow-up in: 6 months with Dr. [name]. You will receive a reminder letter in the mail two months in advance. If you don't receive a letter, please call our office to schedule the follow-up appointment.

## 2017-03-10 ENCOUNTER — Telehealth: Payer: Self-pay | Admitting: Cardiovascular Disease

## 2017-03-10 NOTE — Telephone Encounter (Signed)
Returned call to pt she states that her BP has been in the 200's for more than 2 weeks. Denies chest pain or pressure, dizziness, nausea or vomiting.  BP today 184/115 Hr 78 Yesterday 213/107 83 Sun  204/104  83 Sat   202/96  83 Fri AM  191/106 PM 203/105 HR 80  Mad an appt for tomorrow w/Hao. She will go to the ER if she has any sx from above, verbalizes understanding, she states that she does not think that she need to go now, but she states that she will go if needed.

## 2017-03-10 NOTE — Telephone Encounter (Signed)
Please call,Pt says her blood pressure have been extremely high this week.It have been up to 204/104.

## 2017-03-11 ENCOUNTER — Encounter: Payer: Self-pay | Admitting: Physician Assistant

## 2017-03-11 ENCOUNTER — Ambulatory Visit (INDEPENDENT_AMBULATORY_CARE_PROVIDER_SITE_OTHER): Payer: Medicare Other | Admitting: Physician Assistant

## 2017-03-11 VITALS — BP 160/100 | HR 80 | Ht 65.0 in | Wt 159.8 lb

## 2017-03-11 DIAGNOSIS — I2510 Atherosclerotic heart disease of native coronary artery without angina pectoris: Secondary | ICD-10-CM | POA: Diagnosis not present

## 2017-03-11 DIAGNOSIS — E039 Hypothyroidism, unspecified: Secondary | ICD-10-CM

## 2017-03-11 DIAGNOSIS — I10 Essential (primary) hypertension: Secondary | ICD-10-CM | POA: Diagnosis not present

## 2017-03-11 LAB — BASIC METABOLIC PANEL
BUN / CREAT RATIO: 21 (ref 12–28)
BUN: 15 mg/dL (ref 8–27)
CHLORIDE: 97 mmol/L (ref 96–106)
CO2: 24 mmol/L (ref 20–29)
CREATININE: 0.72 mg/dL (ref 0.57–1.00)
Calcium: 9.2 mg/dL (ref 8.7–10.3)
GFR calc non Af Amer: 83 mL/min/{1.73_m2} (ref >59)
GFR, EST AFRICAN AMERICAN: 96 mL/min/{1.73_m2} (ref >59)
Glucose: 154 mg/dL — ABNORMAL HIGH (ref 65–99)
Potassium: 3.5 mmol/L (ref 3.5–5.2)
SODIUM: 140 mmol/L (ref 134–144)

## 2017-03-11 LAB — TSH: TSH: 1.96 u[IU]/mL (ref 0.450–4.500)

## 2017-03-11 MED ORDER — AMLODIPINE BESYLATE 5 MG PO TABS: 5.0000 mg | ORAL_TABLET | Freq: Every day | ORAL | 3 refills | Status: DC

## 2017-03-11 NOTE — Patient Instructions (Signed)
Medication Instructions:  START Norvasc 5mg  Take 1 tablet once a day  Labwork: Your physician recommends that you return for lab work in: [place]  Testing/Procedures: Your physician has requested that you have a renal artery duplex. During this test, an ultrasound is used to evaluate blood flow to the kidneys. Allow one hour for this exam. Do not eat after midnight the day before and avoid carbonated beverages. Take your medications as you usually do.  Follow-Up: Your physician recommends that you schedule a follow-up appointment in: 3-4 weeks in [place], [place]  Any Other Special Instructions Will Be Listed Below (If Applicable).  If you need a refill on your cardiac medications before your next appointment, please call your pharmacy.

## 2017-03-11 NOTE — Progress Notes (Signed)
Cardiology Office Note    Date:  03/13/2017   ID:  Caitlyn, [name] 04-15-1946, MRN [id number]  PCP:  Josetta Huddle, MD  Cardiologist:  Dr. [name]  Chief Complaint  Patient presents with  . Follow-up    seen for Dr. [name]    History of Present Illness:  Caitlyn [name] is a 71 y.o. female with PMH of CAD, GERD, HTN and R kidney mass. She had a tachycardic cardiac catheterization in 2002 demonstrating normal coronaries. She has very strong family history of CAD. Last cardiac catheterization performed on 06/01/2016 revealed a 90% mid LAD lesion treated with Synergy DES. She underwent right ORIF for fractured hip a week later on dual antiplatelet therapy. She was last seen by Dr. [name] on 03/02/2017, she has been having some elevated blood pressure.  Since earlier this year, she does occasionally have left flank pain, however it is not occurring with exertion. She recently noticed her blood pressure has been elevated, she was seen at her PCPs office and her carvedilol was increased. This did not improve her blood pressure. She was seen by Dr. [name] last week again, her losartan was increased from 25 mg to 50 mg. Her blood pressure remained elevated without significant improvement. She called back and was added on to my schedule. She does admit to some degree of dizziness. She also has been complaining of day and night sweat. I added 5 mg amlodipine to her medication list. I also recommended TSH, basic metabolic panel and renal artery ultrasound to rule out bilateral renal artery stenosis. She says sometimes she feels like she has to run to the bathroom, however when she gets there, she noticed only dripping. Basic metabolic panel will allow [place] to check both her renal function and her electrolyte after losartan was increased.   Past Medical History:  Diagnosis Date  . Anemia   . Arthritis   . Bleeding diathesis ([place]) 02/22/2013  . Closed right hip fracture ([place]) 05/28/2016  . Coronary artery  disease   . E coli enteritis   . GERD (gastroesophageal reflux disease)   . H/O hiatal hernia   . Head injury   . Hypertension   . Iron deficiency anemia, unspecified 02/22/2013  . Mass of right kidney 08/22/2013   2.5 cm solid lesion seen on 5/14 CT  . Radicular pain of thoracic region 08/22/2013   Left T-8 started after pushing heavy chest 1/15; intermittent; no focal neuro deficit    Past Surgical History:  Procedure Laterality Date  . ABDOMINAL HYSTERECTOMY    . BREAST BIOPSY    . BREAST LUMPECTOMY Left   . BREAST REDUCTION SURGERY  1981  . BREAST SURGERY  1981   rt-nodes taken out-non cancer  . BREAST SURGERY  1981   rt lumpectomy  . CARDIAC CATHETERIZATION N/A 06/01/2016   Procedure: LEFT HEART CATH;  Surgeon: Lorretta Harp, MD;  Location: [facility];  Service: Cardiovascular;  Laterality: N/A;  . CARDIAC CATHETERIZATION N/A 06/01/2016   Procedure: Coronary Stent Intervention;  Surgeon: Lorretta Harp, MD;  Location: [facility];  Service: Cardiovascular;  Laterality: N/A;  . COLONOSCOPY    . FACIAL FRACTURE SURGERY  2000  . FEMUR IM NAIL Right 06/03/2016   Procedure: INTRAMEDULLARY (IM) NAIL FEMORAL;  Surgeon: Rod Can, MD;  Location: [facility];  Service: Orthopedics;  Laterality: Right;  . TONSILLECTOMY    . TRIGGER FINGER RELEASE Right 11/28/2013   Procedure: RELEASE TRIGGER FINGER/A-1 PULLEY RIGHT LONG  FINGER;  Surgeon: Cammie Sickle, MD;  Location: [facility];  Service: Orthopedics;  Laterality: Right;  . URETHRA SURGERY     age 84    Current Medications: Outpatient Medications Prior to Visit  Medication Sig Dispense Refill  . aspirin EC 81 MG EC tablet Take 1 tablet (81 mg total) by mouth daily. 30 tablet 1  . carvedilol (COREG) 12.5 MG tablet Take 1 tablet (12.5 mg total) by mouth 2 (two) times daily with a meal. 60 tablet 3  . carvedilol (COREG) 12.5 MG tablet Take 12.5 mg by mouth 2 (two) times daily with a meal.    .  citalopram (CELEXA) 20 MG tablet Take 20 mg by mouth daily.    . clopidogrel (PLAVIX) 75 MG tablet Take 1 tablet (75 mg total) by mouth daily with breakfast. 30 tablet 2  . feeding supplement (BOOST / RESOURCE BREEZE) LIQD Take 1 Container by mouth 2 (two) times daily between meals.  0  . levothyroxine (SYNTHROID, LEVOTHROID) 88 MCG tablet Take 88 mcg by mouth daily.    . lipase/protease/amylase (CREON) 36000 UNITS CPEP capsule Take 36,000 Units by mouth daily. May take another 36,000 units if needed    . losartan (COZAAR) 50 MG tablet Take 1 tablet (50 mg total) by mouth daily. 90 tablet 1  . methocarbamol (ROBAXIN) 500 MG tablet Take 1 tablet (500 mg total) by mouth every 6 (six) hours as needed for muscle spasms. 30 tablet 3  . nitroGLYCERIN (NITROSTAT) 0.4 MG SL tablet Place 1 tablet (0.4 mg total) under the tongue every 5 (five) minutes as needed for chest pain. 25 tablet 1  . omeprazole (PRILOSEC) 40 MG capsule Take 1 capsule (40 mg total) by mouth daily. 30 capsule 0  . rosuvastatin (CRESTOR) 10 MG tablet Take 10 mg by mouth every other day.      Facility-Administered Medications Prior to Visit  Medication Dose Route Frequency Provider Last Rate Last Dose  . fentaNYL (SUBLIMAZE) injection    Anesthesia Intra-op Marinda Elk A, CRNA   50 mcg at 05/31/16 0721  . lactated ringers infusion    Continuous PRN Marinda Elk A, CRNA 50 mL/hr at 06/03/16 2300       Allergies:   Cortisone; Monosodium glutamate; Prednisone; Shellfish allergy; Buprenorphine hcl; Morphine and related; Readi-cat [barium sulfate]; Demerol [meperidine]; Sulfa antibiotics; Xanax [alprazolam]; and Benzoic acid   Social History   Social History  . Marital status: Widowed    Spouse name: N/A  . Number of children: 1  . Years of education: N/A   Occupational History  . retired    Social History Main Topics  . Smoking status: Never Smoker  . Smokeless tobacco: Never Used  . Alcohol use No  . Drug use: No  .  Sexual activity: Not Asked   Other Topics Concern  . None   Social History Narrative  . None     Family History:  The patient's family history includes Alzheimer's disease in her mother; Colon cancer in her maternal grandfather; Heart attack in her father; Heart disease in her brother and mother.   ROS:   Please see the history of present illness.    ROS All other systems reviewed and are negative.   PHYSICAL EXAM:   VS:  BP (!) 160/100 (BP Location: Right Arm, Patient Position: Sitting, Cuff Size: Normal) Comment: pt c/o headaches and dizziness  Pulse 80   Ht 5\' 5"  (1.651 m)   Wt 159 lb  12.8 oz (72.5 kg)   BMI 26.59 kg/m    GEN: Well nourished, well developed, in no acute distress  HEENT: normal  Neck: no JVD, carotid bruits, or masses Cardiac: RRR; no murmurs, rubs, or gallops,no edema  Respiratory:  clear to auscultation bilaterally, normal work of breathing GI: soft, nontender, nondistended, + BS MS: no deformity or atrophy  Skin: warm and dry, no rash Neuro:  Alert and Oriented x 3, Strength and sensation are intact Psych: euthymic mood, full affect  Wt Readings from Last 3 Encounters:  03/11/17 159 lb 12.8 oz (72.5 kg)  03/02/17 162 lb 9.6 oz (73.8 kg)  01/08/17 163 lb 3.2 oz (74 kg)      Studies/Labs Reviewed:   EKG:  EKG is not ordered today.   Recent Labs: 06/05/2016: Magnesium 2.3 06/30/2016: ALT 8 12/23/2016: Hemoglobin 12.9; Platelets 236 03/11/2017: BUN 15; Creatinine, Ser 0.72; Potassium 3.5; Sodium 140; TSH 1.960   Lipid Panel No results found for: CHOL, TRIG, HDL, CHOLHDL, VLDL, LDLCALC, LDLDIRECT  Additional studies/ records that were reviewed today include:   Cath 06/01/2016 Conclusion     Prox RCA lesion, 20 %stenosed.  Mid LAD lesion, 90 %stenosed.  Post intervention, there is a 0% residual stenosis.  A stent was successfully placed.     Echo 05/30/2016 LV EF: 60% -   65%  Study Conclusions  - Left ventricle: The cavity size  was normal. Systolic function was   normal. The estimated ejection fraction was in the range of 60%   to 65%. Wall motion was normal; there were no regional wall   motion abnormalities. Doppler parameters are consistent with   abnormal left ventricular relaxation (grade 1 diastolic   dysfunction). - Pulmonary arteries: Systolic pressure was mildly increased. PA   peak pressure: 40 mm Hg (S).   ASSESSMENT:    1. Hypertension, malignant   2. Coronary artery disease involving native coronary artery of native heart without angina pectoris   3. Hypothyroidism, unspecified type      PLAN:  In order of problems listed above:  1. Uncontrolled hypertension: Blood pressure has been uncontrolled recently. According to the patient, his carvedilol was increased recently by primary care provider, however pressure remained uncontrolled. Dr. [name] increased her losartan from 25 mg to 50 mg. This did not result significant change either. I will add amlodipine 5 mg daily today. I will also obtain outpatient metabolic panel, TSH and renal artery ultrasound as well. May also consider 24 hour metanephrine later if her blood pressure remain elevated and resistent to BP med titration.   2. CAD: Denies any obvious angina. Underwent PCI of LAD in January 2018. Continue aspirin, carvedilol, Plavix, and Crestor  3. Hypothyroidism: Managed by primary care provider    Medication Adjustments/Labs and Tests Ordered: Current medicines are reviewed at length with the patient today.  Concerns regarding medicines are outlined above.  Medication changes, Labs and Tests ordered today are listed in the Patient Instructions below. Patient Instructions  Medication Instructions:  START Norvasc 5mg  Take 1 tablet once a day  Labwork: Your physician recommends that you return for lab work in: [place]  Testing/Procedures: Your physician has requested that you have a renal artery duplex. During this test, an  ultrasound is used to evaluate blood flow to the kidneys. Allow one hour for this exam. Do not eat after midnight the day before and avoid carbonated beverages. Take your medications as you usually do.  Follow-Up: Your physician recommends that you  schedule a follow-up appointment in: 3-4 weeks in [place], [place]  Any Other Special Instructions Will Be Listed Below (If Applicable).  If you need a refill on your cardiac medications before your next appointment, please call your pharmacy.     Hilbert Corrigan, [place]  03/13/2017 8:38 AM    [address], [place], [place]  [address] Phone: [phone]; Fax: [phone]

## 2017-03-13 ENCOUNTER — Encounter: Payer: Self-pay | Admitting: Physician Assistant

## 2017-03-17 ENCOUNTER — Ambulatory Visit (HOSPITAL_COMMUNITY)
Admission: RE | Admit: 2017-03-17 | Discharge: 2017-03-17 | Disposition: A | Discharge status: Home / Self Care | Payer: Medicare Other | Source: Ambulatory Visit | Attending: Cardiology | Admitting: Cardiology

## 2017-03-17 ENCOUNTER — Other Ambulatory Visit: Payer: Medicare Other

## 2017-03-17 DIAGNOSIS — I10 Essential (primary) hypertension: Secondary | ICD-10-CM | POA: Insufficient documentation

## 2017-03-18 NOTE — Progress Notes (Signed)
Right renal artery stable, left renal artery has < 60% blockage, unlikely to cut off significant amount of blood flow to cause uncontrolled hypertension

## 2017-03-23 ENCOUNTER — Other Ambulatory Visit: Payer: Medicare Other

## 2017-03-25 ENCOUNTER — Ambulatory Visit: Payer: Medicare Other | Admitting: Physician Assistant

## 2017-03-25 NOTE — Progress Notes (Deleted)
Cardiology Office Note    Date:  03/25/2017   ID:  Caitlyn [name] [birth year], MRN [id number]  PCP:  Caitlyn Huddle, MD  Cardiologist:  Dr. Gwenlyn [name]  No chief complaint on file.   History of Present Illness:  Caitlyn [name] is a 71 y.o. female with PMH of CAD, GERD, HTN and R kidney mass. She had a tachycardic cardiac catheterization in 2002 demonstrating normal coronaries. She has very strong family history of CAD. Last cardiac catheterization performed on 06/01/2016 revealed a 90% mid LAD lesion treated with Synergy DES. She underwent right ORIF for fractured hip a week later on dual antiplatelet therapy. She was last seen by Dr. Gwenlyn [name] on 03/02/2017, she has been having some elevated blood pressure.  She recently noticed her blood pressure has been elevated, she was seen at her PCPs office and her carvedilol was increased. This did not improve her blood pressure. She was seen by Dr. Gwenlyn [name] again, her losartan was increased from 25 mg to 50 mg. Her blood pressure remained elevated without significant improvement. I atrophy to 5 mg daily of amlodipine on 03/11/2017. Renal Doppler was negative for renal artery stenosis. She presents today for follow-up.  No EKG  Past Medical History:  Diagnosis Date  . Anemia   . Arthritis   . Bleeding diathesis ([place]) 02/22/2013  . Closed right hip fracture ([place]) 05/28/2016  . Coronary artery disease   . E coli enteritis   . GERD (gastroesophageal reflux disease)   . H/O hiatal hernia   . Head injury   . Hypertension   . Iron deficiency anemia, unspecified 02/22/2013  . Mass of right kidney 08/22/2013   2.5 cm solid lesion seen on 5/14 CT  . Radicular pain of thoracic region 08/22/2013   Left T-8 started after pushing heavy chest 1/15; intermittent; no focal neuro deficit    Past Surgical History:  Procedure Laterality Date  . ABDOMINAL HYSTERECTOMY    . BREAST BIOPSY    . BREAST LUMPECTOMY Left   . BREAST REDUCTION SURGERY  1981  . BREAST  SURGERY  1981   rt-nodes taken out-non cancer  . BREAST SURGERY  1981   rt lumpectomy  . CARDIAC CATHETERIZATION N/A 06/01/2016   Procedure: LEFT HEART CATH;  Surgeon: Lorretta Harp, MD;  Location: [facility];  Service: Cardiovascular;  Laterality: N/A;  . CARDIAC CATHETERIZATION N/A 06/01/2016   Procedure: Coronary Stent Intervention;  Surgeon: Lorretta Harp, MD;  Location: [facility];  Service: Cardiovascular;  Laterality: N/A;  . COLONOSCOPY    . FACIAL FRACTURE SURGERY  2000  . FEMUR IM NAIL Right 06/03/2016   Procedure: INTRAMEDULLARY (IM) NAIL FEMORAL;  Surgeon: Rod Can, MD;  Location: [facility];  Service: Orthopedics;  Laterality: Right;  . TONSILLECTOMY    . TRIGGER FINGER RELEASE Right 11/28/2013   Procedure: RELEASE TRIGGER FINGER/A-1 PULLEY RIGHT LONG FINGER;  Surgeon: Cammie Sickle, MD;  Location: [facility];  Service: Orthopedics;  Laterality: Right;  . URETHRA SURGERY     age 68    Current Medications: Outpatient Medications Prior to Visit  Medication Sig Dispense Refill  . amLODipine (NORVASC) 5 MG tablet Take 1 tablet (5 mg total) by mouth daily. 30 tablet 3  . aspirin EC 81 MG EC tablet Take 1 tablet (81 mg total) by mouth daily. 30 tablet 1  . carvedilol (COREG) 12.5 MG tablet Take 1 tablet (12.5 mg total) by mouth 2 (two) times daily  with a meal. 60 tablet 3  . carvedilol (COREG) 12.5 MG tablet Take 12.5 mg by mouth 2 (two) times daily with a meal.    . citalopram (CELEXA) 20 MG tablet Take 20 mg by mouth daily.    . clopidogrel (PLAVIX) 75 MG tablet Take 1 tablet (75 mg total) by mouth daily with breakfast. 30 tablet 2  . feeding supplement (BOOST / RESOURCE BREEZE) LIQD Take 1 Container by mouth 2 (two) times daily between meals.  0  . levothyroxine (SYNTHROID, LEVOTHROID) 88 MCG tablet Take 88 mcg by mouth daily.    . lipase/protease/amylase (CREON) 36000 UNITS CPEP capsule Take 36,000 Units by mouth daily. May take another  36,000 units if needed    . losartan (COZAAR) 50 MG tablet Take 1 tablet (50 mg total) by mouth daily. 90 tablet 1  . methocarbamol (ROBAXIN) 500 MG tablet Take 1 tablet (500 mg total) by mouth every 6 (six) hours as needed for muscle spasms. 30 tablet 3  . nitroGLYCERIN (NITROSTAT) 0.4 MG SL tablet Place 1 tablet (0.4 mg total) under the tongue every 5 (five) minutes as needed for chest pain. 25 tablet 1  . omeprazole (PRILOSEC) 40 MG capsule Take 1 capsule (40 mg total) by mouth daily. 30 capsule 0  . rosuvastatin (CRESTOR) 10 MG tablet Take 10 mg by mouth every other day.     . venlafaxine XR (EFFEXOR-XR) 150 MG 24 hr capsule Take 1 capsule by mouth daily.     Facility-Administered Medications Prior to Visit  Medication Dose Route Frequency Provider Last Rate Last Dose  . fentaNYL (SUBLIMAZE) injection    Anesthesia Intra-op Marinda Elk A, CRNA   50 mcg at 05/31/16 0721  . lactated ringers infusion    Continuous PRN Marinda Elk A, CRNA 50 mL/hr at 06/03/16 2300       Allergies:   Cortisone; Monosodium glutamate; Prednisone; Shellfish allergy; Buprenorphine hcl; Morphine and related; Readi-cat [barium sulfate]; Demerol [meperidine]; Sulfa antibiotics; Xanax [alprazolam]; and Benzoic acid   Social History   Socioeconomic History  . Marital status: Widowed    Spouse name: Not on file  . Number of children: 1  . Years of education: Not on file  . Highest education level: Not on file  Social Needs  . Financial resource strain: Not on file  . Food insecurity - worry: Not on file  . Food insecurity - inability: Not on file  . Transportation needs - medical: Not on file  . Transportation needs - non-medical: Not on file  Occupational History  . Occupation: retired  Tobacco Use  . Smoking status: Never Smoker  . Smokeless tobacco: Never Used  Substance and Sexual Activity  . Alcohol use: No    Alcohol/week: 0.0 oz  . Drug use: No  . Sexual activity: Not on file  Other Topics  Concern  . Not on file  Social History Narrative  . Not on file     Family History:  The patient's ***family history includes Alzheimer's disease in her mother; Colon cancer in her maternal grandfather; Heart attack in her father; Heart disease in her brother and mother.   ROS:   Please see the history of present illness.    ROS All other systems reviewed and are negative.   PHYSICAL EXAM:   VS:  There were no vitals taken for this visit.   GEN: Well nourished, well developed, in no acute distress  HEENT: normal  Neck: no JVD, carotid bruits, or masses Cardiac: ***  RRR; no murmurs, rubs, or gallops,no edema  Respiratory:  clear to auscultation bilaterally, normal work of breathing GI: soft, nontender, nondistended, + BS MS: no deformity or atrophy  Skin: warm and dry, no rash Neuro:  Alert and Oriented x 3, Strength and sensation are intact Psych: euthymic mood, full affect  Wt Readings from Last 3 Encounters:  03/11/17 159 lb 12.8 oz (72.5 kg)  03/02/17 162 lb 9.6 oz (73.8 kg)  01/08/17 163 lb 3.2 oz (74 kg)      Studies/Labs Reviewed:   EKG:  EKG is*** ordered today.  The ekg ordered today demonstrates ***  Recent Labs: 06/05/2016: Magnesium 2.3 06/30/2016: ALT 8 12/23/2016: Hemoglobin 12.9; Platelets 236 03/11/2017: BUN 15; Creatinine, Ser 0.72; Potassium 3.5; Sodium 140; TSH 1.960   Lipid Panel No results [name] for: CHOL, TRIG, HDL, CHOLHDL, VLDL, LDLCALC, LDLDIRECT  Additional studies/ records that were reviewed today include:   Renal doppler 03/17/2017 FINAL INTERPRETATION  Largest Aortic Diameter  2.3 cm  Renal Normal size right renal artery. Normal cortical thickness of right kidney. No evidence of right renal artery stenosis. RRV flow present. Normal size of left renal artery. Normal cortical thickness of the left kidney. Less than 60% stenosis of the left renal artery. LRV flow present.  Mesenteric Normal Celiac artery and Superior Mesenteric  artery findings.    ASSESSMENT:    No diagnosis [name].   PLAN:  In order of problems listed above:  1. ***    Medication Adjustments/Labs and Tests Ordered: Current medicines are reviewed at length with the patient today.  Concerns regarding medicines are outlined above.  Medication changes, Labs and Tests ordered today are listed in the Patient Instructions below. There are no Patient Instructions on file for this visit.   Hilbert Corrigan, [place]  03/25/2017 1:18 PM    [facility] [place], [place], [place]  [address] Phone: [phone]; Fax: [phone]

## 2017-03-26 ENCOUNTER — Encounter: Payer: Self-pay | Admitting: *Deleted

## 2017-03-30 ENCOUNTER — Ambulatory Visit: Payer: Medicare Other

## 2017-04-05 ENCOUNTER — Ambulatory Visit: Payer: Medicare Other | Admitting: Oncology

## 2017-04-07 DIAGNOSIS — I10 Essential (primary) hypertension: Secondary | ICD-10-CM | POA: Diagnosis not present

## 2017-04-07 DIAGNOSIS — D509 Iron deficiency anemia, unspecified: Secondary | ICD-10-CM | POA: Diagnosis not present

## 2017-04-07 DIAGNOSIS — F329 Major depressive disorder, single episode, unspecified: Secondary | ICD-10-CM | POA: Diagnosis not present

## 2017-04-07 DIAGNOSIS — E039 Hypothyroidism, unspecified: Secondary | ICD-10-CM | POA: Diagnosis not present

## 2017-04-07 DIAGNOSIS — D649 Anemia, unspecified: Secondary | ICD-10-CM | POA: Diagnosis not present

## 2017-04-07 DIAGNOSIS — J4520 Mild intermittent asthma, uncomplicated: Secondary | ICD-10-CM | POA: Diagnosis not present

## 2017-04-07 DIAGNOSIS — E785 Hyperlipidemia, unspecified: Secondary | ICD-10-CM | POA: Diagnosis not present

## 2017-04-07 DIAGNOSIS — I214 Non-ST elevation (NSTEMI) myocardial infarction: Secondary | ICD-10-CM | POA: Diagnosis not present

## 2017-04-13 ENCOUNTER — Encounter: Payer: Self-pay | Admitting: Pharmacist

## 2017-04-13 ENCOUNTER — Ambulatory Visit (INDEPENDENT_AMBULATORY_CARE_PROVIDER_SITE_OTHER): Payer: Medicare Other | Admitting: Pharmacist

## 2017-04-13 VITALS — BP 148/86 | HR 70

## 2017-04-13 DIAGNOSIS — I10 Essential (primary) hypertension: Secondary | ICD-10-CM

## 2017-04-13 MED ORDER — CLOPIDOGREL BISULFATE 75 MG PO TABS: 75.0000 mg | ORAL_TABLET | Freq: Every day | ORAL | 2 refills | Status: DC

## 2017-04-13 MED ORDER — ROSUVASTATIN CALCIUM 20 MG PO TABS: 10.0000 mg | ORAL_TABLET | ORAL | 2 refills | Status: DC

## 2017-04-13 NOTE — Assessment & Plan Note (Addendum)
Blood pressure remains above goal in office and at home. Patient provided only 13 readings for last 30 days home BP monitoring. Highest readings are all in the morning prior to medication administration.  Noted elevated amount of sodium in diet eating multiple "lean cuisine" meals every week. Will change mediation administration to carvedilol twice daily, losartan every morning and amlodipine every evening to target lower BP reading in the mornings. Patient to work on decreasing sodium intake and increasing physical activity as much as possible. She is to monitor BP twice daily and bring records to next f/u appointment in 5 weeks. Plan to increase losartan to 50 mg twice daily if additional BP control needed during next f/u visit.

## 2017-04-13 NOTE — Patient Instructions (Addendum)
Return for a  follow up appointment in 4 weeks  Your blood pressure today is 148/86 pulse 70  Check your blood pressure at home daily (if able) and keep record of the readings.  Morning blood pressure medicaiton: Carvedilol 25mg   Losartan 50mg    Evening blood pressure medication: Carvedilol 25mg  Amlodipine 5mg   Bring your record of home blood pressures to your next appointment.  Exercise as you're able, try to walk approximately 30 minutes per day.  Keep salt intake to a minimum, especially watch canned and prepared boxed foods.  Eat more fresh fruits and vegetables and fewer canned items.  Avoid eating in fast food restaurants.    HOW TO TAKE YOUR BLOOD PRESSURE: . Rest 5 minutes before taking your blood pressure. .  Don't smoke or drink caffeinated beverages for at least 30 minutes before. . Take your blood pressure before (not after) you eat. . Sit comfortably with your back supported and both feet on the floor (don't cross your legs). . Elevate your arm to heart level on a table or a desk. . Use the proper sized cuff. It should fit smoothly and snugly around your bare upper arm. There should be enough room to slip a fingertip under the cuff. The bottom edge of the cuff should be 1 inch above the crease of the elbow. . Ideally, take 3 measurements at one sitting and record the average.     DASH Eating Plan DASH stands for "Dietary Approaches to Stop Hypertension." The DASH eating plan is a healthy eating plan that has been shown to reduce high blood pressure (hypertension). It may also reduce your risk for type 2 diabetes, heart disease, and stroke. The DASH eating plan may also help with weight loss. What are tips for following this plan? General guidelines  Avoid eating more than 2,300 mg (milligrams) of salt (sodium) a day. If you have hypertension, you may need to reduce your sodium intake to 1,500 mg a day.  Limit alcohol intake to no more than 1 drink a day for nonpregnant  women and 2 drinks a day for men. One drink equals 12 oz of beer, 5 oz of wine, or 1 oz of hard liquor.  Work with your health care provider to maintain a healthy body weight or to lose weight. Ask what an ideal weight is for you.  Get at least 30 minutes of exercise that causes your heart to beat faster (aerobic exercise) most days of the week. Activities may include walking, swimming, or biking.  Work with your health care provider or diet and nutrition specialist (dietitian) to adjust your eating plan to your individual calorie needs. Reading food labels  Check food labels for the amount of sodium per serving. Choose foods with less than 5 percent of the Daily Value of sodium. Generally, foods with less than 300 mg of sodium per serving fit into this eating plan.  To find whole grains, look for the word "whole" as the first word in the ingredient list. Shopping  Buy products labeled as "low-sodium" or "no salt added."  Buy fresh foods. Avoid canned foods and premade or frozen meals. Cooking  Avoid adding salt when cooking. Use salt-free seasonings or herbs instead of table salt or sea salt. Check with your health care provider or pharmacist before using salt substitutes.  Do not fry foods. Cook foods using healthy methods such as baking, boiling, grilling, and broiling instead.  Cook with heart-healthy oils, such as olive, canola, soybean, or sunflower  oil. Meal planning   Eat a balanced diet that includes: ? 5 or more servings of fruits and vegetables each day. At each meal, try to fill half of your plate with fruits and vegetables. ? Up to 6-8 servings of whole grains each day. ? Less than 6 oz of lean meat, poultry, or fish each day. A 3-oz serving of meat is about the same size as a deck of cards. One egg equals 1 oz. ? 2 servings of low-fat dairy each day. ? A serving of nuts, seeds, or beans 5 times each week. ? Heart-healthy fats. Healthy fats called Omega-3 fatty acids  are found in foods such as flaxseeds and coldwater fish, like sardines, salmon, and mackerel.  Limit how much you eat of the following: ? Canned or prepackaged foods. ? Food that is high in trans fat, such as fried foods. ? Food that is high in saturated fat, such as fatty meat. ? Sweets, desserts, sugary drinks, and other foods with added sugar. ? Full-fat dairy products.  Do not salt foods before eating.  Try to eat at least 2 vegetarian meals each week.  Eat more home-cooked food and less restaurant, buffet, and fast food.  When eating at a restaurant, ask that your food be prepared with less salt or no salt, if possible. What foods are recommended? The items listed may not be a complete list. Talk with your dietitian about what dietary choices are best for you. Grains Whole-grain or whole-wheat bread. Whole-grain or whole-wheat pasta. Brown rice. [place]. Bulgur. Whole-grain and low-sodium cereals. Pita bread. Low-fat, low-sodium crackers. Whole-wheat flour tortillas. Vegetables Fresh or frozen vegetables (raw, steamed, roasted, or grilled). Low-sodium or reduced-sodium tomato and vegetable juice. Low-sodium or reduced-sodium tomato sauce and tomato paste. Low-sodium or reduced-sodium canned vegetables. Fruits All fresh, dried, or frozen fruit. Canned fruit in natural juice (without added sugar). Meat and other protein foods Skinless chicken or [place]. Ground chicken or [place]. Pork with fat trimmed off. Fish and seafood. Egg whites. Dried beans, peas, or lentils. Unsalted nuts, nut butters, and seeds. Unsalted canned beans. Lean cuts of beef with fat trimmed off. Low-sodium, lean deli meat. Dairy Low-fat (1%) or fat-free (skim) milk. Fat-free, low-fat, or reduced-fat cheeses. Nonfat, low-sodium ricotta or cottage cheese. Low-fat or nonfat yogurt. Low-fat, low-sodium cheese. Fats and oils Soft margarine without trans fats. Vegetable oil. Low-fat, reduced-fat, or light  mayonnaise and salad dressings (reduced-sodium). Canola, safflower, olive, soybean, and sunflower oils. Avocado. Seasoning and other foods Herbs. Spices. Seasoning mixes without salt. Unsalted popcorn and pretzels. Fat-free sweets. What foods are not recommended? The items listed may not be a complete list. Talk with your dietitian about what dietary choices are best for you. Grains Baked goods made with fat, such as croissants, muffins, or some breads. Dry pasta or rice meal packs. Vegetables Creamed or fried vegetables. Vegetables in a cheese sauce. Regular canned vegetables (not low-sodium or reduced-sodium). Regular canned tomato sauce and paste (not low-sodium or reduced-sodium). Regular tomato and vegetable juice (not low-sodium or reduced-sodium). Angie Fava. Olives. Fruits Canned fruit in a light or heavy syrup. Fried fruit. Fruit in cream or butter sauce. Meat and other protein foods Fatty cuts of meat. Ribs. Fried meat. Berniece Salines. Sausage. Bologna and other processed lunch meats. Salami. Fatback. Hotdogs. Bratwurst. Salted nuts and seeds. Canned beans with added salt. Canned or smoked fish. Whole eggs or egg yolks. Chicken or [place] with skin. Dairy Whole or 2% milk, cream, and half-and-half. Whole or full-fat cream  cheese. Whole-fat or sweetened yogurt. Full-fat cheese. Nondairy creamers. Whipped toppings. Processed cheese and cheese spreads. Fats and oils Butter. Stick margarine. Lard. Shortening. Ghee. Bacon fat. Tropical oils, such as coconut, palm kernel, or palm oil. Seasoning and other foods Salted popcorn and pretzels. Onion salt, garlic salt, seasoned salt, table salt, and sea salt. Worcestershire sauce. Tartar sauce. Barbecue sauce. Teriyaki sauce. Soy sauce, including reduced-sodium. Steak sauce. Canned and packaged gravies. Fish sauce. Oyster sauce. Cocktail sauce. Horseradish that you find on the shelf. Ketchup. Mustard. Meat flavorings and tenderizers. Bouillon cubes. Hot sauce and  Tabasco sauce. Premade or packaged marinades. Premade or packaged taco seasonings. Relishes. Regular salad dressings. Where to find more information:  National Heart, Lung, and [place]: https://wilson-eaton.com/  American Heart Association: www.heart.org Summary  The DASH eating plan is a healthy eating plan that has been shown to reduce high blood pressure (hypertension). It may also reduce your risk for type 2 diabetes, heart disease, and stroke.  With the DASH eating plan, you should limit salt (sodium) intake to 2,300 mg a day. If you have hypertension, you may need to reduce your sodium intake to 1,500 mg a day.  When on the DASH eating plan, aim to eat more fresh fruits and vegetables, whole grains, lean proteins, low-fat dairy, and heart-healthy fats.  Work with your health care provider or diet and nutrition specialist (dietitian) to adjust your eating plan to your individual calorie needs. This information is not intended to replace advice given to you by your health care provider. Make sure you discuss any questions you have with your health care provider. Document Released: 04/16/2011 Document Revised: 04/20/2016 Document Reviewed: 04/20/2016 Elsevier Interactive Patient Education  2017 [employer].

## 2017-04-13 NOTE — Progress Notes (Signed)
Patient ID: Caitlyn [name]                 DOB: 05-Dec-1945                      MRN: [id number]     HPI: Caitlyn [name] is a 71 y.o. female referred by Dr. [name] to HTN clinic.  PMH includes CAD, hypertension, and R kidney mass. PCP tried to adjust BP medication few weeks ago but patient is not comfortable with anyone but cardiologist office adjusting her "heart" medication.  Patient experienced some lower extremities edema after initiating amlodipine 5mg  but reports complete resolution of symptoms few weeks into therapy. She denies dizziness, headaches, chest pain or increased fatigue.  Current HTN meds:  Carvedilol 25mg  twice daily Losartan 50mg  daily Amlodipine 5mg  daily  BP goal: 130/80 (140/90 due to age and dizziness)  Family History: includes Alzheimer's disease in her mother; Colon cancer in her maternal grandfather; Heart attack in her father; Heart disease in her brother and mother.   Social History: denies tobacco and alcohol use  Diet: significant amount of ready to eat "lean cuisine" meal every week  Exercise: minimal physical activity after hip injury and surgery last year  Home BP readings: 13 readings; average 159/91 (pulse 72-83 bpm)  Wt Readings from Last 3 Encounters:  03/11/17 159 lb 12.8 oz (72.5 kg)  03/02/17 162 lb 9.6 oz (73.8 kg)  01/08/17 163 lb 3.2 oz (74 kg)   BP Readings from Last 3 Encounters:  04/13/17 (!) 148/86  03/11/17 (!) 160/100  03/02/17 (!) 174/98   Pulse Readings from Last 3 Encounters:  04/13/17 70  03/11/17 80  03/02/17 72    Past Medical History:  Diagnosis Date  . Anemia   . Arthritis   . Bleeding diathesis ([place]) 02/22/2013  . Closed right hip fracture ([place]) 05/28/2016  . Coronary artery disease   . E coli enteritis   . GERD (gastroesophageal reflux disease)   . H/O hiatal hernia   . Head injury   . Hypertension   . Iron deficiency anemia, unspecified 02/22/2013  . Mass of right kidney 08/22/2013   2.5 cm solid lesion seen on  5/14 CT  . Radicular pain of thoracic region 08/22/2013   Left T-8 started after pushing heavy chest 1/15; intermittent; no focal neuro deficit    Current Outpatient Medications on File Prior to Visit  Medication Sig Dispense Refill  . amLODipine (NORVASC) 5 MG tablet Take 1 tablet (5 mg total) by mouth daily. 30 tablet 3  . aspirin EC 81 MG EC tablet Take 1 tablet (81 mg total) by mouth daily. 30 tablet 1  . carvedilol (COREG) 25 MG tablet Take 25 mg by mouth 2 (two) times daily with a meal.    . levothyroxine (SYNTHROID, LEVOTHROID) 88 MCG tablet Take 88 mcg by mouth daily.    . lipase/protease/amylase (CREON) 36000 UNITS CPEP capsule Take 36,000 Units by mouth daily. May take another 36,000 units if needed    . losartan (COZAAR) 50 MG tablet Take 1 tablet (50 mg total) by mouth daily. 90 tablet 1  . methocarbamol (ROBAXIN) 500 MG tablet Take 1 tablet (500 mg total) by mouth every 6 (six) hours as needed for muscle spasms. 30 tablet 3  . venlafaxine XR (EFFEXOR-XR) 150 MG 24 hr capsule Take 1 capsule by mouth daily.    . feeding supplement (BOOST / RESOURCE BREEZE) LIQD Take 1 Container by mouth 2 (two) times  daily between meals. (Patient not taking: Reported on 04/13/2017)  0  . nitroGLYCERIN (NITROSTAT) 0.4 MG SL tablet Place 1 tablet (0.4 mg total) under the tongue every 5 (five) minutes as needed for chest pain. 25 tablet 1   Current Facility-Administered Medications on File Prior to Visit  Medication Dose Route Frequency Provider Last Rate Last Dose  . fentaNYL (SUBLIMAZE) injection    Anesthesia Intra-op Marinda Elk A, CRNA   50 mcg at 05/31/16 0721  . lactated ringers infusion    Continuous PRN Marinda Elk A, CRNA 50 mL/hr at 06/03/16 2300      Allergies  Allergen Reactions  . Cortisone Anaphylaxis    Some forms of cortisone  . Monosodium Glutamate Anaphylaxis  . Prednisone Anaphylaxis    confusion  . Shellfish Allergy Anaphylaxis  . Buprenorphine Hcl Nausea And Vomiting  and Hypertension    SHAKING  . Morphine And Related Nausea And Vomiting, Other (See Comments) and Hypertension    SHAKING  . Readi-Cat [Barium Sulfate] Diarrhea    Patient does not want to drink Readi-Cat because it gives her diarrhea  . Demerol [Meperidine]     UNSPECIFIED REACTION   . Sulfa Antibiotics     UNSPECIFIED REACTION   . Xanax [Alprazolam] Other (See Comments)    "makes me crazy"  . Benzoic Acid Nausea Only    Blood pressure (!) 148/86, pulse 70, SpO2 95 %.  Essential hypertension Blood pressure remains above goal in office and at home. Patient provided only 13 readings for last 30 days home BP monitoring. Highest readings are all in the morning prior to medication administration.  Noted elevated amount of sodium in diet eating multiple "lean cuisine" meals every week. Will change mediation administration to carvedilol twice daily, losartan every morning and amlodipine every evening to target lower BP reading in the mornings. Patient to work on decreasing sodium intake and increasing physical activity as much as possible. She is to monitor BP twice daily and bring records to next f/u appointment in 5 weeks. Plan to increase losartan to 50 mg twice daily if additional BP control needed during next f/u visit.  Kaaliyah Kita Rodriguez-Guzman PharmD, BCPS, [place] [address] [place] [address] 04/13/2017 9:16 PM

## 2017-04-14 ENCOUNTER — Encounter (HOSPITAL_COMMUNITY): Payer: Medicare Other

## 2017-04-19 ENCOUNTER — Ambulatory Visit: Payer: Medicare Other | Admitting: Physician Assistant

## 2017-05-03 DIAGNOSIS — Z8719 Personal history of other diseases of the digestive system: Secondary | ICD-10-CM | POA: Insufficient documentation

## 2017-05-03 DIAGNOSIS — S0990XA Unspecified injury of head, initial encounter: Secondary | ICD-10-CM | POA: Insufficient documentation

## 2017-05-03 DIAGNOSIS — K219 Gastro-esophageal reflux disease without esophagitis: Secondary | ICD-10-CM | POA: Insufficient documentation

## 2017-05-03 DIAGNOSIS — I10 Essential (primary) hypertension: Secondary | ICD-10-CM | POA: Insufficient documentation

## 2017-05-03 DIAGNOSIS — M1990 Unspecified osteoarthritis, unspecified site: Secondary | ICD-10-CM | POA: Insufficient documentation

## 2017-05-03 DIAGNOSIS — A044 Other intestinal Escherichia coli infections: Secondary | ICD-10-CM | POA: Insufficient documentation

## 2017-05-03 DIAGNOSIS — I2510 Atherosclerotic heart disease of native coronary artery without angina pectoris: Secondary | ICD-10-CM | POA: Insufficient documentation

## 2017-05-03 DIAGNOSIS — D649 Anemia, unspecified: Secondary | ICD-10-CM | POA: Insufficient documentation

## 2017-05-06 ENCOUNTER — Ambulatory Visit: Payer: Medicare Other | Admitting: Physician Assistant

## 2017-05-06 IMAGING — CT CT ABD-PELV W/ CM
2 of 6 series · 15 of 46 positions shown, 17 images · IV contrast (Omni 300)
Comparison: MRI of the abdomen 03/13/2014. CT the abdomen and
pelvis 08/29/2013.

CLINICAL DATA: 69-year-old female with right renal mass. Dropping
hemoglobin. History of Crohn's disease.

EXAM:
CT ABDOMEN AND PELVIS WITH CONTRAST
TECHNIQUE: Multidetector CT imaging of the abdomen and pelvis was performed
using the standard protocol following bolus administration of
intravenous contrast.
CONTRAST:  80 mL of Omnipaque 300.

[Series 2: abd/ pelvis 5.0 i30f 1 · axial · 0.77mm/px · z∈[-394,+16]mm · 12 of 94 slices shown, 14 images]
[im 6/94  soft-tissue]
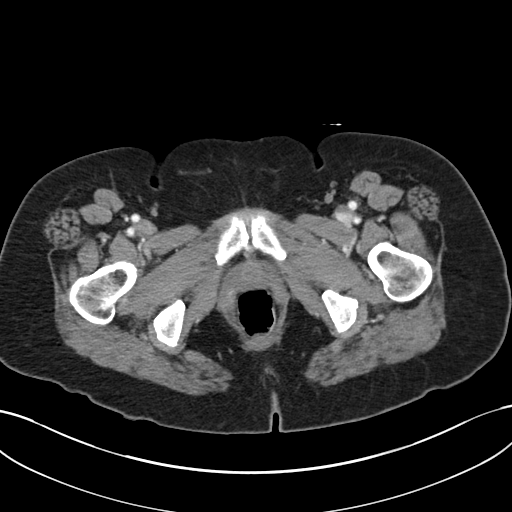
[im 6/94  bone]
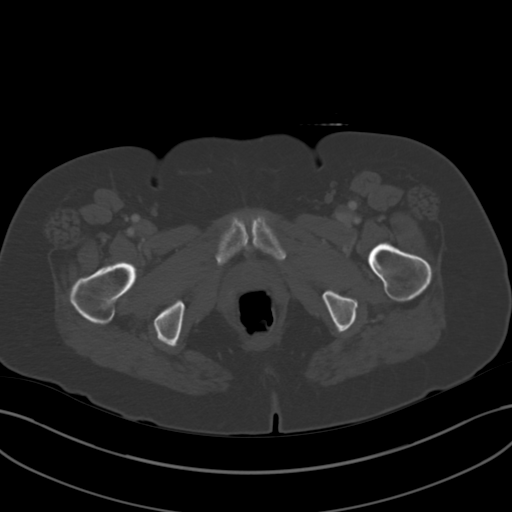
[im 16/94  soft-tissue]
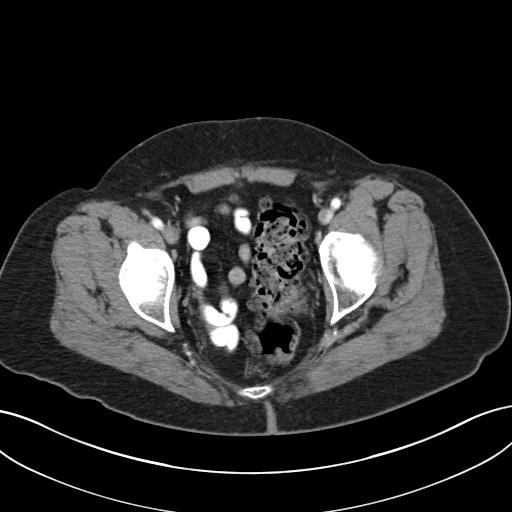
[im 21/94  soft-tissue]
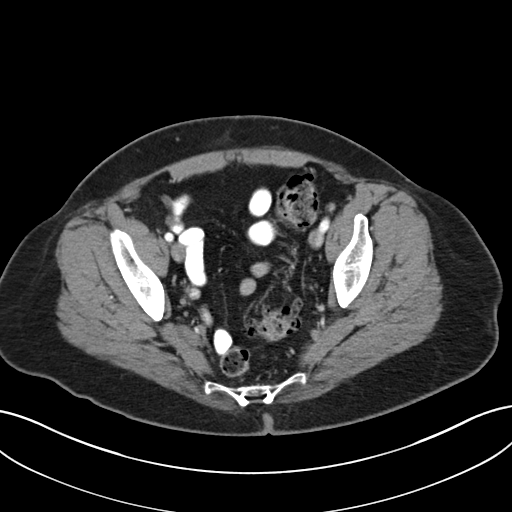
[im 26/94  soft-tissue]
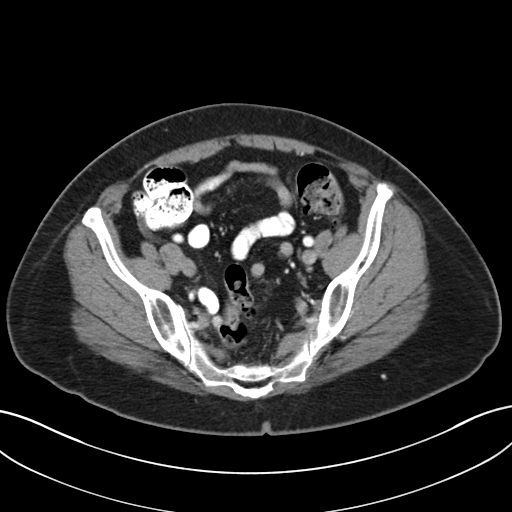
[im 37/94  soft-tissue]
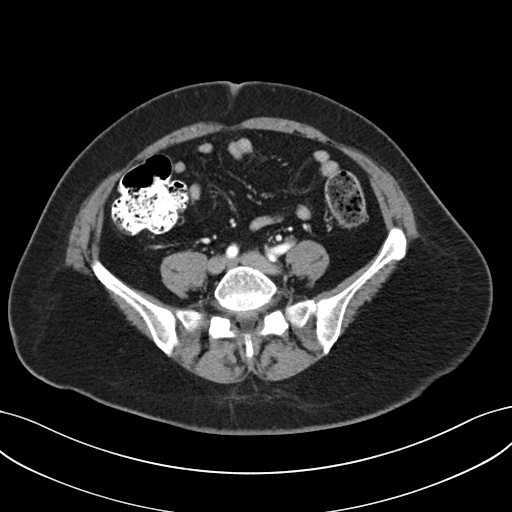
[im 42/94  soft-tissue]
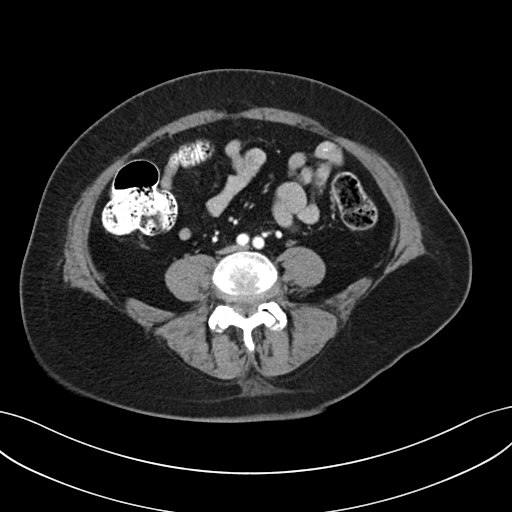
[im 52/94  soft-tissue]
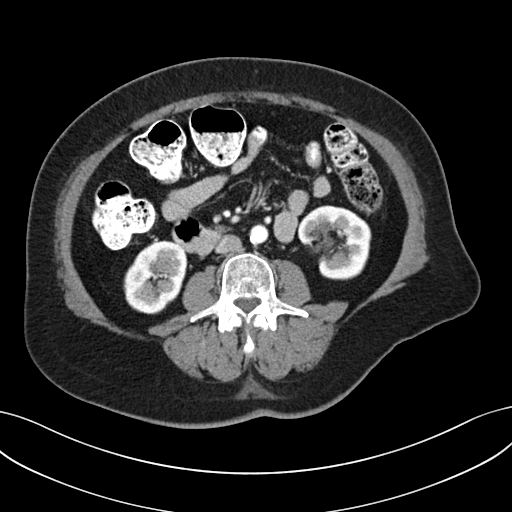
[im 57/94  soft-tissue]
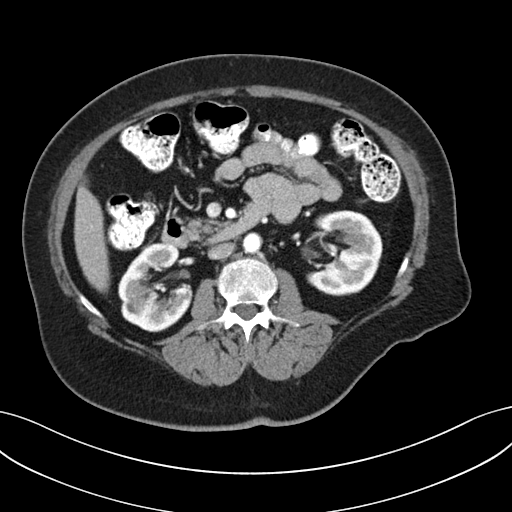
[im 68/94  soft-tissue]
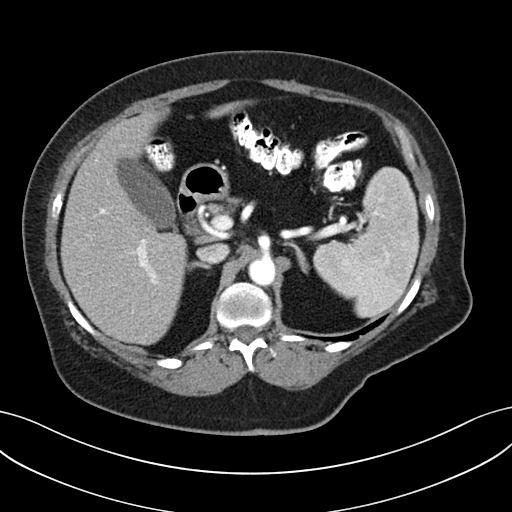
[im 68/94  bone]
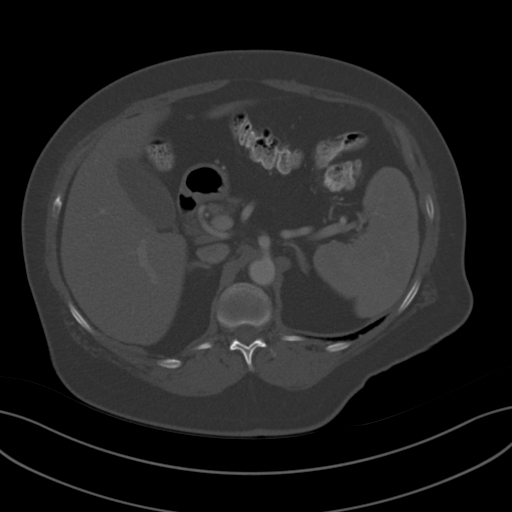
[im 73/94  soft-tissue]
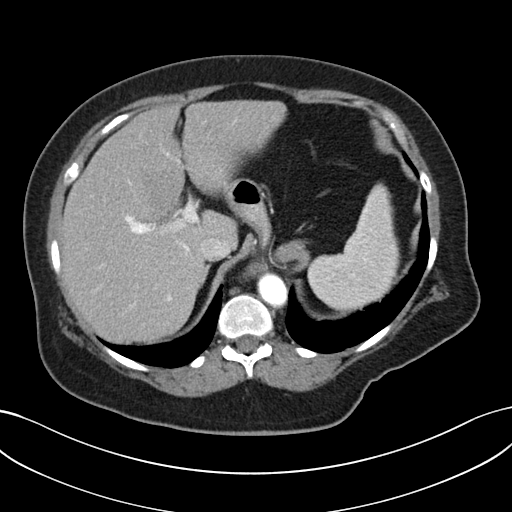
[im 78/94  soft-tissue]
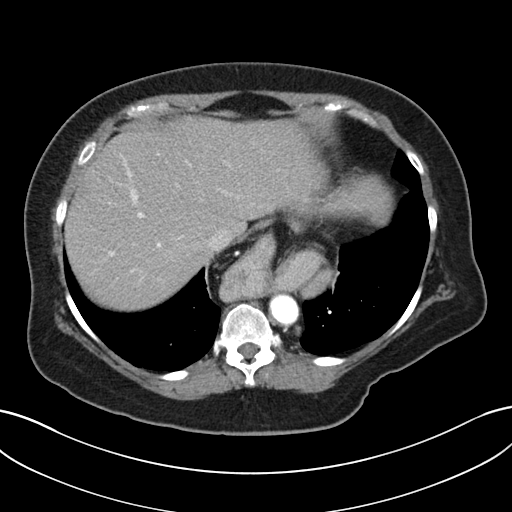
[im 88/94  soft-tissue]
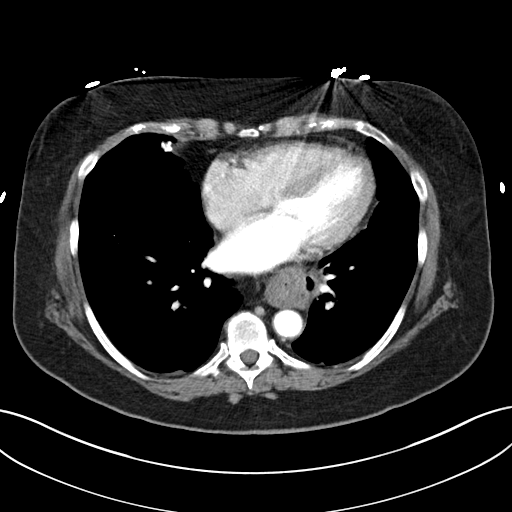

[Series 5: coronals · coronal · 0.70mm/px · 3 of 148 slices shown]
[im 50/148  soft-tissue]
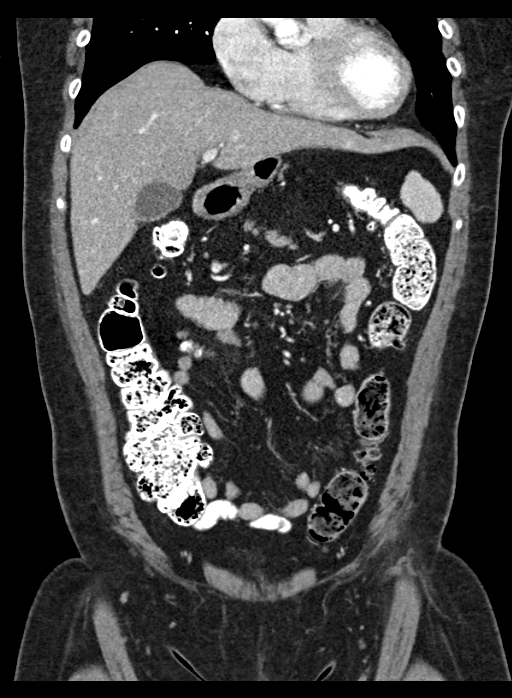
[im 66/148  soft-tissue]
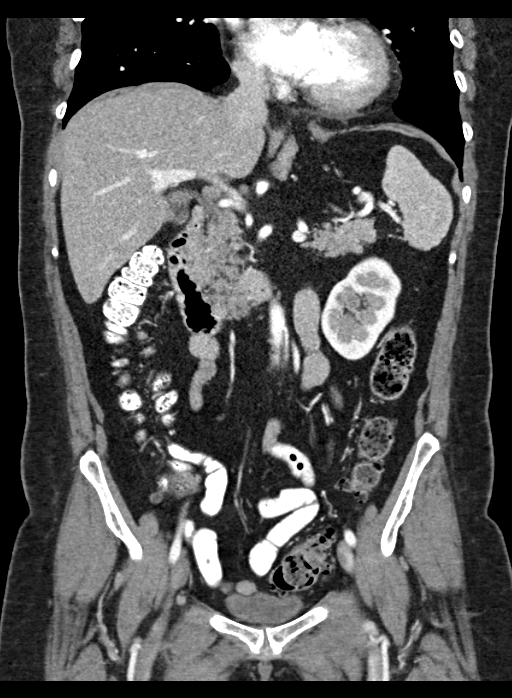
[im 82/148  soft-tissue]
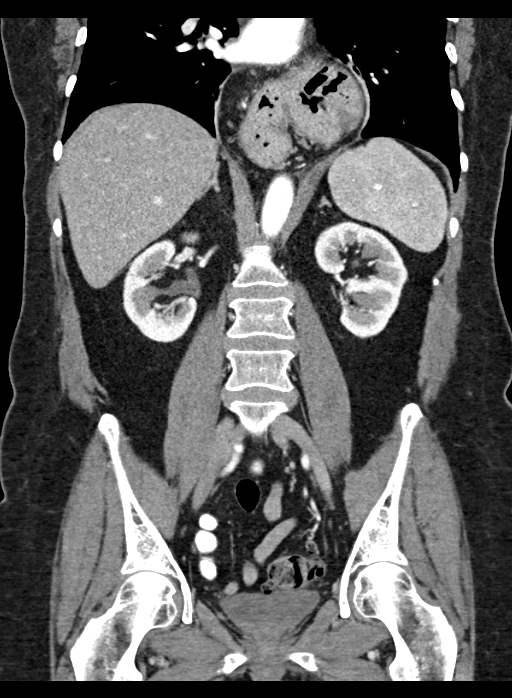

[15 of 46 positions shown; findings below may reference images not displayed]

FINDINGS: Lower chest: Large hiatal hernia. Calcified granuloma in the right
middle lobe.

Hepatobiliary: No cystic or solid hepatic lesions. No intra or
extrahepatic biliary ductal dilatation. Gallbladder is normal in
appearance.

Pancreas: No pancreatic mass. No pancreatic ductal dilatation. No
pancreatic or peripancreatic fluid or inflammatory changes.

Spleen: Unremarkable.

Adrenals/Urinary Tract: Bilateral adrenal glands and the left kidney
are normal in appearance. In the medial aspect of the interpolar
region of the right kidney there is are very subtle lesion which is
smaller than prior examinations, currently measuring only 5 x 8 mm
(image 39 of series 2), favored to represent an involuting mildly
complex cysts. There is also some cortical thinning in the upper
pole of the right kidney, presumably post infectious/ inflammatory
scarring (unchanged). No other suspicious appearing renal lesions
are noted. No hydroureteronephrosis. Urinary bladder is normal in
appearance.

Stomach/Bowel: Intra-abdominal portion of the stomach is normal. No
pathologic dilatation of small bowel or colon. Numerous colonic
diverticulae are noted, particularly in the sigmoid colon, without
surrounding inflammatory changes to suggest an acute diverticulitis
at this time. Terminal ileum is grossly unremarkable in appearance.
Normal appendix.

Vascular/Lymphatic: Mild atherosclerosis throughout the abdominal
and pelvic vasculature, without evidence of aneurysm or dissection.
No lymphadenopathy noted in the abdomen or pelvis.

Reproductive: Status post hysterectomy. Ovaries are not confidently
identified may be surgically absent or atrophic.

Other: No significant volume of ascites.  No pneumoperitoneum.

Musculoskeletal: There are no aggressive appearing lytic or blastic
lesions noted in the visualized portions of the skeleton.
IMPRESSION: 1. Interval decrease in size of a very small 8 x 5 mm lesion in the
interpolar region of the right kidney, which is nonspecific in
appearance, but favored to represent an involuting minimally complex
cyst.
2. No findings to account for the patient's low hemoglobin
identified on today's examination.
3. Colonic diverticulosis without findings to suggest acute
diverticulitis at this time.
4. Normal appendix.
5. Large hiatal hernia.
6. Mild atherosclerosis.

## 2017-05-18 ENCOUNTER — Ambulatory Visit: Payer: Medicare Other

## 2017-05-18 NOTE — Progress Notes (Deleted)
Patient ID: Rolena Knutson                 DOB: 1945/10/04                      MRN: [id number]     HPI: Agapita Savarino is a 72 y.o. female referred by Dr. [name] to HTN clinic.  PMH includes CAD, hypertension, and R kidney mass. PCP tried to adjust BP medication few weeks ago but patient is not comfortable with anyone but cardiologist office adjusting her "heart" medication.  Patient experienced some lower extremities edema after initiating amlodipine 5mg  but reports complete resolution of symptoms few weeks into therapy. She denies dizziness, headaches, chest pain or increased fatigue.  Current HTN meds:  Carvedilol 25mg  twice daily Losartan 50mg  daily Amlodipine 5mg  daily  BP goal: 130/80 (140/90 due to age and dizziness)  Family History: includes Alzheimer's disease in her mother; Colon cancer in her maternal grandfather; Heart attack in her father; Heart disease in her brother and mother.   Social History: denies tobacco and alcohol use  Diet: significant amount of ready to eat "lean cuisine" meal every week  Exercise: minimal physical activity after hip injury and surgery last year  Home BP readings: 13 readings; average 159/91 (pulse 72-83 bpm)  Wt Readings from Last 3 Encounters:  03/11/17 159 lb 12.8 oz (72.5 kg)  03/02/17 162 lb 9.6 oz (73.8 kg)  01/08/17 163 lb 3.2 oz (74 kg)   BP Readings from Last 3 Encounters:  04/13/17 (!) 148/86  03/11/17 (!) 160/100  03/02/17 (!) 174/98   Pulse Readings from Last 3 Encounters:  04/13/17 70  03/11/17 80  03/02/17 72    Past Medical History:  Diagnosis Date  . Anemia   . Arthritis   . Bleeding diathesis ([place]) 02/22/2013  . Closed right hip fracture ([place]) 05/28/2016  . Coronary artery disease   . E coli enteritis   . GERD (gastroesophageal reflux disease)   . H/O hiatal hernia   . Head injury   . Hypertension   . Iron deficiency anemia, unspecified 02/22/2013  . Mass of right kidney 08/22/2013   2.5 cm solid lesion seen on  5/14 CT  . Radicular pain of thoracic region 08/22/2013   Left T-8 started after pushing heavy chest 1/15; intermittent; no focal neuro deficit    Current Outpatient Medications on File Prior to Visit  Medication Sig Dispense Refill  . amLODipine (NORVASC) 5 MG tablet Take 1 tablet (5 mg total) by mouth daily. 30 tablet 3  . aspirin EC 81 MG EC tablet Take 1 tablet (81 mg total) by mouth daily. 30 tablet 1  . carvedilol (COREG) 25 MG tablet Take 25 mg by mouth 2 (two) times daily with a meal.    . clopidogrel (PLAVIX) 75 MG tablet Take 1 tablet (75 mg total) by mouth daily with breakfast. 30 tablet 2  . feeding supplement (BOOST / RESOURCE BREEZE) LIQD Take 1 Container by mouth 2 (two) times daily between meals. (Patient not taking: Reported on 04/13/2017)  0  . levothyroxine (SYNTHROID, LEVOTHROID) 88 MCG tablet Take 88 mcg by mouth daily.    . lipase/protease/amylase (CREON) 36000 UNITS CPEP capsule Take 36,000 Units by mouth daily. May take another 36,000 units if needed    . losartan (COZAAR) 50 MG tablet Take 1 tablet (50 mg total) by mouth daily. 90 tablet 1  . methocarbamol (ROBAXIN) 500 MG tablet Take 1 tablet (500 mg total)  by mouth every 6 (six) hours as needed for muscle spasms. 30 tablet 3  . nitroGLYCERIN (NITROSTAT) 0.4 MG SL tablet Place 1 tablet (0.4 mg total) under the tongue every 5 (five) minutes as needed for chest pain. 25 tablet 1  . rosuvastatin (CRESTOR) 20 MG tablet Take 0.5 tablets (10 mg total) by mouth every other day. 30 tablet 2  . venlafaxine XR (EFFEXOR-XR) 150 MG 24 hr capsule Take 1 capsule by mouth daily.     Current Facility-Administered Medications on File Prior to Visit  Medication Dose Route Frequency Provider Last Rate Last Dose  . fentaNYL (SUBLIMAZE) injection    Anesthesia Intra-op Marinda Elk A, CRNA   50 mcg at 05/31/16 0721  . lactated ringers infusion    Continuous PRN Marinda Elk A, CRNA 50 mL/hr at 06/03/16 2300      Allergies  Allergen  Reactions  . Cortisone Anaphylaxis    Some forms of cortisone  . Monosodium Glutamate Anaphylaxis  . Prednisone Anaphylaxis    confusion  . Shellfish Allergy Anaphylaxis  . Buprenorphine Hcl Nausea And Vomiting and Hypertension    SHAKING  . Morphine And Related Nausea And Vomiting, Other (See Comments) and Hypertension    SHAKING  . Readi-Cat [Barium Sulfate] Diarrhea    Patient does not want to drink Readi-Cat because it gives her diarrhea  . Demerol [Meperidine]     UNSPECIFIED REACTION   . Sulfa Antibiotics     UNSPECIFIED REACTION   . Xanax [Alprazolam] Other (See Comments)    "makes me crazy"  . Benzoic Acid Nausea Only    There were no vitals taken for this visit.  No problem-specific Assessment & Plan notes found for this encounter.  Tommy Medal PharmD CPP [facility] [address] [place] [address] 05/18/2017 7:20 AM

## 2017-07-15 ENCOUNTER — Other Ambulatory Visit: Payer: Self-pay | Admitting: Oncology

## 2017-07-15 DIAGNOSIS — D508 Other iron deficiency anemias: Secondary | ICD-10-CM

## 2017-07-15 DIAGNOSIS — K909 Intestinal malabsorption, unspecified: Secondary | ICD-10-CM

## 2017-07-19 ENCOUNTER — Other Ambulatory Visit: Payer: Medicare Other

## 2017-07-26 ENCOUNTER — Other Ambulatory Visit: Payer: Self-pay

## 2017-07-30 ENCOUNTER — Ambulatory Visit: Payer: Self-pay | Admitting: Cardiovascular Disease

## 2017-08-02 ENCOUNTER — Encounter: Payer: Medicare Other | Admitting: Oncology

## 2017-08-13 ENCOUNTER — Encounter (HOSPITAL_COMMUNITY): Payer: Self-pay

## 2017-08-13 ENCOUNTER — Emergency Department (HOSPITAL_COMMUNITY): Payer: PPO

## 2017-08-13 ENCOUNTER — Inpatient Hospital Stay (HOSPITAL_COMMUNITY)
Admission: EM | Admit: 2017-08-13 | Discharge: 2017-08-16 | DRG: 246 | Disposition: A | Discharge status: Home / Self Care | Payer: PPO | Attending: Cardiovascular Disease | Admitting: Cardiovascular Disease

## 2017-08-13 ENCOUNTER — Telehealth: Payer: Self-pay | Admitting: Cardiovascular Disease

## 2017-08-13 DIAGNOSIS — Z7902 Long term (current) use of antithrombotics/antiplatelets: Secondary | ICD-10-CM | POA: Diagnosis not present

## 2017-08-13 DIAGNOSIS — R079 Chest pain, unspecified: Secondary | ICD-10-CM | POA: Diagnosis not present

## 2017-08-13 DIAGNOSIS — R42 Dizziness and giddiness: Secondary | ICD-10-CM | POA: Diagnosis not present

## 2017-08-13 DIAGNOSIS — I110 Hypertensive heart disease with heart failure: Secondary | ICD-10-CM | POA: Diagnosis not present

## 2017-08-13 DIAGNOSIS — E785 Hyperlipidemia, unspecified: Secondary | ICD-10-CM | POA: Diagnosis present

## 2017-08-13 DIAGNOSIS — E781 Pure hyperglyceridemia: Secondary | ICD-10-CM | POA: Diagnosis not present

## 2017-08-13 DIAGNOSIS — K219 Gastro-esophageal reflux disease without esophagitis: Secondary | ICD-10-CM | POA: Diagnosis present

## 2017-08-13 DIAGNOSIS — Z79899 Other long term (current) drug therapy: Secondary | ICD-10-CM

## 2017-08-13 DIAGNOSIS — Z886 Allergy status to analgesic agent status: Secondary | ICD-10-CM | POA: Diagnosis not present

## 2017-08-13 DIAGNOSIS — I214 Non-ST elevation (NSTEMI) myocardial infarction: Principal | ICD-10-CM | POA: Diagnosis present

## 2017-08-13 DIAGNOSIS — I2510 Atherosclerotic heart disease of native coronary artery without angina pectoris: Secondary | ICD-10-CM | POA: Diagnosis not present

## 2017-08-13 DIAGNOSIS — I5041 Acute combined systolic (congestive) and diastolic (congestive) heart failure: Secondary | ICD-10-CM | POA: Diagnosis present

## 2017-08-13 DIAGNOSIS — Z91013 Allergy to seafood: Secondary | ICD-10-CM | POA: Diagnosis not present

## 2017-08-13 DIAGNOSIS — I25110 Atherosclerotic heart disease of native coronary artery with unstable angina pectoris: Secondary | ICD-10-CM | POA: Diagnosis present

## 2017-08-13 DIAGNOSIS — Z888 Allergy status to other drugs, medicaments and biological substances status: Secondary | ICD-10-CM

## 2017-08-13 DIAGNOSIS — E876 Hypokalemia: Secondary | ICD-10-CM | POA: Diagnosis not present

## 2017-08-13 DIAGNOSIS — Z79890 Hormone replacement therapy (postmenopausal): Secondary | ICD-10-CM

## 2017-08-13 DIAGNOSIS — Z882 Allergy status to sulfonamides status: Secondary | ICD-10-CM

## 2017-08-13 DIAGNOSIS — E782 Mixed hyperlipidemia: Secondary | ICD-10-CM | POA: Diagnosis not present

## 2017-08-13 DIAGNOSIS — I200 Unstable angina: Secondary | ICD-10-CM | POA: Diagnosis present

## 2017-08-13 DIAGNOSIS — Z955 Presence of coronary angioplasty implant and graft: Secondary | ICD-10-CM

## 2017-08-13 DIAGNOSIS — R05 Cough: Secondary | ICD-10-CM | POA: Diagnosis not present

## 2017-08-13 DIAGNOSIS — I5030 Unspecified diastolic (congestive) heart failure: Secondary | ICD-10-CM | POA: Diagnosis not present

## 2017-08-13 LAB — BASIC METABOLIC PANEL
ANION GAP: 14 (ref 5–15)
BUN: 9 mg/dL (ref 6–20)
CHLORIDE: 106 mmol/L (ref 101–111)
CO2: 22 mmol/L (ref 22–32)
Calcium: 9.1 mg/dL (ref 8.9–10.3)
Creatinine, Ser: 0.79 mg/dL (ref 0.44–1.00)
GFR calc Af Amer: >60 mL/min (ref >60)
GLUCOSE: 163 mg/dL — AB (ref 65–99)
POTASSIUM: 3.1 mmol/L — AB (ref 3.5–5.1)
Sodium: 142 mmol/L (ref 135–145)

## 2017-08-13 LAB — I-STAT TROPONIN, ED: Troponin i, poc: 0.18 ng/mL (ref 0.00–0.08)

## 2017-08-13 LAB — CBC
HEMATOCRIT: 35.3 % — AB (ref 36.0–46.0)
HEMOGLOBIN: 11.6 g/dL — AB (ref 12.0–15.0)
MCH: 28.4 pg (ref 26.0–34.0)
MCHC: 32.9 g/dL (ref 30.0–36.0)
MCV: 86.3 fL (ref 78.0–100.0)
Platelets: 222 10*3/uL (ref 150–400)
RBC: 4.09 MIL/uL (ref 3.87–5.11)
RDW: 13.9 % (ref 11.5–15.5)
WBC: 7.8 10*3/uL (ref 4.0–10.5)

## 2017-08-13 LAB — HEPATIC FUNCTION PANEL
ALK PHOS: 62 U/L (ref 38–126)
ALT: 11 U/L — ABNORMAL LOW (ref 14–54)
AST: 25 U/L (ref 15–41)
Albumin: 3.7 g/dL (ref 3.5–5.0)
BILIRUBIN DIRECT: 0.1 mg/dL (ref 0.1–0.5)
BILIRUBIN TOTAL: 0.9 mg/dL (ref 0.3–1.2)
Indirect Bilirubin: 0.8 mg/dL (ref 0.3–0.9)
Total Protein: 7.1 g/dL (ref 6.5–8.1)

## 2017-08-13 LAB — PROTIME-INR
INR: 1.17
Prothrombin Time: 14.8 seconds (ref 11.4–15.2)

## 2017-08-13 LAB — D-DIMER, QUANTITATIVE: D-Dimer, Quant: 0.91 ug/mL-FEU — ABNORMAL HIGH (ref 0.00–0.50)

## 2017-08-13 LAB — HEMOGLOBIN A1C
Hgb A1c MFr Bld: 6.1 % — ABNORMAL HIGH (ref 4.8–5.6)
Mean Plasma Glucose: 128.37 mg/dL

## 2017-08-13 LAB — MAGNESIUM: Magnesium: 1.8 mg/dL (ref 1.7–2.4)

## 2017-08-13 LAB — TSH: TSH: 1.442 u[IU]/mL (ref 0.350–4.500)

## 2017-08-13 LAB — T4, FREE: FREE T4: 1.09 ng/dL (ref 0.61–1.12)

## 2017-08-13 LAB — TROPONIN I: Troponin I: 0.93 ng/mL (ref <0.03)

## 2017-08-13 MED ORDER — ONDANSETRON HCL 4 MG/2ML IJ SOLN
4.0000 mg | Freq: Once | INTRAMUSCULAR | Status: AC
  Administered 2017-08-13: 4 mg via INTRAVENOUS
  Filled 2017-08-13: qty 2

## 2017-08-13 MED ORDER — HEPARIN BOLUS VIA INFUSION
4000.0000 [IU] | Freq: Once | INTRAVENOUS | Status: AC
  Administered 2017-08-13: 4000 [IU] via INTRAVENOUS
  Filled 2017-08-13: qty 4000

## 2017-08-13 MED ORDER — CARVEDILOL 25 MG PO TABS
25.0000 mg | ORAL_TABLET | Freq: Two times a day (BID) | ORAL | Status: DC
  Administered 2017-08-14 – 2017-08-16 (×5): 25 mg via ORAL
  Filled 2017-08-13 (×5): qty 1

## 2017-08-13 MED ORDER — CLOPIDOGREL BISULFATE 75 MG PO TABS
75.0000 mg | ORAL_TABLET | Freq: Every day | ORAL | Status: DC
  Administered 2017-08-14: 75 mg via ORAL
  Filled 2017-08-13: qty 1

## 2017-08-13 MED ORDER — ROSUVASTATIN CALCIUM 10 MG PO TABS
10.0000 mg | ORAL_TABLET | ORAL | Status: DC
  Administered 2017-08-14 – 2017-08-16 (×2): 10 mg via ORAL
  Filled 2017-08-13 (×3): qty 1

## 2017-08-13 MED ORDER — ASPIRIN EC 81 MG PO TBEC
81.0000 mg | DELAYED_RELEASE_TABLET | Freq: Every day | ORAL | Status: DC
  Administered 2017-08-14 – 2017-08-16 (×3): 81 mg via ORAL
  Filled 2017-08-13 (×3): qty 1

## 2017-08-13 MED ORDER — VENLAFAXINE HCL ER 75 MG PO CP24
150.0000 mg | ORAL_CAPSULE | Freq: Every day | ORAL | Status: DC
  Administered 2017-08-14 – 2017-08-16 (×3): 150 mg via ORAL
  Filled 2017-08-13 (×2): qty 1
  Filled 2017-08-13: qty 2

## 2017-08-13 MED ORDER — PANCRELIPASE (LIP-PROT-AMYL) 12000-38000 UNITS PO CPEP
36000.0000 [IU] | ORAL_CAPSULE | Freq: Every day | ORAL | Status: DC
  Administered 2017-08-15 – 2017-08-16 (×2): 36000 [IU] via ORAL
  Filled 2017-08-13 (×3): qty 1
  Filled 2017-08-13: qty 3

## 2017-08-13 MED ORDER — METHOCARBAMOL 500 MG PO TABS
500.0000 mg | ORAL_TABLET | Freq: Four times a day (QID) | ORAL | Status: DC | PRN
Start: 2017-08-13 — End: 2017-08-16

## 2017-08-13 MED ORDER — BOOST / RESOURCE BREEZE PO LIQD CUSTOM
1.0000 | Freq: Two times a day (BID) | ORAL | Status: DC
  Administered 2017-08-15: 1 via ORAL

## 2017-08-13 MED ORDER — ONDANSETRON HCL 4 MG/2ML IJ SOLN
4.0000 mg | Freq: Four times a day (QID) | INTRAMUSCULAR | Status: DC | PRN
  Administered 2017-08-14: 4 mg via INTRAVENOUS
  Filled 2017-08-13: qty 2

## 2017-08-13 MED ORDER — LOSARTAN POTASSIUM 50 MG PO TABS
50.0000 mg | ORAL_TABLET | Freq: Every day | ORAL | Status: DC
  Administered 2017-08-14 – 2017-08-16 (×3): 50 mg via ORAL
  Filled 2017-08-13 (×4): qty 1

## 2017-08-13 MED ORDER — NITROGLYCERIN 0.4 MG SL SUBL: 0.4000 mg | SUBLINGUAL_TABLET | SUBLINGUAL | Status: DC | PRN

## 2017-08-13 MED ORDER — NITROGLYCERIN IN D5W 200-5 MCG/ML-% IV SOLN
5.0000 ug/min | INTRAVENOUS | Status: DC
  Administered 2017-08-13: 5 ug/min via INTRAVENOUS

## 2017-08-13 MED ORDER — ACETAMINOPHEN 325 MG PO TABS
650.0000 mg | ORAL_TABLET | ORAL | Status: DC | PRN
  Administered 2017-08-14 – 2017-08-15 (×2): 650 mg via ORAL
  Filled 2017-08-13 (×2): qty 2

## 2017-08-13 MED ORDER — HEPARIN (PORCINE) IN NACL 100-0.45 UNIT/ML-% IJ SOLN
850.0000 [IU]/h | INTRAMUSCULAR | Status: DC
  Administered 2017-08-13: 850 [IU]/h via INTRAVENOUS
  Filled 2017-08-13 (×2): qty 250

## 2017-08-13 MED ORDER — LEVOTHYROXINE SODIUM 88 MCG PO TABS
88.0000 ug | ORAL_TABLET | Freq: Every day | ORAL | Status: DC
  Administered 2017-08-14 – 2017-08-16 (×3): 88 ug via ORAL
  Filled 2017-08-13 (×3): qty 1

## 2017-08-13 MED ORDER — NITROGLYCERIN 0.4 MG SL SUBL
0.4000 mg | SUBLINGUAL_TABLET | SUBLINGUAL | Status: DC | PRN
  Administered 2017-08-13 (×2): 0.4 mg via SUBLINGUAL
  Filled 2017-08-13: qty 1

## 2017-08-13 MED ORDER — DIPHENHYDRAMINE HCL 50 MG/ML IJ SOLN
25.0000 mg | Freq: Once | INTRAMUSCULAR | Status: AC
  Administered 2017-08-13: 25 mg via INTRAVENOUS
  Filled 2017-08-13: qty 1

## 2017-08-13 MED ORDER — NITROGLYCERIN IN D5W 200-5 MCG/ML-% IV SOLN
0.0000 ug/min | Freq: Once | INTRAVENOUS | Status: AC
  Administered 2017-08-13: 5 ug/min via INTRAVENOUS
  Filled 2017-08-13: qty 250

## 2017-08-13 MED ORDER — ZOLPIDEM TARTRATE 5 MG PO TABS
5.0000 mg | ORAL_TABLET | Freq: Every evening | ORAL | Status: DC | PRN
  Administered 2017-08-14 – 2017-08-15 (×3): 5 mg via ORAL
  Filled 2017-08-13 (×3): qty 1

## 2017-08-13 NOTE — ED Provider Notes (Signed)
[facility] EMERGENCY DEPARTMENT Provider Note   CSN: [id number] Arrival date & time: 08/13/17  1615     History   Chief Complaint Chief Complaint  Patient presents with  . Chest Pain    HPI Caitlyn [name] is a 72 y.o. female.  HPI Patient reports that she awakened this morning at 830.  She reports she did not have chest pain upon awakening but went back to sleep for a while and then woke up midday or later with chest pain.  Reports it has been both sharp and aching quality on her left side.  It radiates to her back.  She reports it does make her feel short of breath.  No lower extremity swelling or calf pain.  Patient reports that she has a history of a cardiac stent.  She reports that the pain feels similar or may be worse.  She reports that she did not take her regular medications yesterday or today because of not feeling well.  She however does not endorse fever, productive cough, nausea, vomiting or abdominal pain. Past Medical History:  Diagnosis Date  . Anemia   . Arthritis   . Bleeding diathesis ([place]) 02/22/2013  . Closed right hip fracture ([place]) 05/28/2016  . Coronary artery disease   . E coli enteritis   . GERD (gastroesophageal reflux disease)   . H/O hiatal hernia   . Head injury   . Hypertension   . Iron deficiency anemia, unspecified 02/22/2013  . Mass of right kidney 08/22/2013   2.5 cm solid lesion seen on 5/14 CT  . Radicular pain of thoracic region 08/22/2013   Left T-8 started after pushing heavy chest 1/15; intermittent; no focal neuro deficit    Patient Active Problem List   Diagnosis Date Noted  . Hypertension   . Head injury   . H/O hiatal hernia   . GERD (gastroesophageal reflux disease)   . E coli enteritis   . Coronary artery disease   . Arthritis   . Anemia   . GI bleeding 06/29/2016  . Status post hip replacement, right 06/29/2016  . Acute GI bleeding   . History of cardiac cath   . CAD (coronary artery disease), native  coronary artery 06/07/2016  . Postop check   . Displaced intertrochanteric fracture of right femur, initial encounter for closed fracture ([place]) 06/03/2016  . Status post insertion of drug-eluting stent into left anterior descending (LAD) artery   . NSTEMI (non-ST elevated myocardial infarction) ([place])   . Hypothyroidism, acquired 05/28/2016  . Closed fracture of right hip ([place]) 05/28/2016  . Closed right hip fracture ([place]) 05/28/2016  . Essential hypertension 10/29/2015  . Hyperlipidemia 10/29/2015  . Mass of right kidney 08/22/2013  . Radicular pain of thoracic region 08/22/2013  . Iron deficiency anemia 02/22/2013  . Iron deficiency anemia, unspecified 02/22/2013  . Bleeding diathesis ([place]) 02/22/2013    Past Surgical History:  Procedure Laterality Date  . ABDOMINAL HYSTERECTOMY    . BREAST BIOPSY    . BREAST LUMPECTOMY Left   . BREAST REDUCTION SURGERY  1981  . BREAST SURGERY  1981   rt-nodes taken out-non cancer  . BREAST SURGERY  1981   rt lumpectomy  . CARDIAC CATHETERIZATION N/A 06/01/2016   Procedure: LEFT HEART CATH;  Surgeon: Lorretta Harp, MD;  Location: [facility];  Service: Cardiovascular;  Laterality: N/A;  . CARDIAC CATHETERIZATION N/A 06/01/2016   Procedure: Coronary Stent Intervention;  Surgeon: Lorretta Harp, MD;  Location: [facility];  Service: Cardiovascular;  Laterality: N/A;  . COLONOSCOPY    . FACIAL FRACTURE SURGERY  2000  . FEMUR IM NAIL Right 06/03/2016   Procedure: INTRAMEDULLARY (IM) NAIL FEMORAL;  Surgeon: Rod Can, MD;  Location: [facility];  Service: Orthopedics;  Laterality: Right;  . TONSILLECTOMY    . TRIGGER FINGER RELEASE Right 11/28/2013   Procedure: RELEASE TRIGGER FINGER/A-1 PULLEY RIGHT LONG FINGER;  Surgeon: Cammie Sickle, MD;  Location: [facility];  Service: Orthopedics;  Laterality: Right;  . URETHRA SURGERY     age 40     OB History   None      Home Medications    Prior to Admission  medications   Medication Sig Start Date End Date Taking? Authorizing Provider  amLODipine (NORVASC) 5 MG tablet Take 1 tablet (5 mg total) by mouth daily. 03/11/17 06/09/17  Almyra Deforest, PA  aspirin EC 81 MG EC tablet Take 1 tablet (81 mg total) by mouth daily. 07/02/16   Caren Griffins, MD  carvedilol (COREG) 25 MG tablet Take 25 mg by mouth 2 (two) times daily with a meal.    [provider]  clopidogrel (PLAVIX) 75 MG tablet Take 1 tablet (75 mg total) by mouth daily with breakfast. 04/13/17   Lorretta Harp, MD  feeding supplement (BOOST / RESOURCE BREEZE) LIQD Take 1 Container by mouth 2 (two) times daily between meals. Patient not taking: Reported on 04/13/2017 06/06/16   Eugenie Filler, MD  levothyroxine (SYNTHROID, LEVOTHROID) 88 MCG tablet Take 88 mcg by mouth daily.    [provider]  lipase/protease/amylase (CREON) 36000 UNITS CPEP capsule Take 36,000 Units by mouth daily. May take another 36,000 units if needed    [provider]  losartan (COZAAR) 50 MG tablet Take 1 tablet (50 mg total) by mouth daily. 03/02/17   Lorretta Harp, MD  methocarbamol (ROBAXIN) 500 MG tablet Take 1 tablet (500 mg total) by mouth every 6 (six) hours as needed for muscle spasms. 06/06/16   Eugenie Filler, MD  nitroGLYCERIN (NITROSTAT) 0.4 MG SL tablet Place 1 tablet (0.4 mg total) under the tongue every 5 (five) minutes as needed for chest pain. 01/08/17 04/08/17  Barrett, Evelene Croon, PA-C  rosuvastatin (CRESTOR) 20 MG tablet Take 0.5 tablets (10 mg total) by mouth every other day. 04/13/17   Lorretta Harp, MD  venlafaxine XR (EFFEXOR-XR) 150 MG 24 hr capsule Take 1 capsule by mouth daily. 03/08/17   [provider]    Family History Family History  Problem Relation Age of Onset  . Heart attack Father   . Heart disease Mother   . Alzheimer's disease Mother   . Heart disease Brother   . Colon cancer Maternal Grandfather   . Stomach cancer Neg Hx   . Rectal  cancer Neg Hx   . Esophageal cancer Neg Hx   . Liver cancer Neg Hx     Social History Social History   Tobacco Use  . Smoking status: Never Smoker  . Smokeless tobacco: Never Used  Substance Use Topics  . Alcohol use: No    Alcohol/week: 0.0 oz  . Drug use: No     Allergies   Cortisone; Monosodium glutamate; Prednisone; Shellfish allergy; Buprenorphine hcl; Morphine and related; Readi-cat [barium sulfate]; Demerol [meperidine]; Sulfa antibiotics; Xanax [alprazolam]; and Benzoic acid   Review of Systems Review of Systems 10 Systems reviewed and are negative for acute change except  as noted in the HPI.   Physical Exam Updated Vital Signs BP (!) 161/99   Pulse 84   Temp 98.5 F (36.9 C) (Oral)   Resp 15   SpO2 99%   Physical Exam  Constitutional: She is oriented to person, place, and time. She appears well-developed and well-nourished.  Patient is very anxious in appearance.  She is complaining of chest pain and holding the left side of her chest.  No respiratory distress.  Color is normal.  Vital signs on monitor are stable.  HENT:  Head: Normocephalic and atraumatic.  Nose: Nose normal.  Mouth/Throat: Oropharynx is clear and moist.  Eyes: Pupils are equal, round, and reactive to light. EOM are normal.  Neck: Neck supple.  Cardiovascular: Normal rate, regular rhythm, normal heart sounds and intact distal pulses.  Pulmonary/Chest: Effort normal and breath sounds normal. She exhibits no tenderness.  Abdominal: Soft. Bowel sounds are normal. She exhibits no distension. There is no tenderness. There is no guarding.  Musculoskeletal: Normal range of motion. She exhibits no edema or tenderness.  Neurological: She is alert and oriented to person, place, and time. No cranial nerve deficit. She exhibits normal muscle tone. Coordination normal.  Skin: Skin is warm and dry.  Dry eschar excoriations on the patient's upper back and forearms.  No secondary infection.  Psychiatric:   Patient is very anxious.     ED Treatments / Results  Labs (all labs ordered are listed, but only abnormal results are displayed) Labs Reviewed  BASIC METABOLIC PANEL - Abnormal; Notable for the following components:      Result Value   Potassium 3.1 (*)    Glucose, Bld 163 (*)    All other components within normal limits  CBC - Abnormal; Notable for the following components:   Hemoglobin 11.6 (*)    HCT 35.3 (*)    All other components within normal limits  I-STAT TROPONIN, ED - Abnormal; Notable for the following components:   Troponin i, poc 0.18 (*)    All other components within normal limits  HEPARIN LEVEL (UNFRACTIONATED)  CBC  D-DIMER, QUANTITATIVE (NOT AT [facility])    EKG EKG Interpretation  Date/Time:  Friday August 13 2017 16:28:58 EDT Ventricular Rate:  88 PR Interval:    QRS Duration: 85 QT Interval:  384 QTC Calculation: 443 R Axis:   35 Text Interpretation:  Sinus rhythm Multiple premature complexes, vent & supraven Minimal ST elevation, inferior leads Baseline wander in lead(s) I III aVL no STEMI subtle anterior ST coving Confirmed by Charlesetta Shanks [phone]) on 08/13/2017 5:08:23 PM   Radiology Dg Chest 2 View  Result Date: 08/13/2017 CLINICAL DATA:  Pt arrives to ED via EMS from home with left sided sharp chest pain sunder ribs that radiates to back that began when she got up to use bathroom this a.m. Pain is worse with deep breathing. Pt denies cough. Hx cardiac cath. EXAM: CHEST - 2 VIEW COMPARISON:  06/29/2016 FINDINGS: Cardiac silhouette is normal in size. Left coronary artery stent is stable. No mediastinal or hilar masses. No evidence of adenopathy. Moderate to large hiatal hernia. Clear lungs.  No pleural effusion or pneumothorax. Skeletal structures are demineralized but intact. IMPRESSION: No active cardiopulmonary disease. Electronically Signed   By: Lajean Manes M.D.   On: 08/13/2017 17:21    Procedures Procedures (including critical care  time) CRITICAL CARE Performed by: Si Gaul   Total critical care time: 45 minutes  Critical care time was exclusive of separately billable  procedures and treating other patients.  Critical care was necessary to treat or prevent imminent or life-threatening deterioration.  Critical care was time spent personally by me on the following activities: development of treatment plan with patient and/or surrogate as well as nursing, discussions with consultants, evaluation of patient's response to treatment, examination of patient, obtaining history from patient or surrogate, ordering and performing treatments and interventions, ordering and review of laboratory studies, ordering and review of radiographic studies, pulse oximetry and re-evaluation of patient's condition.  Medications Ordered in ED Medications  nitroGLYCERIN (NITROSTAT) SL tablet 0.4 mg (0.4 mg Sublingual Given 08/13/17 1737)  heparin ADULT infusion 100 units/mL (25000 units/222mL sodium chloride 0.45%) (850 Units/hr Intravenous New Bag/Given 08/13/17 1925)  nitroGLYCERIN 50 mg in dextrose 5 % 250 mL (0.2 mg/mL) infusion (has no administration in time range)  diphenhydrAMINE (BENADRYL) injection 25 mg (has no administration in time range)  nitroGLYCERIN 50 mg in dextrose 5 % 250 mL (0.2 mg/mL) infusion (5 mcg/min Intravenous New Bag/Given 08/13/17 1758)  heparin bolus via infusion 4,000 Units (4,000 Units Intravenous Bolus from Bag 08/13/17 1925)  ondansetron (ZOFRAN) injection 4 mg (4 mg Intravenous Given 08/13/17 1828)     Initial Impression / Assessment and Plan / ED Course  I have reviewed the triage vital signs and the nursing notes.  Pertinent labs & imaging results that were available during my care of the patient were reviewed by me and considered in my medical decision making (see chart for details).     Consult: Case reviewed with Dr. Debara Pickett.  Dr. Angelena Form to the emergency department for patient admission.  Final  Clinical Impressions(s) / ED Diagnoses   Final diagnoses:  NSTEMI (non-ST elevated myocardial infarction) ([place])  Unstable angina [facility])   Patient presents as outlined above.  Troponin is showing mild elevation.  Patient does have a known cardiac stent.  EKG has subtle changes but does not meet STEMI criteria.  Patient started on heparin and nitroglycerin.  Cardiology consulted for admission. ED Discharge Orders    None       Charlesetta Shanks, MD 08/13/17 [phone]

## 2017-08-13 NOTE — ED Notes (Signed)
Attempted to call report

## 2017-08-13 NOTE — ED Triage Notes (Signed)
Pt arrives to ED via EMS from home with left sided chest pain sunder ribs that radiates to back that began when she got up to use bathroom. Pain is worse with deep breathing.   3NTG-  324 ASA. Pain went from 8/10 to 4/10.

## 2017-08-13 NOTE — Telephone Encounter (Signed)
Received call from patient she stated she started having chest pain 1 hour ago.She took 2 NTG with relief.Stated chest pain has returned.Rates pain # 6.Advised to call 911 and go to ED.Left message with Trish.

## 2017-08-13 NOTE — Telephone Encounter (Signed)
New message    Pt c/o of Chest Pain: STAT if CP now or developed within 24 hours  1. Are you having CP right now?  Yes   2. Are you experiencing any other symptoms (ex. SOB, nausea, vomiting, sweating)?  Feeling pressure in chest and throat like she is gonna be sick  3. How long have you been experiencing CP? yes  4. Is your CP continuous or coming and going?  continuous   5. Have you taken Nitroglycerin? Has taken 2  ?

## 2017-08-13 NOTE — Progress Notes (Signed)
ANTICOAGULATION CONSULT NOTE - Initial Consult  Pharmacy Consult for heparin Indication: chest pain/ACS  Allergies  Allergen Reactions  . Cortisone Anaphylaxis    Some forms of cortisone  . Monosodium Glutamate Anaphylaxis  . Prednisone Anaphylaxis    confusion  . Shellfish Allergy Anaphylaxis  . Buprenorphine Hcl Nausea And Vomiting and Hypertension    SHAKING  . Morphine And Related Nausea And Vomiting, Other (See Comments) and Hypertension    SHAKING  . Readi-Cat [Barium Sulfate] Diarrhea    Patient does not want to drink Readi-Cat because it gives her diarrhea  . Demerol [Meperidine]     UNSPECIFIED REACTION   . Sulfa Antibiotics     UNSPECIFIED REACTION   . Xanax [Alprazolam] Other (See Comments)    "makes me crazy"  . Benzoic Acid Nausea Only    Patient Measurements:  Actual body weight: 72 kg (patient reported) Ideal body weight: 57 kg Heparin Dosing Weight: 72 kg   Vital Signs: Temp: 98.5 F (36.9 C) (04/05 1632) Temp Source: Oral (04/05 1632) BP: 169/76 (04/05 1632) Pulse Rate: 83 (04/05 1632)  Labs: Recent Labs    08/13/17 1639  HGB 11.6*  HCT 35.3*  PLT 222  CREATININE 0.79    CrCl cannot be calculated (Unknown ideal weight.).   Medical History: Past Medical History:  Diagnosis Date  . Anemia   . Arthritis   . Bleeding diathesis ([place]) 02/22/2013  . Closed right hip fracture ([place]) 05/28/2016  . Coronary artery disease   . E coli enteritis   . GERD (gastroesophageal reflux disease)   . H/O hiatal hernia   . Head injury   . Hypertension   . Iron deficiency anemia, unspecified 02/22/2013  . Mass of right kidney 08/22/2013   2.5 cm solid lesion seen on 5/14 CT  . Radicular pain of thoracic region 08/22/2013   Left T-8 started after pushing heavy chest 1/15; intermittent; no focal neuro deficit    Medications:   (Not in a hospital admission)  Assessment: 68 YOF here w/ chest pain and elevated troponin. Pharmacy consulted to start IV  heparin for ACS. H/H low. Plt wnl. Per patient, she is not on any anticoagulants at home.   Goal of Therapy:  Heparin level 0.3-0.7 units/ml Monitor platelets by anticoagulation protocol: Yes   Plan:  -Heparin 4000 units IV once, then heparin drip at 850 units/hr  -F/u 6 hr HL -Monitor daily HL, CBC and s/s of bleeding  Albertina Parr, PharmD., BCPS Clinical Pharmacist Clinical phone for 08/13/17: [phone]

## 2017-08-13 NOTE — ED Notes (Signed)
Cardiology at bedside.

## 2017-08-13 NOTE — ED Notes (Signed)
Nurse starting I and drawing labs.

## 2017-08-13 NOTE — H&P (Signed)
Cardiology Admission History and Physical:   Patient ID: Caitlyn [name]; MRN: [id number]; DOB: Jan 13, 1946   Admission date: 08/13/2017  Primary Care Provider: Josetta Huddle, MD Primary Cardiologist: Quay Burow, MD  Primary Electrophysiologist:  None  Chief Complaint:  Chest pain  Patient Profile:   Caitlyn [name] is a 72 y.o. female with a history of CAD, HTN, anemia who is presenting to the ED with c/o chest pain.   History of Present Illness:   Caitlyn [name] is a 72 yo female with history of CAD, HTN, anemia, GERD and right kidney mass who is presenting to the ED with c/o chest pain. She has known CAD. Last cardiac cath 06/01/16 with severe stenosis in the mid LAD treated with a Synergy drug eluting stent. She has been on ASA and Plavix.  She had the onset of left sided chest pain today. This radiated to her back. She has dyspnea. No LE edema. She has had some confusion over the past two day.  Currently on IV NTG and her chest pain is improved.  EKG shows sinus rhythm with subtle ST depression. Troponin 0.18.    Past Medical History:  Diagnosis Date  . Anemia   . Arthritis   . Bleeding diathesis ([place]) 02/22/2013  . Closed right hip fracture ([place]) 05/28/2016  . Coronary artery disease   . E coli enteritis   . GERD (gastroesophageal reflux disease)   . H/O hiatal hernia   . Head injury   . Hypertension   . Iron deficiency anemia, unspecified 02/22/2013  . Mass of right kidney 08/22/2013   2.5 cm solid lesion seen on 5/14 CT  . Radicular pain of thoracic region 08/22/2013   Left T-8 started after pushing heavy chest 1/15; intermittent; no focal neuro deficit    Past Surgical History:  Procedure Laterality Date  . ABDOMINAL HYSTERECTOMY    . BREAST BIOPSY    . BREAST LUMPECTOMY Left   . BREAST REDUCTION SURGERY  1981  . BREAST SURGERY  1981   rt-nodes taken out-non cancer  . BREAST SURGERY  1981   rt lumpectomy  . CARDIAC CATHETERIZATION N/A 06/01/2016   Procedure: LEFT  HEART CATH;  Surgeon: Lorretta Harp, MD;  Location: [facility];  Service: Cardiovascular;  Laterality: N/A;  . CARDIAC CATHETERIZATION N/A 06/01/2016   Procedure: Coronary Stent Intervention;  Surgeon: Lorretta Harp, MD;  Location: [facility];  Service: Cardiovascular;  Laterality: N/A;  . COLONOSCOPY    . FACIAL FRACTURE SURGERY  2000  . FEMUR IM NAIL Right 06/03/2016   Procedure: INTRAMEDULLARY (IM) NAIL FEMORAL;  Surgeon: Rod Can, MD;  Location: [facility];  Service: Orthopedics;  Laterality: Right;  . TONSILLECTOMY    . TRIGGER FINGER RELEASE Right 11/28/2013   Procedure: RELEASE TRIGGER FINGER/A-1 PULLEY RIGHT LONG FINGER;  Surgeon: Cammie Sickle, MD;  Location: [facility];  Service: Orthopedics;  Laterality: Right;  . URETHRA SURGERY     age 41     Medications Prior to Admission: Prior to Admission medications   Medication Sig Start Date End Date Taking? Authorizing Provider  amLODipine (NORVASC) 5 MG tablet Take 1 tablet (5 mg total) by mouth daily. 03/11/17 06/09/17  Almyra Deforest, PA  aspirin EC 81 MG EC tablet Take 1 tablet (81 mg total) by mouth daily. 07/02/16   Caren Griffins, MD  carvedilol (COREG) 25 MG tablet Take 25 mg by mouth 2 (two) times daily with a meal.  [provider]  clopidogrel (PLAVIX) 75 MG tablet Take 1 tablet (75 mg total) by mouth daily with breakfast. 04/13/17   Lorretta Harp, MD  feeding supplement (BOOST / RESOURCE BREEZE) LIQD Take 1 Container by mouth 2 (two) times daily between meals. Patient not taking: Reported on 04/13/2017 06/06/16   Eugenie Filler, MD  levothyroxine (SYNTHROID, LEVOTHROID) 88 MCG tablet Take 88 mcg by mouth daily.    [provider]  lipase/protease/amylase (CREON) 36000 UNITS CPEP capsule Take 36,000 Units by mouth daily. May take another 36,000 units if needed    [provider]  losartan (COZAAR) 50 MG tablet Take 1 tablet (50 mg total) by mouth daily.  03/02/17   Lorretta Harp, MD  methocarbamol (ROBAXIN) 500 MG tablet Take 1 tablet (500 mg total) by mouth every 6 (six) hours as needed for muscle spasms. 06/06/16   Eugenie Filler, MD  nitroGLYCERIN (NITROSTAT) 0.4 MG SL tablet Place 1 tablet (0.4 mg total) under the tongue every 5 (five) minutes as needed for chest pain. 01/08/17 04/08/17  Barrett, Evelene Croon, PA-C  rosuvastatin (CRESTOR) 20 MG tablet Take 0.5 tablets (10 mg total) by mouth every other day. 04/13/17   Lorretta Harp, MD  venlafaxine XR (EFFEXOR-XR) 150 MG 24 hr capsule Take 1 capsule by mouth daily. 03/08/17   [provider]     Allergies:    Allergies  Allergen Reactions  . Cortisone Anaphylaxis    Some forms of cortisone  . Monosodium Glutamate Anaphylaxis  . Prednisone Anaphylaxis    confusion  . Shellfish Allergy Anaphylaxis  . Buprenorphine Hcl Nausea And Vomiting and Hypertension    SHAKING  . Morphine And Related Nausea And Vomiting, Other (See Comments) and Hypertension    SHAKING  . Readi-Cat [Barium Sulfate] Diarrhea    Patient does not want to drink Readi-Cat because it gives her diarrhea  . Demerol [Meperidine]     UNSPECIFIED REACTION   . Sulfa Antibiotics     UNSPECIFIED REACTION   . Xanax [Alprazolam] Other (See Comments)    "makes me crazy"  . Benzoic Acid Nausea Only    Social History:   Social History   Socioeconomic History  . Marital status: Widowed    Spouse name: Not on file  . Number of children: 1  . Years of education: Not on file  . Highest education level: Not on file  Occupational History  . Occupation: retired  Scientific laboratory technician  . Financial resource strain: Not on file  . Food insecurity:    Worry: Not on file    Inability: Not on file  . Transportation needs:    Medical: Not on file    Non-medical: Not on file  Tobacco Use  . Smoking status: Never Smoker  . Smokeless tobacco: Never Used  Substance and Sexual Activity  . Alcohol use: No    Alcohol/week:  0.0 oz  . Drug use: No  . Sexual activity: Not on file  Lifestyle  . Physical activity:    Days per week: Not on file    Minutes per session: Not on file  . Stress: Not on file  Relationships  . Social connections:    Talks on phone: Not on file    Gets together: Not on file    Attends religious service: Not on file    Active member of club or organization: Not on file    Attends meetings of clubs or organizations: Not on file  Relationship status: Not on file  . Intimate partner violence:    Fear of current or ex partner: Not on file    Emotionally abused: Not on file    Physically abused: Not on file    Forced sexual activity: Not on file  Other Topics Concern  . Not on file  Social History Narrative  . Not on file    Family History:   The patient's family history includes Alzheimer's disease in her mother; Colon cancer in her maternal grandfather; Heart attack in her father; Heart disease in her brother and mother. There is no history of Stomach cancer, Rectal cancer, Esophageal cancer, or Liver cancer.    ROS:  Please see the history of present illness.  All other ROS reviewed and negative.     Physical Exam/Data:   Vitals:   08/13/17 1731 08/13/17 1736 08/13/17 1745 08/13/17 1747  BP: (!) 176/110 (!) 157/129 (!) 136/94 127/87  Pulse: 83 76 (!) 106 93  Resp: 16 (!) 21 (!) 26 (!) 30  Temp:      TempSrc:      SpO2: 100% 100% 100% 100%   No intake or output data in the 24 hours ending 08/13/17 1809 There were no vitals filed for this visit. There is no height or weight on file to calculate BMI.  General:  Well nourished, well developed, in no acute distress HEENT: normal Lymph: no adenopathy Neck: no JVD Endocrine:  No thryomegaly Vascular: No carotid bruits; FA pulses 2+ bilaterally without bruits  Cardiac:  normal S1, S2; RRR; no murmur  Lungs:  clear to auscultation bilaterally, no wheezing, rhonchi or rales  Abd: soft, nontender, no hepatomegaly  Ext: no  LE edema Musculoskeletal:  No deformities, BUE and BLE strength normal and equal Skin: warm and dry  Neuro:  CNs 2-12 intact, no focal abnormalities noted Psych:  Normal affect    EKG:  The ECG that was done today was personally reviewed and demonstrates sinus with PACs and subtle ST depression in the inferior and anterolateral leads  Relevant CV Studies:   Laboratory Data:  Chemistry Recent Labs  Lab 08/13/17 1639  NA 142  K 3.1*  CL 106  CO2 22  GLUCOSE 163*  BUN 9  CREATININE 0.79  CALCIUM 9.1  GFRNONAA >60  GFRAA >60  ANIONGAP 14    No results for input(s): PROT, ALBUMIN, AST, ALT, ALKPHOS, BILITOT in the last 168 hours. Hematology Recent Labs  Lab 08/13/17 1639  WBC 7.8  RBC 4.09  HGB 11.6*  HCT 35.3*  MCV 86.3  MCH 28.4  MCHC 32.9  RDW 13.9  PLT 222   Cardiac EnzymesNo results for input(s): TROPONINI in the last 168 hours.  Recent Labs  Lab 08/13/17 1652  TROPIPOC 0.18*    BNPNo results for input(s): BNP, PROBNP in the last 168 hours.  DDimer No results for input(s): DDIMER in the last 168 hours.  Radiology/Studies:  Dg Chest 2 View  Result Date: 08/13/2017 CLINICAL DATA:  Pt arrives to ED via EMS from home with left sided sharp chest pain sunder ribs that radiates to back that began when she got up to use bathroom this a.m. Pain is worse with deep breathing. Pt denies cough. Hx cardiac cath. EXAM: CHEST - 2 VIEW COMPARISON:  06/29/2016 FINDINGS: Cardiac silhouette is normal in size. Left coronary artery stent is stable. No mediastinal or hilar masses. No evidence of adenopathy. Moderate to large hiatal hernia. Clear lungs.  No pleural effusion  or pneumothorax. Skeletal structures are demineralized but intact. IMPRESSION: No active cardiopulmonary disease. Electronically Signed   By: Lajean Manes M.D.   On: 08/13/2017 17:21    Assessment and Plan:   1. CAD/NSTEMI: Known CAD. Prior LAD stent. Admitted with classic story for unstable angina. Will  admit to stepdown. Cycle troponin. Treat with IV NTG and IV heparin. Echo tomorrow. Continue ASA and plavix. Continue beta blocker and statin. If we can cool her down, cath Monday. Will check d-dimer to exclude PE.  2. HTN: BP is controlled.   Severity of Illness: The appropriate patient status for this patient is INPATIENT. Inpatient status is judged to be reasonable and necessary in order to provide the required intensity of service to ensure the patient's safety. The patient's presenting symptoms, physical exam findings, and initial radiographic and laboratory data in the context of their chronic comorbidities is felt to place them at high risk for further clinical deterioration. Furthermore, it is not anticipated that the patient will be medically stable for discharge from the hospital within 2 midnights of admission. The following factors support the patient status of inpatient.   " The patient's presenting symptoms include chest pain. " The worrisome physical exam findings include abnormal EKG, elevated troponin " The initial radiographic and laboratory data are worrisome because of elevated troponin " The chronic co-morbidities include CAD, HTN   * I certify that at the point of admission it is my clinical judgment that the patient will require inpatient hospital care spanning beyond 2 midnights from the point of admission due to high intensity of service, high risk for further deterioration and high frequency of surveillance required.*    For questions or updates, please contact [place] Please consult [email or web address] for contact info under Cardiology/STEMI.    Signed, Lauree Chandler, MD  08/13/2017 6:09 PM

## 2017-08-14 ENCOUNTER — Other Ambulatory Visit (HOSPITAL_COMMUNITY): Payer: PPO

## 2017-08-14 ENCOUNTER — Encounter (HOSPITAL_COMMUNITY)
Admission: EM | Disposition: A | Discharge status: Home / Self Care | Payer: Self-pay | Source: Home / Self Care | Attending: Cardiovascular Disease

## 2017-08-14 ENCOUNTER — Inpatient Hospital Stay (HOSPITAL_COMMUNITY): Payer: PPO

## 2017-08-14 DIAGNOSIS — I5030 Unspecified diastolic (congestive) heart failure: Secondary | ICD-10-CM

## 2017-08-14 DIAGNOSIS — I2510 Atherosclerotic heart disease of native coronary artery without angina pectoris: Secondary | ICD-10-CM

## 2017-08-14 HISTORY — PX: CORONARY STENT INTERVENTION: CATH118234

## 2017-08-14 HISTORY — PX: LEFT HEART CATH AND CORONARY ANGIOGRAPHY: CATH118249

## 2017-08-14 LAB — CBC
HCT: 32.6 % — ABNORMAL LOW (ref 36.0–46.0)
HEMATOCRIT: 33.8 % — AB (ref 36.0–46.0)
Hemoglobin: 10.9 g/dL — ABNORMAL LOW (ref 12.0–15.0)
Hemoglobin: 10.7 g/dL — ABNORMAL LOW (ref 12.0–15.0)
MCH: 28.5 pg (ref 26.0–34.0)
MCH: 29.1 pg (ref 26.0–34.0)
MCHC: 32.2 g/dL (ref 30.0–36.0)
MCHC: 32.8 g/dL (ref 30.0–36.0)
MCV: 88.3 fL (ref 78.0–100.0)
MCV: 88.6 fL (ref 78.0–100.0)
PLATELETS: 213 10*3/uL (ref 150–400)
Platelets: 231 10*3/uL (ref 150–400)
RBC: 3.83 MIL/uL — ABNORMAL LOW (ref 3.87–5.11)
RBC: 3.68 MIL/uL — ABNORMAL LOW (ref 3.87–5.11)
RDW: 14.3 % (ref 11.5–15.5)
RDW: 14.3 % (ref 11.5–15.5)
WBC: 7.9 10*3/uL (ref 4.0–10.5)
WBC: 6.7 10*3/uL (ref 4.0–10.5)

## 2017-08-14 LAB — HEPARIN LEVEL (UNFRACTIONATED)
Heparin Unfractionated: 0.42 IU/mL (ref 0.30–0.70)
Heparin Unfractionated: 0.44 IU/mL (ref 0.30–0.70)

## 2017-08-14 LAB — MRSA PCR SCREENING: MRSA by PCR: NEGATIVE

## 2017-08-14 LAB — BASIC METABOLIC PANEL
Anion gap: 12 (ref 5–15)
BUN: 12 mg/dL (ref 6–20)
CO2: 23 mmol/L (ref 22–32)
CREATININE: 0.9 mg/dL (ref 0.44–1.00)
Calcium: 8.3 mg/dL — ABNORMAL LOW (ref 8.9–10.3)
Chloride: 105 mmol/L (ref 101–111)
GFR calc Af Amer: >60 mL/min (ref >60)
Glucose, Bld: 152 mg/dL — ABNORMAL HIGH (ref 65–99)
Potassium: 3 mmol/L — ABNORMAL LOW (ref 3.5–5.1)
SODIUM: 140 mmol/L (ref 135–145)

## 2017-08-14 LAB — ECHOCARDIOGRAM COMPLETE
HEIGHTINCHES: 65 in
Weight: 2452.8 oz

## 2017-08-14 LAB — LIPID PANEL
CHOL/HDL RATIO: 3.3 ratio
Cholesterol: 150 mg/dL (ref 0–200)
HDL: 45 mg/dL (ref >40)
LDL CALC: 57 mg/dL (ref 0–99)
TRIGLYCERIDES: 241 mg/dL — AB (ref <150)
VLDL: 48 mg/dL — AB (ref 0–40)

## 2017-08-14 LAB — TROPONIN I
TROPONIN I: 1.77 ng/mL — AB (ref <0.03)
Troponin I: 2.08 ng/mL (ref <0.03)

## 2017-08-14 SURGERY — LEFT HEART CATH AND CORONARY ANGIOGRAPHY
Anesthesia: LOCAL

## 2017-08-14 MED ORDER — SODIUM CHLORIDE 0.9 % WEIGHT BASED INFUSION: 3.0000 mL/kg/h | INTRAVENOUS | Status: DC

## 2017-08-14 MED ORDER — SODIUM CHLORIDE 0.9% FLUSH: 3.0000 mL | Freq: Two times a day (BID) | INTRAVENOUS | Status: DC

## 2017-08-14 MED ORDER — HYDRALAZINE HCL 20 MG/ML IJ SOLN: 5.0000 mg | INTRAMUSCULAR | Status: AC | PRN

## 2017-08-14 MED ORDER — HEPARIN (PORCINE) IN NACL 2-0.9 UNIT/ML-% IJ SOLN
INTRAMUSCULAR | Status: AC | PRN
  Administered 2017-08-14: 500 mL

## 2017-08-14 MED ORDER — PANCRELIPASE (LIP-PROT-AMYL) 36000-114000 UNITS PO CPEP
36000.0000 [IU] | ORAL_CAPSULE | Freq: Once | ORAL | Status: AC
  Administered 2017-08-14: 36000 [IU] via ORAL
  Filled 2017-08-14: qty 1

## 2017-08-14 MED ORDER — HEPARIN (PORCINE) IN NACL 2-0.9 UNIT/ML-% IJ SOLN
INTRAMUSCULAR | Status: AC
  Filled 2017-08-14: qty 1000

## 2017-08-14 MED ORDER — POTASSIUM CHLORIDE 20 MEQ/15ML (10%) PO SOLN
60.0000 meq | Freq: Once | ORAL | Status: AC
Start: 2017-08-14 — End: 2017-08-14
  Administered 2017-08-14: 60 meq via ORAL
  Filled 2017-08-14: qty 45

## 2017-08-14 MED ORDER — ASPIRIN 81 MG PO CHEW: 81.0000 mg | CHEWABLE_TABLET | ORAL | Status: DC

## 2017-08-14 MED ORDER — DIPHENHYDRAMINE HCL 25 MG PO CAPS
25.0000 mg | ORAL_CAPSULE | Freq: Every evening | ORAL | Status: DC | PRN
  Administered 2017-08-14: 25 mg via ORAL
  Filled 2017-08-14: qty 1

## 2017-08-14 MED ORDER — CLOPIDOGREL BISULFATE 300 MG PO TABS
ORAL_TABLET | ORAL | Status: AC
  Filled 2017-08-14: qty 1

## 2017-08-14 MED ORDER — LABETALOL HCL 5 MG/ML IV SOLN
10.0000 mg | INTRAVENOUS | Status: AC | PRN
Start: 2017-08-14 — End: 2017-08-14

## 2017-08-14 MED ORDER — SODIUM CHLORIDE 0.9 % IV SOLN: 250.0000 mL | INTRAVENOUS | Status: DC | PRN

## 2017-08-14 MED ORDER — MIDAZOLAM HCL 2 MG/2ML IJ SOLN
INTRAMUSCULAR | Status: DC | PRN
  Administered 2017-08-14: 1 mg via INTRAVENOUS

## 2017-08-14 MED ORDER — SODIUM CHLORIDE 0.9% FLUSH
3.0000 mL | Freq: Two times a day (BID) | INTRAVENOUS | Status: DC
  Administered 2017-08-14 – 2017-08-16 (×5): 3 mL via INTRAVENOUS

## 2017-08-14 MED ORDER — FENTANYL CITRATE (PF) 100 MCG/2ML IJ SOLN
INTRAMUSCULAR | Status: AC
  Filled 2017-08-14: qty 2

## 2017-08-14 MED ORDER — MIDAZOLAM HCL 2 MG/2ML IJ SOLN
INTRAMUSCULAR | Status: AC
  Filled 2017-08-14: qty 2

## 2017-08-14 MED ORDER — CLOPIDOGREL BISULFATE 300 MG PO TABS
ORAL_TABLET | ORAL | Status: DC | PRN
  Administered 2017-08-14: 300 mg via ORAL

## 2017-08-14 MED ORDER — SODIUM CHLORIDE 0.9 % IV SOLN
INTRAVENOUS | Status: AC
  Administered 2017-08-14: 12:00:00 via INTRAVENOUS

## 2017-08-14 MED ORDER — VERAPAMIL HCL 2.5 MG/ML IV SOLN
INTRAVENOUS | Status: AC
  Filled 2017-08-14: qty 2

## 2017-08-14 MED ORDER — SODIUM CHLORIDE 0.9% FLUSH: 3.0000 mL | INTRAVENOUS | Status: DC | PRN

## 2017-08-14 MED ORDER — CLOPIDOGREL BISULFATE 75 MG PO TABS
75.0000 mg | ORAL_TABLET | Freq: Every day | ORAL | Status: DC
  Administered 2017-08-15 – 2017-08-16 (×2): 75 mg via ORAL
  Filled 2017-08-14 (×2): qty 1

## 2017-08-14 MED ORDER — SODIUM CHLORIDE 0.9 % IV SOLN
INTRAVENOUS | Status: AC | PRN
  Administered 2017-08-14: 1.007 mL/kg/h via INTRAVENOUS

## 2017-08-14 MED ORDER — SODIUM CHLORIDE 0.9 % WEIGHT BASED INFUSION: 1.0000 mL/kg/h | INTRAVENOUS | Status: DC

## 2017-08-14 MED ORDER — PERFLUTREN LIPID MICROSPHERE
INTRAVENOUS | Status: AC
  Administered 2017-08-14: 2 mL via INTRAVENOUS
  Filled 2017-08-14: qty 10

## 2017-08-14 MED ORDER — LIDOCAINE HCL (PF) 1 % IJ SOLN
INTRAMUSCULAR | Status: DC | PRN
  Administered 2017-08-14: 2 mL

## 2017-08-14 MED ORDER — PERFLUTREN LIPID MICROSPHERE
1.0000 mL | INTRAVENOUS | Status: AC | PRN
  Administered 2017-08-14: 2 mL via INTRAVENOUS
  Filled 2017-08-14: qty 10

## 2017-08-14 MED ORDER — HEPARIN SODIUM (PORCINE) 1000 UNIT/ML IJ SOLN
INTRAMUSCULAR | Status: AC
  Filled 2017-08-14: qty 1

## 2017-08-14 MED ORDER — HEPARIN SODIUM (PORCINE) 1000 UNIT/ML IJ SOLN
INTRAMUSCULAR | Status: DC | PRN
  Administered 2017-08-14 (×2): 4000 [IU] via INTRAVENOUS

## 2017-08-14 MED ORDER — LIDOCAINE HCL (PF) 1 % IJ SOLN
INTRAMUSCULAR | Status: AC
  Filled 2017-08-14: qty 30

## 2017-08-14 MED ORDER — IOPAMIDOL (ISOVUE-370) INJECTION 76%
INTRAVENOUS | Status: DC | PRN
  Administered 2017-08-14: 170 mL via INTRA_ARTERIAL

## 2017-08-14 MED ORDER — FENTANYL CITRATE (PF) 100 MCG/2ML IJ SOLN
INTRAMUSCULAR | Status: DC | PRN
  Administered 2017-08-14: 25 ug via INTRAVENOUS

## 2017-08-14 MED ORDER — HEPARIN (PORCINE) IN NACL 2-0.9 UNIT/ML-% IJ SOLN
INTRAMUSCULAR | Status: DC | PRN
  Administered 2017-08-14: 10 mL via INTRA_ARTERIAL

## 2017-08-14 SURGICAL SUPPLY — 21 items
BALLN SAPPHIRE 2.5X12 (BALLOONS) ×2
BALLN SAPPHIRE ~~LOC~~ 3.25X8 (BALLOONS) ×1 IMPLANT
BALLOON SAPPHIRE 2.5X12 (BALLOONS) IMPLANT
BAND CMPR LRG ZPHR (HEMOSTASIS) ×1
BAND ZEPHYR COMPRESS 30 LONG (HEMOSTASIS) ×1 IMPLANT
CATH 5FR JL3.5 JR4 ANG PIG MP (CATHETERS) ×1 IMPLANT
CATH VISTA GUIDE 6FR XBLAD3.0 (CATHETERS) ×1 IMPLANT
GUIDEWIRE INQWIRE 1.5J.035X260 (WIRE) IMPLANT
INQWIRE 1.5J .035X260CM (WIRE) ×2
KIT ENCORE 26 ADVANTAGE (KITS) ×2 IMPLANT
KIT HEART LEFT (KITS) ×2 IMPLANT
NDL PERC 21GX4CM (NEEDLE) IMPLANT
NEEDLE PERC 21GX4CM (NEEDLE) ×2 IMPLANT
PACK CARDIAC CATHETERIZATION (CUSTOM PROCEDURE TRAY) ×2 IMPLANT
SHEATH RAIN RADIAL 21G 6FR (SHEATH) ×1 IMPLANT
STENT SYNERGY DES 2.50X8 (Permanent Stent) ×1 IMPLANT
STENT SYNERGY DES 3X12 (Permanent Stent) ×1 IMPLANT
SYR MEDRAD MARK V 150ML (SYRINGE) ×2 IMPLANT
TRANSDUCER W/STOPCOCK (MISCELLANEOUS) ×2 IMPLANT
TUBING CIL FLEX 10 FLL-RA (TUBING) ×2 IMPLANT
WIRE COUGAR XT STRL 190CM (WIRE) ×1 IMPLANT

## 2017-08-14 NOTE — Progress Notes (Signed)
Progress Note  Patient Name: Caitlyn [name] Date of Encounter: 08/14/2017  Primary Cardiologist: Quay Burow, MD   Subjective   72 year old female with a history of coronary artery disease, hypertension, anemia who presented to the emergency room with chest pain. She was started on IV nitroglycerin and IV heparin.  She is to get an echocardiogram today.  She is on aspirin and Plavix.  Troponin level is 2.08.  Inpatient Medications    Scheduled Meds: . aspirin EC  81 mg Oral Daily  . carvedilol  25 mg Oral BID WC  . clopidogrel  75 mg Oral Q breakfast  . feeding supplement  1 Container Oral BID BM  . levothyroxine  88 mcg Oral Daily  . lipase/protease/amylase  36,000 Units Oral Q breakfast  . losartan  50 mg Oral Daily  . rosuvastatin  10 mg Oral QODAY  . venlafaxine XR  150 mg Oral Daily   Continuous Infusions: . heparin 850 Units/hr (08/13/17 1925)  . nitroGLYCERIN 25 mcg/min (08/13/17 2152)   PRN Meds: acetaminophen, methocarbamol, nitroGLYCERIN, ondansetron (ZOFRAN) IV, zolpidem   Vital Signs    Vitals:   08/13/17 2200 08/13/17 2232 08/13/17 2237 08/14/17 0015  BP: (!) 143/78 (!) 143/80  (!) 146/83  Pulse: (!) 109 99  89  Resp: (!) 28 (!) 21  19  Temp:  98.7 F (37.1 C)    TempSrc:  Oral    SpO2: 97% 97%  97%  Weight:   153 lb 4.8 oz (69.5 kg)   Height:   5\' 5"  (1.651 m)     Intake/Output Summary (Last 24 hours) at 08/14/2017 0840 Last data filed at 08/14/2017 0300 Gross per 24 hour  Intake 474.24 ml  Output -  Net 474.24 ml   Filed Weights   08/13/17 2237  Weight: 153 lb 4.8 oz (69.5 kg)    Telemetry    NSR  - Personally Reviewed  ECG    NSR  - Personally Reviewed  Physical Exam   GEN: NAD , complains of chest fullness and pressure.  This sharp stabbing chest pains have not returned.  Neck: No JVD Cardiac:  Regular rate, S1-S2.  No murmurs.Marland Kitchen  Respiratory: Clear to auscultation bilaterally. GI: Soft, nontender, non-distended  MS: No edema;  No deformity. Back has multiple excoriations.  These appear to be consistent with insect bites.  They are very itchy.  This does not appear to be a drug rash. Neuro:  Nonfocal  Psych: Normal affect   Labs    Chemistry Recent Labs  Lab 08/13/17 1639 08/13/17 2048  NA 142  --   K 3.1*  --   CL 106  --   CO2 22  --   GLUCOSE 163*  --   BUN 9  --   CREATININE 0.79  --   CALCIUM 9.1  --   PROT  --  7.1  ALBUMIN  --  3.7  AST  --  25  ALT  --  11*  ALKPHOS  --  62  BILITOT  --  0.9  GFRNONAA >60  --   GFRAA >60  --   ANIONGAP 14  --      Hematology Recent Labs  Lab 08/13/17 1639 08/14/17 0119 08/14/17 0715  WBC 7.8 7.9 6.7  RBC 4.09 3.83* 3.68*  HGB 11.6* 10.9* 10.7*  HCT 35.3* 33.8* 32.6*  MCV 86.3 88.3 88.6  MCH 28.4 28.5 29.1  MCHC 32.9 32.2 32.8  RDW 13.9 14.3 14.3  PLT  222 231 213    Cardiac Enzymes Recent Labs  Lab 08/13/17 2048 08/14/17 0119  TROPONINI 0.93* 2.08*    Recent Labs  Lab 08/13/17 1652  TROPIPOC 0.18*     BNPNo results for input(s): BNP, PROBNP in the last 168 hours.   DDimer  Recent Labs  Lab 08/13/17 2048  DDIMER 0.91*     Radiology    Dg Chest 2 View  Result Date: 08/13/2017 CLINICAL DATA:  Pt arrives to ED via EMS from home with left sided sharp chest pain sunder ribs that radiates to back that began when she got up to use bathroom this a.m. Pain is worse with deep breathing. Pt denies cough. Hx cardiac cath. EXAM: CHEST - 2 VIEW COMPARISON:  06/29/2016 FINDINGS: Cardiac silhouette is normal in size. Left coronary artery stent is stable. No mediastinal or hilar masses. No evidence of adenopathy. Moderate to large hiatal hernia. Clear lungs.  No pleural effusion or pneumothorax. Skeletal structures are demineralized but intact. IMPRESSION: No active cardiopulmonary disease. Electronically Signed   By: Lajean Manes M.D.   On: 08/13/2017 17:21    Cardiac Studies     Patient Profile     72 y.o. female with coronary artery  disease.  She presented yesterday with chest pain and chest fullness.  The stabbing chest pains have resolved.  She continues to have worsening chest fullness and pressure.  Assessment & Plan    1..  Coronary artery disease: The patient has a known history of coronary artery disease.  Has a history of stenting of the mid left anterior descending artery.  Now presents with persistent chest fullness and tightness/pressure.  EKG shows TWI in the anterior leads - new / progressive from ECG from last night  She is eating about half of her breakfast tray.  Of asked her to stop eating for now. Discussed the case with Dr. Angelena Form.  I suspect that she will need to go for heart catheterization today.  Discussed the risks, benefits, options of heart catheter station.  She understands and agrees to proceed.  Ive called [facility] to call in the cath team .   2.  Essential hypertension: Her blood pressure is fairly well controlled.  For questions or updates, please contact [place] Please consult [email or web address] for contact info under Cardiology/STEMI.      Signed, Mertie Moores, MD  08/14/2017, 8:40 AM

## 2017-08-14 NOTE — H&P (View-Only) (Signed)
Progress Note  Patient Name: Caitlyn [name] Date of Encounter: 08/14/2017  Primary Cardiologist: Quay Burow, MD   Subjective   72 year old female with a history of coronary artery disease, hypertension, anemia who presented to the emergency room with chest pain. She was started on IV nitroglycerin and IV heparin.  She is to get an echocardiogram today.  She is on aspirin and Plavix.  Troponin level is 2.08.  Inpatient Medications    Scheduled Meds: . aspirin EC  81 mg Oral Daily  . carvedilol  25 mg Oral BID WC  . clopidogrel  75 mg Oral Q breakfast  . feeding supplement  1 Container Oral BID BM  . levothyroxine  88 mcg Oral Daily  . lipase/protease/amylase  36,000 Units Oral Q breakfast  . losartan  50 mg Oral Daily  . rosuvastatin  10 mg Oral QODAY  . venlafaxine XR  150 mg Oral Daily   Continuous Infusions: . heparin 850 Units/hr (08/13/17 1925)  . nitroGLYCERIN 25 mcg/min (08/13/17 2152)   PRN Meds: acetaminophen, methocarbamol, nitroGLYCERIN, ondansetron (ZOFRAN) IV, zolpidem   Vital Signs    Vitals:   08/13/17 2200 08/13/17 2232 08/13/17 2237 08/14/17 0015  BP: (!) 143/78 (!) 143/80  (!) 146/83  Pulse: (!) 109 99  89  Resp: (!) 28 (!) 21  19  Temp:  98.7 F (37.1 C)    TempSrc:  Oral    SpO2: 97% 97%  97%  Weight:   153 lb 4.8 oz (69.5 kg)   Height:   5\' 5"  (1.651 m)     Intake/Output Summary (Last 24 hours) at 08/14/2017 0840 Last data filed at 08/14/2017 0300 Gross per 24 hour  Intake 474.24 ml  Output -  Net 474.24 ml   Filed Weights   08/13/17 2237  Weight: 153 lb 4.8 oz (69.5 kg)    Telemetry    NSR  - Personally Reviewed  ECG    NSR  - Personally Reviewed  Physical Exam   GEN: NAD , complains of chest fullness and pressure.  This sharp stabbing chest pains have not returned.  Neck: No JVD Cardiac:  Regular rate, S1-S2.  No murmurs.Marland Kitchen  Respiratory: Clear to auscultation bilaterally. GI: Soft, nontender, non-distended  MS: No edema;  No deformity. Back has multiple excoriations.  These appear to be consistent with insect bites.  They are very itchy.  This does not appear to be a drug rash. Neuro:  Nonfocal  Psych: Normal affect   Labs    Chemistry Recent Labs  Lab 08/13/17 1639 08/13/17 2048  NA 142  --   K 3.1*  --   CL 106  --   CO2 22  --   GLUCOSE 163*  --   BUN 9  --   CREATININE 0.79  --   CALCIUM 9.1  --   PROT  --  7.1  ALBUMIN  --  3.7  AST  --  25  ALT  --  11*  ALKPHOS  --  62  BILITOT  --  0.9  GFRNONAA >60  --   GFRAA >60  --   ANIONGAP 14  --      Hematology Recent Labs  Lab 08/13/17 1639 08/14/17 0119 08/14/17 0715  WBC 7.8 7.9 6.7  RBC 4.09 3.83* 3.68*  HGB 11.6* 10.9* 10.7*  HCT 35.3* 33.8* 32.6*  MCV 86.3 88.3 88.6  MCH 28.4 28.5 29.1  MCHC 32.9 32.2 32.8  RDW 13.9 14.3 14.3  PLT  222 231 213    Cardiac Enzymes Recent Labs  Lab 08/13/17 2048 08/14/17 0119  TROPONINI 0.93* 2.08*    Recent Labs  Lab 08/13/17 1652  TROPIPOC 0.18*     BNPNo results for input(s): BNP, PROBNP in the last 168 hours.   DDimer  Recent Labs  Lab 08/13/17 2048  DDIMER 0.91*     Radiology    Dg Chest 2 View  Result Date: 08/13/2017 CLINICAL DATA:  Pt arrives to ED via EMS from home with left sided sharp chest pain sunder ribs that radiates to back that began when she got up to use bathroom this a.m. Pain is worse with deep breathing. Pt denies cough. Hx cardiac cath. EXAM: CHEST - 2 VIEW COMPARISON:  06/29/2016 FINDINGS: Cardiac silhouette is normal in size. Left coronary artery stent is stable. No mediastinal or hilar masses. No evidence of adenopathy. Moderate to large hiatal hernia. Clear lungs.  No pleural effusion or pneumothorax. Skeletal structures are demineralized but intact. IMPRESSION: No active cardiopulmonary disease. Electronically Signed   By: Lajean Manes M.D.   On: 08/13/2017 17:21    Cardiac Studies     Patient Profile     72 y.o. female with coronary artery  disease.  She presented yesterday with chest pain and chest fullness.  The stabbing chest pains have resolved.  She continues to have worsening chest fullness and pressure.  Assessment & Plan    1..  Coronary artery disease: The patient has a known history of coronary artery disease.  Has a history of stenting of the mid left anterior descending artery.  Now presents with persistent chest fullness and tightness/pressure.  EKG shows TWI in the anterior leads - new / progressive from ECG from last night  She is eating about half of her breakfast tray.  Of asked her to stop eating for now. Discussed the case with Dr. Angelena Form.  I suspect that she will need to go for heart catheterization today.  Discussed the risks, benefits, options of heart catheter station.  She understands and agrees to proceed.  Ive called [facility] to call in the cath team .   2.  Essential hypertension: Her blood pressure is fairly well controlled.  For questions or updates, please contact [place] Please consult [email or web address] for contact info under Cardiology/STEMI.      Signed, Mertie Moores, MD  08/14/2017, 8:40 AM

## 2017-08-14 NOTE — Interval H&P Note (Signed)
History and Physical Interval Note:  08/14/2017 9:50 AM  Caitlyn [name]  has presented today for cardiac cath with the diagnosis of NSTEMI. The various methods of treatment have been discussed with the patient and family. After consideration of risks, benefits and other options for treatment, the patient has consented to  Procedure(s): LEFT HEART CATH AND CORONARY ANGIOGRAPHY (N/A) as a surgical intervention .  The patient's history has been reviewed, patient examined, no change in status, stable for surgery.  I have reviewed the patient's chart and labs.  Questions were answered to the patient's satisfaction.    Cath Lab Visit (complete for each Cath Lab visit)  Clinical Evaluation Leading to the Procedure:   ACS: Yes.    Non-ACS:    Anginal Classification: CCS IV  Anti-ischemic medical therapy: Minimal Therapy (1 class of medications)  Non-Invasive Test Results: No non-invasive testing performed  Prior CABG: No previous CABG        Lauree Chandler

## 2017-08-14 NOTE — Progress Notes (Signed)
ANTICOAGULATION CONSULT NOTE - Initial Consult  Pharmacy Consult for heparin Indication: chest pain/ACS  Allergies  Allergen Reactions  . Cortisone Anaphylaxis    Some forms of cortisone  . Demerol [Meperidine] Anaphylaxis  . Monosodium Glutamate Anaphylaxis  . Morphine And Related Anaphylaxis, Nausea And Vomiting and Other (See Comments)  . Prednisone Anaphylaxis    confusion  . Shellfish Allergy Anaphylaxis  . Sulfa Antibiotics Anaphylaxis  . Buprenorphine Hcl Nausea And Vomiting and Hypertension    SHAKING  . Readi-Cat [Barium Sulfate] Diarrhea    Patient does not want to drink Readi-Cat because it gives her diarrhea  . Xanax [Alprazolam] Other (See Comments)    "makes me crazy"  . Benzoic Acid Nausea Only    Patient Measurements: Height: 5\' 5"  (165.1 cm) Weight: 153 lb 4.8 oz (69.5 kg) IBW/kg (Calculated) : 57Actual body weight: 72 kg (patient reported) Ideal body weight: 57 kg Heparin Dosing Weight: 72 kg   Vital Signs: Temp: 98.7 F (37.1 C) (04/05 2232) Temp Source: Oral (04/05 2232) BP: 146/83 (04/06 0015) Pulse Rate: 89 (04/06 0015)  Labs: Recent Labs    08/13/17 1639 08/13/17 2048 08/14/17 0119 08/14/17 0715  HGB 11.6*  --  10.9* 10.7*  HCT 35.3*  --  33.8* 32.6*  PLT 222  --  231 213  LABPROT  --  14.8  --   --   INR  --  1.17  --   --   HEPARINUNFRC  --   --  0.42 0.44  CREATININE 0.79  --   --   --   TROPONINI  --  0.93* 2.08*  --     Estimated Creatinine Clearance: 61.3 mL/min (by C-G formula based on SCr of 0.79 mg/dL).   Medical History: Past Medical History:  Diagnosis Date  . Anemia   . Arthritis   . Bleeding diathesis ([place]) 02/22/2013  . Closed right hip fracture ([place]) 05/28/2016  . Coronary artery disease   . E coli enteritis   . GERD (gastroesophageal reflux disease)   . H/O hiatal hernia   . Head injury   . Hypertension   . Iron deficiency anemia, unspecified 02/22/2013  . Mass of right kidney 08/22/2013   2.5 cm solid  lesion seen on 5/14 CT  . Radicular pain of thoracic region 08/22/2013   Left T-8 started after pushing heavy chest 1/15; intermittent; no focal neuro deficit    Medications:  Medications Prior to Admission  Medication Sig Dispense Refill Last Dose  . ALBUTEROL SULFATE HFA IN Inhale 2 puffs into the lungs every 6 (six) hours as needed (shortness of breath/wheezing).   winter  . amLODipine (NORVASC) 5 MG tablet Take 5 mg by mouth at bedtime.    08/11/2017  . aspirin EC 81 MG EC tablet Take 1 tablet (81 mg total) by mouth daily. 30 tablet 1 08/11/2017  . carvedilol (COREG) 25 MG tablet Take 50 mg by mouth 2 (two) times daily with a meal.    08/11/2017 at 730  . Cholecalciferol (VITAMIN D PO) Take 1 tablet by mouth daily.   08/11/2017  . clopidogrel (PLAVIX) 75 MG tablet Take 1 tablet (75 mg total) by mouth daily with breakfast. 30 tablet 2 08/11/2017  . Cyanocobalamin (VITAMIN B-12 PO) Take 1 tablet by mouth daily.   08/11/2017  . levothyroxine (SYNTHROID, LEVOTHROID) 88 MCG tablet Take 88 mcg by mouth daily before breakfast. Must be NAME BRAND "Synthroid"   08/11/2017 at am  . lipase/protease/amylase (CREON) 36000 UNITS  CPEP capsule Take 76,226-33,354 Units by mouth See admin instructions. Take one capsule (36,000 units) by mouth every morning, may increase to two capsules (72,000 units) as needed for diarrhea   week ago  . losartan (COZAAR) 50 MG tablet Take 1 tablet (50 mg total) by mouth daily. 90 tablet 1 08/11/2017 at am  . methocarbamol (ROBAXIN) 500 MG tablet Take 1 tablet (500 mg total) by mouth every 6 (six) hours as needed for muscle spasms. 30 tablet 3 week ago  . Mometasone Furo-Formoterol Fum (DULERA IN) Inhale 1 puff into the lungs daily as needed (shortness of breath/wheezing).   week ago  . naproxen sodium (ALEVE) 220 MG tablet Take 440 mg by mouth 2 (two) times daily as needed (pain/headache).   few days ago  . nitroGLYCERIN (NITROSTAT) 0.4 MG SL tablet Place 0.4 mg under the tongue every 5  (five) minutes as needed for chest pain.   08/13/2017 at approx 1600  . ROSUVASTATIN CALCIUM PO Take 1 tablet by mouth daily.   08/11/2017  . venlafaxine XR (EFFEXOR-XR) 150 MG 24 hr capsule Take 150-300 mg by mouth daily.    08/11/2017  . feeding supplement (BOOST / RESOURCE BREEZE) LIQD Take 1 Container by mouth 2 (two) times daily between meals. (Patient not taking: Reported on 04/13/2017)  0 Not Taking at Unknown time  . rosuvastatin (CRESTOR) 20 MG tablet Take 0.5 tablets (10 mg total) by mouth every other day. 30 tablet 2     Assessment: 52 YOF here w/ chest pain and elevated troponin. Pharmacy consulted to dose IV heparin for ACS. H/H low but stable. Plt wnl. Per patient, she is not on any anticoagulants at home. Heparin level therapeutic x2.   Goal of Therapy:  Heparin level 0.3-0.7 units/ml Monitor platelets by anticoagulation protocol: Yes   Plan:  Continue heparin 850 units/hr Daily heparin level, CBC Monitor clinical course, s/sx bleeding  Nida Boatman, PharmD PGY1 Acute Care Pharmacy Resident Pager: [phone] 08/14/2017 8:35 AM

## 2017-08-14 NOTE — Progress Notes (Signed)
  Echocardiogram 2D Echocardiogram has been performed.  Merrie Roof F 08/14/2017, 2:37 PM

## 2017-08-14 NOTE — Progress Notes (Signed)
ANTICOAGULATION CONSULT NOTE - Follow Up Consult  Pharmacy Consult for heparin Indication: chest pain/ACS   Labs: Recent Labs    08/13/17 1639 08/13/17 2048 08/14/17 0119  HGB 11.6*  --   --   HCT 35.3*  --   --   PLT 222  --   --   LABPROT  --  14.8  --   INR  --  1.17  --   HEPARINUNFRC  --   --  0.42  CREATININE 0.79  --   --   TROPONINI  --  0.93*  --     Assessment/Plan:  72yo female therapeutic on heparin with initial dosing for CP. Will continue gtt at current rate and confirm stable with additional level.   Wynona Neat, PharmD, BCPS  08/14/2017,2:01 AM

## 2017-08-14 NOTE — Progress Notes (Addendum)
Date and time results received: 08/13/17 2223  Test: Troponin  Critical Value: 0.93  Name of Provider Notified: Dr. Massie Bougie  Orders Received? Or Actions Taken?: No

## 2017-08-15 ENCOUNTER — Other Ambulatory Visit: Payer: Self-pay

## 2017-08-15 DIAGNOSIS — E782 Mixed hyperlipidemia: Secondary | ICD-10-CM

## 2017-08-15 LAB — BASIC METABOLIC PANEL
ANION GAP: 11 (ref 5–15)
BUN: 8 mg/dL (ref 6–20)
CHLORIDE: 106 mmol/L (ref 101–111)
CO2: 24 mmol/L (ref 22–32)
CREATININE: 0.82 mg/dL (ref 0.44–1.00)
Calcium: 8.8 mg/dL — ABNORMAL LOW (ref 8.9–10.3)
GFR calc non Af Amer: >60 mL/min (ref >60)
Glucose, Bld: 154 mg/dL — ABNORMAL HIGH (ref 65–99)
Potassium: 3.6 mmol/L (ref 3.5–5.1)
Sodium: 141 mmol/L (ref 135–145)

## 2017-08-15 LAB — CBC
HCT: 34.2 % — ABNORMAL LOW (ref 36.0–46.0)
HEMOGLOBIN: 10.8 g/dL — AB (ref 12.0–15.0)
MCH: 27.8 pg (ref 26.0–34.0)
MCHC: 31.6 g/dL (ref 30.0–36.0)
MCV: 87.9 fL (ref 78.0–100.0)
Platelets: 195 10*3/uL (ref 150–400)
RBC: 3.89 MIL/uL (ref 3.87–5.11)
RDW: 14 % (ref 11.5–15.5)
WBC: 7 10*3/uL (ref 4.0–10.5)

## 2017-08-15 MED ORDER — ALUM & MAG HYDROXIDE-SIMETH 200-200-20 MG/5ML PO SUSP
30.0000 mL | Freq: Four times a day (QID) | ORAL | Status: DC | PRN
  Administered 2017-08-15: 30 mL via ORAL
  Filled 2017-08-15: qty 30

## 2017-08-15 MED ORDER — FENOFIBRATE 160 MG PO TABS
160.0000 mg | ORAL_TABLET | Freq: Every day | ORAL | Status: DC
  Administered 2017-08-16: 160 mg via ORAL
  Filled 2017-08-15 (×2): qty 1

## 2017-08-15 NOTE — Progress Notes (Signed)
Progress Note  Patient Name: Caitlyn [name] Date of Encounter: 08/15/2017  Primary Cardiologist: Quay Burow, MD   Subjective   Ms. Co is a 72 year old female who was admitted with episodes of chest discomfort.  She had persistent chest fullness yesterday and was taken urgently to the Cath Lab.  She was found to have a 99% stenosis of her proximal LAD.  She was also found to have a moderate to severe lesion just proximal to her previously placed mid LAD stent.  She had stenting of both the sites.  She is feeling much better today.  Her lipid panel reveals a total cholesterol of 150.  HDL is 45.  LDL is 57.  Triglyceride level is 241.  Inpatient Medications    Scheduled Meds: . aspirin EC  81 mg Oral Daily  . carvedilol  25 mg Oral BID WC  . clopidogrel  75 mg Oral Q breakfast  . feeding supplement  1 Container Oral BID BM  . levothyroxine  88 mcg Oral Daily  . lipase/protease/amylase  36,000 Units Oral Q breakfast  . losartan  50 mg Oral Daily  . rosuvastatin  10 mg Oral QODAY  . sodium chloride flush  3 mL Intravenous Q12H  . venlafaxine XR  150 mg Oral Daily   Continuous Infusions: . sodium chloride     PRN Meds: sodium chloride, acetaminophen, diphenhydrAMINE, methocarbamol, nitroGLYCERIN, ondansetron (ZOFRAN) IV, sodium chloride flush, zolpidem   Vital Signs    Vitals:   08/15/17 0800 08/15/17 0820 08/15/17 0825 08/15/17 0831  BP:   (!) 118/103   Pulse: 81   78  Resp: 18   (!) 31  Temp:  98.3 F (36.8 C)    TempSrc:  Oral    SpO2: 94%   96%  Weight:      Height:        Intake/Output Summary (Last 24 hours) at 08/15/2017 0838 Last data filed at 08/14/2017 2100 Gross per 24 hour  Intake 1032.5 ml  Output 700 ml  Net 332.5 ml   Filed Weights   08/13/17 2237 08/15/17 0700  Weight: 153 lb 4.8 oz (69.5 kg) 155 lb 3.3 oz (70.4 kg)    Telemetry    NSR , occasional ectopic atrial beats  - Personally Reviewed  ECG     NSR  - Personally  Reviewed  Physical Exam   GEN: No acute distress.   Neck: No JVD Cardiac: RRR, no murmurs, rubs, or gallops.  Respiratory: Clear to auscultation bilaterally. GI: Soft, nontender, non-distended  MS:  Right radial cath site has a very small area of ecchymosis and a very small hematoma.  Pulses are intact. Neuro:  Nonfocal  Psych: Normal affect   Labs    Chemistry Recent Labs  Lab 08/13/17 1639 08/13/17 2048 08/14/17 0715 08/15/17 0705  NA 142  --  140 141  K 3.1*  --  3.0* 3.6  CL 106  --  105 106  CO2 22  --  23 24  GLUCOSE 163*  --  152* 154*  BUN 9  --  12 8  CREATININE 0.79  --  0.90 0.82  CALCIUM 9.1  --  8.3* 8.8*  PROT  --  7.1  --   --   ALBUMIN  --  3.7  --   --   AST  --  25  --   --   ALT  --  11*  --   --   ALKPHOS  --  62  --   --   BILITOT  --  0.9  --   --   GFRNONAA >60  --  >60 >60  GFRAA >60  --  >60 >60  ANIONGAP 14  --  12 11     Hematology Recent Labs  Lab 08/14/17 0119 08/14/17 0715 08/15/17 0705  WBC 7.9 6.7 7.0  RBC 3.83* 3.68* 3.89  HGB 10.9* 10.7* 10.8*  HCT 33.8* 32.6* 34.2*  MCV 88.3 88.6 87.9  MCH 28.5 29.1 27.8  MCHC 32.2 32.8 31.6  RDW 14.3 14.3 14.0  PLT 231 213 195    Cardiac Enzymes Recent Labs  Lab 08/13/17 2048 08/14/17 0119 08/14/17 0715  TROPONINI 0.93* 2.08* 1.77*    Recent Labs  Lab 08/13/17 1652  TROPIPOC 0.18*     BNPNo results for input(s): BNP, PROBNP in the last 168 hours.   DDimer  Recent Labs  Lab 08/13/17 2048  DDIMER 0.91*     Radiology    Dg Chest 2 View  Result Date: 08/13/2017 CLINICAL DATA:  Pt arrives to ED via EMS from home with left sided sharp chest pain sunder ribs that radiates to back that began when she got up to use bathroom this a.m. Pain is worse with deep breathing. Pt denies cough. Hx cardiac cath. EXAM: CHEST - 2 VIEW COMPARISON:  06/29/2016 FINDINGS: Cardiac silhouette is normal in size. Left coronary artery stent is stable. No mediastinal or hilar masses. No evidence  of adenopathy. Moderate to large hiatal hernia. Clear lungs.  No pleural effusion or pneumothorax. Skeletal structures are demineralized but intact. IMPRESSION: No active cardiopulmonary disease. Electronically Signed   By: Lajean Manes M.D.   On: 08/13/2017 17:21    Cardiac Studies     Patient Profile     72 y.o. female history of coronary artery disease and hyperlipidemia.  Assessment & Plan    1.  Coronary artery disease:  NSTEMI  This is a make is doing much better following PCI of her proximal and mid LAD.  She is no longer having any chest fullness.  Her lipid levels reveal that her triglyceride levels are fairly high.  We will add fenofibrate.  Transfer out of the intensive care unit to telemetry.  We will have her ambulate today and anticipate discharge tomorrow.  2.  Hyperlipidemia: She is on Crestor 10 mg a day.  Her LDL looks good.  Triglyceride level of 271.  Add fenofibrate 145 mg a day.  For questions or updates, please contact [place] Please consult [email or web address] for contact info under Cardiology/STEMI.      Signed, Mertie Moores, MD  08/15/2017, 8:38 AM

## 2017-08-15 NOTE — Plan of Care (Signed)
2D echo completed post cath as ordered.  Pt voiding without difficulty utilizing a Pure-wick external catheter.  Pt without any c/o pain or chest heaviness s/p DES placement x 2.  Advancing Pt's activity to OOB and ambulating s/p PCI without complications.  Rt radial TR band deflated and removed without complications, vascular access site @ Level 0.

## 2017-08-16 ENCOUNTER — Encounter (HOSPITAL_COMMUNITY)
Admission: EM | Disposition: A | Discharge status: Home / Self Care | Payer: Self-pay | Source: Home / Self Care | Attending: Cardiovascular Disease

## 2017-08-16 ENCOUNTER — Encounter (HOSPITAL_COMMUNITY): Payer: Self-pay | Admitting: Cardiovascular Disease

## 2017-08-16 LAB — CBC
HEMATOCRIT: 30 % — AB (ref 36.0–46.0)
Hemoglobin: 9.6 g/dL — ABNORMAL LOW (ref 12.0–15.0)
MCH: 28.2 pg (ref 26.0–34.0)
MCHC: 32 g/dL (ref 30.0–36.0)
MCV: 88.2 fL (ref 78.0–100.0)
Platelets: 176 10*3/uL (ref 150–400)
RBC: 3.4 MIL/uL — ABNORMAL LOW (ref 3.87–5.11)
RDW: 13.9 % (ref 11.5–15.5)
WBC: 6.1 10*3/uL (ref 4.0–10.5)

## 2017-08-16 LAB — BASIC METABOLIC PANEL
Anion gap: 10 (ref 5–15)
BUN: 17 mg/dL (ref 6–20)
CALCIUM: 8.4 mg/dL — AB (ref 8.9–10.3)
CHLORIDE: 104 mmol/L (ref 101–111)
CO2: 25 mmol/L (ref 22–32)
CREATININE: 0.87 mg/dL (ref 0.44–1.00)
GFR calc Af Amer: >60 mL/min (ref >60)
GFR calc non Af Amer: >60 mL/min (ref >60)
Glucose, Bld: 124 mg/dL — ABNORMAL HIGH (ref 65–99)
Potassium: 3.4 mmol/L — ABNORMAL LOW (ref 3.5–5.1)
Sodium: 139 mmol/L (ref 135–145)

## 2017-08-16 LAB — POCT ACTIVATED CLOTTING TIME: Activated Clotting Time: 406 seconds

## 2017-08-16 LAB — PROTIME-INR
INR: 1.04
Prothrombin Time: 13.5 seconds (ref 11.4–15.2)

## 2017-08-16 SURGERY — LEFT HEART CATH AND CORONARY ANGIOGRAPHY
Anesthesia: LOCAL

## 2017-08-16 MED ORDER — CARVEDILOL 25 MG PO TABS: 25.0000 mg | ORAL_TABLET | Freq: Two times a day (BID) | ORAL | 6 refills | Status: DC

## 2017-08-16 MED ORDER — POTASSIUM CHLORIDE CRYS ER 20 MEQ PO TBCR
40.0000 meq | EXTENDED_RELEASE_TABLET | Freq: Once | ORAL | Status: AC
  Administered 2017-08-16: 40 meq via ORAL
  Filled 2017-08-16: qty 2

## 2017-08-16 MED ORDER — FENOFIBRATE 160 MG PO TABS: 160.0000 mg | ORAL_TABLET | Freq: Every day | ORAL | 6 refills | Status: DC

## 2017-08-16 MED ORDER — ASPIRIN 81 MG PO TBEC: 81.0000 mg | DELAYED_RELEASE_TABLET | Freq: Every day | ORAL | 11 refills | Status: DC

## 2017-08-16 MED ORDER — POTASSIUM CHLORIDE CRYS ER 20 MEQ PO TBCR
20.0000 meq | EXTENDED_RELEASE_TABLET | Freq: Once | ORAL | Status: AC
  Administered 2017-08-16: 20 meq via ORAL
  Filled 2017-08-16: qty 1

## 2017-08-16 NOTE — Progress Notes (Addendum)
The patient has been seen in conjunction with Vin Bhagat, PAC. All aspects of care have been considered and discussed. The patient has been personally interviewed, examined, and all clinical data has been reviewed.   Please see the discharge summary and my comments.  Progress Note  Patient Name: Caitlyn [name] Date of Encounter: 08/16/2017  Primary Cardiologist: Quay Burow, MD   Subjective   No chest pain or dyspnea.   Inpatient Medications    Scheduled Meds: . aspirin EC  81 mg Oral Daily  . carvedilol  25 mg Oral BID WC  . clopidogrel  75 mg Oral Q breakfast  . feeding supplement  1 Container Oral BID BM  . fenofibrate  160 mg Oral Daily  . levothyroxine  88 mcg Oral Daily  . lipase/protease/amylase  36,000 Units Oral Q breakfast  . losartan  50 mg Oral Daily  . rosuvastatin  10 mg Oral QODAY  . sodium chloride flush  3 mL Intravenous Q12H  . venlafaxine XR  150 mg Oral Daily   Continuous Infusions: . sodium chloride     PRN Meds: sodium chloride, acetaminophen, alum & mag hydroxide-simeth, diphenhydrAMINE, methocarbamol, nitroGLYCERIN, ondansetron (ZOFRAN) IV, sodium chloride flush, zolpidem   Vital Signs    Vitals:   08/15/17 1912 08/16/17 0001 08/16/17 0543 08/16/17 0900  BP: 131/67 132/60 (!) 143/85 140/69  Pulse: 82 88 79 89  Resp: 18 18 18 16   Temp: 98.6 F (37 C) 98.2 F (36.8 C) 98.3 F (36.8 C) 98.2 F (36.8 C)  TempSrc: Oral Oral Oral Oral  SpO2: 96% 98% 98% 99%  Weight:   156 lb 8 oz (71 kg)   Height:        Intake/Output Summary (Last 24 hours) at 08/16/2017 1102 Last data filed at 08/16/2017 0855 Gross per 24 hour  Intake 600 ml  Output 300 ml  Net 300 ml   Filed Weights   08/15/17 0700 08/15/17 1319 08/16/17 0543  Weight: 155 lb 3.3 oz (70.4 kg) 156 lb 1.6 oz (70.8 kg) 156 lb 8 oz (71 kg)    Telemetry    SR with PVCs - Personally Reviewed  ECG    N/A  Physical Exam   GEN: No acute distress.   Neck: No JVD Cardiac: RRR, no  murmurs, rubs, or gallops. R radial mild ecchymosis.  Respiratory: Clear to auscultation bilaterally. GI: Soft, nontender, non-distended  MS: No edema; No deformity. Neuro:  Nonfocal  Psych: Normal affect   Labs    Chemistry Recent Labs  Lab 08/13/17 2048 08/14/17 0715 08/15/17 0705 08/16/17 0445  NA  --  140 141 139  K  --  3.0* 3.6 3.4*  CL  --  105 106 104  CO2  --  23 24 25   GLUCOSE  --  152* 154* 124*  BUN  --  12 8 17   CREATININE  --  0.90 0.82 0.87  CALCIUM  --  8.3* 8.8* 8.4*  PROT 7.1  --   --   --   ALBUMIN 3.7  --   --   --   AST 25  --   --   --   ALT 11*  --   --   --   ALKPHOS 62  --   --   --   BILITOT 0.9  --   --   --   GFRNONAA  --  >60 >60 >60  GFRAA  --  >60 >60 >60  ANIONGAP  --  12 11 10      Hematology Recent Labs  Lab 08/14/17 0715 08/15/17 0705 08/16/17 0445  WBC 6.7 7.0 6.1  RBC 3.68* 3.89 3.40*  HGB 10.7* 10.8* 9.6*  HCT 32.6* 34.2* 30.0*  MCV 88.6 87.9 88.2  MCH 29.1 27.8 28.2  MCHC 32.8 31.6 32.0  RDW 14.3 14.0 13.9  PLT 213 195 176    Cardiac Enzymes Recent Labs  Lab 08/13/17 2048 08/14/17 0119 08/14/17 0715  TROPONINI 0.93* 2.08* 1.77*    Recent Labs  Lab 08/13/17 1652  TROPIPOC 0.18*     BNPNo results for input(s): BNP, PROBNP in the last 168 hours.   DDimer  Recent Labs  Lab 08/13/17 2048  DDIMER 0.91*     Radiology    No results found.  Cardiac Studies   Echo 08/14/17 Study Conclusions  - Left ventricle: The cavity size was normal. Wall thickness was   normal. Systolic function was moderately reduced. The estimated   ejection fraction was in the range of 35% to 40%. There is   akinesis of the apical myocardium. Doppler parameters are   consistent with abnormal left ventricular relaxation (grade 1   diastolic dysfunction). - Left atrium: The atrium was mildly dilated.  Impressions:  - Akinesis of the entire apex with overall moderate LV dysfunction;   mild diastolic dysfunction; mild LAE;  no LV apical thrombus noted   using definity.  CORONARY STENT INTERVENTION   08/14/17  LEFT HEART CATH AND CORONARY ANGIOGRAPHY  Conclusion     Prox RCA lesion is 20% stenosed.  Mid RCA lesion is 20% stenosed.  Ost 2nd Mrg to 2nd Mrg lesion is 30% stenosed.  Ost 3rd Mrg to 3rd Mrg lesion is 30% stenosed.  Mid Cx to Dist Cx lesion is 20% stenosed.  Previously placed Mid LAD stent (unknown type) is widely patent.  Prox LAD to Mid LAD lesion is 70% stenosed.  Ost LAD to Prox LAD lesion is 99% stenosed.  Ost 1st Diag to 1st Diag lesion is 50% stenosed.  Ost 2nd Diag lesion is 90% stenosed.  A drug-eluting stent was successfully placed using a STENT SYNERGY DES 2.50X8.  Post intervention, there is a 0% residual stenosis.  A drug-eluting stent was successfully placed using a STENT SYNERGY DES 3X12.  Post intervention, there is a 0% residual stenosis.  There is moderate left ventricular systolic dysfunction.  LV end diastolic pressure is mildly elevated.  The left ventricular ejection fraction is 35-45% by visual estimate.  There is no mitral valve regurgitation.   1. NSTEMI secondary to severe stenosis in the proximal LAD. The proximal LAD stenosis was a hazy, ulcerated appearing 99% lesion. There was an eccentric 70% stenosis in the mid LAD just before the old stent.  2. Mild non-obstructive disease in the first Diagonal, first OM, second OM, proximal RCA 3. Successful PTCA/DES x 1 mid LAD 4. Successful PTCA/DES x 1 proximal LAD 5. Moderate segmental LV systolic dysfunction  Recommendations: Will continue DAPT with ASA and Plavix. Continue beta blocker and statin.    Diagnostic Diagram       Post-Intervention Diagram           Patient Profile     Caitlyn [name] is a 72 y.o. female with a history of CAD, HTN, anemia who is presenting to the ED with c/o chest pain and ruled in for NSTEMI.   Assessment & Plan    1. NSTEMI - Peak of troponin 2.08.  Initially treated with IV  heparin and IV nitro.Cath showed 99% stenosis of her proximal LAD and an eccentric 70% stenosis in the mid LAD just before the old stent S/p PTCA & DES to both place. No recurrent chest heaviness.  - Continue ASA, Plavix, BB and statin.   2. Acute combined CHF - Echo showed LVEF of 35-40% with akinesis of the entire apex. Mild LAE. No thrombus seen.  - Continue Coreg 25mg  BID and Losartan 50mg  qd. Net I & O +1.3L. She is euvolemic by exam. She did not required any lasix while here. ? Need of Life Vest.   3. HLD with hypertriglyceridemia - 08/14/2017: Cholesterol 150; HDL 45; LDL Cholesterol 57; Triglycerides 241; VLDL 48  - LDL at goal of less than 70. Continue Crestor 20mg  every other day. Added fenofibrate 145 mg a day this admission. Repeat lab in few weeks.   4. Dizziness - While ambulating with cardiac rehab this morning. She is not orthostatic by vitals today.    5. HTN - BP stable on current medications.   6. Hypokalemia - Will give Kdur 62meq x1.   For questions or updates, please contact [place] Please consult [email or web address] for contact info under Cardiology/STEMI.      SignedLeanor Kail, PA  08/16/2017, 11:02 AM

## 2017-08-16 NOTE — Discharge Summary (Addendum)
The patient has been seen in conjunction with Vin Bhagat, PAC. All aspects of care have been considered and discussed. The patient has been personally interviewed, examined, and all clinical data has been reviewed.   Left anterior descending coronary artery related non-ST elevation myocardial infarction treated with stenting of 2 sites in the LAD.  Radial cath site is unremarkable.  Potassium is 3.4 today.  Supplemental potassium, 20 mEq is given prior to discharge.  Uncomplicated course following procedure.  Discharge today.  Needs high intensity statin therapy.  Aggressive blood pressure control and observation of glycemic control.  Patient is ready for discharge today with transition of care follow-up in 7-10 days.  Discharge Summary    Patient ID: Caitlyn [name],  MRN: [id number], DOB/AGE: 16-Jun-1945 72 y.o.  Admit date: 08/13/2017 Discharge date: 08/16/2017  Primary Care Provider: Josetta Huddle Primary Cardiologist: Dr.Berry  Discharge Diagnoses    Active Problems:   NSTEMI (non-ST elevated myocardial infarction) ([place])   Acute combined CHF   HLD with hypertriglyceridemia   HTN   Dizziness   Hypokalemia   Allergies Allergies  Allergen Reactions  . Cortisone Anaphylaxis    Some forms of cortisone  . Demerol [Meperidine] Anaphylaxis  . Monosodium Glutamate Anaphylaxis  . Morphine And Related Anaphylaxis, Nausea And Vomiting and Other (See Comments)  . Prednisone Anaphylaxis    confusion  . Shellfish Allergy Anaphylaxis  . Sulfa Antibiotics Anaphylaxis  . Buprenorphine Hcl Nausea And Vomiting and Hypertension    SHAKING  . Readi-Cat [Barium Sulfate] Diarrhea    Patient does not want to drink Readi-Cat because it gives her diarrhea  . Xanax [Alprazolam] Other (See Comments)    "makes me crazy"  . Benzoic Acid Nausea Only    Diagnostic Studies/Procedures  Echo 08/14/17 Study Conclusions  - Left ventricle: The cavity size was normal. Wall thickness  was normal. Systolic function was moderately reduced. The estimated ejection fraction was in the range of 35% to 40%. There is akinesis of the apical myocardium. Doppler parameters are consistent with abnormal left ventricular relaxation (grade 1 diastolic dysfunction). - Left atrium: The atrium was mildly dilated.  Impressions:  - Akinesis of the entire apex with overall moderate LV dysfunction; mild diastolic dysfunction; mild LAE; no LV apical thrombus noted using definity.  CORONARY STENT INTERVENTION   08/14/17  LEFT HEART CATH AND CORONARY ANGIOGRAPHY  Conclusion     Prox RCA lesion is 20% stenosed.  Mid RCA lesion is 20% stenosed.  Ost 2nd Mrg to 2nd Mrg lesion is 30% stenosed.  Ost 3rd Mrg to 3rd Mrg lesion is 30% stenosed.  Mid Cx to Dist Cx lesion is 20% stenosed.  Previously placed Mid LAD stent (unknown type) is widely patent.  Prox LAD to Mid LAD lesion is 70% stenosed.  Ost LAD to Prox LAD lesion is 99% stenosed.  Ost 1st Diag to 1st Diag lesion is 50% stenosed.  Ost 2nd Diag lesion is 90% stenosed.  A drug-eluting stent was successfully placed using a STENT SYNERGY DES 2.50X8.  Post intervention, there is a 0% residual stenosis.  A drug-eluting stent was successfully placed using a STENT SYNERGY DES 3X12.  Post intervention, there is a 0% residual stenosis.  There is moderate left ventricular systolic dysfunction.  LV end diastolic pressure is mildly elevated.  The left ventricular ejection fraction is 35-45% by visual estimate.  There is no mitral valve regurgitation.  1. NSTEMI secondary to severe stenosis in the proximal LAD. The proximal LAD  stenosis was a hazy, ulcerated appearing 99% lesion. There was an eccentric 70% stenosis in the mid LAD just before the old stent.  2. Mild non-obstructive disease in the first Diagonal, first OM, second OM, proximal RCA 3. Successful PTCA/DES x 1 mid LAD 4. Successful PTCA/DES x 1  proximal LAD 5. Moderate segmental LV systolic dysfunction  Recommendations: Will continue DAPT with ASA and Plavix. Continue beta blocker and statin.    Diagnostic Diagram       Post-Intervention Diagram           History of Present Illness      Caitlyn Amickis a 72 y.o.femalewith a history of CAD, HTN, anemia who is presenting to the ED with c/o chest pain and ruled in for NSTEMI.   She has known CAD. Last cardiac cath 06/01/16 with severe stenosis in the mid LAD treated with a Synergy drug eluting stent. She has been on ASA and Plavix.  She had the onset of left sided chest pain radiating to her back. She has dyspnea. No LE edema. She has had some confusion over the past two day. Started IV NTG and her chest pain is improved.  EKG shows sinus rhythm with subtle ST depression. Troponin 0.18.  Hospital Course     Consultants: None  1. NSTEMI - Peak of troponin 2.08. Initially treated with IV heparin and IV nitro.Cath showed 99% stenosis of her proximal LAD and an eccentric 70% stenosis in the mid LAD just before the old stent S/p PTCA & DES to both place. No recurrent chest heaviness.  - Continue ASA, Plavix, BB and statin.   2. Acute combined CHF - Echo showed LVEF of 35-40% with akinesis of the entire apex. Mild LAE. No thrombus seen.  - Continue Coreg 25mg  BID and Losartan 50mg  qd. Net I & O +1.3L. She is euvolemic by exam. She did not required any lasix while here.   3. HLD with hypertriglyceridemia - 08/14/2017: Cholesterol 150; HDL 45; LDL Cholesterol 57; Triglycerides 241; VLDL 48  - LDL at goal of less than 70. Continue Crestor 20mg  every other day (consider increasing dose as outpatient). Added fenofibrate 145 mg a day this admission. Repeat lab in few weeks.   4. Dizziness - While ambulating with cardiac rehab this morning. She is not orthostatic by vitals today.   5. HTN - BP stable on current medications.   6. Hypokalemia - given supplement. BMET  as outpatient.   The patient has been seen by Dr. Tamala Julian today and deemed ready for discharge home. All follow-up appointments have been scheduled. Discharge medications are listed below.    Discharge Vitals Blood pressure 131/76, pulse 79, temperature 98.1 F (36.7 C), temperature source Oral, resp. rate 16, height 5\' 5"  (1.651 m), weight 156 lb 8 oz (71 kg), SpO2 99 %.  Filed Weights   08/15/17 0700 08/15/17 1319 08/16/17 0543  Weight: 155 lb 3.3 oz (70.4 kg) 156 lb 1.6 oz (70.8 kg) 156 lb 8 oz (71 kg)    Labs & Radiologic Studies     CBC Recent Labs    08/15/17 0705 08/16/17 0445  WBC 7.0 6.1  HGB 10.8* 9.6*  HCT 34.2* 30.0*  MCV 87.9 88.2  PLT 195 621   Basic Metabolic Panel Recent Labs    08/13/17 2048  08/15/17 0705 08/16/17 0445  NA  --    < > 141 139  K  --    < > 3.6 3.4*  CL  --    < >  106 104  CO2  --    < > 24 25  GLUCOSE  --    < > 154* 124*  BUN  --    < > 8 17  CREATININE  --    < > 0.82 0.87  CALCIUM  --    < > 8.8* 8.4*  MG 1.8  --   --   --    < > = values in this interval not displayed.   Liver Function Tests Recent Labs    08/13/17 2048  AST 25  ALT 11*  ALKPHOS 62  BILITOT 0.9  PROT 7.1  ALBUMIN 3.7   No results for input(s): LIPASE, AMYLASE in the last 72 hours. Cardiac Enzymes Recent Labs    08/13/17 2048 08/14/17 0119 08/14/17 0715  TROPONINI 0.93* 2.08* 1.77*   BNP Invalid input(s): POCBNP D-Dimer Recent Labs    08/13/17 2048  DDIMER 0.91*   Hemoglobin A1C Recent Labs    08/13/17 2048  HGBA1C 6.1*   Fasting Lipid Panel Recent Labs    08/14/17 0119  CHOL 150  HDL 45  LDLCALC 57  TRIG 241*  CHOLHDL 3.3   Thyroid Function Tests Recent Labs    08/13/17 2048  TSH 1.442    Dg Chest 2 View  Result Date: 08/13/2017 CLINICAL DATA:  Pt arrives to ED via EMS from home with left sided sharp chest pain sunder ribs that radiates to back that began when she got up to use bathroom this a.m. Pain is worse with deep  breathing. Pt denies cough. Hx cardiac cath. EXAM: CHEST - 2 VIEW COMPARISON:  06/29/2016 FINDINGS: Cardiac silhouette is normal in size. Left coronary artery stent is stable. No mediastinal or hilar masses. No evidence of adenopathy. Moderate to large hiatal hernia. Clear lungs.  No pleural effusion or pneumothorax. Skeletal structures are demineralized but intact. IMPRESSION: No active cardiopulmonary disease. Electronically Signed   By: Lajean Manes M.D.   On: 08/13/2017 17:21    Disposition   Pt is being discharged home today in good condition.  Follow-up Plans & Appointments    Follow-up Information    Erlene Quan, [place]. Go on 08/24/2017.   Specialties:  Cardiology, Radiology Why:  @8 :30am for TOC follow up Contact information: [address] [place] [address] [phone]          Discharge Instructions    Amb Referral to Cardiac Rehabilitation   Complete by:  As directed    Diagnosis:   Coronary Stents PTCA NSTEMI     Diet - low sodium heart healthy   Complete by:  As directed    Discharge instructions   Complete by:  As directed    NO HEAVY LIFTING (>10lbs) X 2 WEEKS. NO SEXUAL ACTIVITY X 2 WEEKS. NO DRIVING X 1 WEEK. NO SOAKING BATHS, HOT TUBS, POOLS, ETC., X 7 DAYS.   Increase activity slowly   Complete by:  As directed       Discharge Medications   Allergies as of 08/16/2017      Reactions   Cortisone Anaphylaxis   Some forms of cortisone   Demerol [meperidine] Anaphylaxis   Monosodium Glutamate Anaphylaxis   Morphine And Related Anaphylaxis, Nausea And Vomiting, Other (See Comments)   Prednisone Anaphylaxis   confusion   Shellfish Allergy Anaphylaxis   Sulfa Antibiotics Anaphylaxis   Buprenorphine Hcl Nausea And Vomiting, Hypertension   SHAKING   Readi-cat [barium Sulfate] Diarrhea   Patient does not want to  drink Readi-Cat because it gives her diarrhea   Xanax [alprazolam] Other (See Comments)   "makes me crazy"   Benzoic  Acid Nausea Only      Medication List    STOP taking these medications   amLODipine 5 MG tablet Commonly known as:  NORVASC   naproxen sodium 220 MG tablet Commonly known as:  ALEVE     TAKE these medications   ALBUTEROL SULFATE HFA IN Inhale 2 puffs into the lungs every 6 (six) hours as needed (shortness of breath/wheezing).   aspirin 81 MG EC tablet Take 1 tablet (81 mg total) by mouth daily.   carvedilol 25 MG tablet Commonly known as:  COREG Take 1 tablet (25 mg total) by mouth 2 (two) times daily with a meal. What changed:  how much to take   clopidogrel 75 MG tablet Commonly known as:  PLAVIX Take 1 tablet (75 mg total) by mouth daily with breakfast.   CREON 36000 UNITS Cpep capsule Generic drug:  lipase/protease/amylase Take 36,000-72,000 Units by mouth See admin instructions. Take one capsule (36,000 units) by mouth every morning, may increase to two capsules (72,000 units) as needed for diarrhea   DULERA IN Inhale 1 puff into the lungs daily as needed (shortness of breath/wheezing).   feeding supplement Liqd Take 1 Container by mouth 2 (two) times daily between meals.   fenofibrate 160 MG tablet Take 1 tablet (160 mg total) by mouth daily. Start taking on:  08/17/2017   levothyroxine 88 MCG tablet Commonly known as:  SYNTHROID, LEVOTHROID Take 88 mcg by mouth daily before breakfast. Must be NAME BRAND "Synthroid"   losartan 50 MG tablet Commonly known as:  COZAAR Take 1 tablet (50 mg total) by mouth daily.   methocarbamol 500 MG tablet Commonly known as:  ROBAXIN Take 1 tablet (500 mg total) by mouth every 6 (six) hours as needed for muscle spasms.   nitroGLYCERIN 0.4 MG SL tablet Commonly known as:  NITROSTAT Place 0.4 mg under the tongue every 5 (five) minutes as needed for chest pain.   rosuvastatin 20 MG tablet Commonly known as:  CRESTOR Take 0.5 tablets (10 mg total) by mouth every other day.   venlafaxine XR 150 MG 24 hr capsule Commonly  known as:  EFFEXOR-XR Take 150-300 mg by mouth daily.   VITAMIN B-12 PO Take 1 tablet by mouth daily.   VITAMIN D PO Take 1 tablet by mouth daily.        Aspirin prescribed at discharge?  Yes High Intensity Statin Prescribed? (Lipitor 40-80mg  or Crestor 20-40mg ): Low dose due intolerance. LDL at goal on current dose Beta Blocker Prescribed? Yes For EF 45% or less, Was ACEI/ARB Prescribed? Yes ADP Receptor Inhibitor Prescribed? (i.e. Plavix etc.-Includes Medically Managed Patients): Yes For EF <40%, Aldosterone Inhibitor Prescribed? N/A Was EF assessed during THIS hospitalization? Yes Was Cardiac Rehab II ordered? (Included Medically managed Patients): Yes   Outstanding Labs/Studies  LIpid panel in 6 weeks.   Duration of Discharge Encounter   Greater than 30 minutes including physician time.  Signed, Bhavinkumar Bhagat PA-C 08/16/2017, 1:51 PM

## 2017-08-16 NOTE — Progress Notes (Signed)
Patient discharge to home. Discharge instructions and follow up appointments discussed with patient, verbalized understanding.

## 2017-08-16 NOTE — Progress Notes (Signed)
CARDIAC REHAB PHASE I   PRE:  Rate/Rhythm: 89 SR    BP: sitting 122/78    SaO2: 95 RA  MODE:  Ambulation: 280 ft   POST:  Rate/Rhythm: 99 SR    BP: sitting 133/103, recheck 123/106     SaO2: 99 RA  Pt to sink then ambulated. Pt felt dizzy in hall. Had to stop and rest then return to room. DBP elevated. Pt feels that her dizziness/lightheadedness is a result of getting moving too quickly. Sts she still feels "off" in the recliner and sts that she probably will feel this way all day. She likes to keep to her schedule. Ed completed including MI, stent, Plavix, restrictions, diet, ex, NTG, and CRPII. Will send referral to [facility].  [place], ACSM 08/16/2017 9:27 AM

## 2017-08-18 ENCOUNTER — Telehealth (HOSPITAL_COMMUNITY): Payer: Self-pay

## 2017-08-18 NOTE — Telephone Encounter (Signed)
Patients insurance is active and benefits verified through [facility] - $15.00 co-pay, no deductible, out of pocket amount of $3,400/$0.00 has been met, no co-insurance, and no pre-authorization is required. Reference [id number]  Will contact patient to see if interested in the CR program. If interested, patient will need to complete follow up appt. Once completed, patient will be contacted for scheduling upon review by the RN Navigator.

## 2017-08-19 ENCOUNTER — Telehealth (HOSPITAL_COMMUNITY): Payer: Self-pay

## 2017-08-19 ENCOUNTER — Telehealth: Payer: Self-pay | Admitting: Cardiology

## 2017-08-19 NOTE — Telephone Encounter (Signed)
Attempted to call patient to see if she is interested in the Cardiac Rehab Program - lm on vm °

## 2017-08-19 NOTE — Telephone Encounter (Signed)
Pt called answering service this morning with concerns that she has phlebitis at the IV and lab stick sites from her recent hospital stay. She states that this is not in regards to her right radial cath site which looks great. She was recently discharged from [facility] on 08/16/17 after receiving two stents. She denies chest pain or associated symptoms and is simply concerned with her bruised and reddened arms. She denies fevers and chills. She states that her arms are painful and somewhat swollen. She has taken Tylenol before calling the answering service. I explained that it is difficult to assess her arms over the phone without being able to see or feel them. I ensured her that taking Tylenol was a good thing to do. I also recommended warm and cool compresses to the painful areas.   She additionally had concerns with right neck discomfort, however states that she has been tossing and turning throughout the night and has not slept well.   She was advised to call her PCP or our office during business hours if the pain or swelling worsened or she feels that the sites need to be physically seen. She was also advised to come to the ED if she began to experience chest pain or other ACS symptoms.    Kathyrn Drown NP-C [place] Pager: [phone]

## 2017-08-23 DIAGNOSIS — I10 Essential (primary) hypertension: Secondary | ICD-10-CM | POA: Diagnosis not present

## 2017-08-23 DIAGNOSIS — I214 Non-ST elevation (NSTEMI) myocardial infarction: Secondary | ICD-10-CM | POA: Diagnosis not present

## 2017-08-24 ENCOUNTER — Ambulatory Visit (INDEPENDENT_AMBULATORY_CARE_PROVIDER_SITE_OTHER): Payer: PPO | Admitting: Cardiology

## 2017-08-24 ENCOUNTER — Encounter: Payer: Self-pay | Admitting: Cardiology

## 2017-08-24 VITALS — BP 124/70 | HR 70 | Ht 65.0 in | Wt 157.0 lb

## 2017-08-24 DIAGNOSIS — I214 Non-ST elevation (NSTEMI) myocardial infarction: Secondary | ICD-10-CM | POA: Diagnosis not present

## 2017-08-24 DIAGNOSIS — I255 Ischemic cardiomyopathy: Secondary | ICD-10-CM | POA: Insufficient documentation

## 2017-08-24 DIAGNOSIS — I10 Essential (primary) hypertension: Secondary | ICD-10-CM

## 2017-08-24 DIAGNOSIS — I2510 Atherosclerotic heart disease of native coronary artery without angina pectoris: Secondary | ICD-10-CM | POA: Diagnosis not present

## 2017-08-24 DIAGNOSIS — E785 Hyperlipidemia, unspecified: Secondary | ICD-10-CM

## 2017-08-24 DIAGNOSIS — Z9861 Coronary angioplasty status: Secondary | ICD-10-CM

## 2017-08-24 NOTE — Assessment & Plan Note (Signed)
NSTEMI Jan 2018 and April 09-2017 Seen today post NSTEMI

## 2017-08-24 NOTE — Progress Notes (Addendum)
08/24/2017 Caitlyn [name]   03-01-1946  [id number]  Primary Physician Josetta Huddle, MD Primary Cardiologist: Dr [name]  HPI:  Pleasant 72 y/o female followed by Dr [name] for many years. She had normal coronaries in 2002. In Jan 2018 she fell and broke her hip. She had surgery but also had chest pain and a cath done then showed high grade LAD disease which was stented by Dr [name]. She did well until her recent admission 08/13/17 with a NSTEMI. She had back pain while at church that then radiated to her chest. She says she knew what it was because it was similar to her symptoms in jan 2018. Cath revealed a pLAD and mLAD stenosis, both sites stented. Her EF was 35-40%. She is in the office today for follow up. Her sister stayed with her for a few weeks but has left. The pt says she feels "tired" but is not having chest pain or DOE.    Current Outpatient Medications  Medication Sig Dispense Refill  . ALBUTEROL SULFATE HFA IN Inhale 2 puffs into the lungs every 6 (six) hours as needed (shortness of breath/wheezing).    Marland Kitchen aspirin 81 MG EC tablet Take 1 tablet (81 mg total) by mouth daily. 30 tablet 11  . Calcium Carb-Cholecalciferol (CALCIUM-VITAMIN D) 600-400 MG-UNIT TABS Take by mouth.    . carvedilol (COREG) 25 MG tablet Take 1 tablet (25 mg total) by mouth 2 (two) times daily with a meal. 60 tablet 6  . Cholecalciferol (VITAMIN D PO) Take 1 tablet by mouth daily.    . clopidogrel (PLAVIX) 75 MG tablet Take 1 tablet (75 mg total) by mouth daily with breakfast. 30 tablet 2  . Cyanocobalamin (VITAMIN B-12 PO) Take 1 tablet by mouth daily.    . feeding supplement (BOOST / RESOURCE BREEZE) LIQD Take 1 Container by mouth 2 (two) times daily between meals.  0  . fenofibrate 160 MG tablet Take 1 tablet (160 mg total) by mouth daily. 30 tablet 6  . levothyroxine (SYNTHROID, LEVOTHROID) 88 MCG tablet Take 88 mcg by mouth daily before breakfast. Must be NAME BRAND "Synthroid"    . losartan (COZAAR) 50 MG  tablet Take 1 tablet (50 mg total) by mouth daily. 90 tablet 1  . methocarbamol (ROBAXIN) 500 MG tablet Take 1 tablet (500 mg total) by mouth every 6 (six) hours as needed for muscle spasms. 30 tablet 3  . Mometasone Furo-Formoterol Fum (DULERA IN) Inhale 1 puff into the lungs daily as needed (shortness of breath/wheezing).    . nitroGLYCERIN (NITROSTAT) 0.4 MG SL tablet Place 0.4 mg under the tongue every 5 (five) minutes as needed for chest pain.    . rosuvastatin (CRESTOR) 20 MG tablet Take 0.5 tablets (10 mg total) by mouth every other day. 30 tablet 2  . venlafaxine XR (EFFEXOR-XR) 150 MG 24 hr capsule Take 150-300 mg by mouth daily.     . lipase/protease/amylase (CREON) 36000 UNITS CPEP capsule Take 74,128-78,676 Units by mouth See admin instructions. Take one capsule (36,000 units) by mouth every morning, may increase to two capsules (72,000 units) as needed for diarrhea     No current facility-administered medications for this visit.    Facility-Administered Medications Ordered in Other Visits  Medication Dose Route Frequency Provider Last Rate Last Dose  . fentaNYL (SUBLIMAZE) injection    Anesthesia Intra-op Marinda Elk A, CRNA   50 mcg at 05/31/16 0721  . lactated ringers infusion    Continuous PRN Marinda Elk A,  CRNA 50 mL/hr at 06/03/16 2300      Allergies  Allergen Reactions  . Cortisone Anaphylaxis    Some forms of cortisone  . Demerol [Meperidine] Anaphylaxis  . Monosodium Glutamate Anaphylaxis  . Morphine And Related Anaphylaxis, Nausea And Vomiting and Other (See Comments)  . Prednisone Anaphylaxis    confusion  . Shellfish Allergy Anaphylaxis  . Sulfa Antibiotics Anaphylaxis  . Buprenorphine Hcl Nausea And Vomiting and Hypertension    SHAKING  . Readi-Cat [Barium Sulfate] Diarrhea    Patient does not want to drink Readi-Cat because it gives her diarrhea  . Xanax [Alprazolam] Other (See Comments)    "makes me crazy"  . Benzoic Acid Nausea Only    Past  Medical History:  Diagnosis Date  . Anemia   . Arthritis   . Bleeding diathesis ([place]) 02/22/2013  . Closed right hip fracture ([place]) 05/28/2016  . Coronary artery disease   . E coli enteritis   . GERD (gastroesophageal reflux disease)   . H/O hiatal hernia   . Head injury   . Hypertension   . Iron deficiency anemia, unspecified 02/22/2013  . Mass of right kidney 08/22/2013   2.5 cm solid lesion seen on 5/14 CT  . Radicular pain of thoracic region 08/22/2013   Left T-8 started after pushing heavy chest 1/15; intermittent; no focal neuro deficit    Social History   Socioeconomic History  . Marital status: Widowed    Spouse name: Not on file  . Number of children: 1  . Years of education: Not on file  . Highest education level: Not on file  Occupational History  . Occupation: retired  Scientific laboratory technician  . Financial resource strain: Not on file  . Food insecurity:    Worry: Not on file    Inability: Not on file  . Transportation needs:    Medical: Not on file    Non-medical: Not on file  Tobacco Use  . Smoking status: Never Smoker  . Smokeless tobacco: Never Used  Substance and Sexual Activity  . Alcohol use: No    Alcohol/week: 0.0 oz  . Drug use: No  . Sexual activity: Not on file  Lifestyle  . Physical activity:    Days per week: Not on file    Minutes per session: Not on file  . Stress: Not on file  Relationships  . Social connections:    Talks on phone: Not on file    Gets together: Not on file    Attends religious service: Not on file    Active member of club or organization: Not on file    Attends meetings of clubs or organizations: Not on file    Relationship status: Not on file  . Intimate partner violence:    Fear of current or ex partner: Not on file    Emotionally abused: Not on file    Physically abused: Not on file    Forced sexual activity: Not on file  Other Topics Concern  . Not on file  Social History Narrative  . Not on file     Family  History  Problem Relation Age of Onset  . Heart attack Father   . Heart disease Mother   . Alzheimer's disease Mother   . Heart disease Brother   . Colon cancer Maternal Grandfather   . Stomach cancer Neg Hx   . Rectal cancer Neg Hx   . Esophageal cancer Neg Hx   . Liver cancer Neg Hx  Review of Systems: General: negative for chills, fever, night sweats or weight changes.  Cardiovascular: negative for chest pain, dyspnea on exertion, edema, orthopnea, palpitations, paroxysmal nocturnal dyspnea or shortness of breath Dermatological: negative for rash Respiratory: negative for cough or wheezing Urologic: negative for hematuria Abdominal: negative for nausea, vomiting, diarrhea, bright red blood per rectum, melena, or hematemesis Neurologic: negative for visual changes, syncope, or dizziness All other systems reviewed and are otherwise negative except as noted above.    Blood pressure 124/70, pulse 70, height 5\' 5"  (1.651 m), weight 157 lb (71.2 kg).  General appearance: alert, cooperative and no distress Neck: no carotid bruit and no JVD Lungs: clear to auscultation bilaterally Heart: regular rate and rhythm Extremities: extremities normal, atraumatic, no cyanosis or edema Skin: Skin color, texture, turgor normal. No rashes or lesions Neurologic: Grossly normal  EKG NSR TWI-anterior lateral leads  ASSESSMENT AND PLAN:   NSTEMI (non-ST elevated myocardial infarction) [facility]) NSTEMI Jan 2018 and April 09-2017 Seen today post NSTEMI  CAD S/P percutaneous coronary angioplasty Non-STEMI January 2018 in the setting of a hip fracture Catheterization 06/01/16 showing 90% mid LAD stenosis-DES NSTEMI 08/13/17- cath mLAD and pLAD PCI with DES  Ischemic cardiomyopathy EF 35-40% post NSTEMI April 2019  Hypertension Controlled  Dyslipidemia, goal LDL below 70 On high dose statin Rx-LDL 57 when she had her NSTEMI April 2019   PLAN  Same Rx- will f/u on labs done by her PCP. F/U  Dr [name] in 6 weeks.  Kerin Ransom PA-C 08/24/2017 8:39 AM   Labs reviewed from 08/23/17- K+ 3.9 LDL 67 Hgb 11.1 TSH 0.98  Caitlyn Arkin PA-C 08/24/2017 10:29 AM

## 2017-08-24 NOTE — Assessment & Plan Note (Signed)
Controlled.  

## 2017-08-24 NOTE — Patient Instructions (Signed)
Medication Instructions:  Your physician recommends that you continue on your current medications as directed. Please refer to the Current Medication list given to you today.  Labwork: None   Testing/Procedures: None   Follow-Up: Keep upcoming appointment with Dr [name] as scheduled  Any Other Special Instructions Will Be Listed Below (If Applicable).  If you need a refill on your cardiac medications before your next appointment, please call your pharmacy.

## 2017-08-24 NOTE — Assessment & Plan Note (Signed)
Non-STEMI January 2018 in the setting of a hip fracture Catheterization 06/01/16 showing 90% mid LAD stenosis-DES NSTEMI 08/13/17- cath mLAD and pLAD PCI with DES

## 2017-08-24 NOTE — Assessment & Plan Note (Signed)
On high dose statin Rx-LDL 57 when she had her NSTEMI April 2019

## 2017-08-24 NOTE — Assessment & Plan Note (Signed)
EF 35-40% post NSTEMI April 2019

## 2017-08-25 ENCOUNTER — Telehealth (HOSPITAL_COMMUNITY): Payer: Self-pay

## 2017-08-25 NOTE — Telephone Encounter (Signed)
Called patient in regards to Cardiac Rehab and patient stated she is interested in the program although she is not ready at the moment. I explained to patient that we are currently scheduling for the month of June. Patient asked that I call towards the end of the week next week.

## 2017-09-10 ENCOUNTER — Telehealth (HOSPITAL_COMMUNITY): Payer: Self-pay

## 2017-09-10 NOTE — Telephone Encounter (Signed)
Called to follow up with patient in regards to Cardiac Rehab - Patient stated she goes to see Dr.Berry on Friday 09/17/2017. She would like to follow up with Cardiologist first before agreeing to do the program. I informed patient that I will call to follow up with her on Monday 09/20/17 as she has late appt on Friday.

## 2017-09-17 ENCOUNTER — Ambulatory Visit (INDEPENDENT_AMBULATORY_CARE_PROVIDER_SITE_OTHER): Payer: PPO | Admitting: Cardiovascular Disease

## 2017-09-17 ENCOUNTER — Encounter: Payer: Self-pay | Admitting: Cardiovascular Disease

## 2017-09-17 VITALS — BP 127/83 | HR 79 | Ht 65.0 in | Wt 151.8 lb

## 2017-09-17 DIAGNOSIS — I10 Essential (primary) hypertension: Secondary | ICD-10-CM

## 2017-09-17 DIAGNOSIS — Z955 Presence of coronary angioplasty implant and graft: Secondary | ICD-10-CM

## 2017-09-17 DIAGNOSIS — I255 Ischemic cardiomyopathy: Secondary | ICD-10-CM | POA: Diagnosis not present

## 2017-09-17 DIAGNOSIS — E785 Hyperlipidemia, unspecified: Secondary | ICD-10-CM

## 2017-09-17 DIAGNOSIS — I519 Heart disease, unspecified: Secondary | ICD-10-CM | POA: Diagnosis not present

## 2017-09-17 NOTE — Assessment & Plan Note (Signed)
EF of 35 to 40% on the day for non-STEMI whereas previously had been normal.  She is on optimal therapy.  We will recheck a 2D echocardiogram.  She does complain of some shortness of breath does not appear to be volume overloaded.

## 2017-09-17 NOTE — Assessment & Plan Note (Signed)
History of essential hypertension her blood pressure measured at 127/83.  She is on carvedilol and losartan.  Continue current meds at current dosing.

## 2017-09-17 NOTE — Assessment & Plan Note (Signed)
History of hyperlipidemia on fenofibrate and Crestor with recent lipid profile performed 08/14/2017 revealing total cholesterol 150, LDL 87 and HDL of 45.

## 2017-09-17 NOTE — Patient Instructions (Signed)
Medication Instructions: Your physician recommends that you continue on your current medications as directed. Please refer to the Current Medication list given to you today.   Testing/Procedures: Your physician has requested that you have an echocardiogram in 1 month. Echocardiography is a painless test that uses sound waves to create images of your heart. It provides your doctor with information about the size and shape of your heart and how well your heart's chambers and valves are working. This procedure takes approximately one hour. There are no restrictions for this procedure.  Follow-Up: Your physician recommends that you schedule a follow-up appointment in: 3 months with Dr. [name].  If you need a refill on your cardiac medications before your next appointment, please call your pharmacy.

## 2017-09-17 NOTE — Assessment & Plan Note (Signed)
History of CAD post mid LAD stenting by myself 06/01/2016 with a synergy drug-eluting stent.  She came in with a non-STEMI on 08/13/2017 with very low troponins and subtle ST segment changes and underwent cardiac catheterization radially by Dr. Angelena Form on 08/14/2017 revealing 99% proximal LAD lesion as well as a 70% lesion just proximal to the previously placed stent and had to synergy stents placed successfully.  Her EF was 35 to 45% with anteroapical wall motion abnormality suggesting "stunned myocardium.  I am going to recheck a 2D echo next month to see if she is had improvement in LV function.

## 2017-09-17 NOTE — Progress Notes (Signed)
09/17/2017 Caitlyn [name]   [birth year]  [id number]  Primary Physician Josetta Huddle, MD Primary Cardiologist: Lorretta Harp MD [name], [place]  HPI:  Caitlyn [name] is a 72 y.o.  mildly overweight Caucasian female who I been taking care of for many years. I last saw her in the office  03/02/2017.I did perform coronary angiography on her in 2002 demonstrating normal coronary arteries. She does have a strong family history of heart disease. She apparently fell and broke her hip on the ice several days ago and was admitted to the hospital with an intention to perform ORIF of her right hip by Dr. Lyla Glassing . She has had several episodes of chest pain with mildly elevated troponin. I performed cardiac catheterization on her 06/01/16 revealing a 90% mid LAD lesion which I stented with a Synergy drug-eluting stent with an excellent result. She underwent right ORIF for fractured hip one week later by Dr. Lyla Glassing on dual antiplatelet therapy. She underwent physical therapy and rehabilitation.  She was doing well until 08/13/2017 when she developed chest pain was brought to [facility].  She had a "non-STEMI with a very low troponin rise and minimal ST segment changes.  The following day, 08/14/2017, she underwent a radial cath by Dr. Angelena Form revealing a 90% proximal LAD lesion, and a 70% lesion just proximal to the previous he placed mid LAD stent both of which were stented with synergy drug-eluting stents.  Her EF was in the 35 to 40% range, significantly lower than it had been previously which I 6 suspect was related to "stunned myocardium".  She does complain of some mild shortness of breath.    Current Meds  Medication Sig  . ALBUTEROL SULFATE HFA IN Inhale 2 puffs into the lungs every 6 (six) hours as needed (shortness of breath/wheezing).  Marland Kitchen aspirin 81 MG EC tablet Take 1 tablet (81 mg total) by mouth daily.  . Calcium Carb-Cholecalciferol (CALCIUM-VITAMIN D) 600-400 MG-UNIT TABS Take  by mouth.  . carvedilol (COREG) 25 MG tablet Take 1 tablet (25 mg total) by mouth 2 (two) times daily with a meal. (Patient taking differently: Take 25 mg by mouth daily. 50mg  qd)  . Cholecalciferol (VITAMIN D PO) Take 1 tablet by mouth daily.  . clopidogrel (PLAVIX) 75 MG tablet Take 1 tablet (75 mg total) by mouth daily with breakfast.  . Cyanocobalamin (VITAMIN B-12 PO) Take 1 tablet by mouth daily.  . feeding supplement (BOOST / RESOURCE BREEZE) LIQD Take 1 Container by mouth 2 (two) times daily between meals.  . fenofibrate 160 MG tablet Take 1 tablet (160 mg total) by mouth daily.  Marland Kitchen levothyroxine (SYNTHROID, LEVOTHROID) 88 MCG tablet Take 88 mcg by mouth daily before breakfast. Must be NAME BRAND "Synthroid"  . lipase/protease/amylase (CREON) 36000 UNITS CPEP capsule Take 36,000-72,000 Units by mouth See admin instructions. Take one capsule (36,000 units) by mouth every morning, may increase to two capsules (72,000 units) as needed for diarrhea  . losartan (COZAAR) 50 MG tablet Take 1 tablet (50 mg total) by mouth daily.  . methocarbamol (ROBAXIN) 500 MG tablet Take 1 tablet (500 mg total) by mouth every 6 (six) hours as needed for muscle spasms.  . Mometasone Furo-Formoterol Fum (DULERA IN) Inhale 1 puff into the lungs daily as needed (shortness of breath/wheezing).  . nitroGLYCERIN (NITROSTAT) 0.4 MG SL tablet Place 0.4 mg under the tongue every 5 (five) minutes as needed for chest pain.  . rosuvastatin (CRESTOR) 20 MG tablet  Take 0.5 tablets (10 mg total) by mouth every other day.  . venlafaxine XR (EFFEXOR-XR) 150 MG 24 hr capsule Take 150-300 mg by mouth daily.      Allergies  Allergen Reactions  . Cortisone Anaphylaxis    Some forms of cortisone  . Demerol [Meperidine] Anaphylaxis  . Monosodium Glutamate Anaphylaxis  . Morphine And Related Anaphylaxis, Nausea And Vomiting and Other (See Comments)  . Prednisone Anaphylaxis    confusion  . Shellfish Allergy Anaphylaxis  . Sulfa  Antibiotics Anaphylaxis  . Buprenorphine Hcl Nausea And Vomiting and Hypertension    SHAKING  . Readi-Cat [Barium Sulfate] Diarrhea    Patient does not want to drink Readi-Cat because it gives her diarrhea  . Xanax [Alprazolam] Other (See Comments)    "makes me crazy"  . Benzoic Acid Nausea Only    Social History   Socioeconomic History  . Marital status: Widowed    Spouse name: Not on file  . Number of children: 1  . Years of education: Not on file  . Highest education level: Not on file  Occupational History  . Occupation: retired  Scientific laboratory technician  . Financial resource strain: Not on file  . Food insecurity:    Worry: Not on file    Inability: Not on file  . Transportation needs:    Medical: Not on file    Non-medical: Not on file  Tobacco Use  . Smoking status: Never Smoker  . Smokeless tobacco: Never Used  Substance and Sexual Activity  . Alcohol use: No    Alcohol/week: 0.0 oz  . Drug use: No  . Sexual activity: Not on file  Lifestyle  . Physical activity:    Days per week: Not on file    Minutes per session: Not on file  . Stress: Not on file  Relationships  . Social connections:    Talks on phone: Not on file    Gets together: Not on file    Attends religious service: Not on file    Active member of club or organization: Not on file    Attends meetings of clubs or organizations: Not on file    Relationship status: Not on file  . Intimate partner violence:    Fear of current or ex partner: Not on file    Emotionally abused: Not on file    Physically abused: Not on file    Forced sexual activity: Not on file  Other Topics Concern  . Not on file  Social History Narrative  . Not on file     Review of Systems: General: negative for chills, fever, night sweats or weight changes.  Cardiovascular: negative for chest pain, dyspnea on exertion, edema, orthopnea, palpitations, paroxysmal nocturnal dyspnea or shortness of breath Dermatological: negative for  rash Respiratory: negative for cough or wheezing Urologic: negative for hematuria Abdominal: negative for nausea, vomiting, diarrhea, bright red blood per rectum, melena, or hematemesis Neurologic: negative for visual changes, syncope, or dizziness All other systems reviewed and are otherwise negative except as noted above.    Blood pressure 127/83, pulse 79, height 5\' 5"  (1.651 m), weight 151 lb 12.8 oz (68.9 kg).  General appearance: alert and no distress Neck: no adenopathy, no carotid bruit, no JVD, supple, symmetrical, trachea midline and thyroid not enlarged, symmetric, no tenderness/mass/nodules Lungs: clear to auscultation bilaterally Heart: regular rate and rhythm, S1, S2 normal, no murmur, click, rub or gallop Extremities: extremities normal, atraumatic, no cyanosis or edema Pulses: 2+ and symmetric Skin:  Skin color, texture, turgor normal. No rashes or lesions Neurologic: Alert and oriented X 3, normal strength and tone. Normal symmetric reflexes. Normal coordination and gait  EKG not performed today  ASSESSMENT AND PLAN:   Essential hypertension History of essential hypertension her blood pressure measured at 127/83.  She is on carvedilol and losartan.  Continue current meds at current dosing.  Dyslipidemia, goal LDL below 70 History of hyperlipidemia on fenofibrate and Crestor with recent lipid profile performed 08/14/2017 revealing total cholesterol 150, LDL 87 and HDL of 45.  Status post insertion of drug-eluting stent into left anterior descending (LAD) artery History of CAD post mid LAD stenting by myself 06/01/2016 with a synergy drug-eluting stent.  She came in with a non-STEMI on 08/13/2017 with very low troponins and subtle ST segment changes and underwent cardiac catheterization radially by Dr. Angelena Form on 08/14/2017 revealing 99% proximal LAD lesion as well as a 70% lesion just proximal to the previously placed stent and had to synergy stents placed successfully.  Her EF  was 35 to 45% with anteroapical wall motion abnormality suggesting "stunned myocardium.  I am going to recheck a 2D echo next month to see if she is had improvement in LV function.  Ischemic cardiomyopathy EF of 35 to 40% on the day for non-STEMI whereas previously had been normal.  She is on optimal therapy.  We will recheck a 2D echocardiogram.  She does complain of some shortness of breath does not appear to be volume overloaded.      Lorretta Harp MD FACP,FACC,FAHA, [facility] 09/17/2017 2:14 PM

## 2017-09-28 ENCOUNTER — Other Ambulatory Visit (HOSPITAL_COMMUNITY): Payer: PPO

## 2017-10-06 ENCOUNTER — Other Ambulatory Visit: Payer: Self-pay | Admitting: Internal Medicine

## 2017-10-06 DIAGNOSIS — Z1231 Encounter for screening mammogram for malignant neoplasm of breast: Secondary | ICD-10-CM

## 2017-10-11 ENCOUNTER — Other Ambulatory Visit: Payer: Self-pay

## 2017-10-11 ENCOUNTER — Ambulatory Visit (HOSPITAL_COMMUNITY): Payer: PPO | Attending: Cardiovascular Disease

## 2017-10-11 DIAGNOSIS — I119 Hypertensive heart disease without heart failure: Secondary | ICD-10-CM | POA: Insufficient documentation

## 2017-10-11 DIAGNOSIS — I351 Nonrheumatic aortic (valve) insufficiency: Secondary | ICD-10-CM | POA: Diagnosis not present

## 2017-10-11 DIAGNOSIS — I519 Heart disease, unspecified: Secondary | ICD-10-CM

## 2017-10-11 DIAGNOSIS — I255 Ischemic cardiomyopathy: Secondary | ICD-10-CM | POA: Insufficient documentation

## 2017-10-11 DIAGNOSIS — E785 Hyperlipidemia, unspecified: Secondary | ICD-10-CM | POA: Diagnosis not present

## 2017-10-12 ENCOUNTER — Telehealth (HOSPITAL_COMMUNITY): Payer: Self-pay

## 2017-10-12 NOTE — Telephone Encounter (Signed)
Called patient in regards to Cardiac Rehab - Scheduled orientation on 11/30/2017 at 1:30pm. Patient will attend the 1:15pm exc class. Mailed packet.

## 2017-11-02 ENCOUNTER — Other Ambulatory Visit: Payer: Self-pay | Admitting: Oncology

## 2017-11-02 DIAGNOSIS — D508 Other iron deficiency anemias: Secondary | ICD-10-CM

## 2017-11-02 DIAGNOSIS — K909 Intestinal malabsorption, unspecified: Secondary | ICD-10-CM

## 2017-11-08 ENCOUNTER — Other Ambulatory Visit (INDEPENDENT_AMBULATORY_CARE_PROVIDER_SITE_OTHER): Payer: PPO

## 2017-11-08 DIAGNOSIS — D508 Other iron deficiency anemias: Secondary | ICD-10-CM | POA: Diagnosis not present

## 2017-11-08 DIAGNOSIS — K909 Intestinal malabsorption, unspecified: Secondary | ICD-10-CM | POA: Diagnosis not present

## 2017-11-09 ENCOUNTER — Telehealth: Payer: Self-pay | Admitting: Oncology

## 2017-11-09 ENCOUNTER — Other Ambulatory Visit: Payer: Self-pay | Admitting: Oncology

## 2017-11-09 DIAGNOSIS — D508 Other iron deficiency anemias: Secondary | ICD-10-CM

## 2017-11-09 DIAGNOSIS — K909 Intestinal malabsorption, unspecified: Secondary | ICD-10-CM

## 2017-11-09 LAB — CBC WITH DIFFERENTIAL/PLATELET
BASOS: 0 %
Basophils Absolute: 0 10*3/uL (ref 0.0–0.2)
EOS (ABSOLUTE): 0.1 10*3/uL (ref 0.0–0.4)
EOS: 3 %
HEMATOCRIT: 35.3 % (ref 34.0–46.6)
HEMOGLOBIN: 10.9 g/dL — AB (ref 11.1–15.9)
IMMATURE GRANULOCYTES: 0 %
Immature Grans (Abs): 0 10*3/uL (ref 0.0–0.1)
LYMPHS ABS: 1.3 10*3/uL (ref 0.7–3.1)
Lymphs: 28 %
MCH: 24.9 pg — ABNORMAL LOW (ref 26.6–33.0)
MCHC: 30.9 g/dL — ABNORMAL LOW (ref 31.5–35.7)
MCV: 81 fL (ref 79–97)
MONOCYTES: 8 %
Monocytes Absolute: 0.4 10*3/uL (ref 0.1–0.9)
NEUTROS PCT: 61 %
Neutrophils Absolute: 2.8 10*3/uL (ref 1.4–7.0)
Platelets: 270 10*3/uL (ref 150–450)
RBC: 4.38 x10E6/uL (ref 3.77–5.28)
RDW: 14.5 % (ref 12.3–15.4)
WBC: 4.6 10*3/uL (ref 3.4–10.8)

## 2017-11-09 LAB — RETICULOCYTES: Retic Ct Pct: 1.4 % (ref 0.6–2.6)

## 2017-11-09 LAB — FERRITIN: Ferritin: 10 ng/mL — ABNORMAL LOW (ref 15–150)

## 2017-11-09 NOTE — Telephone Encounter (Signed)
Patient missed a called, pls call patient back

## 2017-11-09 NOTE — Telephone Encounter (Signed)
Feraheme appt scheduled July 10 @ 12 noon at [facility] short stay; pt was called/informed also instructed to schedule #2  appt after she finishes the first one.

## 2017-11-09 NOTE — Telephone Encounter (Signed)
-----   Message from Annia Belt, MD sent at 11/09/2017  9:15 AM EDT ----- Call pt: iron storage protein, ferritin, low again. Please schedule feraheme weekly x 2 with [facility] short stay & let pt know. She has a follow up w me on 7/15

## 2017-11-09 NOTE — Telephone Encounter (Signed)
Pt called / informed "iron storage protein, ferritin, low again. Please schedule feraheme weekly x 2 with [facility] short stay & let pt know. She has a follow up w me on 7/15 "mper Dr Beryle Beams. Informed Ferritin is 10 and Hgb 10.9. I will call [facility] short stay to schedule an appt.

## 2017-11-15 ENCOUNTER — Encounter: Payer: PPO | Admitting: Oncology

## 2017-11-17 ENCOUNTER — Ambulatory Visit (HOSPITAL_COMMUNITY)
Admission: RE | Admit: 2017-11-17 | Discharge: 2017-11-17 | Disposition: A | Discharge status: Home / Self Care | Payer: PPO | Source: Ambulatory Visit | Attending: Oncology | Admitting: Oncology

## 2017-11-17 DIAGNOSIS — K909 Intestinal malabsorption, unspecified: Secondary | ICD-10-CM | POA: Diagnosis not present

## 2017-11-17 DIAGNOSIS — D508 Other iron deficiency anemias: Secondary | ICD-10-CM | POA: Insufficient documentation

## 2017-11-17 MED ORDER — SODIUM CHLORIDE 0.9 % IV SOLN
510.0000 mg | INTRAVENOUS | Status: DC
  Administered 2017-11-17: 510 mg via INTRAVENOUS
  Filled 2017-11-17: qty 17

## 2017-11-22 ENCOUNTER — Other Ambulatory Visit: Payer: Self-pay

## 2017-11-22 ENCOUNTER — Ambulatory Visit (INDEPENDENT_AMBULATORY_CARE_PROVIDER_SITE_OTHER): Payer: PPO | Admitting: Oncology

## 2017-11-22 ENCOUNTER — Encounter: Payer: Self-pay | Admitting: Oncology

## 2017-11-22 VITALS — BP 158/66 | HR 84 | Temp 98.2°F | Ht 65.0 in | Wt 154.4 lb

## 2017-11-22 DIAGNOSIS — Z79899 Other long term (current) drug therapy: Secondary | ICD-10-CM

## 2017-11-22 DIAGNOSIS — K5730 Diverticulosis of large intestine without perforation or abscess without bleeding: Secondary | ICD-10-CM

## 2017-11-22 DIAGNOSIS — K449 Diaphragmatic hernia without obstruction or gangrene: Secondary | ICD-10-CM | POA: Diagnosis not present

## 2017-11-22 DIAGNOSIS — I2510 Atherosclerotic heart disease of native coronary artery without angina pectoris: Secondary | ICD-10-CM

## 2017-11-22 DIAGNOSIS — Z885 Allergy status to narcotic agent status: Secondary | ICD-10-CM

## 2017-11-22 DIAGNOSIS — E039 Hypothyroidism, unspecified: Secondary | ICD-10-CM

## 2017-11-22 DIAGNOSIS — N289 Disorder of kidney and ureter, unspecified: Secondary | ICD-10-CM

## 2017-11-22 DIAGNOSIS — Z91013 Allergy to seafood: Secondary | ICD-10-CM

## 2017-11-22 DIAGNOSIS — Z8781 Personal history of (healed) traumatic fracture: Secondary | ICD-10-CM

## 2017-11-22 DIAGNOSIS — D508 Other iron deficiency anemias: Secondary | ICD-10-CM | POA: Diagnosis not present

## 2017-11-22 DIAGNOSIS — Z8719 Personal history of other diseases of the digestive system: Secondary | ICD-10-CM | POA: Diagnosis not present

## 2017-11-22 DIAGNOSIS — Z888 Allergy status to other drugs, medicaments and biological substances status: Secondary | ICD-10-CM

## 2017-11-22 DIAGNOSIS — Z955 Presence of coronary angioplasty implant and graft: Secondary | ICD-10-CM | POA: Diagnosis not present

## 2017-11-22 DIAGNOSIS — Z79890 Hormone replacement therapy (postmenopausal): Secondary | ICD-10-CM

## 2017-11-22 DIAGNOSIS — D699 Hemorrhagic condition, unspecified: Secondary | ICD-10-CM

## 2017-11-22 DIAGNOSIS — I252 Old myocardial infarction: Secondary | ICD-10-CM | POA: Diagnosis not present

## 2017-11-22 DIAGNOSIS — Z882 Allergy status to sulfonamides status: Secondary | ICD-10-CM

## 2017-11-22 DIAGNOSIS — N2889 Other specified disorders of kidney and ureter: Secondary | ICD-10-CM

## 2017-11-22 DIAGNOSIS — Z91041 Radiographic dye allergy status: Secondary | ICD-10-CM

## 2017-11-22 NOTE — Progress Notes (Signed)
Hematology and Oncology Follow Up Visit  Caitlyn [name] [id number] 1945/09/11 72 y.o. 11/22/2017 10:45 AM   Principle Diagnosis: Encounter Diagnoses  Name Primary?  . Other iron deficiency anemia Yes  . Status post insertion of drug-eluting stent into left anterior descending (LAD) artery   . Mass of right kidney   . Bleeding diathesis [facility])   Updated clinical summary: 72 year old woman I initially saw in October 2014 to evaluate iron deficiency anemia. She had a negative upper and lower endoscopy except for a hiatus hernia. She reported extreme intolerance to oral iron preparations. Hemoglobin got as low as 7 g. When I saw her on March 08, 2013, ferritin was 7. Hemoglobin 10.6. MCV 79. I believe this was a post transfusion value. She received a dose of parenteral iron in our office 1020 milligrams on 03/24/2013. Hemoglobin came up to a peak value of 12.1 g by 05/01/2013 ; Ferritin came up to a peak value of 123 by December 22 but fell rapidly back down to 10 by 08/14/2013 and another infusion of iron was given on April 9. She required IV iron again in May 2016 when ferritin fell to 9. Most recent iron infusions given weekly 2 January 27 and 06/14/2015. Hemoglobin up from 10.7 in January to 14.8 on March 30 with MCV 85. Ferritin 173 up from 8.  Additional problem identified during her initial evaluation was a small, 1.3 cm lesion in her right kidney which enhanced. This was initially noted on a study done on 09/21/2013 and did not change on a study done on 08/29/2013. I obtained an MRI scan on March 13, 2014. There were no other areas of abnormality and no interval growth of this lesion. Most recent CT scan done 10/10/2014 showed that the lesion is now smaller and is felt to be an involuting cyst. Other incidental findings include a large hiatus hernia and colonic diverticulosis.  She suffered a traumatic right hip fracture in January 2018 and required surgery.  Subsequent evaluation of  chest pain with cardiac cath showed multivessel disease most prominent in the LAD with a 90% lesion requiring stenting.  She was hospitalized in February 2018 with an acute upper GI bleed.  Repeat endoscopy not done at that time.  She was treated with parenteral iron through our office and hemoglobin rose to a peak of 12.9 g by August 2018.  She suffered a non-ST wave elevation MI and was admitted on August 13, 2017.  Peak troponin 2.08.  Depressed ejection fraction 35-40% with grade 1 diastolic dysfunction and apical akinesis.  LAD 99% distal stenosis and 70% mid vessel stenosis and 2 stents were placed.   Interim History: Since last visit with me, she was admitted to [facility] on August 13, 2017 with stabbing anterior and posterior chest pain and was found to have a non-ST wave elevation myocardial infarction with peak troponin II 0.08.  She was found to have in-stent thrombosis of the LAD 99% distal 70% mid vessel and 2 new stents were placed.  This occurred while she was on aspirin and Plavix.  No new med changes were made with respect to her antiplatelet agents at time of discharge. She did have myocardial damage with estimated ejection fraction 75-10%, grade 1 diastolic dysfunction, and apical akinesis.  She denies any abdominal burning or pain.  No hematochezia or melena.  Hemoglobin at time of admission on April 5 was 11.6, fell to 9.6 at discharge April 8, and was 10.9 when we checked in  anticipation of today's visit on July 1.  Ferritin had fallen to 10.  I scheduled her for intravenous iron.  She received the first dose on July 10 and is scheduled for a second dose this Wednesday.  Almost coincident with receiving the iron which overall made her feel much better, she has developed profuse night sweats.  I looked back and a chest radiograph done on the hospital on admission April 5 was normal with no lymphadenopathy or pulmonary infiltrates.  Her exam today which will be recorded below shows no  lymphadenopathy and she is afebrile. She is ambulating without assistance now a year and a half status post traumatic right hip fracture.  She was initially told she may never walk again.  Her brother who is a year and a half older than her is currently in the hospital after suffering a stroke but is doing well.  Medications:   Current Outpatient Medications:  .  ALBUTEROL SULFATE HFA IN, Inhale 2 puffs into the lungs every 6 (six) hours as needed (shortness of breath/wheezing)., Disp: , Rfl:  .  aspirin 81 MG EC tablet, Take 1 tablet (81 mg total) by mouth daily., Disp: 30 tablet, Rfl: 11 .  Calcium Carb-Cholecalciferol (CALCIUM-VITAMIN D) 600-400 MG-UNIT TABS, Take by mouth., Disp: , Rfl:  .  carvedilol (COREG) 25 MG tablet, Take 1 tablet (25 mg total) by mouth 2 (two) times daily with a meal. (Patient taking differently: Take 25 mg by mouth daily. 50mg  qd), Disp: 60 tablet, Rfl: 6 .  Cholecalciferol (VITAMIN D PO), Take 1 tablet by mouth daily., Disp: , Rfl:  .  clopidogrel (PLAVIX) 75 MG tablet, Take 1 tablet (75 mg total) by mouth daily with breakfast., Disp: 30 tablet, Rfl: 2 .  Cyanocobalamin (VITAMIN B-12 PO), Take 1 tablet by mouth daily., Disp: , Rfl:  .  feeding supplement (BOOST / RESOURCE BREEZE) LIQD, Take 1 Container by mouth 2 (two) times daily between meals., Disp: , Rfl: 0 .  fenofibrate 160 MG tablet, Take 1 tablet (160 mg total) by mouth daily., Disp: 30 tablet, Rfl: 6 .  levothyroxine (SYNTHROID, LEVOTHROID) 88 MCG tablet, Take 88 mcg by mouth daily before breakfast. Must be NAME BRAND "Synthroid", Disp: , Rfl:  .  lipase/protease/amylase (CREON) 36000 UNITS CPEP capsule, Take 06,237-62,831 Units by mouth See admin instructions. Take one capsule (36,000 units) by mouth every morning, may increase to two capsules (72,000 units) as needed for diarrhea, Disp: , Rfl:  .  losartan (COZAAR) 50 MG tablet, Take 1 tablet (50 mg total) by mouth daily., Disp: 90 tablet, Rfl: 1 .   methocarbamol (ROBAXIN) 500 MG tablet, Take 1 tablet (500 mg total) by mouth every 6 (six) hours as needed for muscle spasms., Disp: 30 tablet, Rfl: 3 .  Mometasone Furo-Formoterol Fum (DULERA IN), Inhale 1 puff into the lungs daily as needed (shortness of breath/wheezing)., Disp: , Rfl:  .  nitroGLYCERIN (NITROSTAT) 0.4 MG SL tablet, Place 0.4 mg under the tongue every 5 (five) minutes as needed for chest pain., Disp: , Rfl:  .  rosuvastatin (CRESTOR) 20 MG tablet, Take 0.5 tablets (10 mg total) by mouth every other day., Disp: 30 tablet, Rfl: 2 .  venlafaxine XR (EFFEXOR-XR) 150 MG 24 hr capsule, Take 150-300 mg by mouth daily. , Disp: , Rfl:  No current facility-administered medications for this visit.   Facility-Administered Medications Ordered in Other Visits:  .  fentaNYL (SUBLIMAZE) injection, , , Anesthesia Intra-op, Tommi Rumps, Scot A, CRNA, 50 mcg  at 05/31/16 0721 .  lactated ringers infusion, , , Continuous PRN, Marinda Elk A, CRNA, Last Rate: 50 mL/hr at 06/03/16 2300  Allergies:  Allergies  Allergen Reactions  . Cortisone Anaphylaxis    Some forms of cortisone  . Demerol [Meperidine] Anaphylaxis  . Monosodium Glutamate Anaphylaxis  . Morphine And Related Anaphylaxis, Nausea And Vomiting and Other (See Comments)  . Prednisone Anaphylaxis    confusion  . Shellfish Allergy Anaphylaxis  . Sulfa Antibiotics Anaphylaxis  . Buprenorphine Hcl Nausea And Vomiting and Hypertension    SHAKING  . Readi-Cat [Barium Sulfate] Diarrhea    Patient does not want to drink Readi-Cat because it gives her diarrhea  . Xanax [Alprazolam] Other (See Comments)    "makes me crazy"  . Benzoic Acid Nausea Only    Review of Systems: See interim history Remaining ROS negative:   Physical Exam: Height 5\' 5"  (1.651 m). Wt Readings from Last 3 Encounters:  09/17/17 151 lb 12.8 oz (68.9 kg)  08/24/17 157 lb (71.2 kg)  08/16/17 156 lb 8 oz (71 kg)     General appearance: Well-nourished  Caucasian woman HENNT: Pharynx no erythema, exudate, mass, or ulcer. No thyromegaly or thyroid nodules Lymph nodes: No cervical, supraclavicular, or axillary lymphadenopathy Breasts:  Lungs: Clear to auscultation, resonant to percussion throughout Heart: Regular rhythm, no murmur, no gallop, no rub, no click, no edema Abdomen: Soft, nontender, normal bowel sounds, no mass, no organomegaly Extremities: No edema, no calf tenderness Musculoskeletal: no joint deformities GU:  Vascular: Carotid pulses 2+, no bruits, distal pulses: Dorsalis pedis 1+ symmetric Neurologic: Alert, oriented, PERRLA, , cranial nerves grossly normal, motor strength 5 over 5, reflexes 1+ symmetric, upper body coordination normal, gait normal, Skin: No rash or ecchymosis  Lab Results: CBC W/Diff    Component Value Date/Time   WBC 4.6 11/08/2017 1117   WBC 6.1 08/16/2017 0445   RBC 4.38 11/08/2017 1117   RBC 3.40 (L) 08/16/2017 0445   HGB 10.9 (L) 11/08/2017 1117   HGB 11.9 08/14/2013 1318   HCT 35.3 11/08/2017 1117   HCT 36.9 08/14/2013 1318   PLT 270 11/08/2017 1117   MCV 81 11/08/2017 1117   MCV 83.7 08/14/2013 1318   MCH 24.9 (L) 11/08/2017 1117   MCH 28.2 08/16/2017 0445   MCHC 30.9 (L) 11/08/2017 1117   MCHC 32.0 08/16/2017 0445   RDW 14.5 11/08/2017 1117   RDW 14.5 08/14/2013 1318   LYMPHSABS 1.3 11/08/2017 1117   LYMPHSABS 2.0 08/14/2013 1318   MONOABS 0.4 12/23/2016 1040   MONOABS 0.6 08/14/2013 1318   EOSABS 0.1 11/08/2017 1117   BASOSABS 0.0 11/08/2017 1117   BASOSABS 0.1 08/14/2013 1318     Chemistry      Component Value Date/Time   NA 139 08/16/2017 0445   NA 140 03/11/2017 1116   K 3.4 (L) 08/16/2017 0445   CL 104 08/16/2017 0445   CO2 25 08/16/2017 0445   BUN 17 08/16/2017 0445   BUN 15 03/11/2017 1116   CREATININE 0.87 08/16/2017 0445   CREATININE 0.65 09/18/2014 1042      Component Value Date/Time   CALCIUM 8.4 (L) 08/16/2017 0445   ALKPHOS 62 08/13/2017 2048   AST 25  08/13/2017 2048   ALT 11 (L) 08/13/2017 2048   BILITOT 0.9 08/13/2017 2048       Radiological Studies: No results found.  Impression:  1.  Iron deficiency anemia secondary to oral iron intolerance but I suspect she also has a component  of iron malabsorption. I will continue to monitor CBC and ferritin every 3-4 months.  Periodic iron infusions as indicated. I am going to schedule her to transition to Dr.Yan Burr Medico at the [facility] after the new year 2020 for her hematology care.  2.  Coronary artery disease with predominant disease in the LAD now status post serial stenting in January 2018 and again in April 2019.  3.  Non-ST wave elevation MI April 2019  4.  Hypothyroid on replacement  5.  Benign right kidney lesion likely a benign complex cyst.  Stable on serial imaging.  6.  Hiatus hernia with history of hematemesis requiring hospital admission February 2018.  CC: Patient Care Team: Josetta Huddle, MD as PCP - General (Internal Medicine) Lorretta Harp, MD as PCP - Cardiology (Cardiology) Annia Belt, MD as Consulting Physician (Oncology) Lorretta Harp, MD as Consulting Physician (Cardiology)   Murriel Hopper, MD, [place]  Hematology-Oncology/Internal Medicine     7/15/201910:45 AM

## 2017-11-22 NOTE — Patient Instructions (Addendum)
Iron infusion this Wednesday 7/17 Come back for lab February 22 2018 then again 05/25/18  Please schedule new patient appointment with Dr Truitt Merle at [facility] cancer center for January, 2020:  Oral iron intolerance/malabsorption requiring periodic IV iron infusions.

## 2017-11-23 ENCOUNTER — Other Ambulatory Visit (HOSPITAL_COMMUNITY): Payer: Self-pay | Admitting: *Deleted

## 2017-11-24 ENCOUNTER — Ambulatory Visit (HOSPITAL_COMMUNITY)
Admission: RE | Admit: 2017-11-24 | Discharge: 2017-11-24 | Disposition: A | Discharge status: Home / Self Care | Payer: PPO | Source: Ambulatory Visit | Attending: Oncology | Admitting: Oncology

## 2017-11-24 DIAGNOSIS — D509 Iron deficiency anemia, unspecified: Secondary | ICD-10-CM | POA: Insufficient documentation

## 2017-11-24 MED ORDER — SODIUM CHLORIDE 0.9 % IV SOLN
510.0000 mg | Freq: Once | INTRAVENOUS | Status: AC
  Administered 2017-11-24: 12:00:00 510 mg via INTRAVENOUS
  Filled 2017-11-24: qty 17

## 2017-11-26 ENCOUNTER — Telehealth (HOSPITAL_COMMUNITY): Payer: Self-pay

## 2017-11-26 NOTE — Telephone Encounter (Signed)
Called patient to see if she is interested in rescheduling cardiac rehab - patient stated she is not interested at this time as she is still having leg issues. Closed referral.

## 2017-11-28 ENCOUNTER — Observation Stay (HOSPITAL_COMMUNITY)
Admission: EM | Admit: 2017-11-28 | Discharge: 2017-11-30 | Disposition: A | Discharge status: Home / Self Care | Payer: PPO | Attending: Cardiology | Admitting: Cardiology

## 2017-11-28 ENCOUNTER — Encounter (HOSPITAL_COMMUNITY): Payer: Self-pay

## 2017-11-28 ENCOUNTER — Other Ambulatory Visit: Payer: Self-pay

## 2017-11-28 ENCOUNTER — Emergency Department (HOSPITAL_COMMUNITY): Payer: PPO

## 2017-11-28 DIAGNOSIS — R0789 Other chest pain: Secondary | ICD-10-CM | POA: Diagnosis not present

## 2017-11-28 DIAGNOSIS — E782 Mixed hyperlipidemia: Secondary | ICD-10-CM

## 2017-11-28 DIAGNOSIS — E785 Hyperlipidemia, unspecified: Secondary | ICD-10-CM | POA: Insufficient documentation

## 2017-11-28 DIAGNOSIS — R55 Syncope and collapse: Secondary | ICD-10-CM | POA: Diagnosis not present

## 2017-11-28 DIAGNOSIS — Z882 Allergy status to sulfonamides status: Secondary | ICD-10-CM | POA: Insufficient documentation

## 2017-11-28 DIAGNOSIS — Z885 Allergy status to narcotic agent status: Secondary | ICD-10-CM | POA: Insufficient documentation

## 2017-11-28 DIAGNOSIS — I959 Hypotension, unspecified: Secondary | ICD-10-CM | POA: Diagnosis not present

## 2017-11-28 DIAGNOSIS — E039 Hypothyroidism, unspecified: Secondary | ICD-10-CM | POA: Diagnosis not present

## 2017-11-28 DIAGNOSIS — I10 Essential (primary) hypertension: Secondary | ICD-10-CM | POA: Insufficient documentation

## 2017-11-28 DIAGNOSIS — I200 Unstable angina: Secondary | ICD-10-CM | POA: Diagnosis not present

## 2017-11-28 DIAGNOSIS — I252 Old myocardial infarction: Secondary | ICD-10-CM | POA: Insufficient documentation

## 2017-11-28 DIAGNOSIS — Z7902 Long term (current) use of antithrombotics/antiplatelets: Secondary | ICD-10-CM | POA: Diagnosis not present

## 2017-11-28 DIAGNOSIS — I2510 Atherosclerotic heart disease of native coronary artery without angina pectoris: Secondary | ICD-10-CM

## 2017-11-28 DIAGNOSIS — Z7982 Long term (current) use of aspirin: Secondary | ICD-10-CM | POA: Insufficient documentation

## 2017-11-28 DIAGNOSIS — K219 Gastro-esophageal reflux disease without esophagitis: Secondary | ICD-10-CM | POA: Diagnosis not present

## 2017-11-28 DIAGNOSIS — Z955 Presence of coronary angioplasty implant and graft: Secondary | ICD-10-CM | POA: Diagnosis not present

## 2017-11-28 DIAGNOSIS — M1990 Unspecified osteoarthritis, unspecified site: Secondary | ICD-10-CM | POA: Insufficient documentation

## 2017-11-28 DIAGNOSIS — I25110 Atherosclerotic heart disease of native coronary artery with unstable angina pectoris: Principal | ICD-10-CM | POA: Insufficient documentation

## 2017-11-28 DIAGNOSIS — R0602 Shortness of breath: Secondary | ICD-10-CM | POA: Diagnosis not present

## 2017-11-28 DIAGNOSIS — Z87828 Personal history of other (healed) physical injury and trauma: Secondary | ICD-10-CM | POA: Diagnosis not present

## 2017-11-28 DIAGNOSIS — Z9861 Coronary angioplasty status: Secondary | ICD-10-CM

## 2017-11-28 DIAGNOSIS — R079 Chest pain, unspecified: Secondary | ICD-10-CM | POA: Diagnosis not present

## 2017-11-28 DIAGNOSIS — K449 Diaphragmatic hernia without obstruction or gangrene: Secondary | ICD-10-CM | POA: Diagnosis not present

## 2017-11-28 LAB — COMPREHENSIVE METABOLIC PANEL
ALBUMIN: 3.6 g/dL (ref 3.5–5.0)
ALK PHOS: 37 U/L — AB (ref 38–126)
ALT: 18 U/L (ref 0–44)
ANION GAP: 12 (ref 5–15)
AST: 48 U/L — ABNORMAL HIGH (ref 15–41)
BILIRUBIN TOTAL: 1.3 mg/dL — AB (ref 0.3–1.2)
BUN: 18 mg/dL (ref 8–23)
CALCIUM: 9.3 mg/dL (ref 8.9–10.3)
CO2: 20 mmol/L — ABNORMAL LOW (ref 22–32)
Chloride: 105 mmol/L (ref 98–111)
Creatinine, Ser: 0.98 mg/dL (ref 0.44–1.00)
GFR calc Af Amer: >60 mL/min (ref >60)
GFR, EST NON AFRICAN AMERICAN: 55 mL/min — AB (ref >60)
Glucose, Bld: 172 mg/dL — ABNORMAL HIGH (ref 70–99)
POTASSIUM: 5.6 mmol/L — AB (ref 3.5–5.1)
Sodium: 137 mmol/L (ref 135–145)
TOTAL PROTEIN: 6.2 g/dL — AB (ref 6.5–8.1)

## 2017-11-28 LAB — TROPONIN I
Troponin I: <0.03 ng/mL (ref <0.03)
Troponin I: <0.03 ng/mL (ref <0.03)

## 2017-11-28 LAB — I-STAT TROPONIN, ED: TROPONIN I, POC: 0.01 ng/mL (ref 0.00–0.08)

## 2017-11-28 LAB — CBC WITH DIFFERENTIAL/PLATELET
ABS IMMATURE GRANULOCYTES: 0 10*3/uL (ref 0.0–0.1)
BASOS PCT: 1 %
Basophils Absolute: 0 10*3/uL (ref 0.0–0.1)
EOS ABS: 0.1 10*3/uL (ref 0.0–0.7)
Eosinophils Relative: 2 %
HCT: 37.8 % (ref 36.0–46.0)
Hemoglobin: 11.5 g/dL — ABNORMAL LOW (ref 12.0–15.0)
IMMATURE GRANULOCYTES: 0 %
Lymphocytes Relative: 23 %
Lymphs Abs: 1.4 10*3/uL (ref 0.7–4.0)
MCH: 25.6 pg — AB (ref 26.0–34.0)
MCHC: 30.4 g/dL (ref 30.0–36.0)
MCV: 84 fL (ref 78.0–100.0)
Monocytes Absolute: 0.5 10*3/uL (ref 0.1–1.0)
Monocytes Relative: 8 %
NEUTROS ABS: 4 10*3/uL (ref 1.7–7.7)
NEUTROS PCT: 66 %
PLATELETS: 239 10*3/uL (ref 150–400)
RBC: 4.5 MIL/uL (ref 3.87–5.11)
RDW: 18 % — AB (ref 11.5–15.5)
WBC: 6.1 10*3/uL (ref 4.0–10.5)

## 2017-11-28 LAB — MRSA PCR SCREENING: MRSA by PCR: NEGATIVE

## 2017-11-28 MED ORDER — SODIUM CHLORIDE 0.9 % WEIGHT BASED INFUSION
1.0000 mL/kg/h | INTRAVENOUS | Status: DC
  Administered 2017-11-28 – 2017-11-29 (×2): 1 mL/kg/h via INTRAVENOUS

## 2017-11-28 MED ORDER — ROSUVASTATIN CALCIUM 20 MG PO TABS
20.0000 mg | ORAL_TABLET | Freq: Every day | ORAL | Status: DC
  Administered 2017-11-28 – 2017-11-29 (×2): 20 mg via ORAL
  Filled 2017-11-28 (×2): qty 1

## 2017-11-28 MED ORDER — FENOFIBRATE 160 MG PO TABS
160.0000 mg | ORAL_TABLET | Freq: Every day | ORAL | Status: DC
  Administered 2017-11-29 – 2017-11-30 (×2): 160 mg via ORAL
  Filled 2017-11-28 (×2): qty 1

## 2017-11-28 MED ORDER — LEVOTHYROXINE SODIUM 88 MCG PO TABS
88.0000 ug | ORAL_TABLET | Freq: Every day | ORAL | Status: DC
  Administered 2017-11-29 – 2017-11-30 (×2): 88 ug via ORAL
  Filled 2017-11-28 (×2): qty 1

## 2017-11-28 MED ORDER — PANCRELIPASE (LIP-PROT-AMYL) 12000-38000 UNITS PO CPEP
36000.0000 [IU] | ORAL_CAPSULE | Freq: Three times a day (TID) | ORAL | Status: DC
  Administered 2017-11-28 – 2017-11-30 (×3): 36000 [IU] via ORAL
  Filled 2017-11-28 (×4): qty 3

## 2017-11-28 MED ORDER — SODIUM CHLORIDE 0.9 % IV SOLN: 250.0000 mL | INTRAVENOUS | Status: DC | PRN

## 2017-11-28 MED ORDER — CARVEDILOL 25 MG PO TABS
25.0000 mg | ORAL_TABLET | Freq: Two times a day (BID) | ORAL | Status: DC
  Administered 2017-11-28 – 2017-11-30 (×3): 25 mg via ORAL
  Filled 2017-11-28 (×3): qty 1

## 2017-11-28 MED ORDER — SODIUM CHLORIDE 0.9% FLUSH
3.0000 mL | Freq: Two times a day (BID) | INTRAVENOUS | Status: DC
  Administered 2017-11-29 (×2): 3 mL via INTRAVENOUS

## 2017-11-28 MED ORDER — LOSARTAN POTASSIUM 50 MG PO TABS
50.0000 mg | ORAL_TABLET | Freq: Every day | ORAL | Status: DC
  Administered 2017-11-29 – 2017-11-30 (×2): 50 mg via ORAL
  Filled 2017-11-28 (×2): qty 1

## 2017-11-28 MED ORDER — HYDRALAZINE HCL 10 MG PO TABS
10.0000 mg | ORAL_TABLET | Freq: Four times a day (QID) | ORAL | Status: DC | PRN
  Administered 2017-11-29: 10 mg via ORAL
  Filled 2017-11-28: qty 1

## 2017-11-28 MED ORDER — NITROGLYCERIN IN D5W 200-5 MCG/ML-% IV SOLN
0.0000 ug/min | Freq: Once | INTRAVENOUS | Status: AC
  Administered 2017-11-28: 5 ug/min via INTRAVENOUS
  Filled 2017-11-28: qty 250

## 2017-11-28 MED ORDER — NITROGLYCERIN 0.4 MG SL SUBL: 0.4000 mg | SUBLINGUAL_TABLET | SUBLINGUAL | Status: DC | PRN

## 2017-11-28 MED ORDER — METHOCARBAMOL 500 MG PO TABS
500.0000 mg | ORAL_TABLET | Freq: Four times a day (QID) | ORAL | Status: DC | PRN
Start: 2017-11-28 — End: 2017-11-30

## 2017-11-28 MED ORDER — SODIUM CHLORIDE 0.9% FLUSH: 3.0000 mL | INTRAVENOUS | Status: DC | PRN

## 2017-11-28 MED ORDER — VENLAFAXINE HCL ER 150 MG PO CP24
150.0000 mg | ORAL_CAPSULE | Freq: Every day | ORAL | Status: DC
  Administered 2017-11-29: 300 mg via ORAL
  Administered 2017-11-30: 150 mg via ORAL
  Filled 2017-11-28 (×2): qty 2

## 2017-11-28 MED ORDER — ASPIRIN 81 MG PO CHEW
81.0000 mg | CHEWABLE_TABLET | Freq: Every day | ORAL | Status: DC
  Administered 2017-11-29 – 2017-11-30 (×2): 81 mg via ORAL
  Filled 2017-11-28 (×2): qty 1

## 2017-11-28 MED ORDER — HEPARIN (PORCINE) IN NACL 100-0.45 UNIT/ML-% IJ SOLN
850.0000 [IU]/h | INTRAMUSCULAR | Status: DC
  Administered 2017-11-28: 850 [IU]/h via INTRAVENOUS
  Filled 2017-11-28: qty 250

## 2017-11-28 MED ORDER — CLOPIDOGREL BISULFATE 75 MG PO TABS
75.0000 mg | ORAL_TABLET | Freq: Every day | ORAL | Status: DC
  Administered 2017-11-29 – 2017-11-30 (×2): 75 mg via ORAL
  Filled 2017-11-28 (×2): qty 1

## 2017-11-28 NOTE — Progress Notes (Signed)
ANTICOAGULATION CONSULT NOTE - Initial Consult  Pharmacy Consult for heparin Indication: chest pain/ACS  Allergies  Allergen Reactions  . Cortisone Anaphylaxis    Some forms of cortisone  . Demerol [Meperidine] Anaphylaxis  . Monosodium Glutamate Anaphylaxis  . Morphine And Related Anaphylaxis, Nausea And Vomiting and Other (See Comments)  . Prednisone Anaphylaxis    confusion  . Shellfish Allergy Anaphylaxis  . Sulfa Antibiotics Anaphylaxis  . Buprenorphine Hcl Nausea And Vomiting and Hypertension    SHAKING  . Readi-Cat [Barium Sulfate] Diarrhea    Patient does not want to drink Readi-Cat because it gives her diarrhea  . Xanax [Alprazolam] Other (See Comments)    "makes me crazy"  . Benzoic Acid Nausea Only    Patient Measurements: TBW 69.1 kg Heparin Dosing Weight: 69.1 kg  Vital Signs: Temp: 98 F (36.7 C) (07/21 1132) Temp Source: Oral (07/21 1132) BP: 173/97 (07/21 1438) Pulse Rate: 85 (07/21 1438)  Labs: Recent Labs    11/28/17 1138  HGB 11.5*  HCT 37.8  PLT 239  CREATININE 0.98  TROPONINI <0.03    Estimated Creatinine Clearance: 49.1 mL/min (by C-G formula based on SCr of 0.98 mg/dL).   Medical History: Past Medical History:  Diagnosis Date  . Anemia   . Arthritis   . Bleeding diathesis ([place]) 02/22/2013  . Closed right hip fracture ([place]) 05/28/2016  . Coronary artery disease   . E coli enteritis   . GERD (gastroesophageal reflux disease)   . H/O hiatal hernia   . Head injury   . Hypertension   . Iron deficiency anemia, unspecified 02/22/2013  . Mass of right kidney 08/22/2013   2.5 cm solid lesion seen on 5/14 CT  . Radicular pain of thoracic region 08/22/2013   Left T-8 started after pushing heavy chest 1/15; intermittent; no focal neuro deficit   Assessment: 72 yo F presents with chest pain. PMH includes NSTEMI, CAD, HTN, iron deficient anemia. Pharmacy consulted to start anticoag. CBC stable.   Goal of Therapy:  Heparin level 0.3-0.7  units/ml Monitor platelets by anticoagulation protocol: Yes   Plan:  No bolus Start heparin gtt at 850 units/hr Monitor daily heparin level, CBC, s/s of bleed   Carl Butner J 11/28/2017,2:43 PM

## 2017-11-28 NOTE — H&P (Signed)
Cardiology Admission History and Physical:   Patient ID: Caitlyn [name]; MRN: [id number]; DOB: December 23, 1945   Admission date: 11/28/2017  Primary Care Provider: Josetta Huddle, MD Primary Cardiologist: Quay Burow, MD   Chief Complaint:  Chest pain  Patient Profile:   Caitlyn [name] is a 72 y.o. female with a history of CAD s/p prior PCIs (05/2016 and 08/2017) to proximal and mid LAD. She presents with chest pain.  History of Present Illness:   Caitlyn [name] is a 72 yr old woman with a history of CAD s/p prior PCIs (05/2016 and 08/2017) to proximal and mid LAD, hypertension, hyperlipidemia. She presents with chest pain, and she is seen at the request of Arlean Hopping in the ER for chest pain.  The patient's history is provided by herself and family members in the room. She endorses that she hasn't "felt herself" for some time. In the last few days, she has had increasing fatigue and chest pain. Last evening, she had chest pain and took a nitroglycerin with minimal relief. The pain lasted, and this morning she had an episode at church when she had 10/10 chest pain, sharp, digging, midsternal, nonradiating associated with near syncope, nausea, diaphoresis, and shortness of breath. Of note, she has never had typical chest pain with her prior MI events. She came to ER, and thus far her troponins and ECG have been unremarkable. Her chest pain went from a 10/10 to 1 6/10 on a nitro drip. Her blood pressure has been significantly elevated during her ER visit.  She denies recent illness. No fevers/chills, no cough. No PND, orthopnea, or LE edema. No melena or hematochezia, no hematuria.    Past Medical History:  Diagnosis Date  . Anemia   . Arthritis   . Bleeding diathesis ([place]) 02/22/2013  . Closed right hip fracture ([place]) 05/28/2016  . Coronary artery disease   . E coli enteritis   . GERD (gastroesophageal reflux disease)   . H/O hiatal hernia   . Head injury   . Hypertension   . Iron deficiency anemia,  unspecified 02/22/2013  . Mass of right kidney 08/22/2013   2.5 cm solid lesion seen on 5/14 CT  . Radicular pain of thoracic region 08/22/2013   Left T-8 started after pushing heavy chest 1/15; intermittent; no focal neuro deficit    Past Surgical History:  Procedure Laterality Date  . ABDOMINAL HYSTERECTOMY    . BREAST BIOPSY    . BREAST LUMPECTOMY Left   . BREAST REDUCTION SURGERY  1981  . BREAST SURGERY  1981   rt-nodes taken out-non cancer  . BREAST SURGERY  1981   rt lumpectomy  . CARDIAC CATHETERIZATION N/A 06/01/2016   Procedure: LEFT HEART CATH;  Surgeon: Lorretta Harp, MD;  Location: [facility];  Service: Cardiovascular;  Laterality: N/A;  . CARDIAC CATHETERIZATION N/A 06/01/2016   Procedure: Coronary Stent Intervention;  Surgeon: Lorretta Harp, MD;  Location: [facility];  Service: Cardiovascular;  Laterality: N/A;  . COLONOSCOPY    . CORONARY STENT INTERVENTION N/A 08/14/2017   Procedure: CORONARY STENT INTERVENTION;  Surgeon: Burnell Blanks, MD;  Location: [facility];  Service: Cardiovascular;  Laterality: N/A;  . FACIAL FRACTURE SURGERY  2000  . FEMUR IM NAIL Right 06/03/2016   Procedure: INTRAMEDULLARY (IM) NAIL FEMORAL;  Surgeon: Rod Can, MD;  Location: [facility];  Service: Orthopedics;  Laterality: Right;  . LEFT HEART CATH AND CORONARY ANGIOGRAPHY N/A 08/14/2017   Procedure: LEFT HEART CATH  AND CORONARY ANGIOGRAPHY;  Surgeon: Burnell Blanks, MD;  Location: [place] [facility];  Service: Cardiovascular;  Laterality: N/A;  . TONSILLECTOMY    . TRIGGER FINGER RELEASE Right 11/28/2013   Procedure: RELEASE TRIGGER FINGER/A-1 PULLEY RIGHT LONG FINGER;  Surgeon: Cammie Sickle, MD;  Location: [facility];  Service: Orthopedics;  Laterality: Right;  . URETHRA SURGERY     age 14     Medications Prior to Admission: Prior to Admission medications   Medication Sig Start Date End Date Taking? Authorizing Provider    ALBUTEROL SULFATE HFA IN Inhale 2 puffs into the lungs every 6 (six) hours as needed (shortness of breath/wheezing).   Yes [provider]  aspirin 81 MG EC tablet Take 1 tablet (81 mg total) by mouth daily. 08/16/17  Yes Bhagat, Bhavinkumar, PA  Calcium Carb-Cholecalciferol (CALCIUM-VITAMIN D) 600-400 MG-UNIT TABS Take by mouth.   Yes [provider]  carvedilol (COREG) 25 MG tablet Take 1 tablet (25 mg total) by mouth 2 (two) times daily with a meal. Patient taking differently: Take 25 mg by mouth daily. 50mg  qd 08/16/17  Yes Bhagat, Bhavinkumar, PA  Cholecalciferol (VITAMIN D PO) Take 1 tablet by mouth daily.   Yes [provider]  clopidogrel (PLAVIX) 75 MG tablet Take 1 tablet (75 mg total) by mouth daily with breakfast. 04/13/17  Yes Lorretta Harp, MD  fenofibrate 160 MG tablet Take 1 tablet (160 mg total) by mouth daily. 08/17/17  Yes Bhagat, Bhavinkumar, PA  levothyroxine (SYNTHROID, LEVOTHROID) 88 MCG tablet Take 88 mcg by mouth daily before breakfast. Must be NAME BRAND "Synthroid"   Yes [provider]  lipase/protease/amylase (CREON) 36000 UNITS CPEP capsule Take 90,240-97,353 Units by mouth as needed. Take one capsule (36,000 units) by mouth every morning, may increase to two capsules (72,000 units) as needed for diarrhea   Yes [provider]  losartan (COZAAR) 50 MG tablet Take 1 tablet (50 mg total) by mouth daily. 03/02/17  Yes Lorretta Harp, MD  methocarbamol (ROBAXIN) 500 MG tablet Take 1 tablet (500 mg total) by mouth every 6 (six) hours as needed for muscle spasms. 06/06/16  Yes Eugenie Filler, MD  Mometasone Furo-Formoterol Fum (DULERA IN) Inhale 1 puff into the lungs daily as needed (shortness of breath/wheezing).   Yes [provider]  nitroGLYCERIN (NITROSTAT) 0.4 MG SL tablet Place 0.4 mg under the tongue every 5 (five) minutes as needed for chest pain.   Yes [provider]  venlafaxine XR (EFFEXOR-XR) 150  MG 24 hr capsule Take 150-300 mg by mouth daily as needed.  03/08/17  Yes [provider]  feeding supplement (BOOST / RESOURCE BREEZE) LIQD Take 1 Container by mouth 2 (two) times daily between meals. Patient not taking: Reported on 11/28/2017 06/06/16   Eugenie Filler, MD  rosuvastatin (CRESTOR) 20 MG tablet Take 0.5 tablets (10 mg total) by mouth every other day. Patient not taking: Reported on 11/28/2017 04/13/17   Lorretta Harp, MD     Allergies:    Allergies  Allergen Reactions  . Cortisone Anaphylaxis    Some forms of cortisone  . Demerol [Meperidine] Anaphylaxis  . Monosodium Glutamate Anaphylaxis  . Morphine And Related Anaphylaxis, Nausea And Vomiting and Other (See Comments)  . Prednisone Anaphylaxis    confusion  . Shellfish Allergy Anaphylaxis  . Sulfa Antibiotics Anaphylaxis  . Buprenorphine Hcl Nausea And Vomiting and Hypertension    SHAKING  . Readi-Cat [Barium Sulfate] Diarrhea  Patient does not want to drink Readi-Cat because it gives her diarrhea  . Xanax [Alprazolam] Other (See Comments)    "makes me crazy"  . Benzoic Acid Nausea Only    Social History:   Social History   Socioeconomic History  . Marital status: Widowed    Spouse name: Not on file  . Number of children: 1  . Years of education: Not on file  . Highest education level: Not on file  Occupational History  . Occupation: retired  Scientific laboratory technician  . Financial resource strain: Not on file  . Food insecurity:    Worry: Not on file    Inability: Not on file  . Transportation needs:    Medical: Not on file    Non-medical: Not on file  Tobacco Use  . Smoking status: Never Smoker  . Smokeless tobacco: Never Used  Substance and Sexual Activity  . Alcohol use: No    Alcohol/week: 0.0 oz  . Drug use: No  . Sexual activity: Not on file  Lifestyle  . Physical activity:    Days per week: Not on file    Minutes per session: Not on file  . Stress: Not on file  Relationships  .  Social connections:    Talks on phone: Not on file    Gets together: Not on file    Attends religious service: Not on file    Active member of club or organization: Not on file    Attends meetings of clubs or organizations: Not on file    Relationship status: Not on file  . Intimate partner violence:    Fear of current or ex partner: Not on file    Emotionally abused: Not on file    Physically abused: Not on file    Forced sexual activity: Not on file  Other Topics Concern  . Not on file  Social History Narrative  . Not on file    Family History:   The patient's family history includes Alzheimer's disease in her mother; Colon cancer in her maternal grandfather; Heart attack in her father; Heart disease in her brother and mother. There is no history of Stomach cancer, Rectal cancer, Esophageal cancer, or Liver cancer.    ROS:  Please see the history of present illness.  Review of Systems  Constitutional: Positive for diaphoresis and malaise/fatigue. Negative for chills, fever and weight loss.  HENT: Negative for ear pain and hearing loss.   Eyes: Negative for double vision and pain.  Respiratory: Positive for shortness of breath. Negative for cough, hemoptysis and wheezing.   Cardiovascular: Positive for chest pain. Negative for palpitations, orthopnea, claudication, leg swelling and PND.  Gastrointestinal: Negative for abdominal pain, blood in stool and melena.  Genitourinary: Negative for dysuria and hematuria.  Musculoskeletal: Negative for falls and myalgias.  Skin: Negative for itching and rash.  Neurological: Negative for focal weakness and loss of consciousness.  Endo/Heme/Allergies: Does not bruise/bleed easily.  Does also endorse mild memory issues, chronic  Physical Exam/Data:   Vitals:   11/28/17 1330 11/28/17 1345 11/28/17 1415 11/28/17 1438  BP: (!) 168/88 (!) 168/93 (!) 174/85 (!) 173/97  Pulse: 71 79 84 85  Resp: (!) 22 19 19    Temp:      TempSrc:      SpO2:  98% 99% 96% 97%    Intake/Output Summary (Last 24 hours) at 11/28/2017 1453 Last data filed at 11/28/2017 1443 Gross per 24 hour  Intake 5.81 ml  Output -  Net 5.81 ml   General:  Frail appearing elderly woman, in NAD. Fair historian, helped by her family. HEENT: normal Lymph: no adenopathy Neck: no JVD Endocrine:  No thryomegaly Vascular: No carotid bruits; FA pulses 2+ bilaterally without bruits  Cardiac:  normal S1, S2; RRR; no murmur Lungs:  clear to auscultation bilaterally, no wheezing, rhonchi or rales  Abd: soft, nontender, no hepatomegaly  Ext: no edema Musculoskeletal:  No deformities, BUE and BLE strength normal and equal Skin: warm and dry  Neuro:  CNs 2-12 intact, no focal abnormalities noted Psych:  Normal affect    EKG:  The ECG that was done was personally reviewed and demonstrates normal sinus rhythm with nonspecific ST changes. No ST elevations or T wave inversions.  Relevant CV Studies: Cath 08/14/17: 1. NSTEMI secondary to severe stenosis in the proximal LAD. The proximal LAD stenosis was a hazy, ulcerated appearing 99% lesion. There was an eccentric 70% stenosis in the mid LAD just before the old stent.  2. Mild non-obstructive disease in the first Diagonal, first OM, second OM, proximal RCA 3. Successful PTCA/DES x 1 mid LAD 4. Successful PTCA/DES x 1 proximal LAD 5. Moderate segmental LV systolic dysfunction  Cath 06/01/16:  Prox RCA lesion, 20 %stenosed.  Mid LAD lesion, 90 %stenosed.  Post intervention, there is a 0% residual stenosis.  A stent was successfully placed. Successful mid LAD PCI and drug-eluting stent in the setting of unstable angina/non-STEMI prior to ORIF of her right hip we spoke to her orthopedic surgeon who felt comfortable pinning her hip while on dual antiplatelet therapy. She left the lab in stable condition. Will continue full dose Angiomax for 4 hours post procedure.  Echo 10/11/17: Left ventricle: The cavity size was normal.  There was moderate   concentric hypertrophy. Systolic function was normal. The   estimated ejection fraction was in the range of 55% to 60%. Wall   motion was normal; there were no regional wall motion   abnormalities. - Aortic valve: There was trivial regurgitation. - Left atrium: The atrium was mildly dilated. - Atrial septum: No defect or patent foramen ovale was identified.  Laboratory Data:  Chemistry Recent Labs  Lab 11/28/17 1138  NA 137  K 5.6*  CL 105  CO2 20*  GLUCOSE 172*  BUN 18  CREATININE 0.98  CALCIUM 9.3  GFRNONAA 55*  GFRAA >60  ANIONGAP 12    Recent Labs  Lab 11/28/17 1138  PROT 6.2*  ALBUMIN 3.6  AST 48*  ALT 18  ALKPHOS 37*  BILITOT 1.3*   Hematology Recent Labs  Lab 11/28/17 1138  WBC 6.1  RBC 4.50  HGB 11.5*  HCT 37.8  MCV 84.0  MCH 25.6*  MCHC 30.4  RDW 18.0*  PLT 239   Cardiac Enzymes Recent Labs  Lab 11/28/17 1138  TROPONINI <0.03    Recent Labs  Lab 11/28/17 1144  TROPIPOC 0.01    BNPNo results for input(s): BNP, PROBNP in the last 168 hours.  DDimer No results for input(s): DDIMER in the last 168 hours.  Radiology/Studies:  Dg Chest 2 View  Result Date: 11/28/2017 CLINICAL DATA:  Pt states prior to arrival she felt diaphoretic and nauseated. Pt also endorses when she walked to front of church, she felt dizzy and had a moment of syncope. EXAM: CHEST - 2 VIEW COMPARISON:  Is 08/13/2017 FINDINGS: Normal cardiac silhouette. Large hiatal hernia. Lungs are clear. No effusion, infiltrate pneumothorax. IMPRESSION: 1.  No acute cardiopulmonary process. 2. Large  hiatal hernia. Electronically Signed   By: Suzy Bouchard M.D.   On: 11/28/2017 13:21    Assessment and Plan:   1. Unstable angina: While her symptoms are not typical, she has never had typical chest pain with her events. Furthermore, she demonstrated significant progression of her CAD from 05/2016-08/2017. Prior presentation had ECG changes and positive troponins while  this does not. However, she has had multiple episodes in the last several days requiring nitroglycerin. Episode today happened without significant exertion and with concerning associated symptoms of SOB, nausea, and diaphoresis. Given this, will treat her as unstable angina. -most recent cath in 08/2017 showed severe stenosis of proximal LAD (99% ulcerated lesion). There was also a 70% stenosis prior to the previous LAD stent from 05/2016. -has taken her aspirin and clopidogrel today. Starting heparin. -Hyperlipidemia: is now on fenofibrate. Was on rosuvastatin as well, but states she is not taking this. Unclear reason, no allergy noted. Will restart this. -notes that a shellfish allergy is in her history. However, she states that she can eat shrimp, but got throat tightness with eating crab many years ago. Also has allergies to steroids reportedly. On my evaluation, does not appear that she received contrast allergy pretreatment prior to cath, and she reports tolerating this without issue. She prefers not to pretreat, and there is limited association with shellfish and contrast allergy as well. -we discussed at length the options for evaluating her symptoms. This includes stress testing (cannot exercise, would require lexiscan). She and her family would strongly prefer cath given her history. She is high likelihood for progression of CAD, so it is reasonable to proceed directly to cath.  Risks and benefits of cardiac catheterization have been discussed with the patient.  These include bleeding, infection, kidney damage, stroke, heart attack, death.  The patient understands these risks and is willing to proceed.  Cath orders placed. -trend troponin, check ECG for worsening symptoms. -checking lipid panel, A1c, TSH -daily BMP, CBC -heparin per pharmacy consult -last echo normalized compared to prior. Consider repeat post cath.  2. Hypertension: poorly controlled. She notes that she has not been taking  her evening carvedilol dose as it makes it difficult to sleep. Will restart BID dosing and also add PRN hydralazine. -continue carvedilol, losartan, adding hydralazine PRN while inpatient.  Severity of Illness: The appropriate patient status for this patient is OBSERVATION. Observation status is judged to be reasonable and necessary in order to provide the required intensity of service to ensure the patient's safety. The patient's presenting symptoms, physical exam findings, and initial radiographic and laboratory data in the context of their medical condition is felt to place them at decreased risk for further clinical deterioration. Furthermore, it is anticipated that the patient will be medically stable for discharge from the hospital within 2 midnights of admission. The following factors support the patient status of observation.   " The patient's presenting symptoms include chest pain, nausea, near syncope, fatigue. " The physical exam findings include unremarkable physical exam. " The initial radiographic and laboratory data are stable from prior.  We did discuss code status. She believes she has a living will and POA. Not sure about a DNR. We discussed that the default is full code, and she will let us know if she wishes to pursue a code status other than this in the future.  For questions or updates, please contact [facility] Please consult [email or web address] for contact info under Cardiology/STEMI.   Signed, Buford Dresser, MD  11/28/2017 2:53  PM   TIME SPENT WITH PATIENT: >70 minutes of direct patient care. More than 50% of that time was spent on coordination of care and counseling regarding chest pain and evaluation.  Buford Dresser, MD, PhD [facility]

## 2017-11-28 NOTE — ED Notes (Signed)
Patient transported to X-ray 

## 2017-11-28 NOTE — ED Notes (Signed)
ED Provider at bedside. 

## 2017-11-28 NOTE — ED Notes (Signed)
Pt room assignment changed from [place] to [place].  Attempted report x1 - 2C

## 2017-11-28 NOTE — ED Triage Notes (Signed)
Pt states exertional sob yesterday while walking dog.  Began experiencing chest pain and took home nitro that relieved pain.  Pt then became sob and had syncopal episode.  Denies chest pain presently.  States this reminds her of last 2 NSTEMIs.  18R hand, 250 ns given for symptoms starting while walking around yesterday.  324 asa, no additional nitro given.  142/62, hr 67 nsr, rr 18, 99% ra.

## 2017-11-28 NOTE — ED Notes (Signed)
Pt states prior to arrival she felt diaphoretic and nauseated.   Pt also endorses when she walked to front of church, she felt dizzy and had a moment of syncope. Witnesses state pt was caught by a church member and was assisted to a sitting position.

## 2017-11-28 NOTE — ED Provider Notes (Addendum)
[facility] EMERGENCY DEPARTMENT Provider Note   CSN: [id number] Arrival date & time: 11/28/17  1121     History   Chief Complaint Chief Complaint  Patient presents with  . Chest Pain    HPI Caitlyn [name] is a 72 y.o. female.  HPI   Caitlyn [name] is a 72 y.o. female, with a history of NSTEMI, CAD, HTN, presenting to the ED with chest pain. Experienced an episode of chest pressure along with nausea, fatigue, and cold sweats, worse with walking.   When she woke up this morning she states she still "felt bad."  She went to church and began to have chest pain while seated. States, "This pain feels just like it did with my heart attack earlier this year."  Pain in his left chest, sharp, nonradiating, initially 10/10 prior to nitro, now 6/10.  Accompanied by nausea, shortness of breath, and diaphoresis.  Shortly after pain began, patient had a near syncopal episode while walking.  She was caught and lowered to the ground.  Denies fever/chills, vomiting, diarrhea, abdominal pain, cough, recent illness, lower extremity edema or pain, or any other complaints.  Denies history of PE/DVT, recent surgery, trauma, or immobilization.  Past Medical History:  Diagnosis Date  . Anemia   . Arthritis   . Bleeding diathesis ([place]) 02/22/2013  . Closed right hip fracture ([place]) 05/28/2016  . Coronary artery disease   . E coli enteritis   . GERD (gastroesophageal reflux disease)   . H/O hiatal hernia   . Head injury   . Hypertension   . Iron deficiency anemia, unspecified 02/22/2013  . Mass of right kidney 08/22/2013   2.5 cm solid lesion seen on 5/14 CT  . Radicular pain of thoracic region 08/22/2013   Left T-8 started after pushing heavy chest 1/15; intermittent; no focal neuro deficit    Patient Active Problem List   Diagnosis Date Noted  . Unstable angina ([place]) 11/28/2017  . Ischemic cardiomyopathy 08/24/2017  . Hypertension   . Head injury   . H/O hiatal hernia   .  GERD (gastroesophageal reflux disease)   . E coli enteritis   . Coronary artery disease   . Arthritis   . Anemia   . GI bleeding 06/29/2016  . Status post hip replacement, right 06/29/2016  . Acute GI bleeding   . History of cardiac cath   . CAD S/P percutaneous coronary angioplasty 06/07/2016  . Postop check   . Displaced intertrochanteric fracture of right femur, initial encounter for closed fracture ([place]) 06/03/2016  . Status post insertion of drug-eluting stent into left anterior descending (LAD) artery   . NSTEMI (non-ST elevated myocardial infarction) ([place])   . Hypothyroidism, acquired 05/28/2016  . Closed fracture of right hip ([place]) 05/28/2016  . Closed right hip fracture ([place]) 05/28/2016  . Essential hypertension 10/29/2015  . Dyslipidemia, goal LDL below 70 10/29/2015  . Mass of right kidney 08/22/2013  . Radicular pain of thoracic region 08/22/2013  . Iron deficiency anemia 02/22/2013  . Iron deficiency anemia, unspecified 02/22/2013  . Bleeding diathesis ([place]) 02/22/2013    Past Surgical History:  Procedure Laterality Date  . ABDOMINAL HYSTERECTOMY    . BREAST BIOPSY    . BREAST LUMPECTOMY Left   . BREAST REDUCTION SURGERY  1981  . BREAST SURGERY  1981   rt-nodes taken out-non cancer  . BREAST SURGERY  1981   rt lumpectomy  . CARDIAC CATHETERIZATION N/A 06/01/2016   Procedure: LEFT HEART CATH;  Surgeon: Lorretta Harp, MD;  Location: [facility];  Service: Cardiovascular;  Laterality: N/A;  . CARDIAC CATHETERIZATION N/A 06/01/2016   Procedure: Coronary Stent Intervention;  Surgeon: Lorretta Harp, MD;  Location: [facility];  Service: Cardiovascular;  Laterality: N/A;  . COLONOSCOPY    . CORONARY STENT INTERVENTION N/A 08/14/2017   Procedure: CORONARY STENT INTERVENTION;  Surgeon: Burnell Blanks, MD;  Location: [facility];  Service: Cardiovascular;  Laterality: N/A;  . FACIAL FRACTURE SURGERY  2000  . FEMUR IM NAIL Right 06/03/2016     Procedure: INTRAMEDULLARY (IM) NAIL FEMORAL;  Surgeon: Rod Can, MD;  Location: [facility];  Service: Orthopedics;  Laterality: Right;  . LEFT HEART CATH AND CORONARY ANGIOGRAPHY N/A 08/14/2017   Procedure: LEFT HEART CATH AND CORONARY ANGIOGRAPHY;  Surgeon: Burnell Blanks, MD;  Location: [facility];  Service: Cardiovascular;  Laterality: N/A;  . TONSILLECTOMY    . TRIGGER FINGER RELEASE Right 11/28/2013   Procedure: RELEASE TRIGGER FINGER/A-1 PULLEY RIGHT LONG FINGER;  Surgeon: Cammie Sickle, MD;  Location: [facility];  Service: Orthopedics;  Laterality: Right;  . URETHRA SURGERY     age 39     OB History   None      Home Medications    Prior to Admission medications   Medication Sig Start Date End Date Taking? Authorizing Provider  ALBUTEROL SULFATE HFA IN Inhale 2 puffs into the lungs every 6 (six) hours as needed (shortness of breath/wheezing).   Yes [provider]  aspirin 81 MG EC tablet Take 1 tablet (81 mg total) by mouth daily. 08/16/17  Yes Bhagat, Bhavinkumar, PA  Calcium Carb-Cholecalciferol (CALCIUM-VITAMIN D) 600-400 MG-UNIT TABS Take by mouth.   Yes [provider]  carvedilol (COREG) 25 MG tablet Take 1 tablet (25 mg total) by mouth 2 (two) times daily with a meal. Patient taking differently: Take 25 mg by mouth daily. 50mg  qd 08/16/17  Yes Bhagat, Bhavinkumar, PA  Cholecalciferol (VITAMIN D PO) Take 1 tablet by mouth daily.   Yes [provider]  clopidogrel (PLAVIX) 75 MG tablet Take 1 tablet (75 mg total) by mouth daily with breakfast. 04/13/17  Yes Lorretta Harp, MD  fenofibrate 160 MG tablet Take 1 tablet (160 mg total) by mouth daily. 08/17/17  Yes Bhagat, Bhavinkumar, PA  levothyroxine (SYNTHROID, LEVOTHROID) 88 MCG tablet Take 88 mcg by mouth daily before breakfast. Must be NAME BRAND "Synthroid"   Yes [provider]  lipase/protease/amylase (CREON) 36000 UNITS CPEP capsule Take 02,409-73,532  Units by mouth as needed. Take one capsule (36,000 units) by mouth every morning, may increase to two capsules (72,000 units) as needed for diarrhea   Yes [provider]  losartan (COZAAR) 50 MG tablet Take 1 tablet (50 mg total) by mouth daily. 03/02/17  Yes Lorretta Harp, MD  methocarbamol (ROBAXIN) 500 MG tablet Take 1 tablet (500 mg total) by mouth every 6 (six) hours as needed for muscle spasms. 06/06/16  Yes Eugenie Filler, MD  Mometasone Furo-Formoterol Fum (DULERA IN) Inhale 1 puff into the lungs daily as needed (shortness of breath/wheezing).   Yes [provider]  nitroGLYCERIN (NITROSTAT) 0.4 MG SL tablet Place 0.4 mg under the tongue every 5 (five) minutes as needed for chest pain.   Yes [provider]  venlafaxine XR (EFFEXOR-XR) 150 MG 24 hr capsule Take 150-300 mg by mouth daily as needed.  03/08/17  Yes [provider]  feeding supplement (BOOST / RESOURCE BREEZE) LIQD Take 1 Container by mouth 2 (two) times daily between meals. Patient not taking: Reported on 11/28/2017 06/06/16   Eugenie Filler, MD  rosuvastatin (CRESTOR) 20 MG tablet Take 0.5 tablets (10 mg total) by mouth every other day. Patient not taking: Reported on 11/28/2017 04/13/17   Lorretta Harp, MD    Family History Family History  Problem Relation Age of Onset  . Heart attack Father   . Heart disease Mother   . Alzheimer's disease Mother   . Heart disease Brother   . Colon cancer Maternal Grandfather   . Stomach cancer Neg Hx   . Rectal cancer Neg Hx   . Esophageal cancer Neg Hx   . Liver cancer Neg Hx     Social History Social History   Tobacco Use  . Smoking status: Never Smoker  . Smokeless tobacco: Never Used  Substance Use Topics  . Alcohol use: No    Alcohol/week: 0.0 oz  . Drug use: No     Allergies   Cortisone; Demerol [meperidine]; Monosodium glutamate; Morphine and related; Prednisone; Shellfish allergy; Sulfa antibiotics; Buprenorphine  hcl; Readi-cat [barium sulfate]; Xanax [alprazolam]; and Benzoic acid   Review of Systems Review of Systems  Constitutional: Positive for diaphoresis. Negative for fever.  Respiratory: Positive for shortness of breath. Negative for cough.   Cardiovascular: Positive for chest pain. Negative for leg swelling.  Gastrointestinal: Positive for nausea. Negative for abdominal pain, diarrhea and vomiting.  Neurological: Positive for syncope (near syncope) and light-headedness.  All other systems reviewed and are negative.    Physical Exam Updated Vital Signs BP (!) 141/63   Pulse 72   Temp 98 F (36.7 C) (Oral)   Resp 16   SpO2 98%   Physical Exam  Constitutional: She appears well-developed and well-nourished. No distress.  HENT:  Head: Normocephalic and atraumatic.  Eyes: Conjunctivae are normal.  Neck: Neck supple.  Cardiovascular: Normal rate, regular rhythm, normal heart sounds and intact distal pulses.  Pulmonary/Chest: Effort normal and breath sounds normal. No respiratory distress.  Abdominal: Soft. There is no tenderness. There is no guarding.  Musculoskeletal: She exhibits no edema.  Lymphadenopathy:    She has no cervical adenopathy.  Neurological: She is alert.  Skin: Skin is warm and dry. She is not diaphoretic.  Psychiatric: She has a normal mood and affect. Her behavior is normal.  Nursing note and vitals reviewed.    ED Treatments / Results  Labs (all labs ordered are listed, but only abnormal results are displayed) Labs Reviewed  COMPREHENSIVE METABOLIC PANEL - Abnormal; Notable for the following components:      Result Value   Potassium 5.6 (*)    CO2 20 (*)    Glucose, Bld 172 (*)    Total Protein 6.2 (*)    AST 48 (*)    Alkaline Phosphatase 37 (*)    Total Bilirubin 1.3 (*)    GFR calc non Af Amer 55 (*)    All other components within normal limits  CBC WITH DIFFERENTIAL/PLATELET - Abnormal; Notable for the following components:   Hemoglobin 11.5  (*)    MCH 25.6 (*)    RDW 18.0 (*)    All other components within normal limits  TROPONIN I  I-STAT TROPONIN, ED    EKG EKG Interpretation  Date/Time:  Sunday November 28 2017 11:31:40 EDT Ventricular Rate:  68 PR Interval:    QRS Duration: 83 QT Interval:  419 QTC Calculation: 446 R Axis:   19 Text Interpretation:  Sinus rhythm subtle ST depressions I, AVL T wave changes improved compared to April 2019 Confirmed by Sherwood Gambler [phone]) on 11/28/2017 11:35:27 AM Also confirmed by Sherwood Gambler [phone]), editor Lynder Parents [phone])  on 11/28/2017 12:53:48 PM   Radiology Dg Chest 2 View  Result Date: 11/28/2017 CLINICAL DATA:  Pt states prior to arrival she felt diaphoretic and nauseated. Pt also endorses when she walked to front of church, she felt dizzy and had a moment of syncope. EXAM: CHEST - 2 VIEW COMPARISON:  Is 08/13/2017 FINDINGS: Normal cardiac silhouette. Large hiatal hernia. Lungs are clear. No effusion, infiltrate pneumothorax. IMPRESSION: 1.  No acute cardiopulmonary process. 2. Large hiatal hernia. Electronically Signed   By: Suzy Bouchard M.D.   On: 11/28/2017 13:21    Procedures Procedures (including critical care time)  Medications Ordered in ED Medications  carvedilol (COREG) tablet 25 mg (has no administration in time range)  nitroGLYCERIN (NITROSTAT) SL tablet 0.4 mg (has no administration in time range)  nitroGLYCERIN 50 mg in dextrose 5 % 250 mL (0.2 mg/mL) infusion (0 mcg/min Intravenous Stopped 11/28/17 1443)     Initial Impression / Assessment and Plan / ED Course  I have reviewed the triage vital signs and the nursing notes.  Pertinent labs & imaging results that were available during my care of the patient were reviewed by me and considered in my medical decision making (see chart for details).  Clinical Course as of Nov 28 1453  Sun Nov 28, 2017  1430 Possible hemolysis, but patient has no noted EKG changes related to hyperkalemia.    Potassium(!): 5.6 [SJ]  1434 Spoke with Dr. Harrell Gave, cardiologist.  Recommends starting patient on heparin drip. Give her a dose of her cardvedilol early, discontinue NTG drip. Cardiology will admit patient. She has placed a PRN order for hydralazine should patient have continued hypertension.   [SJ]    Clinical Course User Index [SJ] Lorayne Bender, PA-C    Patient presents with chest pain that she states feels similar to her NSTEMI she experienced in April 2019.  Pain improved with nitroglycerin.  Here in the ED she is nontoxic-appearing, afebrile, not tachycardic, not tachypneic, and in no apparent distress.  Initial troponin negative. Wells score is 0, doubt PE.  Cardiology to admit, possible cath tomorrow.  Findings and plan of care discussed with Sherwood Gambler, MD Dr. Regenia Skeeter personally evaluated and examined this patient.  Vitals:   11/28/17 1330 11/28/17 1345 11/28/17 1415 11/28/17 1438  BP: (!) 168/88 (!) 168/93 (!) 174/85 (!) 173/97  Pulse: 71 79 84 85  Resp: (!) 22 19 19    Temp:      TempSrc:      SpO2: 98% 99% 96% 97%     Final Clinical Impressions(s) / ED Diagnoses   Final diagnoses:  Unstable angina [facility])  Atypical chest pain    ED Discharge Orders    None       Layla Maw 11/28/17 1454    Lorayne Bender, PA-C 11/28/17 1455    Sherwood Gambler, MD 11/30/17 1540

## 2017-11-28 NOTE — ED Notes (Signed)
Attempted report x1. 

## 2017-11-28 NOTE — ED Notes (Signed)
Cardiology at bedside.

## 2017-11-29 ENCOUNTER — Ambulatory Visit (HOSPITAL_COMMUNITY)
Admission: EM | Disposition: A | Discharge status: Home / Self Care | Payer: Self-pay | Source: Home / Self Care | Attending: Emergency Medicine

## 2017-11-29 ENCOUNTER — Encounter (HOSPITAL_COMMUNITY): Payer: Self-pay | Admitting: Emergency Medicine

## 2017-11-29 DIAGNOSIS — E7800 Pure hypercholesterolemia, unspecified: Secondary | ICD-10-CM

## 2017-11-29 DIAGNOSIS — I2510 Atherosclerotic heart disease of native coronary artery without angina pectoris: Secondary | ICD-10-CM | POA: Diagnosis not present

## 2017-11-29 HISTORY — PX: LEFT HEART CATH AND CORONARY ANGIOGRAPHY: CATH118249

## 2017-11-29 LAB — BASIC METABOLIC PANEL
Anion gap: 9 (ref 5–15)
Anion gap: 11 (ref 5–15)
BUN: 12 mg/dL (ref 8–23)
BUN: 10 mg/dL (ref 8–23)
CALCIUM: 9 mg/dL (ref 8.9–10.3)
CALCIUM: 9.1 mg/dL (ref 8.9–10.3)
CO2: 25 mmol/L (ref 22–32)
CO2: 23 mmol/L (ref 22–32)
CREATININE: 0.67 mg/dL (ref 0.44–1.00)
Chloride: 109 mmol/L (ref 98–111)
Chloride: 106 mmol/L (ref 98–111)
Creatinine, Ser: 0.78 mg/dL (ref 0.44–1.00)
GFR calc Af Amer: >60 mL/min (ref >60)
GLUCOSE: 159 mg/dL — AB (ref 70–99)
Glucose, Bld: 136 mg/dL — ABNORMAL HIGH (ref 70–99)
POTASSIUM: 3.5 mmol/L (ref 3.5–5.1)
Potassium: 3.7 mmol/L (ref 3.5–5.1)
SODIUM: 140 mmol/L (ref 135–145)
Sodium: 143 mmol/L (ref 135–145)

## 2017-11-29 LAB — LIPID PANEL
Cholesterol: 149 mg/dL (ref 0–200)
HDL: 49 mg/dL (ref >40)
LDL CALC: 69 mg/dL (ref 0–99)
Total CHOL/HDL Ratio: 3 RATIO
Triglycerides: 154 mg/dL — ABNORMAL HIGH (ref <150)
VLDL: 31 mg/dL (ref 0–40)

## 2017-11-29 LAB — CBC
HCT: 37.3 % (ref 36.0–46.0)
HCT: 38.6 % (ref 36.0–46.0)
Hemoglobin: 11.4 g/dL — ABNORMAL LOW (ref 12.0–15.0)
Hemoglobin: 11.8 g/dL — ABNORMAL LOW (ref 12.0–15.0)
MCH: 25.4 pg — AB (ref 26.0–34.0)
MCH: 25.5 pg — AB (ref 26.0–34.0)
MCHC: 30.6 g/dL (ref 30.0–36.0)
MCHC: 30.6 g/dL (ref 30.0–36.0)
MCV: 83.3 fL (ref 78.0–100.0)
MCV: 83.4 fL (ref 78.0–100.0)
PLATELETS: 206 10*3/uL (ref 150–400)
PLATELETS: 206 10*3/uL (ref 150–400)
RBC: 4.48 MIL/uL (ref 3.87–5.11)
RBC: 4.63 MIL/uL (ref 3.87–5.11)
RDW: 17.9 % — AB (ref 11.5–15.5)
RDW: 18 % — AB (ref 11.5–15.5)
WBC: 5.2 10*3/uL (ref 4.0–10.5)
WBC: 5.7 10*3/uL (ref 4.0–10.5)

## 2017-11-29 LAB — TSH: TSH: 0.683 u[IU]/mL (ref 0.350–4.500)

## 2017-11-29 LAB — HEMOGLOBIN A1C
Hgb A1c MFr Bld: 6.5 % — ABNORMAL HIGH (ref 4.8–5.6)
Mean Plasma Glucose: 139.85 mg/dL

## 2017-11-29 LAB — TROPONIN I
Troponin I: <0.03 ng/mL (ref <0.03)
Troponin I: <0.03 ng/mL (ref <0.03)

## 2017-11-29 LAB — PROTIME-INR
INR: 1.07
PROTHROMBIN TIME: 13.8 s (ref 11.4–15.2)

## 2017-11-29 LAB — HEPARIN LEVEL (UNFRACTIONATED)
HEPARIN UNFRACTIONATED: 0.44 [IU]/mL (ref 0.30–0.70)
HEPARIN UNFRACTIONATED: 0.34 [IU]/mL (ref 0.30–0.70)

## 2017-11-29 SURGERY — LEFT HEART CATH AND CORONARY ANGIOGRAPHY
Anesthesia: LOCAL

## 2017-11-29 MED ORDER — LIDOCAINE HCL (PF) 1 % IJ SOLN
INTRAMUSCULAR | Status: AC
  Filled 2017-11-29: qty 30

## 2017-11-29 MED ORDER — HEPARIN SODIUM (PORCINE) 1000 UNIT/ML IJ SOLN
INTRAMUSCULAR | Status: DC | PRN
  Administered 2017-11-29: 4000 [IU] via INTRAVENOUS

## 2017-11-29 MED ORDER — FENTANYL CITRATE (PF) 100 MCG/2ML IJ SOLN
INTRAMUSCULAR | Status: DC | PRN
  Administered 2017-11-29: 25 ug via INTRAVENOUS

## 2017-11-29 MED ORDER — SODIUM CHLORIDE 0.9% FLUSH: 3.0000 mL | INTRAVENOUS | Status: DC | PRN

## 2017-11-29 MED ORDER — SODIUM CHLORIDE 0.9 % IV SOLN: 250.0000 mL | INTRAVENOUS | Status: DC | PRN

## 2017-11-29 MED ORDER — TRAZODONE HCL 50 MG PO TABS
100.0000 mg | ORAL_TABLET | Freq: Every day | ORAL | Status: DC
  Administered 2017-11-29 (×2): 100 mg via ORAL
  Filled 2017-11-29 (×2): qty 2

## 2017-11-29 MED ORDER — HEPARIN SODIUM (PORCINE) 5000 UNIT/ML IJ SOLN: 5000.0000 [IU] | Freq: Three times a day (TID) | INTRAMUSCULAR | Status: DC

## 2017-11-29 MED ORDER — SODIUM CHLORIDE 0.9% FLUSH: 3.0000 mL | Freq: Two times a day (BID) | INTRAVENOUS | Status: DC

## 2017-11-29 MED ORDER — SODIUM CHLORIDE 0.9 % IV SOLN
INTRAVENOUS | Status: AC
  Administered 2017-11-29: 18:00:00 via INTRAVENOUS

## 2017-11-29 MED ORDER — HYDRALAZINE HCL 20 MG/ML IJ SOLN
INTRAMUSCULAR | Status: AC
  Filled 2017-11-29: qty 1

## 2017-11-29 MED ORDER — IOHEXOL 350 MG/ML SOLN
INTRAVENOUS | Status: DC | PRN
  Administered 2017-11-29: 75 mL via INTRACARDIAC

## 2017-11-29 MED ORDER — HEPARIN (PORCINE) IN NACL 1000-0.9 UT/500ML-% IV SOLN
INTRAVENOUS | Status: DC | PRN
  Administered 2017-11-29 (×2): 500 mL

## 2017-11-29 MED ORDER — FENTANYL CITRATE (PF) 100 MCG/2ML IJ SOLN
INTRAMUSCULAR | Status: AC
  Filled 2017-11-29: qty 2

## 2017-11-29 MED ORDER — MIDAZOLAM HCL 2 MG/2ML IJ SOLN
INTRAMUSCULAR | Status: AC
  Filled 2017-11-29: qty 2

## 2017-11-29 MED ORDER — ASPIRIN 81 MG PO CHEW: 81.0000 mg | CHEWABLE_TABLET | ORAL | Status: DC

## 2017-11-29 MED ORDER — SODIUM CHLORIDE 0.9 % IV SOLN: INTRAVENOUS | Status: DC

## 2017-11-29 MED ORDER — HYDRALAZINE HCL 20 MG/ML IJ SOLN
INTRAMUSCULAR | Status: DC | PRN
  Administered 2017-11-29: 10 mg via INTRAVENOUS

## 2017-11-29 MED ORDER — ACETAMINOPHEN 325 MG PO TABS
650.0000 mg | ORAL_TABLET | ORAL | Status: DC | PRN
  Administered 2017-11-29: 650 mg via ORAL
  Filled 2017-11-29: qty 2

## 2017-11-29 MED ORDER — SODIUM CHLORIDE 0.9% FLUSH
3.0000 mL | Freq: Two times a day (BID) | INTRAVENOUS | Status: DC
  Administered 2017-11-29: 3 mL via INTRAVENOUS

## 2017-11-29 MED ORDER — HEPARIN (PORCINE) IN NACL 1000-0.9 UT/500ML-% IV SOLN
INTRAVENOUS | Status: AC
  Filled 2017-11-29: qty 1000

## 2017-11-29 MED ORDER — LIDOCAINE HCL (PF) 1 % IJ SOLN
INTRAMUSCULAR | Status: DC | PRN
  Administered 2017-11-29: 2 mL

## 2017-11-29 MED ORDER — VERAPAMIL HCL 2.5 MG/ML IV SOLN
INTRAVENOUS | Status: DC | PRN
  Administered 2017-11-29: 17:00:00 via INTRA_ARTERIAL

## 2017-11-29 MED ORDER — CLOPIDOGREL BISULFATE 75 MG PO TABS: 75.0000 mg | ORAL_TABLET | ORAL | Status: DC

## 2017-11-29 MED ORDER — ONDANSETRON HCL 4 MG/2ML IJ SOLN: 4.0000 mg | Freq: Four times a day (QID) | INTRAMUSCULAR | Status: DC | PRN

## 2017-11-29 MED ORDER — VERAPAMIL HCL 2.5 MG/ML IV SOLN
INTRAVENOUS | Status: AC
  Filled 2017-11-29: qty 2

## 2017-11-29 MED ORDER — MIDAZOLAM HCL 2 MG/2ML IJ SOLN
INTRAMUSCULAR | Status: DC | PRN
  Administered 2017-11-29: 1 mg via INTRAVENOUS

## 2017-11-29 MED ORDER — HEPARIN SODIUM (PORCINE) 1000 UNIT/ML IJ SOLN
INTRAMUSCULAR | Status: AC
  Filled 2017-11-29: qty 1

## 2017-11-29 SURGICAL SUPPLY — 12 items
CATH INFINITI JR4 5F (CATHETERS) ×1 IMPLANT
CATH LAUNCHER 5F EBU3.0 (CATHETERS) IMPLANT
CATHETER LAUNCHER 5F EBU3.0 (CATHETERS) ×2
DEVICE RAD COMP TR BAND LRG (VASCULAR PRODUCTS) ×1 IMPLANT
GLIDESHEATH SLEND A-KIT 6F 22G (SHEATH) ×1 IMPLANT
GUIDEWIRE INQWIRE 1.5J.035X260 (WIRE) IMPLANT
INQWIRE 1.5J .035X260CM (WIRE) ×2
KIT HEART LEFT (KITS) ×2 IMPLANT
PACK CARDIAC CATHETERIZATION (CUSTOM PROCEDURE TRAY) ×2 IMPLANT
SHEATH PROBE COVER 6X72 (BAG) ×1 IMPLANT
TRANSDUCER W/STOPCOCK (MISCELLANEOUS) ×2 IMPLANT
TUBING CIL FLEX 10 FLL-RA (TUBING) ×2 IMPLANT

## 2017-11-29 NOTE — Progress Notes (Signed)
[place] for heparin Indication: chest pain/ACS  Allergies  Allergen Reactions  . Cortisone Anaphylaxis    Some forms of cortisone  . Demerol [Meperidine] Anaphylaxis  . Monosodium Glutamate Anaphylaxis  . Morphine And Related Anaphylaxis, Nausea And Vomiting and Other (See Comments)  . Prednisone Anaphylaxis    confusion  . Shellfish Allergy Anaphylaxis  . Sulfa Antibiotics Anaphylaxis  . Buprenorphine Hcl Nausea And Vomiting and Hypertension    SHAKING  . Readi-Cat [Barium Sulfate] Diarrhea    Patient does not want to drink Readi-Cat because it gives her diarrhea  . Xanax [Alprazolam] Other (See Comments)    "makes me crazy"  . Benzoic Acid Nausea Only    Patient Measurements: TBW 69.1 kg Heparin Dosing Weight: 69.1 kg  Vital Signs: Temp: 98 F (36.7 C) (07/21 2012) Temp Source: Oral (07/21 2012) BP: 145/69 (07/21 2012) Pulse Rate: 77 (07/21 2012)  Labs: Recent Labs    11/28/17 1138 11/28/17 1659 11/29/17 0028  HGB 11.5*  --   --   HCT 37.8  --   --   PLT 239  --   --   HEPARINUNFRC  --   --  0.34  CREATININE 0.98  --   --   TROPONINI <0.03 <0.03 <0.03    Estimated Creatinine Clearance: 49.3 mL/min (by C-G formula based on SCr of 0.98 mg/dL).  Assessment: 72 y.o. female with chest pain for heparin   Goal of Therapy:  Heparin level 0.3-0.7 units/ml Monitor platelets by anticoagulation protocol: Yes   Plan:  Continue Heparin at current rate   Chayanne Speir, Bronson Curb 11/29/2017,1:33 AM

## 2017-11-29 NOTE — Progress Notes (Signed)
ANTICOAGULATION CONSULT NOTE Pharmacy Consult for heparin Indication: chest pain/ACS  Patient Measurements: TBW 69.1 kg Heparin Dosing Weight: 69.1 kg  Vital Signs: Temp: 97.5 F (36.4 C) (07/22 0733) Temp Source: Axillary (07/22 0733) BP: 156/80 (07/22 0733) Pulse Rate: 69 (07/22 0733)  Labs: Recent Labs    11/28/17 1138 11/28/17 1659 11/29/17 0028 11/29/17 0736  HGB 11.5*  --   --  11.4*  HCT 37.8  --   --  37.3  PLT 239  --   --  206  HEPARINUNFRC  --   --  0.34 0.44  CREATININE 0.98  --   --  0.78  TROPONINI <0.03 <0.03 <0.03 <0.03    Assessment: 72 y.o. female admitted due to chest pain. Remains on heparin, level is therapeutic this morning. She has a hx of CAD, she had a cardiac cath in 2018 which resulted in a DES in her mid-LAD at that time. SCr and h/h, plts wnl.   Goal of Therapy:  Heparin level 0.3-0.7 units/ml Monitor platelets by anticoagulation protocol: Yes   Plan:  Continue heparin at 850 units/hr Daily HL, CBC F/u plans for cath   Harvel Quale 11/29/2017,9:14 AM

## 2017-11-29 NOTE — Progress Notes (Signed)
Patient developed slight (level 1) hematoma at right radial cath site, 2cc air added to TR band (17cc). Will monitor for additional 30 minutes and deflate if possible.

## 2017-11-29 NOTE — Progress Notes (Signed)
Progress Note  Patient Name: Caitlyn [name] Date of Encounter: 11/29/2017  Primary Cardiologist: Quay Burow, MD   Subjective   Ms. Caitlyn [name] was admitted on 11/28/2017 with unstable angina.  She currently has had off-and-on chest pain on IV heparin.  She has known CAD status post LAD intervention as recently as 4/19 by Dr. Angelena Form.  Her enzymes are negative.  She is awaiting cardiac catheterization today.  Inpatient Medications    Scheduled Meds: . aspirin  81 mg Oral Daily  . carvedilol  25 mg Oral BID WC  . clopidogrel  75 mg Oral Q breakfast  . fenofibrate  160 mg Oral Daily  . levothyroxine  88 mcg Oral QAC breakfast  . lipase/protease/amylase  36,000 Units Oral TID WC  . losartan  50 mg Oral Daily  . rosuvastatin  20 mg Oral q1800  . sodium chloride flush  3 mL Intravenous Q12H  . traZODone  100 mg Oral QHS  . venlafaxine XR  150-300 mg Oral Daily   Continuous Infusions: . sodium chloride    . sodium chloride 1 mL/kg/hr (11/29/17 0800)  . heparin 850 Units/hr (11/29/17 0800)   PRN Meds: sodium chloride, hydrALAZINE, methocarbamol, nitroGLYCERIN, sodium chloride flush   Vital Signs    Vitals:   11/28/17 2012 11/28/17 2345 11/29/17 0630 11/29/17 0733  BP: (!) 145/69 (!) 155/79 (!) 180/88 (!) 156/80  Pulse: 77 70  69  Resp: 19 20  16   Temp: 98 F (36.7 C) 98.7 F (37.1 C)  (!) 97.5 F (36.4 C)  TempSrc: Oral   Axillary  SpO2: 96% 95%  96%  Weight:      Height:        Intake/Output Summary (Last 24 hours) at 11/29/2017 1026 Last data filed at 11/29/2017 0800 Gross per 24 hour  Intake 932.07 ml  Output 300 ml  Net 632.07 ml   Filed Weights   11/28/17 1700  Weight: 153 lb 1.6 oz (69.4 kg)    Telemetry    Normal sinus rhythm- Personally Reviewed  ECG    Not performed today- Personally Reviewed  Physical Exam   GEN: No acute distress.   Neck: No JVD Cardiac: RRR, no murmurs, rubs, or gallops.  Respiratory: Clear to auscultation bilaterally. GI:  Soft, nontender, non-distended  MS: No edema; No deformity. Neuro:  Nonfocal  Psych: Normal affect   Labs    Chemistry Recent Labs  Lab 11/28/17 1138 11/29/17 0736  NA 137 143  K 5.6* 3.5  CL 105 109  CO2 20* 25  GLUCOSE 172* 159*  BUN 18 12  CREATININE 0.98 0.78  CALCIUM 9.3 9.0  PROT 6.2*  --   ALBUMIN 3.6  --   AST 48*  --   ALT 18  --   ALKPHOS 37*  --   BILITOT 1.3*  --   GFRNONAA 55* >60  GFRAA >60 >60  ANIONGAP 12 9     Hematology Recent Labs  Lab 11/28/17 1138 11/29/17 0736  WBC 6.1 5.2  RBC 4.50 4.48  HGB 11.5* 11.4*  HCT 37.8 37.3  MCV 84.0 83.3  MCH 25.6* 25.4*  MCHC 30.4 30.6  RDW 18.0* 17.9*  PLT 239 206    Cardiac Enzymes Recent Labs  Lab 11/28/17 1138 11/28/17 1659 11/29/17 0028 11/29/17 0736  TROPONINI <0.03 <0.03 <0.03 <0.03    Recent Labs  Lab 11/28/17 1144  TROPIPOC 0.01     BNPNo results for input(s): BNP, PROBNP in the last 168 hours.  DDimer No results for input(s): DDIMER in the last 168 hours.   Radiology    Dg Chest 2 View  Result Date: 11/28/2017 CLINICAL DATA:  Pt states prior to arrival she felt diaphoretic and nauseated. Pt also endorses when she walked to front of church, she felt dizzy and had a moment of syncope. EXAM: CHEST - 2 VIEW COMPARISON:  Is 08/13/2017 FINDINGS: Normal cardiac silhouette. Large hiatal hernia. Lungs are clear. No effusion, infiltrate pneumothorax. IMPRESSION: 1.  No acute cardiopulmonary process. 2. Large hiatal hernia. Electronically Signed   By: Suzy Bouchard M.D.   On: 11/28/2017 13:21    Cardiac Studies   None  Patient Profile     72 y.o. female with a history of hypertension hyperlipidemia who had LAD stenting by myself 05/2016 with repeat intervention 08/2017 by Dr. Angelena Form of her proximal and mid LAD.  She was admitted with unstable angina.  Her enzymes have been negative.  She is on IV heparin.  She is scheduled to have cardiac catheterization today.  Assessment & Plan      1: CAD- admitted with unstable angina with negative enzymes.  I performed mid LAD stenting 05/2016 as she had proximal and mid LAD stenting by Dr. Angelena Form 08/2017 with some moderate circumflex and obtuse marginal branch disease at that time treated medically.  She is had chest pain off and on for the last week worse over the weekend.  She is on aspirin Plavix.  She will need diagnostic coronary angiography today.The patient understands that risks included but are not limited to stroke (1 in 1000), death (1 in 34), kidney failure [usually temporary] (1 in 500), bleeding (1 in 200), allergic reaction [possibly serious] (1 in 200). The patient understands and agrees to proceed   2: Essential hypertension- controlled on current medications  3: Hyperlipidemia- Crestor was restarted.       For questions or updates, please contact [place] Please consult [email or web address] for contact info under Cardiology/STEMI.      Signed, Quay Burow, MD  11/29/2017, 10:26 AM

## 2017-11-29 NOTE — H&P (View-Only) (Signed)
Progress Note  Patient Name: Caitlyn [name] Date of Encounter: 11/29/2017  Primary Cardiologist: Quay Burow, MD   Subjective   Caitlyn [name] was admitted on 11/28/2017 with unstable angina.  She currently has had off-and-on chest pain on IV heparin.  She has known CAD status post LAD intervention as recently as 4/19 by Dr. Angelena Form.  Her enzymes are negative.  She is awaiting cardiac catheterization today.  Inpatient Medications    Scheduled Meds: . aspirin  81 mg Oral Daily  . carvedilol  25 mg Oral BID WC  . clopidogrel  75 mg Oral Q breakfast  . fenofibrate  160 mg Oral Daily  . levothyroxine  88 mcg Oral QAC breakfast  . lipase/protease/amylase  36,000 Units Oral TID WC  . losartan  50 mg Oral Daily  . rosuvastatin  20 mg Oral q1800  . sodium chloride flush  3 mL Intravenous Q12H  . traZODone  100 mg Oral QHS  . venlafaxine XR  150-300 mg Oral Daily   Continuous Infusions: . sodium chloride    . sodium chloride 1 mL/kg/hr (11/29/17 0800)  . heparin 850 Units/hr (11/29/17 0800)   PRN Meds: sodium chloride, hydrALAZINE, methocarbamol, nitroGLYCERIN, sodium chloride flush   Vital Signs    Vitals:   11/28/17 2012 11/28/17 2345 11/29/17 0630 11/29/17 0733  BP: (!) 145/69 (!) 155/79 (!) 180/88 (!) 156/80  Pulse: 77 70  69  Resp: 19 20  16   Temp: 98 F (36.7 C) 98.7 F (37.1 C)  (!) 97.5 F (36.4 C)  TempSrc: Oral   Axillary  SpO2: 96% 95%  96%  Weight:      Height:        Intake/Output Summary (Last 24 hours) at 11/29/2017 1026 Last data filed at 11/29/2017 0800 Gross per 24 hour  Intake 932.07 ml  Output 300 ml  Net 632.07 ml   Filed Weights   11/28/17 1700  Weight: 153 lb 1.6 oz (69.4 kg)    Telemetry    Normal sinus rhythm- Personally Reviewed  ECG    Not performed today- Personally Reviewed  Physical Exam   GEN: No acute distress.   Neck: No JVD Cardiac: RRR, no murmurs, rubs, or gallops.  Respiratory: Clear to auscultation bilaterally. GI:  Soft, nontender, non-distended  MS: No edema; No deformity. Neuro:  Nonfocal  Psych: Normal affect   Labs    Chemistry Recent Labs  Lab 11/28/17 1138 11/29/17 0736  NA 137 143  K 5.6* 3.5  CL 105 109  CO2 20* 25  GLUCOSE 172* 159*  BUN 18 12  CREATININE 0.98 0.78  CALCIUM 9.3 9.0  PROT 6.2*  --   ALBUMIN 3.6  --   AST 48*  --   ALT 18  --   ALKPHOS 37*  --   BILITOT 1.3*  --   GFRNONAA 55* >60  GFRAA >60 >60  ANIONGAP 12 9     Hematology Recent Labs  Lab 11/28/17 1138 11/29/17 0736  WBC 6.1 5.2  RBC 4.50 4.48  HGB 11.5* 11.4*  HCT 37.8 37.3  MCV 84.0 83.3  MCH 25.6* 25.4*  MCHC 30.4 30.6  RDW 18.0* 17.9*  PLT 239 206    Cardiac Enzymes Recent Labs  Lab 11/28/17 1138 11/28/17 1659 11/29/17 0028 11/29/17 0736  TROPONINI <0.03 <0.03 <0.03 <0.03    Recent Labs  Lab 11/28/17 1144  TROPIPOC 0.01     BNPNo results for input(s): BNP, PROBNP in the last 168 hours.  DDimer No results for input(s): DDIMER in the last 168 hours.   Radiology    Dg Chest 2 View  Result Date: 11/28/2017 CLINICAL DATA:  Pt states prior to arrival she felt diaphoretic and nauseated. Pt also endorses when she walked to front of church, she felt dizzy and had a moment of syncope. EXAM: CHEST - 2 VIEW COMPARISON:  Is 08/13/2017 FINDINGS: Normal cardiac silhouette. Large hiatal hernia. Lungs are clear. No effusion, infiltrate pneumothorax. IMPRESSION: 1.  No acute cardiopulmonary process. 2. Large hiatal hernia. Electronically Signed   By: Suzy Bouchard M.D.   On: 11/28/2017 13:21    Cardiac Studies   None  Patient Profile     72 y.o. female with a history of hypertension hyperlipidemia who had LAD stenting by myself 05/2016 with repeat intervention 08/2017 by Dr. Angelena Form of her proximal and mid LAD.  She was admitted with unstable angina.  Her enzymes have been negative.  She is on IV heparin.  She is scheduled to have cardiac catheterization today.  Assessment & Plan      1: CAD- admitted with unstable angina with negative enzymes.  I performed mid LAD stenting 05/2016 as she had proximal and mid LAD stenting by Dr. Angelena Form 08/2017 with some moderate circumflex and obtuse marginal branch disease at that time treated medically.  She is had chest pain off and on for the last week worse over the weekend.  She is on aspirin Plavix.  She will need diagnostic coronary angiography today.The patient understands that risks included but are not limited to stroke (1 in 1000), death (1 in 78), kidney failure [usually temporary] (1 in 500), bleeding (1 in 200), allergic reaction [possibly serious] (1 in 200). The patient understands and agrees to proceed   2: Essential hypertension- controlled on current medications  3: Hyperlipidemia- Crestor was restarted.       For questions or updates, please contact [place] Please consult [email or web address] for contact info under Cardiology/STEMI.      Signed, Quay Burow, MD  11/29/2017, 10:26 AM

## 2017-11-29 NOTE — Interval H&P Note (Signed)
Cath Lab Visit (complete for each Cath Lab visit)  Clinical Evaluation Leading to the Procedure:   ACS: Yes.    Non-ACS:    Anginal Classification: CCS III  Anti-ischemic medical therapy: Minimal Therapy (1 class of medications)  Non-Invasive Test Results: No non-invasive testing performed  Prior CABG: No previous CABG      History and Physical Interval Note:  11/29/2017 4:27 PM  Caitlyn [name]  has presented today for surgery, with the diagnosis of UA  The various methods of treatment have been discussed with the patient and family. After consideration of risks, benefits and other options for treatment, the patient has consented to  Procedure(s): LEFT HEART CATH AND CORONARY ANGIOGRAPHY (N/A) as a surgical intervention .  The patient's history has been reviewed, patient examined, no change in status, stable for surgery.  I have reviewed the patient's chart and labs.  Questions were answered to the patient's satisfaction.     Belva Crome III

## 2017-11-30 ENCOUNTER — Ambulatory Visit (HOSPITAL_COMMUNITY): Payer: PPO

## 2017-11-30 ENCOUNTER — Telehealth: Payer: Self-pay | Admitting: Cardiovascular Disease

## 2017-11-30 ENCOUNTER — Other Ambulatory Visit: Payer: Self-pay | Admitting: Medical

## 2017-11-30 ENCOUNTER — Encounter (HOSPITAL_COMMUNITY): Payer: Self-pay

## 2017-11-30 DIAGNOSIS — E785 Hyperlipidemia, unspecified: Secondary | ICD-10-CM | POA: Diagnosis not present

## 2017-11-30 DIAGNOSIS — I200 Unstable angina: Secondary | ICD-10-CM

## 2017-11-30 DIAGNOSIS — Z9861 Coronary angioplasty status: Secondary | ICD-10-CM

## 2017-11-30 DIAGNOSIS — I2510 Atherosclerotic heart disease of native coronary artery without angina pectoris: Secondary | ICD-10-CM

## 2017-11-30 DIAGNOSIS — I10 Essential (primary) hypertension: Secondary | ICD-10-CM | POA: Diagnosis not present

## 2017-11-30 LAB — CBC
HEMATOCRIT: 35.1 % — AB (ref 36.0–46.0)
HEMOGLOBIN: 10.6 g/dL — AB (ref 12.0–15.0)
MCH: 25.5 pg — ABNORMAL LOW (ref 26.0–34.0)
MCHC: 30.2 g/dL (ref 30.0–36.0)
MCV: 84.6 fL (ref 78.0–100.0)
Platelets: 204 10*3/uL (ref 150–400)
RBC: 4.15 MIL/uL (ref 3.87–5.11)
RDW: 18.5 % — AB (ref 11.5–15.5)
WBC: 5.5 10*3/uL (ref 4.0–10.5)

## 2017-11-30 LAB — BASIC METABOLIC PANEL
ANION GAP: 9 (ref 5–15)
BUN: 23 mg/dL (ref 8–23)
CALCIUM: 9 mg/dL (ref 8.9–10.3)
CO2: 23 mmol/L (ref 22–32)
Chloride: 107 mmol/L (ref 98–111)
Creatinine, Ser: 0.86 mg/dL (ref 0.44–1.00)
GFR calc Af Amer: >60 mL/min (ref >60)
GFR calc non Af Amer: >60 mL/min (ref >60)
GLUCOSE: 158 mg/dL — AB (ref 70–99)
Potassium: 3.7 mmol/L (ref 3.5–5.1)
Sodium: 139 mmol/L (ref 135–145)

## 2017-11-30 MED ORDER — ROSUVASTATIN CALCIUM 20 MG PO TABS: 20.0000 mg | ORAL_TABLET | Freq: Every day | ORAL | 3 refills | Status: DC

## 2017-11-30 MED FILL — Hydralazine HCl Inj 20 MG/ML: INTRAMUSCULAR | Qty: 1 | Status: AC

## 2017-11-30 NOTE — Telephone Encounter (Signed)
New Message:      TOC with [place] on 12/09/17 at 8:30 per Daleen Snook

## 2017-11-30 NOTE — Progress Notes (Signed)
myo

## 2017-11-30 NOTE — Discharge Instructions (Signed)
PLEASE REMEMBER TO BRING ALL OF YOUR MEDICATIONS TO EACH OF YOUR FOLLOW-UP OFFICE VISITS.  PLEASE ATTEND ALL SCHEDULED FOLLOW-UP APPOINTMENTS.   Activity: Increase activity slowly as tolerated. You may shower, but no soaking baths (or swimming) for 1 week. No driving for 24 hours. No lifting over 5 lbs for 1 week. No sexual activity for 1 week.   You May Return to Work: in 1 week (if applicable)  Wound Care: You may wash cath site gently with soap and water. Keep cath site clean and dry. If you notice pain, swelling, bleeding or pus at your cath site, please call [phone].       You have a Stress Test scheduled at [facility]. Your doctor has ordered this test to check the blood flow in your heart arteries.  Please arrive 15 minutes early for paperwork. The whole test will take several hours. You may want to bring reading material to remain occupied while undergoing different parts of the test.  Instructions:  No food/drink after midnight the night before.  It is OK to take your morning meds with a sip of water EXCEPT for those types of medicines listed below or otherwise instructed.  No caffeine/decaf products 24 hours before, including medicines such as Excedrin or Goody Powders. Call if there are any questions.   Wear comfortable clothes and shoes.   Special Medication Instructions:  Beta blockers such as metoprolol (Lopressor/Toprol XL), atenolol (Tenormin), carvedilol (Coreg), nebivolol (Bystolic), bisoprolol (Zebeta), propranolol (Inderal) should not be taken for 24 hours before the test.  Calcium channel blockers such as diltiazem (Cardizem) or verapmil (Calan) should not be taken for 24 hours before the test.  Remove nitroglycerin patches and do not take nitrate preparations such as Imdur/isosorbide the day of your test.  No Persantine/Theophylline or Aggrenox medicines should be used within 24 hours of the test.   If you are diabetic, please  ask which medications to hold the day of the test.    What To Expect: When you arrive in the lab, the technician will inject a small amount of radioactive tracer into your arm through an IV while you are resting quietly. This helps [place] to form pictures of your heart. You will likely only feel a sting from the IV. After a waiting period, resting pictures will be obtained under a big camera. These are the "before" pictures.  Next, you will be prepped for the stress portion of the test. This may include either walking on a treadmill or receiving a medicine that helps to dilate blood vessels in your heart to simulate the effect of exercise on your heart. If you are walking on a treadmill, you will walk at different paces to try to get your heart rate to a goal number that is based on your age. If your doctor has chosen the pharmacologic test, then you will receive a medicine through your IV that may cause temporary nausea, flushing, shortness of breath and sometimes chest discomfort or vomiting. This is typically short-lived and usually resolves quickly. If you experience symptoms, that does not automatically mean the test is abnormal. Some patients do not experience any symptoms at all. Your blood pressure and heart rate will be monitored, and we will be watching your EKG on a computer screen for any changes. During this portion of the test, the radiologist will inject another small amount of radioactive tracer into your IV. After a waiting period, you will undergo a second set of pictures. These  are the "after" pictures.  The doctor reading the test will compare the before-and-after images to look for evidence of heart blockages or heart weakness. The test usually takes 1 day to complete, but in certain instances (for example, if a patient is over a certain weight limit), the test may be done over the span of 2 days.

## 2017-11-30 NOTE — Progress Notes (Addendum)
TR-band removed from right radial site, slight bruising to the area no drainage observed form site pressure dressing applied.  No discomfort voiced by patient at this time.  Will continue to monitor.

## 2017-11-30 NOTE — Progress Notes (Signed)
Progress Note  Patient Name: Caitlyn [name] Date of Encounter: 11/30/2017  Primary Cardiologist: Quay Burow, MD   Subjective   Ms. Caitlyn [name] was admitted on 11/28/2017 with unstable angina.  She has known CAD status post LAD intervention as recently as 4/19 by Dr. Angelena Form.  Her enzymes were negative.  She underwent cardiac catheterization with Dr. Tamala Julian yesterday revealing widely patent LAD stents and no other significant CAD with normal LV function.  She has a small bruise on her right forearm but her radial puncture site appears stable.  Inpatient Medications    Scheduled Meds: . aspirin  81 mg Oral Daily  . carvedilol  25 mg Oral BID WC  . clopidogrel  75 mg Oral Q breakfast  . fenofibrate  160 mg Oral Daily  . heparin  5,000 Units Subcutaneous Q8H  . levothyroxine  88 mcg Oral QAC breakfast  . lipase/protease/amylase  36,000 Units Oral TID WC  . losartan  50 mg Oral Daily  . rosuvastatin  20 mg Oral q1800  . sodium chloride flush  3 mL Intravenous Q12H  . traZODone  100 mg Oral QHS  . venlafaxine XR  150-300 mg Oral Daily   Continuous Infusions: . sodium chloride     PRN Meds: sodium chloride, acetaminophen, hydrALAZINE, methocarbamol, nitroGLYCERIN, ondansetron (ZOFRAN) IV, sodium chloride flush   Vital Signs    Vitals:   11/29/17 2042 11/29/17 2309 11/30/17 0328 11/30/17 0726  BP: 119/64 (!) 124/56 129/86 139/77  Pulse: 84 83 77 73  Resp: 18 (!) 24 19 15   Temp: 98.3 F (36.8 C) 98.2 F (36.8 C) 97.7 F (36.5 C) 97.9 F (36.6 C)  TempSrc: Oral Oral Oral Oral  SpO2: 94% 96% 94% 96%  Weight:      Height:        Intake/Output Summary (Last 24 hours) at 11/30/2017 0824 Last data filed at 11/29/2017 2055 Gross per 24 hour  Intake 360 ml  Output -  Net 360 ml   Filed Weights   11/28/17 1700  Weight: 153 lb 1.6 oz (69.4 kg)    Telemetry    Normal sinus rhythm- Personally Reviewed  ECG    Not performed today- Personally Reviewed  Physical Exam    GEN: No acute distress.   Neck: No JVD Cardiac: RRR, no murmurs, rubs, or gallops.  Respiratory: Clear to auscultation bilaterally. GI: Soft, nontender, non-distended  MS: No edema; No deformity. Neuro:  Nonfocal  Psych: Normal affect   Labs    Chemistry Recent Labs  Lab 11/28/17 1138 11/29/17 0736 11/29/17 1223 11/30/17 0539  NA 137 143 140 139  K 5.6* 3.5 3.7 3.7  CL 105 109 106 107  CO2 20* 25 23 23   GLUCOSE 172* 159* 136* 158*  BUN 18 12 10 23   CREATININE 0.98 0.78 0.67 0.86  CALCIUM 9.3 9.0 9.1 9.0  PROT 6.2*  --   --   --   ALBUMIN 3.6  --   --   --   AST 48*  --   --   --   ALT 18  --   --   --   ALKPHOS 37*  --   --   --   BILITOT 1.3*  --   --   --   GFRNONAA 55* >60 >60 >60  GFRAA >60 >60 >60 >60  ANIONGAP 12 9 11 9      Hematology Recent Labs  Lab 11/29/17 0736 11/29/17 1223 11/30/17 0539  WBC 5.2 5.7 5.5  RBC 4.48 4.63 4.15  HGB 11.4* 11.8* 10.6*  HCT 37.3 38.6 35.1*  MCV 83.3 83.4 84.6  MCH 25.4* 25.5* 25.5*  MCHC 30.6 30.6 30.2  RDW 17.9* 18.0* 18.5*  PLT 206 206 204    Cardiac Enzymes Recent Labs  Lab 11/28/17 1138 11/28/17 1659 11/29/17 0028 11/29/17 0736  TROPONINI <0.03 <0.03 <0.03 <0.03    Recent Labs  Lab 11/28/17 1144  TROPIPOC 0.01     BNPNo results for input(s): BNP, PROBNP in the last 168 hours.   DDimer No results for input(s): DDIMER in the last 168 hours.   Radiology    Dg Chest 2 View  Result Date: 11/28/2017 CLINICAL DATA:  Pt states prior to arrival she felt diaphoretic and nauseated. Pt also endorses when she walked to front of church, she felt dizzy and had a moment of syncope. EXAM: CHEST - 2 VIEW COMPARISON:  Is 08/13/2017 FINDINGS: Normal cardiac silhouette. Large hiatal hernia. Lungs are clear. No effusion, infiltrate pneumothorax. IMPRESSION: 1.  No acute cardiopulmonary process. 2. Large hiatal hernia. Electronically Signed   By: Suzy Bouchard M.D.   On: 11/28/2017 13:21    Cardiac Studies    None  Patient Profile     72 y.o. female with a history of hypertension hyperlipidemia who had LAD stenting by myself 05/2016 with repeat intervention 08/2017 by Dr. Angelena Form of her proximal and mid LAD.  She was admitted with unstable angina.  Her enzymes have been negative.  She underwent cardiac catheterization yesterday by Dr. Tamala Julian via the right radial approach revealing widely patent LAD stents and no other significant CAD with normal LV function.  Assessment & Plan    1: CAD- admitted with unstable angina with negative enzymes.  I performed mid LAD stenting 05/2016 as she had proximal and mid LAD stenting by Dr. Angelena Form 08/2017 with some moderate circumflex and obtuse marginal branch disease at that time treated medically.  She underwent cardiac catheterization yesterday by Dr. Tamala Julian the right radial approach revealing widely patent LAD stent with no other significant CAD and normal LV function.  She does have a small bruise on her right forearm but her radial puncture site appears stable.  There remains no cardiovascular etiology for her chest pain.  She stable for discharge home today.  We will arrange outpatient follow-up with an APP within 7 days and with me within 4 to 6 weeks.  2: Essential hypertension- controlled on current medications  3: Hyperlipidemia- Crestor was restarted.       For questions or updates, please contact [place] Please consult [email or web address] for contact info under Cardiology/STEMI.      Signed, Quay Burow, MD  11/30/2017, 8:24 AM

## 2017-11-30 NOTE — Discharge Summary (Signed)
Discharge Summary    Patient ID: Caitlyn [name],  MRN: [id number], DOB/AGE: 74-Sep-1947 74 y.o.  Admit date: 11/28/2017 Discharge date: 11/30/2017  Primary Care Provider: Josetta Huddle Primary Cardiologist: Quay Burow, MD  Discharge Diagnoses    Principal Problem:   Unstable angina [facility]) Active Problems:   Essential hypertension   Dyslipidemia, goal LDL below 70   CAD S/P percutaneous coronary angioplasty   Allergies Allergies  Allergen Reactions  . Cortisone Anaphylaxis    Some forms of cortisone  . Demerol [Meperidine] Anaphylaxis  . Monosodium Glutamate Anaphylaxis  . Morphine And Related Anaphylaxis, Nausea And Vomiting and Other (See Comments)  . Prednisone Anaphylaxis    confusion  . Shellfish Allergy Anaphylaxis  . Sulfa Antibiotics Anaphylaxis  . Buprenorphine Hcl Nausea And Vomiting and Hypertension    SHAKING  . Readi-Cat [Barium Sulfate] Diarrhea    Patient does not want to drink Readi-Cat because it gives her diarrhea  . Xanax [Alprazolam] Other (See Comments)    "makes me crazy"  . Benzoic Acid Nausea Only    Diagnostic Studies/Procedures    Left heart catheterization 11/29/17:  Right dominant coronary anatomy.  Widely patent right coronary  Widely patent left main with superior angulation from the left sinus of Valsalva.  No obstructive disease.  Widely patent proximal and overlapping mid LAD stents.  No significant obstruction is noted in the LAD.  The jailed first diagonal contains proximal eccentric 50% narrowing and mid eccentric 60- 80% narrowing.  Diagonal disease is unchanged compared to the prior images.  Tortuous circumflex with 2 large obtuse marginal branches.  Each obtuse marginal contains proximal to mid segmental 50 to 60% narrowing followed by tortuosity.  Smaller third and fourth obtuse marginal branches are widely patent.  Normal left ventricular function with EF 55%.  Normal LV filling pressures with LVEDP meters  mercury.  RECOMMENDATIONS:   Widely patent LAD stents.  No significant change in anatomy compared to April post stent images.  The first diagonal contains diffuse disease and could be the source of angina.  This vessel is now jailed, contains disease over a long segment, and management should be medical therapy.  Consider alternative explanations for the patient's presenting symptoms.  Recommend uninterrupted dual antiplatelet therapy with Aspirin 81mg  daily and Clopidogrel 75mg  daily for a minimum of 12 months (ACS - Class I recommendation). _____________   History of Present Illness     72 yr old woman with a history of CAD s/p prior PCIs (05/2016 and 08/2017) to proximal and mid LAD, hypertension, hyperlipidemia. She presented with chest pain.  The patient's history is provided by herself and family members in the room. She endorsed that she hasn't "felt herself" for some time. In the last few days, she has had increasing fatigue and chest pain. On the evening of 11/27/17, she had chest pain and took a nitroglycerin with minimal relief. The pain lasted, and on the morning of 11/28/17 she had an episode at church with 10/10 chest pain, sharp, digging, midsternal, nonradiating associated with near syncope, nausea, diaphoresis, and shortness of breath. Of note, she has never had typical chest pain with her prior MI events. She came to ER, and thus far her troponins and ECG have been unremarkable. Her blood pressure was significantly elevated in the ER.  She denies recent illness. No fevers/chills, no cough. No PND, orthopnea, or LE edema. No melena or hematochezia, no hematuria.      Hospital Course     Consultants:  None   1. Unstable angina in patient with CAD s/p stents to mid/proximal LAD: patient presented with CP concerning for unstable angina. Troponins were negative x3. EKG was non-ischemic. Decision made to pursue LHC which occurred on 11/29/17 and revealed patent LAD stents and  no significant change in anatomy since 08/2017 cath. She was noted to have a jailed 1st diagnoal with 50% proximal narrowing and 60-80% mid vessel narrowing and 50-60% prox-mid OM narrowing. She was recommended to continue DAPT for 1 year from the time of last stent placement 08/2017. Dr. [name] recommended NST outpatient to further evaluate intermediate OM lesion.  - Continue aspirin, plavix, and statin   2. HTN: BP stable throughout admission on home regimen.  - continue coreg and losartan  3. HLD: ?compliance with crestor; restarted on crestor 20mg  daily - Continue statin _____________  Discharge Vitals Blood pressure (!) 146/84, pulse 73, temperature (!) 96.7 F (35.9 C), temperature source Axillary, resp. rate 15, height 5\' 5"  (1.651 m), weight 153 lb 1.6 oz (69.4 kg), SpO2 96 %.  Filed Weights   11/28/17 1700  Weight: 153 lb 1.6 oz (69.4 kg)    Labs & Radiologic Studies    CBC Recent Labs    11/28/17 1138  11/29/17 1223 11/30/17 0539  WBC 6.1   < > 5.7 5.5  NEUTROABS 4.0  --   --   --   HGB 11.5*   < > 11.8* 10.6*  HCT 37.8   < > 38.6 35.1*  MCV 84.0   < > 83.4 84.6  PLT 239   < > 206 204   < > = values in this interval not displayed.   Basic Metabolic Panel Recent Labs    11/29/17 1223 11/30/17 0539  NA 140 139  K 3.7 3.7  CL 106 107  CO2 23 23  GLUCOSE 136* 158*  BUN 10 23  CREATININE 0.67 0.86  CALCIUM 9.1 9.0   Liver Function Tests Recent Labs    11/28/17 1138  AST 48*  ALT 18  ALKPHOS 37*  BILITOT 1.3*  PROT 6.2*  ALBUMIN 3.6   No results for input(s): LIPASE, AMYLASE in the last 72 hours. Cardiac Enzymes Recent Labs    11/28/17 1659 11/29/17 0028 11/29/17 0736  TROPONINI <0.03 <0.03 <0.03   BNP Invalid input(s): POCBNP D-Dimer No results for input(s): DDIMER in the last 72 hours. Hemoglobin A1C Recent Labs    11/29/17 0736  HGBA1C 6.5*   Fasting Lipid Panel Recent Labs    11/29/17 0736  CHOL 149  HDL 49  LDLCALC 69  TRIG  154*  CHOLHDL 3.0   Thyroid Function Tests Recent Labs    11/29/17 0736  TSH 0.683   _____________  Dg Chest 2 View  Result Date: 11/28/2017 CLINICAL DATA:  Pt states prior to arrival she felt diaphoretic and nauseated. Pt also endorses when she walked to front of church, she felt dizzy and had a moment of syncope. EXAM: CHEST - 2 VIEW COMPARISON:  Is 08/13/2017 FINDINGS: Normal cardiac silhouette. Large hiatal hernia. Lungs are clear. No effusion, infiltrate pneumothorax. IMPRESSION: 1.  No acute cardiopulmonary process. 2. Large hiatal hernia. Electronically Signed   By: Suzy Bouchard M.D.   On: 11/28/2017 13:21   Disposition   Patient was seen and examined by Dr. [name] who deemed patient as stable for discharge. Follow-up has been arranged. Discharge medications as listed below.   Follow-up Plans & Appointments    Follow-up Information  Kerin Ransom K, PA-C Follow up on 12/09/2017.   Specialties:  Cardiology, Radiology Why:  Please arrive 15 minutes early for your 8:30am appointment Contact information: [address] [place] [address] [phone]        Lorretta Harp, MD Follow up on 01/04/2018.   Specialties:  Cardiology, Radiology Why:  Please arrive 15 minutes early for your 3:00pm appointment Contact information: [address] [place] [address] [phone]        [place] Follow up on 12/22/2017.   Specialty:  Cardiology Why:  Please arrive 15 minutes early for your 9:15am stress test appointment. Please do not eat or drink anything after midnight the night before your stress test. No caffeine for 24 hours prior to this test.  Contact information: [address] [phone]         Discharge Instructions    Diet - low sodium heart healthy   Complete by:  As directed    Increase activity slowly   Complete by:  As directed       Discharge  Medications   Allergies as of 11/30/2017      Reactions   Cortisone Anaphylaxis   Some forms of cortisone   Demerol [meperidine] Anaphylaxis   Monosodium Glutamate Anaphylaxis   Morphine And Related Anaphylaxis, Nausea And Vomiting, Other (See Comments)   Prednisone Anaphylaxis   confusion   Shellfish Allergy Anaphylaxis   Sulfa Antibiotics Anaphylaxis   Buprenorphine Hcl Nausea And Vomiting, Hypertension   SHAKING   Readi-cat [barium Sulfate] Diarrhea   Patient does not want to drink Readi-Cat because it gives her diarrhea   Xanax [alprazolam] Other (See Comments)   "makes me crazy"   Benzoic Acid Nausea Only      Medication List    TAKE these medications   ALBUTEROL SULFATE HFA IN Inhale 2 puffs into the lungs every 6 (six) hours as needed (shortness of breath/wheezing).   aspirin 81 MG EC tablet Take 1 tablet (81 mg total) by mouth daily.   Calcium-Vitamin D 600-400 MG-UNIT Tabs Take by mouth.   carvedilol 25 MG tablet Commonly known as:  COREG Take 1 tablet (25 mg total) by mouth 2 (two) times daily with a meal. What changed:    when to take this  additional instructions   clopidogrel 75 MG tablet Commonly known as:  PLAVIX Take 1 tablet (75 mg total) by mouth daily with breakfast.   CREON 36000 UNITS Cpep capsule Generic drug:  lipase/protease/amylase Take 36,000-72,000 Units by mouth as needed. Take one capsule (36,000 units) by mouth every morning, may increase to two capsules (72,000 units) as needed for diarrhea   DULERA IN Inhale 1 puff into the lungs daily as needed (shortness of breath/wheezing).   feeding supplement Liqd Take 1 Container by mouth 2 (two) times daily between meals.   fenofibrate 160 MG tablet Take 1 tablet (160 mg total) by mouth daily.   levothyroxine 88 MCG tablet Commonly known as:  SYNTHROID, LEVOTHROID Take 88 mcg by mouth daily before breakfast. Must be NAME BRAND "Synthroid"   losartan 50 MG tablet Commonly known as:   COZAAR Take 1 tablet (50 mg total) by mouth daily.   methocarbamol 500 MG tablet Commonly known as:  ROBAXIN Take 1 tablet (500 mg total) by mouth every 6 (six) hours as needed for muscle spasms.   nitroGLYCERIN 0.4 MG SL tablet Commonly known as:  NITROSTAT Place 0.4 mg  under the tongue every 5 (five) minutes as needed for chest pain.   rosuvastatin 20 MG tablet Commonly known as:  CRESTOR Take 1 tablet (20 mg total) by mouth daily at 6 PM. What changed:    how much to take  when to take this   venlafaxine XR 150 MG 24 hr capsule Commonly known as:  EFFEXOR-XR Take 150-300 mg by mouth daily as needed.   VITAMIN D PO Take 1 tablet by mouth daily.          Outstanding Labs/Studies   Outpatient NST scheduled for 12/22/17 to further evaluate intermediate OM lesion.   Duration of Discharge Encounter   Greater than 30 minutes including physician time.  Signed, Abigail Butts PA-C 11/30/2017, 12:26 PM

## 2017-12-01 NOTE — Telephone Encounter (Signed)
TOC attempt # 1- Left message for pt to call back.

## 2017-12-02 NOTE — Telephone Encounter (Signed)
Patient contacted regarding discharge from [facility] on 11/30/17.  Patient understands to follow up with Kerin Ransom on 12/09/17 at 8:30 am at [facility]. Patient understands her discharge instructions. Patient understands her medications and regimen. Patient understands to bring all of her medications to this visit.  No c/o chest pain, dizziness or sob.

## 2017-12-06 ENCOUNTER — Ambulatory Visit (HOSPITAL_COMMUNITY): Payer: PPO

## 2017-12-08 ENCOUNTER — Ambulatory Visit (HOSPITAL_COMMUNITY): Payer: PPO

## 2017-12-09 ENCOUNTER — Encounter: Payer: Self-pay | Admitting: Cardiology

## 2017-12-09 ENCOUNTER — Ambulatory Visit (INDEPENDENT_AMBULATORY_CARE_PROVIDER_SITE_OTHER): Payer: PPO | Admitting: Cardiology

## 2017-12-09 VITALS — BP 102/58 | HR 106 | Ht 65.0 in | Wt 150.2 lb

## 2017-12-09 DIAGNOSIS — E785 Hyperlipidemia, unspecified: Secondary | ICD-10-CM | POA: Diagnosis not present

## 2017-12-09 DIAGNOSIS — I10 Essential (primary) hypertension: Secondary | ICD-10-CM | POA: Diagnosis not present

## 2017-12-09 DIAGNOSIS — R0789 Other chest pain: Secondary | ICD-10-CM | POA: Diagnosis not present

## 2017-12-09 DIAGNOSIS — I2510 Atherosclerotic heart disease of native coronary artery without angina pectoris: Secondary | ICD-10-CM

## 2017-12-09 DIAGNOSIS — Z9861 Coronary angioplasty status: Secondary | ICD-10-CM | POA: Diagnosis not present

## 2017-12-09 MED ORDER — ISOSORBIDE MONONITRATE ER 30 MG PO TB24: 15.0000 mg | ORAL_TABLET | Freq: Every day | ORAL | 3 refills | Status: DC

## 2017-12-09 NOTE — Assessment & Plan Note (Signed)
Unclear etiology-cath 11/29/17 showed patent LAD stents, 70% Dx1, 90% Dx2 Myoview pending

## 2017-12-09 NOTE — Progress Notes (Signed)
12/09/2017 [name]   Aug 19, 1945  [id number]  Primary Physician Josetta Huddle, MD Primary Cardiologist: Dr [name]  HPI:  72 y/o female, lives alone, followed by Dr [name]. She had a NSTEMI in Jan 2018 when she was admitted with a broken hip. She had a mLAD stent placed then. She was readmitted in April 2019 with chest pain and had a pLAD stent placed. She was admitted once more with chest pain 11/28/17. Troponin were negative. Cath done 7/22 showed patent LAD stents with a 70% Dx1 narrowing and a 90% Dx2 narrowing. LVF was normal.   She is seen in the office today for follow up. She had multiple questions. She is still having chest pain. She feels she was discharged from the hospital too soon and wanted a "patient advocacy form" which I was unable to locate. She tells me she was not given one in the hospital. She asked for a Home Health Aid and I told her I would contact Social Services. She was concerned because she can feel a lump in her Rt nares, she though it might be MRSA. She asked about carotid dopplers because she had some neck pain. She continues to c/o of intermittent Lt chest pain. She does not take NTG.   I reviewed her cath diagrams with her and explained Dr [name] wanted to get a Myoview to assess the significance of the Dx lesions. She has no history of stroke or TIA and no bruit on exam and I explained she did not need dopplers.    Current Outpatient Medications  Medication Sig Dispense Refill  . ALBUTEROL SULFATE HFA IN Inhale 2 puffs into the lungs every 6 (six) hours as needed (shortness of breath/wheezing).    Marland Kitchen aspirin 81 MG EC tablet Take 1 tablet (81 mg total) by mouth daily. 30 tablet 11  . Calcium Carb-Cholecalciferol (CALCIUM-VITAMIN D) 600-400 MG-UNIT TABS Take by mouth.    . carvedilol (COREG) 25 MG tablet Take 1 tablet (25 mg total) by mouth 2 (two) times daily with a meal. (Patient taking differently: Take 25 mg by mouth daily. 50mg  qd) 60 tablet 6  . clopidogrel  (PLAVIX) 75 MG tablet Take 1 tablet (75 mg total) by mouth daily with breakfast. 30 tablet 2  . feeding supplement (BOOST / RESOURCE BREEZE) LIQD Take 1 Container by mouth 2 (two) times daily between meals.  0  . fenofibrate 160 MG tablet Take 1 tablet (160 mg total) by mouth daily. 30 tablet 6  . levothyroxine (SYNTHROID, LEVOTHROID) 88 MCG tablet Take 88 mcg by mouth daily before breakfast. Must be NAME BRAND "Synthroid"    . lipase/protease/amylase (CREON) 36000 UNITS CPEP capsule Take 36,000-72,000 Units by mouth as needed. Take one capsule (36,000 units) by mouth every morning, may increase to two capsules (72,000 units) as needed for diarrhea    . losartan (COZAAR) 50 MG tablet Take 1 tablet (50 mg total) by mouth daily. 90 tablet 1  . methocarbamol (ROBAXIN) 500 MG tablet Take 1 tablet (500 mg total) by mouth every 6 (six) hours as needed for muscle spasms. 30 tablet 3  . Mometasone Furo-Formoterol Fum (DULERA IN) Inhale 1 puff into the lungs daily as needed (shortness of breath/wheezing).    . nitroGLYCERIN (NITROSTAT) 0.4 MG SL tablet Place 0.4 mg under the tongue every 5 (five) minutes as needed for chest pain.    . rosuvastatin (CRESTOR) 20 MG tablet Take 1 tablet (20 mg total) by mouth daily at 6 PM. 90  tablet 3  . venlafaxine XR (EFFEXOR-XR) 150 MG 24 hr capsule Take 150-300 mg by mouth daily as needed.     . Cholecalciferol (VITAMIN D PO) Take 1 tablet by mouth daily.    . isosorbide mononitrate (IMDUR) 30 MG 24 hr tablet Take 0.5 tablets (15 mg total) by mouth daily. 45 tablet 3   No current facility-administered medications for this visit.    Facility-Administered Medications Ordered in Other Visits  Medication Dose Route Frequency Provider Last Rate Last Dose  . fentaNYL (SUBLIMAZE) injection    Anesthesia Intra-op Marinda Elk A, CRNA   50 mcg at 05/31/16 0721  . lactated ringers infusion    Continuous PRN Marinda Elk A, CRNA 50 mL/hr at 06/03/16 2300      Allergies    Allergen Reactions  . Cortisone Anaphylaxis    Some forms of cortisone  . Demerol [Meperidine] Anaphylaxis  . Monosodium Glutamate Anaphylaxis  . Morphine And Related Anaphylaxis, Nausea And Vomiting and Other (See Comments)  . Prednisone Anaphylaxis    confusion  . Shellfish Allergy Anaphylaxis  . Sulfa Antibiotics Anaphylaxis  . Buprenorphine Hcl Nausea And Vomiting and Hypertension    SHAKING  . Readi-Cat [Barium Sulfate] Diarrhea    Patient does not want to drink Readi-Cat because it gives her diarrhea  . Xanax [Alprazolam] Other (See Comments)    "makes me crazy"  . Benzoic Acid Nausea Only    Past Medical History:  Diagnosis Date  . Anemia   . Arthritis   . Bleeding diathesis ([place]) 02/22/2013  . Closed right hip fracture ([place]) 05/28/2016  . Coronary artery disease   . E coli enteritis   . GERD (gastroesophageal reflux disease)   . H/O hiatal hernia   . Head injury   . Hypertension   . Iron deficiency anemia, unspecified 02/22/2013  . Mass of right kidney 08/22/2013   2.5 cm solid lesion seen on 5/14 CT  . Radicular pain of thoracic region 08/22/2013   Left T-8 started after pushing heavy chest 1/15; intermittent; no focal neuro deficit    Social History   Socioeconomic History  . Marital status: Widowed    Spouse name: Not on file  . Number of children: 1  . Years of education: Not on file  . Highest education level: Not on file  Occupational History  . Occupation: retired  Scientific laboratory technician  . Financial resource strain: Not on file  . Food insecurity:    Worry: Not on file    Inability: Not on file  . Transportation needs:    Medical: Not on file    Non-medical: Not on file  Tobacco Use  . Smoking status: Never Smoker  . Smokeless tobacco: Never Used  Substance and Sexual Activity  . Alcohol use: No    Alcohol/week: 0.0 oz  . Drug use: No  . Sexual activity: Not on file  Lifestyle  . Physical activity:    Days per week: Not on file    Minutes per  session: Not on file  . Stress: Not on file  Relationships  . Social connections:    Talks on phone: Not on file    Gets together: Not on file    Attends religious service: Not on file    Active member of club or organization: Not on file    Attends meetings of clubs or organizations: Not on file    Relationship status: Not on file  . Intimate partner violence:    Fear  of current or ex partner: Not on file    Emotionally abused: Not on file    Physically abused: Not on file    Forced sexual activity: Not on file  Other Topics Concern  . Not on file  Social History Narrative  . Not on file     Family History  Problem Relation Age of Onset  . Heart attack Father   . Heart disease Mother   . Alzheimer's disease Mother   . Heart disease Brother   . Colon cancer Maternal Grandfather   . Stomach cancer Neg Hx   . Rectal cancer Neg Hx   . Esophageal cancer Neg Hx   . Liver cancer Neg Hx      Review of Systems: General: negative for chills, fever, night sweats or weight changes.  Cardiovascular: negative for chest pain, dyspnea on exertion, edema, orthopnea, palpitations, paroxysmal nocturnal dyspnea or shortness of breath Dermatological: negative for rash Respiratory: negative for cough or wheezing Urologic: negative for hematuria Abdominal: negative for nausea, vomiting, diarrhea, bright red blood per rectum, melena, or hematemesis Neurologic: negative for visual changes, syncope, or dizziness All other systems reviewed and are otherwise negative except as noted above.    Blood pressure (!) 102/58, pulse (!) 106, height 5\' 5"  (1.651 m), weight 150 lb 3.2 oz (68.1 kg), SpO2 98 %.  General appearance: alert, cooperative and no distress Neck: no carotid bruit, no JVD and Rt nares- no obvious obsrtuction or mass Lungs: clear to auscultation bilaterally Heart: regular rate and rhythm Extremities: ecchymosis Rt forearm Skin: Skin color, texture, turgor normal. No rashes or  lesions Neurologic: Grossly normal, slight resting tremor   ASSESSMENT AND PLAN:   Chest pain Unclear etiology-cath 11/29/17 showed patent LAD stents, 70% Dx1, 90% Dx2 Myoview pending  CAD S/P percutaneous coronary angioplasty Non-STEMI January 2018 in the setting of a hip fracture Catheterization 06/01/16 showing 90% mid LAD stenosis-DES NSTEMI 08/13/17- cath mLAD and pLAD PCI with DES Both sites patent 11/29/17- residual Dx1 and Dx2 disease (smal vessels)  Essential hypertension Controlled  Dyslipidemia, goal LDL below 70 LDL 69 on Crestor   PLAN  I added Imdur 15 mg. She'll f/u with Dr [name] after her Myoview. I will arrange for Social Services consult to see if she qualifies for any home aid. I suggested she contact her PCP about her Rt nares.   Kerin Ransom PA-C 12/09/2017 9:17 AM

## 2017-12-09 NOTE — Assessment & Plan Note (Signed)
LDL 69 on Crestor

## 2017-12-09 NOTE — Assessment & Plan Note (Signed)
Non-STEMI January 2018 in the setting of a hip fracture Catheterization 06/01/16 showing 90% mid LAD stenosis-DES NSTEMI 08/13/17- cath mLAD and pLAD PCI with DES Both sites patent 11/29/17- residual Dx1 and Dx2 disease (smal vessels)

## 2017-12-09 NOTE — Progress Notes (Signed)
AMB REF

## 2017-12-09 NOTE — Assessment & Plan Note (Signed)
Controlled.  

## 2017-12-09 NOTE — Patient Instructions (Addendum)
Medication Instructions:  1. START IMDUR 30 MG TABLET WITH DIRECTIONS TO TAKE 1/2 TABLET DAILY = 15 MG DAILY; RX HAS BEEN SENT IN   Labwork: NONE ORDERED TODAY  Testing/Procedures: YOU ARE SCHEDULED FOR A STRESS TEST TO BE DONE ON 12/22/17 @ 9:15 AM   Follow-Up: DR. [name] ON 01/04/18 @ 3 PM   A REFERRAL FOR A SOCIAL WORKER HAS BEEN PLACED   Any Other Special Instructions Will Be Listed Below (If Applicable).     If you need a refill on your cardiac medications before your next appointment, please call your pharmacy.

## 2017-12-10 ENCOUNTER — Ambulatory Visit (HOSPITAL_COMMUNITY): Payer: PPO

## 2017-12-13 ENCOUNTER — Ambulatory Visit (HOSPITAL_COMMUNITY): Payer: PPO

## 2017-12-13 DIAGNOSIS — I10 Essential (primary) hypertension: Secondary | ICD-10-CM | POA: Diagnosis not present

## 2017-12-13 DIAGNOSIS — E559 Vitamin D deficiency, unspecified: Secondary | ICD-10-CM | POA: Diagnosis not present

## 2017-12-13 DIAGNOSIS — R55 Syncope and collapse: Secondary | ICD-10-CM | POA: Diagnosis not present

## 2017-12-13 DIAGNOSIS — K8689 Other specified diseases of pancreas: Secondary | ICD-10-CM | POA: Diagnosis not present

## 2017-12-13 DIAGNOSIS — F322 Major depressive disorder, single episode, severe without psychotic features: Secondary | ICD-10-CM | POA: Diagnosis not present

## 2017-12-13 DIAGNOSIS — E785 Hyperlipidemia, unspecified: Secondary | ICD-10-CM | POA: Diagnosis not present

## 2017-12-13 DIAGNOSIS — I200 Unstable angina: Secondary | ICD-10-CM | POA: Diagnosis not present

## 2017-12-13 DIAGNOSIS — J3489 Other specified disorders of nose and nasal sinuses: Secondary | ICD-10-CM | POA: Diagnosis not present

## 2017-12-13 DIAGNOSIS — I214 Non-ST elevation (NSTEMI) myocardial infarction: Secondary | ICD-10-CM | POA: Diagnosis not present

## 2017-12-15 ENCOUNTER — Ambulatory Visit (HOSPITAL_COMMUNITY): Payer: PPO

## 2017-12-17 ENCOUNTER — Ambulatory Visit (HOSPITAL_COMMUNITY): Payer: PPO

## 2017-12-17 ENCOUNTER — Telehealth (HOSPITAL_COMMUNITY): Payer: Self-pay

## 2017-12-17 NOTE — Telephone Encounter (Signed)
Encounter complete. 

## 2017-12-20 ENCOUNTER — Ambulatory Visit (HOSPITAL_COMMUNITY): Payer: PPO

## 2017-12-21 ENCOUNTER — Ambulatory Visit: Payer: PPO | Admitting: Cardiovascular Disease

## 2017-12-21 ENCOUNTER — Telehealth (HOSPITAL_COMMUNITY): Payer: Self-pay | Admitting: *Deleted

## 2017-12-21 ENCOUNTER — Telehealth (HOSPITAL_COMMUNITY): Payer: Self-pay

## 2017-12-21 NOTE — Telephone Encounter (Signed)
Awaiting return call at this time. 

## 2017-12-21 NOTE — Telephone Encounter (Signed)
Close encounter 

## 2017-12-22 ENCOUNTER — Ambulatory Visit (HOSPITAL_COMMUNITY)
Admission: RE | Admit: 2017-12-22 | Discharge: 2017-12-22 | Disposition: A | Discharge status: Home / Self Care | Payer: PPO | Source: Ambulatory Visit | Attending: Cardiology | Admitting: Cardiology

## 2017-12-22 ENCOUNTER — Encounter: Payer: Self-pay | Admitting: Cardiovascular Disease

## 2017-12-22 ENCOUNTER — Ambulatory Visit (HOSPITAL_COMMUNITY): Payer: PPO

## 2017-12-22 ENCOUNTER — Encounter: Payer: Self-pay | Admitting: Cardiology

## 2017-12-22 DIAGNOSIS — I200 Unstable angina: Secondary | ICD-10-CM

## 2017-12-24 ENCOUNTER — Ambulatory Visit (HOSPITAL_COMMUNITY): Payer: PPO

## 2017-12-24 ENCOUNTER — Ambulatory Visit (HOSPITAL_COMMUNITY)
Admission: RE | Admit: 2017-12-24 | Discharge: 2017-12-24 | Disposition: A | Discharge status: Home / Self Care | Payer: PPO | Source: Ambulatory Visit | Attending: Cardiology | Admitting: Cardiology

## 2017-12-24 DIAGNOSIS — I200 Unstable angina: Secondary | ICD-10-CM | POA: Diagnosis not present

## 2017-12-24 LAB — MYOCARDIAL PERFUSION IMAGING
CHL CUP NUCLEAR SRS: 0
CSEPPHR: 102 {beats}/min
LV dias vol: 50 mL (ref 46–106)
LV sys vol: 17 mL
Rest HR: 78 {beats}/min
SSS: 0
TID: 1.17

## 2017-12-24 MED ORDER — REGADENOSON 0.4 MG/5ML IV SOLN
0.4000 mg | Freq: Once | INTRAVENOUS | Status: AC
  Administered 2017-12-24: 0.4 mg via INTRAVENOUS

## 2017-12-24 MED ORDER — TECHNETIUM TC 99M TETROFOSMIN IV KIT
30.8000 | PACK | Freq: Once | INTRAVENOUS | Status: AC | PRN
  Administered 2017-12-24: 30.8 via INTRAVENOUS
  Filled 2017-12-24: qty 31

## 2017-12-24 MED ORDER — TECHNETIUM TC 99M TETROFOSMIN IV KIT
10.5000 | PACK | Freq: Once | INTRAVENOUS | Status: AC | PRN
  Administered 2017-12-24: 10.5 via INTRAVENOUS
  Filled 2017-12-24: qty 11

## 2017-12-24 MED ORDER — AMINOPHYLLINE 25 MG/ML IV SOLN
75.0000 mg | Freq: Once | INTRAVENOUS | Status: AC
  Administered 2017-12-24: 75 mg via INTRAVENOUS

## 2017-12-27 ENCOUNTER — Ambulatory Visit (HOSPITAL_COMMUNITY): Payer: PPO

## 2017-12-29 ENCOUNTER — Ambulatory Visit (HOSPITAL_COMMUNITY): Payer: PPO

## 2017-12-31 ENCOUNTER — Ambulatory Visit (HOSPITAL_COMMUNITY): Payer: PPO

## 2018-01-03 ENCOUNTER — Ambulatory Visit (HOSPITAL_COMMUNITY): Payer: PPO

## 2018-01-04 ENCOUNTER — Ambulatory Visit: Payer: PPO | Admitting: Cardiovascular Disease

## 2018-01-05 ENCOUNTER — Ambulatory Visit (HOSPITAL_COMMUNITY): Payer: PPO

## 2018-01-07 ENCOUNTER — Ambulatory Visit (HOSPITAL_COMMUNITY): Payer: PPO

## 2018-01-07 ENCOUNTER — Ambulatory Visit (INDEPENDENT_AMBULATORY_CARE_PROVIDER_SITE_OTHER): Payer: PPO | Admitting: Cardiovascular Disease

## 2018-01-07 ENCOUNTER — Ambulatory Visit: Payer: PPO | Admitting: Cardiovascular Disease

## 2018-01-07 ENCOUNTER — Encounter: Payer: Self-pay | Admitting: Cardiovascular Disease

## 2018-01-07 DIAGNOSIS — I10 Essential (primary) hypertension: Secondary | ICD-10-CM

## 2018-01-07 DIAGNOSIS — E785 Hyperlipidemia, unspecified: Secondary | ICD-10-CM | POA: Diagnosis not present

## 2018-01-07 DIAGNOSIS — Z955 Presence of coronary angioplasty implant and graft: Secondary | ICD-10-CM | POA: Diagnosis not present

## 2018-01-07 NOTE — Patient Instructions (Signed)
Your physician recommends that you schedule a follow-up appointment in: [place]  Your physician wants you to follow-up in: [place] will receive a reminder letter in the mail two months in advance. If you don't receive a letter, please call our office to schedule the follow-up appointment.   If you need a refill on your cardiac medications before your next appointment, please call your pharmacy.

## 2018-01-07 NOTE — Assessment & Plan Note (Signed)
History of essential hypertension blood pressure measured today at 172/102.  She is on losartan 50 mg.  She was supposed to be taking carvedilol 25 mg p.o. twice daily which she had not been taking regularly and has just refilled her prescription and took 1 dose this morning.

## 2018-01-07 NOTE — Assessment & Plan Note (Signed)
History of hyperlipidemia on statin therapy as well as fenofibrate with lipid profile performed 11/29/2017 revealing total cholesterol 149, LDL 69 and HDL 49.

## 2018-01-07 NOTE — Assessment & Plan Note (Signed)
History of CAD status post LAD stenting by myself 06/01/2016 with repeat stenting of the proximal LAD by Dr. Angelena Form 08/14/2017.  She underwent diagnostic coronary angiography by Dr. Tamala Julian in the setting of admission for unstable angina 11/29/2017 revealing patent LAD stents and a jailed small first diagonal branch which was diffusely diseased.  Subsequent Myoview stress test performed 12/24/2017 was nonischemic.  She continues to get occasional atypical chest pain.  She saw Kerin Ransom back a month ago who prescribed low-dose indoor which she never filled because of fear of headache.

## 2018-01-07 NOTE — Progress Notes (Signed)
01/07/2018 Caitlyn [name]   1945-12-06  [id number]  Primary Physician Josetta Huddle, MD Primary Cardiologist: Lorretta Harp MD [name], [place]  HPI:  Caitlyn [name] is a 71 y.o.  mildly overweight Caucasian female who I been taking care of for many years. I last saw her in the office  09/17/2017.I did perform coronary angiography on her in 2002 demonstrating normal coronary arteries. She does have a strong family history of heart disease. She apparently fell and broke her hip on the ice several days ago and was admitted to the hospital with an intention to perform ORIF of her right hip by Dr. Lyla Glassing . She has had several episodes of chest pain with mildly elevated troponin. I performed cardiac catheterization on her 06/01/16 revealing a 90% mid LAD lesion which I stented with a Synergy drug-eluting stent with an excellent result. She underwent right ORIF for fractured hip one week later by Dr. Lyla Glassing on dualantiplatelettherapy.She underwent physical therapy and rehabilitation.  She was doing well until 08/13/2017 when she developed chest pain was brought to [facility].  She had a "non-STEMI with a very low troponin rise and minimal ST segment changes.  The following day, 08/14/2017, she underwent a radial cath by Dr. Angelena Form revealing a 90% proximal LAD lesion, and a 70% lesion just proximal to the previous he placed mid LAD stent both of which were stented with synergy drug-eluting stents.  Her EF was in the 35 to 40% range, significantly lower than it had been previously which I 6 suspect was related to "stunned myocardium".  She does complain of some mild shortness of breath.  She was admitted to the hospital 11/28/2017 with unstable angina and underwent cardiac catheterization radially by Dr. Tamala Julian the following day revealing patent LAD stents, a jailed small diffusely diseased first diagonal branch with otherwise noncritical CAD and normal LV function.  A subsequent Myoview  stress test performed 12/24/2017 was low risk and nonischemic ischemic.  She saw Kerin Ransom in the office 12/09/2017 who prescribed low-dose Imdur which she failed to take because of fear of headaches.  She also has not been taking her carvedilol as prescribed.  Current Meds  Medication Sig  . ALBUTEROL SULFATE HFA IN Inhale 2 puffs into the lungs every 6 (six) hours as needed (shortness of breath/wheezing).  Marland Kitchen aspirin 81 MG EC tablet Take 1 tablet (81 mg total) by mouth daily.  . Calcium Carb-Cholecalciferol (CALCIUM-VITAMIN D) 600-400 MG-UNIT TABS Take by mouth.  . carvedilol (COREG) 25 MG tablet Take 1 tablet (25 mg total) by mouth 2 (two) times daily with a meal. (Patient taking differently: Take 25 mg by mouth daily. 50mg  qd)  . Cholecalciferol (VITAMIN D PO) Take 1 tablet by mouth daily.  . clopidogrel (PLAVIX) 75 MG tablet Take 1 tablet (75 mg total) by mouth daily with breakfast.  . fenofibrate 160 MG tablet Take 1 tablet (160 mg total) by mouth daily.  Marland Kitchen levothyroxine (SYNTHROID, LEVOTHROID) 88 MCG tablet Take 88 mcg by mouth daily before breakfast. Must be NAME BRAND "Synthroid"  . lipase/protease/amylase (CREON) 36000 UNITS CPEP capsule Take 36,000-72,000 Units by mouth as needed. Take one capsule (36,000 units) by mouth every morning, may increase to two capsules (72,000 units) as needed for diarrhea  . losartan (COZAAR) 50 MG tablet Take 1 tablet (50 mg total) by mouth daily.  . methocarbamol (ROBAXIN) 500 MG tablet Take 1 tablet (500 mg total) by mouth every 6 (six) hours as needed  for muscle spasms.  . Mometasone Furo-Formoterol Fum (DULERA IN) Inhale 1 puff into the lungs daily as needed (shortness of breath/wheezing).  . nitroGLYCERIN (NITROSTAT) 0.4 MG SL tablet Place 0.4 mg under the tongue every 5 (five) minutes as needed for chest pain.  . rosuvastatin (CRESTOR) 20 MG tablet Take 1 tablet (20 mg total) by mouth daily at 6 PM.  . venlafaxine XR (EFFEXOR-XR) 150 MG 24 hr capsule  Take 150-300 mg by mouth daily as needed.      Allergies  Allergen Reactions  . Cortisone Anaphylaxis    Some forms of cortisone  . Demerol [Meperidine] Anaphylaxis  . Monosodium Glutamate Anaphylaxis  . Morphine And Related Anaphylaxis, Nausea And Vomiting and Other (See Comments)  . Prednisone Anaphylaxis    confusion  . Shellfish Allergy Anaphylaxis  . Sulfa Antibiotics Anaphylaxis  . Buprenorphine Hcl Nausea And Vomiting and Hypertension    SHAKING  . Readi-Cat [Barium Sulfate] Diarrhea    Patient does not want to drink Readi-Cat because it gives her diarrhea  . Xanax [Alprazolam] Other (See Comments)    "makes me crazy"  . Benzoic Acid Nausea Only    Social History   Socioeconomic History  . Marital status: Widowed    Spouse name: Not on file  . Number of children: 1  . Years of education: Not on file  . Highest education level: Not on file  Occupational History  . Occupation: retired  Scientific laboratory technician  . Financial resource strain: Not on file  . Food insecurity:    Worry: Not on file    Inability: Not on file  . Transportation needs:    Medical: Not on file    Non-medical: Not on file  Tobacco Use  . Smoking status: Never Smoker  . Smokeless tobacco: Never Used  Substance and Sexual Activity  . Alcohol use: No    Alcohol/week: 0.0 standard drinks  . Drug use: No  . Sexual activity: Not on file  Lifestyle  . Physical activity:    Days per week: Not on file    Minutes per session: Not on file  . Stress: Not on file  Relationships  . Social connections:    Talks on phone: Not on file    Gets together: Not on file    Attends religious service: Not on file    Active member of club or organization: Not on file    Attends meetings of clubs or organizations: Not on file    Relationship status: Not on file  . Intimate partner violence:    Fear of current or ex partner: Not on file    Emotionally abused: Not on file    Physically abused: Not on file    Forced  sexual activity: Not on file  Other Topics Concern  . Not on file  Social History Narrative  . Not on file     Review of Systems: General: negative for chills, fever, night sweats or weight changes.  Cardiovascular: negative for chest pain, dyspnea on exertion, edema, orthopnea, palpitations, paroxysmal nocturnal dyspnea or shortness of breath Dermatological: negative for rash Respiratory: negative for cough or wheezing Urologic: negative for hematuria Abdominal: negative for nausea, vomiting, diarrhea, bright red blood per rectum, melena, or hematemesis Neurologic: negative for visual changes, syncope, or dizziness All other systems reviewed and are otherwise negative except as noted above.    Blood pressure (!) 172/102, pulse 86, height 5\' 5"  (1.651 m), weight 151 lb (68.5 kg).  General appearance:  alert and no distress Neck: no adenopathy, no carotid bruit, no JVD, supple, symmetrical, trachea midline and thyroid not enlarged, symmetric, no tenderness/mass/nodules Lungs: clear to auscultation bilaterally Heart: regular rate and rhythm, S1, S2 normal, no murmur, click, rub or gallop Extremities: extremities normal, atraumatic, no cyanosis or edema Pulses: 2+ and symmetric Skin: Skin color, texture, turgor normal. No rashes or lesions  EKG not performed today  ASSESSMENT AND PLAN:   Essential hypertension History of essential hypertension blood pressure measured today at 172/102.  She is on losartan 50 mg.  She was supposed to be taking carvedilol 25 mg p.o. twice daily which she had not been taking regularly and has just refilled her prescription and took 1 dose this morning.  Dyslipidemia, goal LDL below 70 History of hyperlipidemia on statin therapy as well as fenofibrate with lipid profile performed 11/29/2017 revealing total cholesterol 149, LDL 69 and HDL 49.  Status post insertion of drug-eluting stent into left anterior descending (LAD) artery History of CAD status post  LAD stenting by myself 06/01/2016 with repeat stenting of the proximal LAD by Dr. Angelena Form 08/14/2017.  She underwent diagnostic coronary angiography by Dr. Tamala Julian in the setting of admission for unstable angina 11/29/2017 revealing patent LAD stents and a jailed small first diagonal branch which was diffusely diseased.  Subsequent Myoview stress test performed 12/24/2017 was nonischemic.  She continues to get occasional atypical chest pain.  She saw Kerin Ransom back a month ago who prescribed low-dose indoor which she never filled because of fear of headache.      Lorretta Harp MD FACP,FACC,FAHA, [facility] 01/07/2018 4:35 PM

## 2018-01-12 ENCOUNTER — Ambulatory Visit (HOSPITAL_COMMUNITY): Payer: PPO

## 2018-01-12 ENCOUNTER — Encounter

## 2018-01-12 ENCOUNTER — Ambulatory Visit: Payer: PPO

## 2018-01-14 ENCOUNTER — Ambulatory Visit (HOSPITAL_COMMUNITY): Payer: PPO

## 2018-01-17 ENCOUNTER — Ambulatory Visit (HOSPITAL_COMMUNITY): Payer: PPO

## 2018-01-19 ENCOUNTER — Ambulatory Visit (HOSPITAL_COMMUNITY): Payer: PPO

## 2018-01-21 ENCOUNTER — Ambulatory Visit (HOSPITAL_COMMUNITY): Payer: PPO

## 2018-01-24 ENCOUNTER — Ambulatory Visit (HOSPITAL_COMMUNITY): Payer: PPO

## 2018-01-26 ENCOUNTER — Ambulatory Visit (HOSPITAL_COMMUNITY): Payer: PPO

## 2018-01-28 ENCOUNTER — Ambulatory Visit (HOSPITAL_COMMUNITY): Payer: PPO

## 2018-01-31 ENCOUNTER — Ambulatory Visit (HOSPITAL_COMMUNITY): Payer: PPO

## 2018-02-01 DIAGNOSIS — H6122 Impacted cerumen, left ear: Secondary | ICD-10-CM | POA: Diagnosis not present

## 2018-02-01 DIAGNOSIS — Z23 Encounter for immunization: Secondary | ICD-10-CM | POA: Diagnosis not present

## 2018-02-01 DIAGNOSIS — H60312 Diffuse otitis externa, left ear: Secondary | ICD-10-CM | POA: Diagnosis not present

## 2018-02-02 ENCOUNTER — Ambulatory Visit (HOSPITAL_COMMUNITY): Payer: PPO

## 2018-02-04 ENCOUNTER — Ambulatory Visit (HOSPITAL_COMMUNITY): Payer: PPO

## 2018-02-07 ENCOUNTER — Ambulatory Visit (HOSPITAL_COMMUNITY): Payer: PPO

## 2018-02-09 ENCOUNTER — Ambulatory Visit (HOSPITAL_COMMUNITY): Payer: PPO

## 2018-02-09 ENCOUNTER — Ambulatory Visit: Payer: PPO | Admitting: Cardiovascular Disease

## 2018-02-11 ENCOUNTER — Ambulatory Visit (HOSPITAL_COMMUNITY): Payer: PPO

## 2018-02-14 ENCOUNTER — Ambulatory Visit (HOSPITAL_COMMUNITY): Payer: PPO

## 2018-02-15 ENCOUNTER — Other Ambulatory Visit: Payer: Self-pay | Admitting: Oncology

## 2018-02-15 DIAGNOSIS — D508 Other iron deficiency anemias: Secondary | ICD-10-CM

## 2018-02-15 DIAGNOSIS — K909 Intestinal malabsorption, unspecified: Secondary | ICD-10-CM

## 2018-02-16 ENCOUNTER — Ambulatory Visit (HOSPITAL_COMMUNITY): Payer: PPO

## 2018-02-16 DIAGNOSIS — F322 Major depressive disorder, single episode, severe without psychotic features: Secondary | ICD-10-CM | POA: Diagnosis not present

## 2018-02-16 DIAGNOSIS — E559 Vitamin D deficiency, unspecified: Secondary | ICD-10-CM | POA: Diagnosis not present

## 2018-02-16 DIAGNOSIS — K8689 Other specified diseases of pancreas: Secondary | ICD-10-CM | POA: Diagnosis not present

## 2018-02-16 DIAGNOSIS — E785 Hyperlipidemia, unspecified: Secondary | ICD-10-CM | POA: Diagnosis not present

## 2018-02-16 DIAGNOSIS — E611 Iron deficiency: Secondary | ICD-10-CM | POA: Diagnosis not present

## 2018-02-16 DIAGNOSIS — Z0001 Encounter for general adult medical examination with abnormal findings: Secondary | ICD-10-CM | POA: Diagnosis not present

## 2018-02-16 DIAGNOSIS — I10 Essential (primary) hypertension: Secondary | ICD-10-CM | POA: Diagnosis not present

## 2018-02-16 DIAGNOSIS — J3489 Other specified disorders of nose and nasal sinuses: Secondary | ICD-10-CM | POA: Diagnosis not present

## 2018-02-16 DIAGNOSIS — R61 Generalized hyperhidrosis: Secondary | ICD-10-CM | POA: Diagnosis not present

## 2018-02-16 DIAGNOSIS — M79651 Pain in right thigh: Secondary | ICD-10-CM | POA: Diagnosis not present

## 2018-02-16 DIAGNOSIS — I214 Non-ST elevation (NSTEMI) myocardial infarction: Secondary | ICD-10-CM | POA: Diagnosis not present

## 2018-02-18 ENCOUNTER — Ambulatory Visit (HOSPITAL_COMMUNITY): Payer: PPO

## 2018-02-18 ENCOUNTER — Telehealth: Payer: Self-pay | Admitting: Oncology

## 2018-02-18 NOTE — Telephone Encounter (Signed)
Pt missed call, pls call [phone]

## 2018-02-21 ENCOUNTER — Ambulatory Visit (HOSPITAL_COMMUNITY): Payer: PPO

## 2018-02-22 ENCOUNTER — Other Ambulatory Visit: Payer: PPO

## 2018-02-23 ENCOUNTER — Ambulatory Visit (HOSPITAL_COMMUNITY): Payer: PPO

## 2018-02-25 ENCOUNTER — Ambulatory Visit (HOSPITAL_COMMUNITY): Payer: PPO

## 2018-02-28 ENCOUNTER — Ambulatory Visit (HOSPITAL_COMMUNITY): Payer: PPO

## 2018-03-01 ENCOUNTER — Ambulatory Visit (INDEPENDENT_AMBULATORY_CARE_PROVIDER_SITE_OTHER): Payer: PPO | Admitting: Cardiology

## 2018-03-01 ENCOUNTER — Encounter: Payer: Self-pay | Admitting: Cardiology

## 2018-03-01 VITALS — BP 160/95 | HR 79 | Ht 65.0 in | Wt 150.0 lb

## 2018-03-01 DIAGNOSIS — I2510 Atherosclerotic heart disease of native coronary artery without angina pectoris: Secondary | ICD-10-CM | POA: Diagnosis not present

## 2018-03-01 DIAGNOSIS — E785 Hyperlipidemia, unspecified: Secondary | ICD-10-CM

## 2018-03-01 DIAGNOSIS — Z9861 Coronary angioplasty status: Secondary | ICD-10-CM | POA: Diagnosis not present

## 2018-03-01 DIAGNOSIS — I10 Essential (primary) hypertension: Secondary | ICD-10-CM | POA: Diagnosis not present

## 2018-03-01 NOTE — Progress Notes (Signed)
03/01/2018 Caitlyn [name]   [birth year]  [id number]  Primary Physician Josetta Huddle, MD Primary Cardiologist: Dr [name]  HPI:  Pleasant 72 y/o female, lives alone, followed by Dr [name]. She had a NSTEMI in Jan 2018 when she was admitted with a broken hip. She had a mLAD stent placed then. She was readmitted in April 2019 with chest pain and had a pLAD stent placed. She was admitted once more with chest pain 11/28/17. Troponin were negative. Cath done 7/22 showed patent LAD stents with a 70% Dx1 narrowing and a 90% Dx2 narrowing. LVF was normal. Myoview Aug 2019 was low risk. She was treated medically. In retrospect the pt admits she had run out of her Coreg and felt like that had something to do with her symptoms. She saw Dr [name] in August 2019 and was doing well. She is in the office today for follow up. Overall she feels well, she denies chest pain. She admits to fatigue, "I'm not what I used to be". She has a slight resting tremor-head shake- which she was surprised to hear about. There is no history of prior neurologic evaluation. She did tell me her mother had a essential tremor.    Current Outpatient Medications  Medication Sig Dispense Refill  . ALBUTEROL SULFATE HFA IN Inhale 2 puffs into the lungs every 6 (six) hours as needed (shortness of breath/wheezing).    Marland Kitchen ampicillin (PRINCIPEN) 500 MG capsule Take 500 mg by mouth 3 (three) times daily. Only when needed---not currently taking (03/01/18)    . aspirin 81 MG EC tablet Take 1 tablet (81 mg total) by mouth daily. 30 tablet 11  . Calcium Carb-Cholecalciferol (CALCIUM-VITAMIN D) 600-400 MG-UNIT TABS Take by mouth.    . carvedilol (COREG) 25 MG tablet Take 1 tablet (25 mg total) by mouth 2 (two) times daily with a meal. (Patient taking differently: Take 25 mg by mouth daily. 50mg  qd) 60 tablet 6  . Cholecalciferol (VITAMIN D PO) Take 1 tablet by mouth daily.    . clopidogrel (PLAVIX) 75 MG tablet Take 1 tablet (75 mg total) by mouth daily  with breakfast. 30 tablet 2  . fenofibrate 160 MG tablet Take 1 tablet (160 mg total) by mouth daily. 30 tablet 6  . levothyroxine (SYNTHROID, LEVOTHROID) 88 MCG tablet Take 88 mcg by mouth daily before breakfast. Must be NAME BRAND "Synthroid"    . lipase/protease/amylase (CREON) 36000 UNITS CPEP capsule Take 36,000-72,000 Units by mouth as needed. Take one capsule (36,000 units) by mouth every morning, may increase to two capsules (72,000 units) as needed for diarrhea    . losartan (COZAAR) 50 MG tablet Take 1 tablet (50 mg total) by mouth daily. 90 tablet 1  . methocarbamol (ROBAXIN) 500 MG tablet Take 1 tablet (500 mg total) by mouth every 6 (six) hours as needed for muscle spasms. 30 tablet 3  . Mometasone Furo-Formoterol Fum (DULERA IN) Inhale 1 puff into the lungs daily as needed (shortness of breath/wheezing).    . nitroGLYCERIN (NITROSTAT) 0.4 MG SL tablet Place 0.4 mg under the tongue every 5 (five) minutes as needed for chest pain.    . rosuvastatin (CRESTOR) 20 MG tablet Take 1 tablet (20 mg total) by mouth daily at 6 PM. 90 tablet 3  . venlafaxine XR (EFFEXOR-XR) 150 MG 24 hr capsule Take 150-300 mg by mouth daily as needed.      No current facility-administered medications for this visit.    Facility-Administered Medications Ordered in Other Visits  Medication Dose Route Frequency Provider Last Rate Last Dose  . fentaNYL (SUBLIMAZE) injection    Anesthesia Intra-op Marinda Elk A, CRNA   50 mcg at 05/31/16 0721  . lactated ringers infusion    Continuous PRN Marinda Elk A, CRNA 50 mL/hr at 06/03/16 2300      Allergies  Allergen Reactions  . Cortisone Anaphylaxis    Some forms of cortisone  . Demerol [Meperidine] Anaphylaxis  . Monosodium Glutamate Anaphylaxis  . Morphine And Related Anaphylaxis, Nausea And Vomiting and Other (See Comments)  . Prednisone Anaphylaxis    confusion  . Shellfish Allergy Anaphylaxis  . Sulfa Antibiotics Anaphylaxis  . Buprenorphine Hcl  Nausea And Vomiting and Hypertension    SHAKING  . Readi-Cat [Barium Sulfate] Diarrhea    Patient does not want to drink Readi-Cat because it gives her diarrhea  . Xanax [Alprazolam] Other (See Comments)    "makes me crazy"  . Benzoic Acid Nausea Only    Past Medical History:  Diagnosis Date  . Anemia   . Arthritis   . Bleeding diathesis ([place]) 02/22/2013  . Closed right hip fracture ([place]) 05/28/2016  . Coronary artery disease   . E coli enteritis   . GERD (gastroesophageal reflux disease)   . H/O hiatal hernia   . Head injury   . Hypertension   . Iron deficiency anemia, unspecified 02/22/2013  . Mass of right kidney 08/22/2013   2.5 cm solid lesion seen on 5/14 CT  . Radicular pain of thoracic region 08/22/2013   Left T-8 started after pushing heavy chest 1/15; intermittent; no focal neuro deficit    Social History   Socioeconomic History  . Marital status: Widowed    Spouse name: Not on file  . Number of children: 1  . Years of education: Not on file  . Highest education level: Not on file  Occupational History  . Occupation: retired  Scientific laboratory technician  . Financial resource strain: Not on file  . Food insecurity:    Worry: Not on file    Inability: Not on file  . Transportation needs:    Medical: Not on file    Non-medical: Not on file  Tobacco Use  . Smoking status: Never Smoker  . Smokeless tobacco: Never Used  Substance and Sexual Activity  . Alcohol use: No    Alcohol/week: 0.0 standard drinks  . Drug use: No  . Sexual activity: Not on file  Lifestyle  . Physical activity:    Days per week: Not on file    Minutes per session: Not on file  . Stress: Not on file  Relationships  . Social connections:    Talks on phone: Not on file    Gets together: Not on file    Attends religious service: Not on file    Active member of club or organization: Not on file    Attends meetings of clubs or organizations: Not on file    Relationship status: Not on file  .  Intimate partner violence:    Fear of current or ex partner: Not on file    Emotionally abused: Not on file    Physically abused: Not on file    Forced sexual activity: Not on file  Other Topics Concern  . Not on file  Social History Narrative  . Not on file     Family History  Problem Relation Age of Onset  . Heart attack Father   . Heart disease Mother   . Alzheimer's  disease Mother   . Heart disease Brother   . Colon cancer Maternal Grandfather   . Stomach cancer Neg Hx   . Rectal cancer Neg Hx   . Esophageal cancer Neg Hx   . Liver cancer Neg Hx      Review of Systems: General: negative for chills, fever, night sweats or weight changes.  Cardiovascular: negative for chest pain, dyspnea on exertion, edema, orthopnea, palpitations, paroxysmal nocturnal dyspnea or shortness of breath Dermatological: negative for rash Respiratory: negative for cough or wheezing Urologic: negative for hematuria Abdominal: negative for nausea, vomiting, diarrhea, bright red blood per rectum, melena, or hematemesis Neurologic: negative for visual changes, syncope, or dizziness All other systems reviewed and are otherwise negative except as noted above.    Blood pressure (!) 160/95, pulse 79, height 5\' 5"  (1.651 m), weight 150 lb (68 kg).  General appearance: alert, cooperative, appears stated age and no distress Neck: no carotid bruit and no JVD Lungs: clear to auscultation bilaterally Heart: regular rate and rhythm Extremities: no edema Skin: cool and dry Neurologic: Grossly normal, slight head shake tremor   ASSESSMENT AND PLAN:   CAD S/P percutaneous coronary angioplasty Non-STEMI January 2018 in the setting of a hip fracture Catheterization 06/01/16 showing 90% mid LAD stenosis-DES NSTEMI 08/13/17- cath mLAD and pLAD PCI with DES Both sites patent 11/29/17- residual Dx1 and Dx2 disease (smal vessels) Myoview low risk Aug 2019  Essential hypertension Repeat B/P by me 160/70. She  assures me her B/P at home runs 115/70.   Dyslipidemia, goal LDL below 70 LDL 69 on Crestor-April 2019   PLAN  I offered to try her on a lower dose of Coreg to see if that helped her fatigue.I explained we would need to add something else for HTN if we did that. She prefers to stay the course on her current medications. F/U with Dr [name] in 6 months.   Kerin Ransom PA-C 03/01/2018 2:17 PM

## 2018-03-01 NOTE — Patient Instructions (Signed)
Medication Instructions:  Your physician recommends that you continue on your current medications as directed. Please refer to the Current Medication list given to you today.  If you need a refill on your cardiac medications before your next appointment, please call your pharmacy.   Lab work: None  If you have labs (blood work) drawn today and your tests are completely normal, you will receive your results only by: . MyChart Message (if you have MyChart) OR . A paper copy in the mail If you have any lab test that is abnormal or we need to change your treatment, we will call you to review the results.  Testing/Procedures: None   Follow-Up: At [facility], you and your health needs are our priority.  As part of our continuing mission to provide you with exceptional heart care, we have created designated Provider Care Teams.  These Care Teams include your primary Cardiologist (physician) and Advanced Practice Providers (APPs -  Physician Assistants and Nurse Practitioners) who all work together to provide you with the care you need, when you need it. You will need a follow up appointment in 6 months.  Please call our office 2 months in advance to schedule this appointment.  You may see Jonathan Berry, MD or one of the following Advanced Practice Providers on your designated Care Team:   Luke Kilroy, PA-C Krista Kroeger, PA-C . Callie Goodrich, PA-C  Any Other Special Instructions Will Be Listed Below (If Applicable).   

## 2018-03-02 ENCOUNTER — Ambulatory Visit (HOSPITAL_COMMUNITY): Payer: PPO

## 2018-03-02 ENCOUNTER — Ambulatory Visit: Payer: PPO | Admitting: Cardiovascular Disease

## 2018-03-04 ENCOUNTER — Ambulatory Visit (HOSPITAL_COMMUNITY): Payer: PPO

## 2018-03-07 ENCOUNTER — Ambulatory Visit (HOSPITAL_COMMUNITY): Payer: PPO

## 2018-03-09 ENCOUNTER — Ambulatory Visit (HOSPITAL_COMMUNITY): Payer: PPO

## 2018-04-11 ENCOUNTER — Encounter: Payer: PPO | Admitting: Oncology

## 2018-04-14 DIAGNOSIS — D509 Iron deficiency anemia, unspecified: Secondary | ICD-10-CM | POA: Diagnosis not present

## 2018-04-14 DIAGNOSIS — K8681 Exocrine pancreatic insufficiency: Secondary | ICD-10-CM | POA: Diagnosis not present

## 2018-04-14 DIAGNOSIS — K449 Diaphragmatic hernia without obstruction or gangrene: Secondary | ICD-10-CM | POA: Diagnosis not present

## 2018-04-14 DIAGNOSIS — R197 Diarrhea, unspecified: Secondary | ICD-10-CM | POA: Diagnosis not present

## 2018-04-26 ENCOUNTER — Telehealth: Payer: Self-pay | Admitting: Cardiovascular Disease

## 2018-04-26 MED ORDER — LOSARTAN POTASSIUM 100 MG PO TABS: 100.0000 mg | ORAL_TABLET | Freq: Every day | ORAL | 0 refills | Status: DC

## 2018-04-26 NOTE — Telephone Encounter (Signed)
New Message   Pt states she has not been feeling well and that she feels weak and fatigued. Pt is wanting a sooner appt that what her recall date is but wants the insurance to pay. Please call

## 2018-04-26 NOTE — Telephone Encounter (Signed)
Spoke with pt who states for about a week she has been feeling fatigue and weak. She states yesterday she felt very tired and nauseous in the afternoon. Pt states she hasn't been keeping a record of her BP but took it while on the phone. Pt's BP was 174/105 HR 73.  Pt denies any symptoms of headache or blurred vision. Per Dr. [place] DOD, pt is to increase her losartan to 100 mg daily and f/u with APP. Pt offered an appointment this thursday 03/30/19 with Kerin Ransom, Pa. Pt denied and states she only want to see Dr. [name]. Pt scheduled for 05/12/17. Pt verbalized she is going to see pcp tomorrow. Pt advised to make PCP aware of symptoms. Pt verbalized understanding.

## 2018-04-27 DIAGNOSIS — R197 Diarrhea, unspecified: Secondary | ICD-10-CM | POA: Diagnosis not present

## 2018-04-27 DIAGNOSIS — R300 Dysuria: Secondary | ICD-10-CM | POA: Diagnosis not present

## 2018-04-27 DIAGNOSIS — K8681 Exocrine pancreatic insufficiency: Secondary | ICD-10-CM | POA: Diagnosis not present

## 2018-04-27 DIAGNOSIS — I10 Essential (primary) hypertension: Secondary | ICD-10-CM | POA: Diagnosis not present

## 2018-04-27 DIAGNOSIS — R350 Frequency of micturition: Secondary | ICD-10-CM | POA: Diagnosis not present

## 2018-04-27 DIAGNOSIS — I214 Non-ST elevation (NSTEMI) myocardial infarction: Secondary | ICD-10-CM | POA: Diagnosis not present

## 2018-04-27 DIAGNOSIS — E785 Hyperlipidemia, unspecified: Secondary | ICD-10-CM | POA: Diagnosis not present

## 2018-05-12 ENCOUNTER — Encounter: Payer: Self-pay | Admitting: Cardiovascular Disease

## 2018-05-12 ENCOUNTER — Ambulatory Visit: Payer: Medicare Other | Admitting: Cardiovascular Disease

## 2018-05-12 DIAGNOSIS — Z955 Presence of coronary angioplasty implant and graft: Secondary | ICD-10-CM

## 2018-05-12 DIAGNOSIS — I10 Essential (primary) hypertension: Secondary | ICD-10-CM | POA: Diagnosis not present

## 2018-05-12 DIAGNOSIS — E785 Hyperlipidemia, unspecified: Secondary | ICD-10-CM

## 2018-05-12 NOTE — Patient Instructions (Signed)
Medication Instructions:  Your physician recommends that you continue on your current medications as directed. Please refer to the Current Medication list given to you today.  If you need a refill on your cardiac medications before your next appointment, please call your pharmacy.   Lab work: NONE If you have labs (blood work) drawn today and your tests are completely normal, you will receive your results only by: Marland Kitchen MyChart Message (if you have MyChart) OR . A paper copy in the mail If you have any lab test that is abnormal or we need to change your treatment, we will call you to review the results.  Testing/Procedures: NONE  Follow-Up: At [facility], you and your health needs are our priority.  As part of our continuing mission to provide you with exceptional heart care, we have created designated Provider Care Teams.  These Care Teams include your primary Cardiologist (physician) and Advanced Practice Providers (APPs -  Physician Assistants and Nurse Practitioners) who all work together to provide you with the care you need, when you need it. . You will need a follow up appointment in 6 months with Kerin Ransom, PA ans 12 months with Dr. [name].  Please call our office 2 months in advance to schedule each appointment.

## 2018-05-12 NOTE — Progress Notes (Signed)
05/12/2018 Dalores Weger   [birth year]  [id number]  Primary Physician Josetta Huddle, MD Primary Cardiologist: Lorretta Harp MD [name], [place]  HPI:  Caitlyn [name] is a 73 y.o.  mildly overweight Caucasian female who I been taking care of for many years. I last saw her in the office  01/07/2018.I did perform coronary angiography on her in 2002 demonstrating normal coronary arteries. She does have a strong family history of heart disease. She apparently fell and broke her hip on the ice several days ago and was admitted to the hospital with an intention to perform ORIF of her right hip by Dr. Lyla Glassing . She has had several episodes of chest pain with mildly elevated troponin. I performed cardiac catheterization on her 06/01/16 revealing a 90% mid LAD lesion which I stented with a Synergy drug-eluting stent with an excellent result. She underwent right ORIF for fractured hip one week later by Dr. Lyla Glassing on dualantiplatelettherapy.She underwent physical therapy and rehabilitation.  She was doing well until 08/13/2017 when she developed chest pain was brought to [facility]. She had a "non-STEMI with a very low troponin rise and minimal ST segment changes. The following day, 08/14/2017, she underwent a radial cath by Dr. Angelena Form revealing a 90% proximal LAD lesion, and a 70% lesion just proximal to the previous he placed mid LAD stent both of which were stented with synergy drug-eluting stents. Her EF was in the 35 to 40% range, significantly lower than it had been previously which I 6 suspect was related to "stunned myocardium". She does complain of some mild shortness of breath.  She was admitted to the hospital 11/28/2017 with unstable angina and underwent cardiac catheterization radially by Dr. Tamala Julian the following day revealing patent LAD stents, a jailed small diffusely diseased first diagonal branch with otherwise noncritical CAD and normal LV function.  A subsequent Myoview  stress test performed 12/24/2017 was low risk and nonischemic ischemic.  She saw Kerin Ransom in the office 12/09/2017 who prescribed low-dose Imdur which she failed to take because of fear of headaches.  She also has not been taking her carvedilol as prescribed.  Since I saw her in the office 4 months ago she is remained stable.  She has occasional chest pain which is not changed in frequency or severity.   Current Meds  Medication Sig  . ALBUTEROL SULFATE HFA IN Inhale 2 puffs into the lungs every 6 (six) hours as needed (shortness of breath/wheezing).  Marland Kitchen amLODipine (NORVASC) 2.5 MG tablet Take 2.5 mg by mouth daily.  Marland Kitchen aspirin 81 MG EC tablet Take 1 tablet (81 mg total) by mouth daily.  . Calcium Carb-Cholecalciferol (CALCIUM-VITAMIN D) 600-400 MG-UNIT TABS Take by mouth.  . carvedilol (COREG) 25 MG tablet Take 1 tablet (25 mg total) by mouth 2 (two) times daily with a meal. (Patient taking differently: Take 25 mg by mouth daily. 50mg  qd)  . Cholecalciferol (VITAMIN D PO) Take 1 tablet by mouth daily.  . citalopram (CELEXA) 10 MG tablet Take 10 mg by mouth daily.  . clopidogrel (PLAVIX) 75 MG tablet Take 1 tablet (75 mg total) by mouth daily with breakfast.  . fenofibrate 160 MG tablet Take 1 tablet (160 mg total) by mouth daily.  Marland Kitchen levothyroxine (SYNTHROID, LEVOTHROID) 88 MCG tablet Take 88 mcg by mouth daily before breakfast. Must be NAME BRAND "Synthroid"  . lipase/protease/amylase (CREON) 36000 UNITS CPEP capsule Take 36,000-72,000 Units by mouth as needed. Take one capsule (36,000 units)  by mouth every morning, may increase to two capsules (72,000 units) as needed for diarrhea  . losartan (COZAAR) 100 MG tablet Take 1 tablet (100 mg total) by mouth daily.  . methocarbamol (ROBAXIN) 500 MG tablet Take 1 tablet (500 mg total) by mouth every 6 (six) hours as needed for muscle spasms.  . nitroGLYCERIN (NITROSTAT) 0.4 MG SL tablet Place 0.4 mg under the tongue every 5 (five) minutes as needed for  chest pain.  . rosuvastatin (CRESTOR) 20 MG tablet Take 1 tablet (20 mg total) by mouth daily at 6 PM.  . [DISCONTINUED] ampicillin (PRINCIPEN) 500 MG capsule Take 500 mg by mouth 3 (three) times daily. Only when needed---not currently taking (03/01/18)  . [DISCONTINUED] Mometasone Furo-Formoterol Fum (DULERA IN) Inhale 1 puff into the lungs daily as needed (shortness of breath/wheezing).  . [DISCONTINUED] venlafaxine XR (EFFEXOR-XR) 150 MG 24 hr capsule Take 150-300 mg by mouth daily as needed.      Allergies  Allergen Reactions  . Cortisone Anaphylaxis    Some forms of cortisone  . Demerol [Meperidine] Anaphylaxis  . Monosodium Glutamate Anaphylaxis  . Morphine And Related Anaphylaxis, Nausea And Vomiting and Other (See Comments)  . Prednisone Anaphylaxis    confusion  . Shellfish Allergy Anaphylaxis  . Sulfa Antibiotics Anaphylaxis  . Buprenorphine Hcl Nausea And Vomiting and Hypertension    SHAKING  . Readi-Cat [Barium Sulfate] Diarrhea    Patient does not want to drink Readi-Cat because it gives her diarrhea  . Xanax [Alprazolam] Other (See Comments)    "makes me crazy"  . Benzoic Acid Nausea Only    Social History   Socioeconomic History  . Marital status: Widowed    Spouse name: Not on file  . Number of children: 1  . Years of education: Not on file  . Highest education level: Not on file  Occupational History  . Occupation: retired  Scientific laboratory technician  . Financial resource strain: Not on file  . Food insecurity:    Worry: Not on file    Inability: Not on file  . Transportation needs:    Medical: Not on file    Non-medical: Not on file  Tobacco Use  . Smoking status: Never Smoker  . Smokeless tobacco: Never Used  Substance and Sexual Activity  . Alcohol use: No    Alcohol/week: 0.0 standard drinks  . Drug use: No  . Sexual activity: Not on file  Lifestyle  . Physical activity:    Days per week: Not on file    Minutes per session: Not on file  . Stress: Not on  file  Relationships  . Social connections:    Talks on phone: Not on file    Gets together: Not on file    Attends religious service: Not on file    Active member of club or organization: Not on file    Attends meetings of clubs or organizations: Not on file    Relationship status: Not on file  . Intimate partner violence:    Fear of current or ex partner: Not on file    Emotionally abused: Not on file    Physically abused: Not on file    Forced sexual activity: Not on file  Other Topics Concern  . Not on file  Social History Narrative  . Not on file     Review of Systems: General: negative for chills, fever, night sweats or weight changes.  Cardiovascular: negative for chest pain, dyspnea on exertion, edema, orthopnea, palpitations,  paroxysmal nocturnal dyspnea or shortness of breath Dermatological: negative for rash Respiratory: negative for cough or wheezing Urologic: negative for hematuria Abdominal: negative for nausea, vomiting, diarrhea, bright red blood per rectum, melena, or hematemesis Neurologic: negative for visual changes, syncope, or dizziness All other systems reviewed and are otherwise negative except as noted above.    Blood pressure 110/66, pulse 81, height 5\' 5"  (1.651 m), weight 152 lb (68.9 kg).  General appearance: alert and no distress Neck: no adenopathy, no carotid bruit, no JVD, supple, symmetrical, trachea midline and thyroid not enlarged, symmetric, no tenderness/mass/nodules Lungs: clear to auscultation bilaterally Heart: regular rate and rhythm, S1, S2 normal, no murmur, click, rub or gallop Extremities: extremities normal, atraumatic, no cyanosis or edema Pulses: 2+ and symmetric Skin: Skin color, texture, turgor normal. No rashes or lesions Neurologic: Alert and oriented X 3, normal strength and tone. Normal symmetric reflexes. Normal coordination and gait  EKG not performed today  ASSESSMENT AND PLAN:   Essential hypertension History of  essential hypertension her blood pressure measured today at 110/66.  She is on amlodipine losartan, carvedilol .  Continue current meds.  Dyslipidemia, goal LDL below 70 History of dyslipidemia on fenofibrate Crestor with lipid profile performed 02/16/2018 revealing total cholesterol 160.  LDL of 80 and HDL 58.  Status post insertion of drug-eluting stent into left anterior descending (LAD) artery History of CAD status post non-STEMI January 2018 when she was admitted with a broken hip.  She had a mid LAD stent placed at that time.  She was readmitted April 2019 with chest pain and had another stent placed.  Cath done 11/28/2017 by Dr. Tamala Julian showed patent LAD stents with 70% narrowing at the origin of the first diagonal branch and second diagonal branch with normal LV function.  Myoview performed this August was low risk.  She has occasional chest pain which has not changed in frequency or severity.      Lorretta Harp MD FACP,FACC,FAHA, [facility] 05/12/2018 2:08 PM

## 2018-05-12 NOTE — Assessment & Plan Note (Signed)
History of essential hypertension her blood pressure measured today at 110/66.  She is on amlodipine losartan, carvedilol .  Continue current meds.

## 2018-05-12 NOTE — Assessment & Plan Note (Signed)
History of dyslipidemia on fenofibrate Crestor with lipid profile performed 02/16/2018 revealing total cholesterol 160.  LDL of 80 and HDL 58.

## 2018-05-12 NOTE — Assessment & Plan Note (Signed)
History of CAD status post non-STEMI January 2018 when she was admitted with a broken hip.  She had a mid LAD stent placed at that time.  She was readmitted April 2019 with chest pain and had another stent placed.  Cath done 11/28/2017 by Dr. Tamala Julian showed patent LAD stents with 70% narrowing at the origin of the first diagonal branch and second diagonal branch with normal LV function.  Myoview performed this August was low risk.  She has occasional chest pain which has not changed in frequency or severity.

## 2018-05-25 ENCOUNTER — Other Ambulatory Visit: Payer: PPO

## 2018-06-13 ENCOUNTER — Other Ambulatory Visit (HOSPITAL_COMMUNITY): Payer: Self-pay | Admitting: Physician Assistant

## 2018-06-28 ENCOUNTER — Ambulatory Visit: Payer: PPO | Admitting: Cardiovascular Disease

## 2018-07-05 ENCOUNTER — Telehealth: Payer: Self-pay | Admitting: Hematology

## 2018-07-05 ENCOUNTER — Encounter: Payer: Self-pay | Admitting: Hematology

## 2018-07-05 NOTE — Telephone Encounter (Signed)
A new hem appt has been scheduled for the pt to see Dr. Burr Medico on 3/19 at 145pm w/labs at 130pm. Pt aware to arrive 30 minutes early to be checked in on time.

## 2018-07-13 ENCOUNTER — Ambulatory Visit: Payer: Medicare Other | Admitting: Cardiovascular Disease

## 2018-07-19 ENCOUNTER — Encounter: Payer: Self-pay | Admitting: Internal Medicine

## 2018-07-25 ENCOUNTER — Encounter: Payer: Self-pay | Admitting: *Deleted

## 2018-07-26 ENCOUNTER — Other Ambulatory Visit: Payer: Self-pay | Admitting: Hematology

## 2018-07-26 DIAGNOSIS — D508 Other iron deficiency anemias: Secondary | ICD-10-CM

## 2018-07-28 ENCOUNTER — Inpatient Hospital Stay: Payer: Medicare Other | Attending: Internal Medicine

## 2018-07-28 ENCOUNTER — Encounter: Payer: Medicare Other | Admitting: Hematology

## 2018-08-02 ENCOUNTER — Telehealth: Payer: Self-pay | Admitting: Hematology

## 2018-08-02 NOTE — Addendum Note (Signed)
Addended by: Hulan Fray on: 08/02/2018 04:25 PM   Modules accepted: Orders

## 2018-08-02 NOTE — Telephone Encounter (Signed)
Lft vm to reschedule hem appt

## 2018-08-03 ENCOUNTER — Ambulatory Visit: Payer: Medicare Other | Admitting: Cardiovascular Disease

## 2018-08-28 ENCOUNTER — Emergency Department (HOSPITAL_COMMUNITY): Payer: PPO

## 2018-08-28 ENCOUNTER — Encounter (HOSPITAL_COMMUNITY): Payer: Self-pay

## 2018-08-28 ENCOUNTER — Emergency Department (HOSPITAL_COMMUNITY)
Admission: EM | Admit: 2018-08-28 | Discharge: 2018-08-28 | Disposition: A | Discharge status: Home / Self Care | Payer: PPO | Attending: Emergency Medicine | Admitting: Emergency Medicine

## 2018-08-28 ENCOUNTER — Other Ambulatory Visit: Payer: Self-pay

## 2018-08-28 DIAGNOSIS — Z7982 Long term (current) use of aspirin: Secondary | ICD-10-CM | POA: Diagnosis not present

## 2018-08-28 DIAGNOSIS — Z79899 Other long term (current) drug therapy: Secondary | ICD-10-CM | POA: Diagnosis not present

## 2018-08-28 DIAGNOSIS — S79911A Unspecified injury of right hip, initial encounter: Secondary | ICD-10-CM | POA: Diagnosis not present

## 2018-08-28 DIAGNOSIS — I10 Essential (primary) hypertension: Secondary | ICD-10-CM | POA: Diagnosis not present

## 2018-08-28 DIAGNOSIS — S79929A Unspecified injury of unspecified thigh, initial encounter: Secondary | ICD-10-CM | POA: Diagnosis not present

## 2018-08-28 DIAGNOSIS — M25551 Pain in right hip: Secondary | ICD-10-CM

## 2018-08-28 DIAGNOSIS — I2510 Atherosclerotic heart disease of native coronary artery without angina pectoris: Secondary | ICD-10-CM | POA: Insufficient documentation

## 2018-08-28 DIAGNOSIS — M79651 Pain in right thigh: Secondary | ICD-10-CM

## 2018-08-28 DIAGNOSIS — R52 Pain, unspecified: Secondary | ICD-10-CM | POA: Diagnosis not present

## 2018-08-28 DIAGNOSIS — M79604 Pain in right leg: Secondary | ICD-10-CM | POA: Diagnosis present

## 2018-08-28 MED ORDER — IBUPROFEN 400 MG PO TABS: 400.0000 mg | ORAL_TABLET | Freq: Three times a day (TID) | ORAL | 0 refills | Status: DC | PRN

## 2018-08-28 NOTE — Discharge Instructions (Addendum)
We saw in the ER for leg pain. The x-ray is not showing any abnormality. Please take ibuprofen 3 times a day as prescribed for the next 3 days, and then as needed. Additionally ice the area of pain and limit your usage of the leg.  Please call the orthopedic surgeon for an appointment on Thursday or Friday.

## 2018-08-28 NOTE — ED Triage Notes (Signed)
Just after awakening at ~0800 today pt. Felt sudden pain at mid-right thigh area so significant it "made me fall". She was subsequently able to go into and complete her shower with the use of her walker, which she normally does not need to use. She tells me she had right mid-sharft femur fracture repaired by Dr. Delfino Lovett two years ago and has done "very well ever since". She also tells me she has had three m.i.'s and sees Dr.  [name] for her heart issues. CMS intact all toes bilat. She is in no distress.

## 2018-08-28 NOTE — ED Provider Notes (Signed)
[facility] DEPT Provider Note   CSN: [id number] Arrival date & time: 08/28/18  1653    History   Chief Complaint Chief Complaint  Patient presents with  . Leg Pain    HPI Caitlyn [name] is a 73 y.o. female.     HPI  73 year old female comes with a chief complaint of right-sided thigh pain.  Patient has history of CAD, closed right hip fracture, anemia. She reports that she woke up this morning and started having pain at the exact location where she was hurting when she had her hip fracture.  Patient was unable to bear weight properly like she normally does.  The pain is located in the proximal location of her right hip.  She states that anytime she bears weight her pain gets worse.  She denies any associated numbness, tingling.  Patient denies any trauma.  No history of prior symptoms of similar nature.  Pt has no hx of PE, DVT and denies any exogenous hormone (testosterone / estrogen) use, long distance travels or surgery in the past 6 weeks, active cancer, recent immobilization.   Past Medical History:  Diagnosis Date  . Anemia   . Arthritis   . Bleeding diathesis ([place]) 02/22/2013  . Closed right hip fracture ([place]) 05/28/2016  . Coronary artery disease   . E coli enteritis   . GERD (gastroesophageal reflux disease)   . H/O hiatal hernia   . Head injury   . Hypertension   . Iron deficiency anemia, unspecified 02/22/2013  . Mass of right kidney 08/22/2013   2.5 cm solid lesion seen on 5/14 CT  . Radicular pain of thoracic region 08/22/2013   Left T-8 started after pushing heavy chest 1/15; intermittent; no focal neuro deficit    Patient Active Problem List   Diagnosis Date Noted  . Unstable angina ([place]) 11/28/2017  . Ischemic cardiomyopathy 08/24/2017  . Hypertension   . Head injury   . H/O hiatal hernia   . GERD (gastroesophageal reflux disease)   . E coli enteritis   . Coronary artery disease   . Arthritis   . Anemia   . GI  bleeding 06/29/2016  . Status post hip replacement, right 06/29/2016  . Acute GI bleeding   . History of cardiac cath   . CAD S/P percutaneous coronary angioplasty 06/07/2016  . Postop check   . Displaced intertrochanteric fracture of right femur, initial encounter for closed fracture ([place]) 06/03/2016  . Status post insertion of drug-eluting stent into left anterior descending (LAD) artery   . NSTEMI (non-ST elevated myocardial infarction) ([place])   . Chest pain 05/30/2016  . Hypothyroidism, acquired 05/28/2016  . Closed fracture of right hip ([place]) 05/28/2016  . Closed right hip fracture ([place]) 05/28/2016  . Essential hypertension 10/29/2015  . Dyslipidemia, goal LDL below 70 10/29/2015  . Mass of right kidney 08/22/2013  . Radicular pain of thoracic region 08/22/2013  . Iron deficiency anemia 02/22/2013  . Iron deficiency anemia, unspecified 02/22/2013  . Bleeding diathesis ([place]) 02/22/2013    Past Surgical History:  Procedure Laterality Date  . ABDOMINAL HYSTERECTOMY    . BREAST BIOPSY    . BREAST LUMPECTOMY Left   . BREAST REDUCTION SURGERY  1981  . BREAST SURGERY  1981   rt-nodes taken out-non cancer  . BREAST SURGERY  1981   rt lumpectomy  . CARDIAC CATHETERIZATION N/A 06/01/2016   Procedure: LEFT HEART CATH;  Surgeon: Lorretta Harp, MD;  Location: [facility];  Service: Cardiovascular;  Laterality: N/A;  . CARDIAC CATHETERIZATION N/A 06/01/2016   Procedure: Coronary Stent Intervention;  Surgeon: Lorretta Harp, MD;  Location: [facility];  Service: Cardiovascular;  Laterality: N/A;  . COLONOSCOPY    . CORONARY STENT INTERVENTION N/A 08/14/2017   Procedure: CORONARY STENT INTERVENTION;  Surgeon: Burnell Blanks, MD;  Location: [facility];  Service: Cardiovascular;  Laterality: N/A;  . FACIAL FRACTURE SURGERY  2000  . FEMUR IM NAIL Right 06/03/2016   Procedure: INTRAMEDULLARY (IM) NAIL FEMORAL;  Surgeon: Rod Can, MD;  Location: [facility];   Service: Orthopedics;  Laterality: Right;  . LEFT HEART CATH AND CORONARY ANGIOGRAPHY N/A 08/14/2017   Procedure: LEFT HEART CATH AND CORONARY ANGIOGRAPHY;  Surgeon: Burnell Blanks, MD;  Location: [facility];  Service: Cardiovascular;  Laterality: N/A;  . LEFT HEART CATH AND CORONARY ANGIOGRAPHY N/A 11/29/2017   Procedure: LEFT HEART CATH AND CORONARY ANGIOGRAPHY;  Surgeon: Belva Crome, MD;  Location: [facility];  Service: Cardiovascular;  Laterality: N/A;  . TONSILLECTOMY    . TRIGGER FINGER RELEASE Right 11/28/2013   Procedure: RELEASE TRIGGER FINGER/A-1 PULLEY RIGHT LONG FINGER;  Surgeon: Cammie Sickle, MD;  Location: [facility];  Service: Orthopedics;  Laterality: Right;  . URETHRA SURGERY     age 69     OB History   No obstetric history on file.      Home Medications    Prior to Admission medications   Medication Sig Start Date End Date Taking? Authorizing Provider  ALBUTEROL SULFATE HFA IN Inhale 2 puffs into the lungs every 6 (six) hours as needed (shortness of breath/wheezing).    [provider]  amLODipine (NORVASC) 2.5 MG tablet Take 2.5 mg by mouth daily.    [provider]  aspirin 81 MG EC tablet Take 1 tablet (81 mg total) by mouth daily. 08/16/17   Bhagat, Crista Luria, PA  Calcium Carb-Cholecalciferol (CALCIUM-VITAMIN D) 600-400 MG-UNIT TABS Take by mouth.    [provider]  carvedilol (COREG) 25 MG tablet Take 1 tablet (25 mg total) by mouth 2 (two) times daily with a meal. Patient taking differently: Take 25 mg by mouth daily. 50mg  qd 08/16/17   Leanor Kail, PA  Cholecalciferol (VITAMIN D PO) Take 1 tablet by mouth daily.    [provider]  citalopram (CELEXA) 10 MG tablet Take 10 mg by mouth daily.    [provider]  clopidogrel (PLAVIX) 75 MG tablet Take 1 tablet (75 mg total) by mouth daily with breakfast. 04/13/17   Lorretta Harp, MD  fenofibrate 160 MG tablet TAKE 1  TABLET BY MOUTH DAILY 06/14/18   Bhagat, Bhavinkumar, PA  ibuprofen (ADVIL) 400 MG tablet Take 1 tablet (400 mg total) by mouth every 8 (eight) hours as needed for moderate pain. Take 1 tablet tid for the first 3 days and then prn for severe pain 08/28/18   Varney Biles, MD  levothyroxine (SYNTHROID, LEVOTHROID) 88 MCG tablet Take 88 mcg by mouth daily before breakfast. Must be NAME BRAND "Synthroid"    [provider]  lipase/protease/amylase (CREON) 36000 UNITS CPEP capsule Take 60,630-16,010 Units by mouth as needed. Take one capsule (36,000 units) by mouth every morning, may increase to two capsules (72,000 units) as needed for diarrhea    [provider]  losartan (COZAAR) 100 MG tablet Take 1 tablet (100 mg total) by mouth daily. 04/26/18   Lorretta Harp, MD  methocarbamol (ROBAXIN) 500 MG tablet Take 1 tablet (500 mg total) by mouth every 6 (six) hours as needed for muscle spasms. 06/06/16   Eugenie Filler, MD  nitroGLYCERIN (NITROSTAT) 0.4 MG SL tablet Place 0.4 mg under the tongue every 5 (five) minutes as needed for chest pain.    [provider]  rosuvastatin (CRESTOR) 20 MG tablet Take 1 tablet (20 mg total) by mouth daily at 6 PM. 11/30/17   Kroeger, Lorelee Cover., PA-C    Family History Family History  Problem Relation Age of Onset  . Heart attack Father   . Heart disease Mother   . Alzheimer's disease Mother   . Heart disease Brother   . Colon cancer Maternal Grandfather   . Stomach cancer Neg Hx   . Rectal cancer Neg Hx   . Esophageal cancer Neg Hx   . Liver cancer Neg Hx     Social History Social History   Tobacco Use  . Smoking status: Never Smoker  . Smokeless tobacco: Never Used  Substance Use Topics  . Alcohol use: No    Alcohol/week: 0.0 standard drinks  . Drug use: No     Allergies   Cortisone; Demerol [meperidine]; Monosodium glutamate; Morphine and related; Prednisone; Shellfish allergy; Sulfa antibiotics; Buprenorphine hcl;  Readi-cat [barium sulfate]; Xanax [alprazolam]; and Benzoic acid   Review of Systems Review of Systems  Constitutional: Positive for activity change.  Musculoskeletal: Positive for arthralgias.     Physical Exam Updated Vital Signs BP (!) 190/99 (BP Location: Left Arm)   Pulse 88   Temp 99.4 F (37.4 C) (Oral)   Resp 16   Physical Exam Vitals signs and nursing note reviewed.  Constitutional:      Appearance: She is well-developed.  HENT:     Head: Normocephalic and atraumatic.  Neck:     Musculoskeletal: Normal range of motion and neck supple.  Cardiovascular:     Rate and Rhythm: Normal rate.  Pulmonary:     Effort: Pulmonary effort is normal.  Abdominal:     General: Bowel sounds are normal.  Musculoskeletal:        General: Tenderness present. No swelling.     Comments: Pt able to actively leg raise. She has tenderness to palpation over the proximal right thigh.  There is no deformity, edema, ecchymosis.  Distal pulses are 1+ over the DP and skin is warm to touch.   Skin:    General: Skin is warm.  Neurological:     Mental Status: She is alert.      ED Treatments / Results  Labs (all labs ordered are listed, but only abnormal results are displayed) Labs Reviewed - No data to display  EKG None  Radiology Dg Hip Unilat W Or Wo Pelvis 2-3 Views Right  Result Date: 08/28/2018 CLINICAL DATA:  Fall.  Left hip pain. EXAM: DG HIP (WITH OR WITHOUT PELVIS) 2-3V RIGHT COMPARISON:  06/03/2016 FINDINGS: Bones are diffusely demineralized. Status post ORIF for proximal right femur fracture. No evidence for acute fracture involving the bony pelvis. No proximal right femur fracture evident. IMPRESSION: Negative. Electronically Signed   By: Misty Stanley M.D.   On: 08/28/2018 18:02   Dg Femur Min 2 Views Right  Result Date: 08/28/2018 CLINICAL DATA:  Fall.  Right hip pain. EXAM: RIGHT FEMUR 2 VIEWS COMPARISON:  None. FINDINGS: Two-view of the right femur shows the patient  to be status post ORIF for femoral neck fracture. No evidence for hardware complication. No  acute fracture evident. No worrisome lytic or sclerotic osseous abnormality. IMPRESSION: Negative. Electronically Signed   By: Misty Stanley M.D.   On: 08/28/2018 18:01    Procedures Procedures (including critical care time)  Medications Ordered in ED Medications - No data to display   Initial Impression / Assessment and Plan / ED Course  I have reviewed the triage vital signs and the nursing notes.  Pertinent labs & imaging results that were available during my care of the patient were reviewed by me and considered in my medical decision making (see chart for details).        73 year old comes in a chief complaint of right sided thigh pain. On exam, it appears that her pain is likely coming from musculoskeletal source, however given that she has history of hip replacement, there could be hardware malfunction versus occult fracture.  Clinically there is no concerns for septic arthritis.  X-rays were ordered of the right hip and right femur.  There is no evidence of fracture or worries around the hardware.  We ambulated the patient, and she was able to walk with assistance.  Patient also stated on reassessment that she felt a lot better.  Plan is for patient to be treated conservatively.  There is still a possibility that there could be some hardware malfunction or occult fracture, but for now we will manage her conservatively and have her follow-up with her orthopedist.  If her symptoms are not improving then perhaps she will benefit by a CAT scan versus MRI. Patient understands importance of close follow-up and taking RICE precautions seriously.   Final Clinical Impressions(s) / ED Diagnoses   Final diagnoses:  Acute right hip pain  Acute pain of right thigh    ED Discharge Orders         Ordered    ibuprofen (ADVIL) 400 MG tablet  Every 8 hours PRN     08/28/18 1914            Varney Biles, MD 08/28/18 2126

## 2018-08-28 NOTE — ED Notes (Signed)
Bed: LD44 Expected date:  Expected time:  Means of arrival:  Comments: EMS; leg pain

## 2018-09-01 DIAGNOSIS — M7061 Trochanteric bursitis, right hip: Secondary | ICD-10-CM | POA: Diagnosis not present

## 2018-09-06 ENCOUNTER — Ambulatory Visit: Payer: PPO | Admitting: Cardiovascular Disease

## 2018-09-06 ENCOUNTER — Telehealth: Payer: Self-pay

## 2018-09-06 ENCOUNTER — Telehealth (INDEPENDENT_AMBULATORY_CARE_PROVIDER_SITE_OTHER): Payer: PPO | Admitting: Cardiovascular Disease

## 2018-09-06 ENCOUNTER — Telehealth: Payer: Self-pay | Admitting: *Deleted

## 2018-09-06 VITALS — BP 176/109 | HR 88 | Ht 64.0 in | Wt 149.0 lb

## 2018-09-06 DIAGNOSIS — Z9861 Coronary angioplasty status: Secondary | ICD-10-CM

## 2018-09-06 DIAGNOSIS — I2510 Atherosclerotic heart disease of native coronary artery without angina pectoris: Secondary | ICD-10-CM | POA: Diagnosis not present

## 2018-09-06 DIAGNOSIS — E785 Hyperlipidemia, unspecified: Secondary | ICD-10-CM

## 2018-09-06 DIAGNOSIS — I10 Essential (primary) hypertension: Secondary | ICD-10-CM | POA: Diagnosis not present

## 2018-09-06 MED ORDER — HYDROCHLOROTHIAZIDE 25 MG PO TABS: 12.5000 mg | ORAL_TABLET | Freq: Every day | ORAL | 3 refills | Status: DC

## 2018-09-06 MED ORDER — ROSUVASTATIN CALCIUM 40 MG PO TABS: 40.0000 mg | ORAL_TABLET | Freq: Every day | ORAL | 3 refills | Status: DC

## 2018-09-06 NOTE — Telephone Encounter (Signed)
Virtual Visit Pre-Appointment Phone Call  "(Name), I am calling you today to discuss your upcoming appointment. We are currently trying to limit exposure to the virus that causes COVID-19 by seeing patients at home rather than in the office."  1. "What is the BEST phone number to call the day of the visit?" - include this in appointment notes  2. Do you have or have access to (through a family member/friend) a smartphone with video capability that we can use for your visit?" a. If yes - list this number in appt notes as cell (if different from BEST phone #) and list the appointment type as a VIDEO visit in appointment notes b. If no - list the appointment type as a PHONE visit in appointment notes  3. Confirm consent - "In the setting of the current Covid19 crisis, you are scheduled for a (phone or video) visit with your provider on (date) at (time).  Just as we do with many in-office visits, in order for you to participate in this visit, we must obtain consent.  If you'd like, I can send this to your mychart (if signed up) or email for you to review.  Otherwise, I can obtain your verbal consent now.  All virtual visits are billed to your insurance company just like a normal visit would be.  By agreeing to a virtual visit, we'd like you to understand that the technology does not allow for your provider to perform an examination, and thus may limit your provider's ability to fully assess your condition. If your provider identifies any concerns that need to be evaluated in person, we will make arrangements to do so.  Finally, though the technology is pretty good, we cannot assure that it will always work on either your or our end, and in the setting of a video visit, we may have to convert it to a phone-only visit.  In either situation, we cannot ensure that we have a secure connection.  Are you willing to proceed?" STAFF: Did the patient verbally acknowledge consent to telehealth visit? Document  YES/NO here: YES  4. Advise patient to be prepared - "Two hours prior to your appointment, go ahead and check your blood pressure, pulse, oxygen saturation, and your weight (if you have the equipment to check those) and write them all down. When your visit starts, your provider will ask you for this information. If you have an Apple Watch or Kardia device, please plan to have heart rate information ready on the day of your appointment. Please have a pen and paper handy nearby the day of the visit as well."  5. Give patient instructions for MyChart download to smartphone OR Doximity/Doxy.me as below if video visit (depending on what platform provider is using)  6. Inform patient they will receive a phone call 15 minutes prior to their appointment time (may be from unknown caller ID) so they should be prepared to answer    TELEPHONE CALL NOTE  Emanuella Nickle has been deemed a candidate for a follow-up tele-health visit to limit community exposure during the Covid-19 pandemic. I spoke with the patient via phone to ensure availability of phone/video source, confirm preferred email & phone number, and discuss instructions and expectations.  I reminded Gisela Lea to be prepared with any vital sign and/or heart rhythm information that could potentially be obtained via home monitoring, at the time of her visit. I reminded Nyanna Heideman to expect a phone call prior to her visit.  Venetia Maxon, CMA 09/06/2018 1:48 PM   INSTRUCTIONS FOR DOWNLOADING THE MYCHART APP TO SMARTPHONE  - The patient must first make sure to have activated MyChart and know their login information - If Apple, go to [employer] and type in MyChart in the search bar and download the app. If Android, ask patient to go to [employer] and type in [place] in the search bar and download the app. The app is free but as with any other app downloads, their phone may require them to verify saved payment information or Apple/Android  password.  - The patient will need to then log into the app with their MyChart username and password, and select [facility] as their healthcare provider to link the account. When it is time for your visit, go to the MyChart app, find appointments, and click Begin Video Visit. Be sure to Select Allow for your device to access the Microphone and Camera for your visit. You will then be connected, and your provider will be with you shortly.  **If they have any issues connecting, or need assistance please contact MyChart service desk [phone])**  **If using a computer, in order to ensure the best quality for their visit they will need to use either of the following Internet Browsers: [employer], or Google Chrome**  IF USING DOXIMITY or DOXY.ME - The patient will receive a link just prior to their visit by text.     FULL LENGTH CONSENT FOR TELE-HEALTH VISIT   I hereby voluntarily request, consent and authorize [facility] and its employed or contracted physicians, physician assistants, nurse practitioners or other licensed health care professionals (the Practitioner), to provide me with telemedicine health care services (the Services") as deemed necessary by the treating Practitioner. I acknowledge and consent to receive the Services by the Practitioner via telemedicine. I understand that the telemedicine visit will involve communicating with the Practitioner through live audiovisual communication technology and the disclosure of certain medical information by electronic transmission. I acknowledge that I have been given the opportunity to request an in-person assessment or other available alternative prior to the telemedicine visit and am voluntarily participating in the telemedicine visit.  I understand that I have the right to withhold or withdraw my consent to the use of telemedicine in the course of my care at any time, without affecting my right to future care or treatment,  and that the Practitioner or I may terminate the telemedicine visit at any time. I understand that I have the right to inspect all information obtained and/or recorded in the course of the telemedicine visit and may receive copies of available information for a reasonable fee.  I understand that some of the potential risks of receiving the Services via telemedicine include:   Delay or interruption in medical evaluation due to technological equipment failure or disruption;  Information transmitted may not be sufficient (e.g. poor resolution of images) to allow for appropriate medical decision making by the Practitioner; and/or   In rare instances, security protocols could fail, causing a breach of personal health information.  Furthermore, I acknowledge that it is my responsibility to provide information about my medical history, conditions and care that is complete and accurate to the best of my ability. I acknowledge that Practitioner's advice, recommendations, and/or decision may be based on factors not within their control, such as incomplete or inaccurate data provided by me or distortions of diagnostic images or specimens that may result from electronic transmissions. I understand that  the practice of medicine is not an exact science and that Practitioner makes no warranties or guarantees regarding treatment outcomes. I acknowledge that I will receive a copy of this consent concurrently upon execution via email to the email address I last provided but may also request a printed copy by calling the office of [facility].    I understand that my insurance will be billed for this visit.   I have read or had this consent read to me.  I understand the contents of this consent, which adequately explains the benefits and risks of the Services being provided via telemedicine.   I have been provided ample opportunity to ask questions regarding this consent and the Services and have had my questions  answered to my satisfaction.  I give my informed consent for the services to be provided through the use of telemedicine in my medical care  By participating in this telemedicine visit I agree to the above.   Cardiac Questionnaire:    Since your last visit or hospitalization:    1. Have you been having new or worsening chest pain? Yes was in front between her breast felt like a clap.   2. Have you been having new or worsening shortness of breath? YES 3. Have you been having new or worsening leg swelling, wt gain, or increase in abdominal girth (pants fitting more tightly)? NO   4. Have you had any passing out spells? NO    *A YES to any of these questions would result in the appointment being kept. *If all the answers to these questions are NO, we should indicate that given the current situation regarding the worldwide coronarvirus pandemic, at the recommendation of the CDC, we are looking to limit gatherings in our waiting area, and thus will reschedule their appointment beyond four weeks from today.   _____________   COVID-19 Pre-Screening Questions:   Do you currently have a fever? NO  Have you recently travelled on a cruise, internationally, or to [place], [place], [place], [place], [place], or [place], [place] [employer]) ? NO  Have you been in contact with someone that is currently pending confirmation of Covid19 testing or has been confirmed to have the [place] virus?  NO  Are you currently experiencing fatigue or cough? NO

## 2018-09-06 NOTE — Telephone Encounter (Signed)
Patient and/or DPR-approved person aware of AVS instructions and verbalized understanding. Letter including After Visit Summary and any other necessary documents TO BE mailed to the patient's address on file; secure email also sent to pt email on file

## 2018-09-06 NOTE — Progress Notes (Signed)
Virtual Visit via Video Note   This visit type was conducted due to national recommendations for restrictions regarding the COVID-19 Pandemic (e.g. social distancing) in an effort to limit this patient's exposure and mitigate transmission in our community.  Due to her co-morbid illnesses, this patient is at least at moderate risk for complications without adequate follow up.  This format is felt to be most appropriate for this patient at this time.  All issues noted in this document were discussed and addressed.  A limited physical exam was performed with this format.  Please refer to the patient's chart for her consent to telehealth for [facility].   Evaluation Performed:  Follow-up visit  Date:  09/06/2018   ID:  Caitlyn [name], Caitlyn [name] [birth year], MRN [id number]  Patient Location: Home Provider Location: Home  PCP:  Josetta Huddle, MD  Cardiologist:  Quay Burow, MD  Electrophysiologist:  None   Chief Complaint: Hypertension/CAD  History of Present Illness:    Caitlyn [name] is a 73 y.o.  mildly overweight Caucasian female who I been taking care of for many years. I last saw her in the office  05/12/2018.I did perform coronary angiography on her in 2002 demonstrating normal coronary arteries. She does have a strong family history of heart disease. She apparently fell and broke her hip on the ice several days ago and was admitted to the hospital with an intention to perform ORIF of her right hip by Dr. Lyla Glassing . She has had several episodes of chest pain with mildly elevated troponin. I performed cardiac catheterization on her 06/01/16 revealing a 90% mid LAD lesion which I stented with a Synergy drug-eluting stent with an excellent result. She underwent right ORIF for fractured hip one week later by Dr. Lyla Glassing on dualantiplatelettherapy.She underwent physical therapy and rehabilitation.  She was doing well until 08/13/2017 when she developed chest pain was brought to [facility]. She had a "non-STEMI with a very low troponin rise and minimal ST segment changes. The following day, 08/14/2017, she underwent a radial cath by Dr. Angelena Form revealing a 90% proximal LAD lesion, and a 70% lesion just proximal to the previous he placed mid LAD stent both of which were stented with synergy drug-eluting stents. Her EF was in the 35 to 40% range, significantly lower than it had been previously which I 6 suspect was related to "stunned myocardium". She does complain of some mild shortness of breath.  She was admitted to the hospital 11/28/2017 with unstable angina and underwent cardiac catheterization radially by Dr. Tamala Julian the following day revealing patent LAD stents, a jailed small diffusely diseased first diagonal branch with otherwise noncritical CAD and normal LV function. A subsequent Myoview stress test performed 12/24/2017 was low risk and nonischemic ischemic. She saw Kerin Ransom in the office 12/09/2017 who prescribed low-dose Imdur which she failed to take because of fear of headaches. She also has not been taking her carvedilol as prescribed.  Since I saw her in the office 4 months ago she is remained stable.  She has occasional chest pain which is not changed in frequency or severity.  Her blood pressure however has been moderately elevated.  Today it measured 176/109 with a pulse of 88.  She is also had some bursitis in the hip that she broke and has seen Dr. Lyla Glassing for this.   The patient does not have symptoms concerning for COVID-19 infection (fever, chills, cough, or new shortness of breath).    Past Medical History:  Diagnosis Date  . Anemia   . Arthritis   . Bleeding diathesis ([place]) 02/22/2013  . Closed right hip fracture ([place]) 05/28/2016  . Coronary artery disease   . E coli enteritis   . GERD (gastroesophageal reflux disease)   . H/O hiatal hernia   . Head injury   . Hypertension   . Iron deficiency anemia, unspecified 02/22/2013  . Mass of  right kidney 08/22/2013   2.5 cm solid lesion seen on 5/14 CT  . Radicular pain of thoracic region 08/22/2013   Left T-8 started after pushing heavy chest 1/15; intermittent; no focal neuro deficit   Past Surgical History:  Procedure Laterality Date  . ABDOMINAL HYSTERECTOMY    . BREAST BIOPSY    . BREAST LUMPECTOMY Left   . BREAST REDUCTION SURGERY  1981  . BREAST SURGERY  1981   rt-nodes taken out-non cancer  . BREAST SURGERY  1981   rt lumpectomy  . CARDIAC CATHETERIZATION N/A 06/01/2016   Procedure: LEFT HEART CATH;  Surgeon: Lorretta Harp, MD;  Location: [facility];  Service: Cardiovascular;  Laterality: N/A;  . CARDIAC CATHETERIZATION N/A 06/01/2016   Procedure: Coronary Stent Intervention;  Surgeon: Lorretta Harp, MD;  Location: [facility];  Service: Cardiovascular;  Laterality: N/A;  . COLONOSCOPY    . CORONARY STENT INTERVENTION N/A 08/14/2017   Procedure: CORONARY STENT INTERVENTION;  Surgeon: Burnell Blanks, MD;  Location: [facility];  Service: Cardiovascular;  Laterality: N/A;  . FACIAL FRACTURE SURGERY  2000  . FEMUR IM NAIL Right 06/03/2016   Procedure: INTRAMEDULLARY (IM) NAIL FEMORAL;  Surgeon: Rod Can, MD;  Location: [facility];  Service: Orthopedics;  Laterality: Right;  . LEFT HEART CATH AND CORONARY ANGIOGRAPHY N/A 08/14/2017   Procedure: LEFT HEART CATH AND CORONARY ANGIOGRAPHY;  Surgeon: Burnell Blanks, MD;  Location: [facility];  Service: Cardiovascular;  Laterality: N/A;  . LEFT HEART CATH AND CORONARY ANGIOGRAPHY N/A 11/29/2017   Procedure: LEFT HEART CATH AND CORONARY ANGIOGRAPHY;  Surgeon: Belva Crome, MD;  Location: [facility];  Service: Cardiovascular;  Laterality: N/A;  . TONSILLECTOMY    . TRIGGER FINGER RELEASE Right 11/28/2013   Procedure: RELEASE TRIGGER FINGER/A-1 PULLEY RIGHT LONG FINGER;  Surgeon: Cammie Sickle, MD;  Location: [facility];  Service: Orthopedics;   Laterality: Right;  . URETHRA SURGERY     age 73     Current Meds  Medication Sig  . ALBUTEROL SULFATE HFA IN Inhale 2 puffs into the lungs every 6 (six) hours as needed (shortness of breath/wheezing).  Marland Kitchen aspirin 81 MG EC tablet Take 1 tablet (81 mg total) by mouth daily.  . Calcium Carb-Cholecalciferol (CALCIUM-VITAMIN D) 600-400 MG-UNIT TABS Take by mouth.  . carvedilol (COREG) 25 MG tablet Take 1 tablet (25 mg total) by mouth 2 (two) times daily with a meal. (Patient taking differently: Take 25 mg by mouth daily. 50mg  qd)  . Cholecalciferol (VITAMIN D PO) Take 1 tablet by mouth daily.  . citalopram (CELEXA) 10 MG tablet Take 10 mg by mouth daily.  . clopidogrel (PLAVIX) 75 MG tablet Take 1 tablet (75 mg total) by mouth daily with breakfast.  . fenofibrate 160 MG tablet TAKE 1 TABLET BY MOUTH DAILY  . ibuprofen (ADVIL) 400 MG tablet Take 1 tablet (400 mg total) by mouth every 8 (eight) hours as needed for moderate pain. Take 1 tablet tid for the first 3 days and  then prn for severe pain  . levothyroxine (SYNTHROID, LEVOTHROID) 88 MCG tablet Take 88 mcg by mouth daily before breakfast. Must be NAME BRAND "Synthroid"  . lipase/protease/amylase (CREON) 36000 UNITS CPEP capsule Take 36,000-72,000 Units by mouth as needed. Take one capsule (36,000 units) by mouth every morning, may increase to two capsules (72,000 units) as needed for diarrhea  . losartan (COZAAR) 100 MG tablet Take 1 tablet (100 mg total) by mouth daily.  . methocarbamol (ROBAXIN) 500 MG tablet Take 1 tablet (500 mg total) by mouth every 6 (six) hours as needed for muscle spasms.  . nitroGLYCERIN (NITROSTAT) 0.4 MG SL tablet Place 0.4 mg under the tongue every 5 (five) minutes as needed for chest pain.  . rosuvastatin (CRESTOR) 20 MG tablet Take 1 tablet (20 mg total) by mouth daily at 6 PM.  . [DISCONTINUED] amLODipine (NORVASC) 2.5 MG tablet Take 2.5 mg by mouth daily.     Allergies:   Cortisone; Demerol [meperidine];  Monosodium glutamate; Morphine and related; Prednisone; Shellfish allergy; Sulfa antibiotics; Buprenorphine hcl; Readi-cat [barium sulfate]; Xanax [alprazolam]; and Benzoic acid   Social History   Tobacco Use  . Smoking status: Never Smoker  . Smokeless tobacco: Never Used  Substance Use Topics  . Alcohol use: No    Alcohol/week: 0.0 standard drinks  . Drug use: No     Family Hx: The patient's family history includes Alzheimer's disease in her mother; Colon cancer in her maternal grandfather; Heart attack in her father; Heart disease in her brother and mother. There is no history of Stomach cancer, Rectal cancer, Esophageal cancer, or Liver cancer.  ROS:   Please see the history of present illness.     All other systems reviewed and are negative.   Prior CV studies:   The following studies were reviewed today:  None  Labs/Other Tests and Data Reviewed:    EKG:  No ECG reviewed.  Recent Labs: 11/28/2017: ALT 18 11/29/2017: TSH 0.683 11/30/2017: BUN 23; Creatinine, Ser 0.86; Hemoglobin 10.6; Platelets 204; Potassium 3.7; Sodium 139   Recent Lipid Panel Lab Results  Component Value Date/Time   CHOL 149 11/29/2017 07:36 AM   TRIG 154 (H) 11/29/2017 07:36 AM   HDL 49 11/29/2017 07:36 AM   CHOLHDL 3.0 11/29/2017 07:36 AM   LDLCALC 69 11/29/2017 07:36 AM    Wt Readings from Last 3 Encounters:  09/06/18 149 lb (67.6 kg)  05/12/18 152 lb (68.9 kg)  03/01/18 150 lb (68 kg)     Objective:    Vital Signs:  BP (!) 176/109   Pulse 88   Ht 5\' 4"  (1.626 m)   Wt 149 lb (67.6 kg)   BMI 25.58 kg/m    VITAL SIGNS:  reviewed GEN:  no acute distress RESPIRATORY:  normal respiratory effort, symmetric expansion NEURO:  alert and oriented x 3, no obvious focal deficit PSYCH:  normal affect  ASSESSMENT & PLAN:    1. Coronary artery disease- history of CAD status post normal cath by myself in 2002, PCI and drug-eluting stenting of the mid LAD by myself 06/01/2016 in the setting  of non-STEMI.  She had cardiac cath 08/13/2017 by Dr. Angelena Form in the setting of non-STEMI and had 2 additional LAD stents placed.  EF at that time was 35 to 40%.  Her last catheterization performed 11/28/2017 by Dr. Tamala Julian revealed patent stents.  She had a low risk Myoview 12/24/2017.  She is had 2 episodes of atypical chest pain since I last saw her  4 months ago.  She remains on dual antiplatelet therapy. 2. Essential hypertension- history of essential hypertension on losartan and carvedilol.  Her blood pressure today that she measured at home was 176/109 with a pulse of 88.  She has been on amlodipine in the past but this causes lower extremity edema.  I am going to begin her on a low-dose diuretic (hydrochlorothiazide 12.5 mg) and check a basic metabolic panel in 7 to 10 days.  She will keep a blood pressure log over the next 30 days and we will have Kristen call her back to review make any medicine changes with titration. 3. Hyperlipidemia- history of hyperlipidemia on Crestor and fenofibrate.  Her last lipid profile performed 02/16/2018 revealed total cholesterol 160, LDL of 80 and HDL of 58.  I am going to increase her Crestor from 20 to 40 mg a day and we will recheck a lipid liver profile in 2 to 3 months.  COVID-19 Education: The signs and symptoms of COVID-19 were discussed with the patient and how to seek care for testing (follow up with PCP or arrange E-visit).  The importance of social distancing was discussed today.  Time:   Today, I have spent 9 minutes with the patient with telehealth technology discussing the above problems.     Medication Adjustments/Labs and Tests Ordered: Current medicines are reviewed at length with the patient today.  Concerns regarding medicines are outlined above.   Tests Ordered: No orders of the defined types were placed in this encounter.   Medication Changes: No orders of the defined types were placed in this encounter.   Disposition:  Follow up in 1  month(s)  Signed, Quay Burow, MD  09/06/2018 3:30 PM    [place]

## 2018-09-06 NOTE — Patient Instructions (Signed)
Medication Instructions:  Your physician has recommended you make the following change in your medication:   START HYDROCHLOROTHIAZIDE 12.5 MG. CUT THE 25 MG TABLET IN HALF TO TAKE 12.5 MG BY MOUTH DAILY.  INCREASE YOUR ROSUVASTATIN (CRESTOR) TO 40 MG BY MOUTH DAILY   If you need a refill on your cardiac medications before your next appointment, please call your pharmacy.   Lab work: Your physician recommends that you return for lab work in 7-10 days: [facility] physician recommends that you return for lab work in 2-3 months: [facility] LIVER FUNCTION TEST. YOU WILL RECEIVE A LAB SLIP IN THE MAIL. PLEASE DO NOT EAT OR DRINK (EXCEPT WATER) ANYTHING AFTER MIDNIGHT ON THE DAY YOU CHOOSE TO PRESENT FOR LAB WORK. YOU MAY EAT AFTER YOUR BLOOD HAS BEEN COLLECTED. NO APPOINTMENT IS NEEDED.   If you have labs (blood work) drawn today and your tests are completely normal, you will receive your results only by: Marland Kitchen MyChart Message (if you have MyChart) OR . A paper copy in the mail If you have any lab test that is abnormal or we need to change your treatment, we will call you to review the results.  Testing/Procedures: NONE  Follow-Up: At [facility], you and your health needs are our priority.  As part of our continuing mission to provide you with exceptional heart care, we have created designated Provider Care Teams.  These Care Teams include your primary Cardiologist (physician) and Advanced Practice Providers (APPs -  Physician Assistants and Nurse Practitioners) who all work together to provide you with the care you need, when you need it. . You will need a follow up appointment in 3 months with an APP and in 6 months with Dr. [name].  Please call our office 2 months in advance to schedule this appointment.  You may see one of the following Advanced Practice Providers on your designated Care Team:   . Kerin Ransom, [place] . Almyra Deforest, PA-C . Fabian Sharp,  PA-C . Jory Sims, DNP . Rosaria Ferries, PA-C . Roby Lofts, PA-C . Sande Rives, PA-C  Any Other Special Instructions Will Be Listed Below (If Applicable). KEEP A DAILY BLOOD PRESSURE LOG FOR 30 DAYS THEN FOLLOW UP WITH A CLINICAL PHARMACIST IN THE HYPERTENSION CLINIC. YOU WILL NEED AN APPOINTMENT.

## 2018-09-12 ENCOUNTER — Other Ambulatory Visit: Payer: Self-pay | Admitting: Cardiovascular Disease

## 2018-09-13 DIAGNOSIS — E785 Hyperlipidemia, unspecified: Secondary | ICD-10-CM | POA: Diagnosis not present

## 2018-09-13 DIAGNOSIS — J4520 Mild intermittent asthma, uncomplicated: Secondary | ICD-10-CM | POA: Diagnosis not present

## 2018-09-13 DIAGNOSIS — E039 Hypothyroidism, unspecified: Secondary | ICD-10-CM | POA: Diagnosis not present

## 2018-09-13 DIAGNOSIS — F322 Major depressive disorder, single episode, severe without psychotic features: Secondary | ICD-10-CM | POA: Diagnosis not present

## 2018-09-13 DIAGNOSIS — I10 Essential (primary) hypertension: Secondary | ICD-10-CM | POA: Diagnosis not present

## 2018-09-13 DIAGNOSIS — F329 Major depressive disorder, single episode, unspecified: Secondary | ICD-10-CM | POA: Diagnosis not present

## 2018-09-13 DIAGNOSIS — I214 Non-ST elevation (NSTEMI) myocardial infarction: Secondary | ICD-10-CM | POA: Diagnosis not present

## 2018-09-13 DIAGNOSIS — D649 Anemia, unspecified: Secondary | ICD-10-CM | POA: Diagnosis not present

## 2018-09-13 DIAGNOSIS — D509 Iron deficiency anemia, unspecified: Secondary | ICD-10-CM | POA: Diagnosis not present

## 2018-09-15 ENCOUNTER — Telehealth: Payer: Self-pay | Admitting: Cardiovascular Disease

## 2018-09-15 NOTE — Telephone Encounter (Signed)
Patient would like to know if Dr [name] could recommend a good neurologist here in [facility]

## 2018-09-16 NOTE — Telephone Encounter (Signed)
Dr Jannifer Franklin at [facility]

## 2018-09-22 NOTE — Telephone Encounter (Signed)
Spoke with pt and she is aware of recommendation

## 2018-10-06 ENCOUNTER — Telehealth: Payer: Self-pay | Admitting: Cardiovascular Disease

## 2018-10-06 NOTE — Telephone Encounter (Signed)
Blood pressures look okay.  If she feels better off the hydrochlorothiazide then I would discontinue it

## 2018-10-06 NOTE — Telephone Encounter (Signed)
Spoke with pt who state she is no longer having chest pain but has been feeling very dizzy since starting HCTZ. She report she has always felt a little dizzy but it has since increased. Pt report she stopped medication last Friday and symptoms has gotten a little better. Pt also provided the last 3 days BP which is listed below.  5/28-126/73 HR 83 5/27-135/78 HR 90          148/79 HR 86 5/26-109/70 HR 86  Will route to MD for recommendations.

## 2018-10-06 NOTE — Telephone Encounter (Signed)
Follow up  ° ° °Patient is returning call.  °

## 2018-10-06 NOTE — Telephone Encounter (Signed)
LM2CB 

## 2018-10-06 NOTE — Telephone Encounter (Signed)
New Message           Patient is calling in today to discuss "Hydrochlorothiazide" 12.5 making her extremely dizzy. Patient states the pain in the front of her heart has stopped. Patient no longer wants to take this medication because of the dizziness unless other wise stated by Dr. [name]. Pls call patient to advise.

## 2018-10-18 NOTE — Telephone Encounter (Signed)
Advised patient, verbalized understanding  

## 2018-10-18 NOTE — Telephone Encounter (Signed)
Left message to call back  

## 2018-10-28 ENCOUNTER — Telehealth: Payer: Self-pay | Admitting: Cardiovascular Disease

## 2018-10-28 NOTE — Telephone Encounter (Signed)
Patient taking Carvedilol once in the morning and once in the evening Plavix daily  Rosuvastatin 40 mg daily Losartan 100 mg daily.  Blood pressure has been low in the morning, starting yesterday and this morning.   17th am- 128/87 82 HR Afternoon- 77HR same bp  19th @ 2 am- (felt bad, dizzy) 90/63 86 HR Afternoon @ 2:45- 104/70 73 HR 92/63 right before she called [place] at 3:20.  Patient has been dizzy all day, and has been unable to do anything today.

## 2018-10-28 NOTE — Telephone Encounter (Signed)
New Message   Pt c/o BP issue:  1. What are your last 5 BP readings? 90/63(2am reading, 86 pulse), 104/70 (830am reading, 73 pulse), 6/17 128/87 (am reading with a 82 pulse) 2. Are you having any other symptoms (ex. Dizziness, headache, blurred vision, passed out)? Dizziness and blurred vision.  3. What is your medication issue?

## 2018-10-30 NOTE — Telephone Encounter (Signed)
Cut Lorartan back from 100 to 50 mg daily

## 2018-10-31 NOTE — Telephone Encounter (Signed)
Left detailed message per DPR with the following recommendation from Dr. [name]: "Cut Losartan back from 100 to 50 mg daily"

## 2018-12-01 ENCOUNTER — Telehealth: Payer: Self-pay | Admitting: Hematology

## 2018-12-01 NOTE — Telephone Encounter (Signed)
Received voicemail on new patient line from [place] at [facility] re former [place] patient.  Per Doris patient missed appointment, is feeling weak and would like to reschedule.   Patient no showed for March appointment with Dr. Burr Medico. Called patient re rescheduling and was not able to reach her. Left message asking patient to call this office back at direct new patient line to reschedule. This message routed to [facility] and Dr. Burr Medico

## 2018-12-06 NOTE — Telephone Encounter (Signed)
Hello,  Can this patient be rescheduled with Dr. Burr Medico?  Will someone reach out to patient again and give her instructions on what she will need to do at this point?  Thanks,  [employer]

## 2018-12-19 DIAGNOSIS — D509 Iron deficiency anemia, unspecified: Secondary | ICD-10-CM | POA: Diagnosis not present

## 2018-12-27 ENCOUNTER — Telehealth: Payer: Self-pay | Admitting: *Deleted

## 2018-12-27 ENCOUNTER — Telehealth: Payer: Self-pay | Admitting: Nurse Practitioner

## 2018-12-27 NOTE — Telephone Encounter (Signed)
Received a new hem referral from Dr. Inda Merlin for iron deficiency. Caitlyn [name] was originally scheduled for an appt in March but cxl due to Covid. She has been cld and scheduled to see Caitlyn [name] on 8/31 at 145pm. Caitlyn [name] is aware to arrive 15 minutes early.

## 2018-12-27 NOTE — Telephone Encounter (Signed)
I received a call from Ms. Poteat who was calling to speak to a navigator.  Patient reports that she was a heme patient of Dr. Beryle Beams and that she feels she may need to have an iron infusion.  Patient reports that she was scheduled to see Dr. Burr Medico but she did not have the appointment due to [place].  I asked if she had called to reschedule and she reported that her PCP has called but not received a returned call.  After reviewing recent progress notes, I sent an in basket message to the new patient scheduler and requested that she contact and schedule patient. I called patient back to update her and left a voice mail to return my call.

## 2019-01-09 ENCOUNTER — Encounter: Payer: Self-pay | Admitting: Nurse Practitioner

## 2019-01-09 ENCOUNTER — Ambulatory Visit: Payer: PPO

## 2019-01-09 ENCOUNTER — Inpatient Hospital Stay: Payer: PPO

## 2019-01-09 ENCOUNTER — Telehealth: Payer: Self-pay | Admitting: Nurse Practitioner

## 2019-01-09 ENCOUNTER — Other Ambulatory Visit: Payer: Self-pay

## 2019-01-09 ENCOUNTER — Inpatient Hospital Stay: Payer: PPO | Attending: Nurse Practitioner | Admitting: Nurse Practitioner

## 2019-01-09 VITALS — BP 166/81 | HR 74 | Temp 98.5°F | Resp 16 | Ht 64.0 in | Wt 148.9 lb

## 2019-01-09 DIAGNOSIS — J45909 Unspecified asthma, uncomplicated: Secondary | ICD-10-CM | POA: Diagnosis not present

## 2019-01-09 DIAGNOSIS — M1990 Unspecified osteoarthritis, unspecified site: Secondary | ICD-10-CM | POA: Diagnosis not present

## 2019-01-09 DIAGNOSIS — I2510 Atherosclerotic heart disease of native coronary artery without angina pectoris: Secondary | ICD-10-CM | POA: Diagnosis not present

## 2019-01-09 DIAGNOSIS — I252 Old myocardial infarction: Secondary | ICD-10-CM | POA: Diagnosis not present

## 2019-01-09 DIAGNOSIS — Z79899 Other long term (current) drug therapy: Secondary | ICD-10-CM | POA: Diagnosis not present

## 2019-01-09 DIAGNOSIS — D509 Iron deficiency anemia, unspecified: Secondary | ICD-10-CM | POA: Insufficient documentation

## 2019-01-09 DIAGNOSIS — I10 Essential (primary) hypertension: Secondary | ICD-10-CM | POA: Insufficient documentation

## 2019-01-09 DIAGNOSIS — Z955 Presence of coronary angioplasty implant and graft: Secondary | ICD-10-CM | POA: Insufficient documentation

## 2019-01-09 DIAGNOSIS — Z7982 Long term (current) use of aspirin: Secondary | ICD-10-CM | POA: Diagnosis not present

## 2019-01-09 DIAGNOSIS — K219 Gastro-esophageal reflux disease without esophagitis: Secondary | ICD-10-CM | POA: Insufficient documentation

## 2019-01-09 DIAGNOSIS — D508 Other iron deficiency anemias: Secondary | ICD-10-CM

## 2019-01-09 LAB — CBC WITH DIFFERENTIAL (CANCER CENTER ONLY)
Abs Immature Granulocytes: 0.01 10*3/uL (ref 0.00–0.07)
Basophils Absolute: 0 10*3/uL (ref 0.0–0.1)
Basophils Relative: 1 %
Eosinophils Absolute: 0.1 10*3/uL (ref 0.0–0.5)
Eosinophils Relative: 1 %
HCT: 35.9 % — ABNORMAL LOW (ref 36.0–46.0)
Hemoglobin: 11.3 g/dL — ABNORMAL LOW (ref 12.0–15.0)
Immature Granulocytes: 0 %
Lymphocytes Relative: 28 %
Lymphs Abs: 1.6 10*3/uL (ref 0.7–4.0)
MCH: 27.6 pg (ref 26.0–34.0)
MCHC: 31.5 g/dL (ref 30.0–36.0)
MCV: 87.6 fL (ref 80.0–100.0)
Monocytes Absolute: 0.4 10*3/uL (ref 0.1–1.0)
Monocytes Relative: 8 %
Neutro Abs: 3.6 10*3/uL (ref 1.7–7.7)
Neutrophils Relative %: 62 %
Platelet Count: 239 10*3/uL (ref 150–400)
RBC: 4.1 MIL/uL (ref 3.87–5.11)
RDW: 13.8 % (ref 11.5–15.5)
WBC Count: 5.7 10*3/uL (ref 4.0–10.5)
nRBC: 0 % (ref 0.0–0.2)

## 2019-01-09 NOTE — Progress Notes (Addendum)
[place]  [phone] [phone]  Clinic New consult Note   Patient Care Team: Josetta Huddle, MD as PCP - General (Internal Medicine) Lorretta Harp, MD as PCP - Cardiology (Cardiology) Annia Belt, MD as Consulting Physician (Oncology) Lorretta Harp, MD as Consulting Physician (Cardiology)  Date of Service: 01/09/2019   CHIEF COMPLAINTS/PURPOSE OF CONSULTATION:  Iron deficiency anemia, referred by Dr. Josetta Huddle  HISTORY OF PRESENTING ILLNESS:  Caitlyn [name] 73 y.o. female is here because of iron deficiency anemia and intolerance to oral iron. Her abnormal CBC dates back to 2013 per available records, CBC on 10/19/2011 shows Hgb 8.8, HCT 30.2, MCV 74.0. FOBT that day was negative. She was previously seen by hematologist Dr. Murriel Hopper on 03/08/2013. Iron work up revealed ferritin 7, iron 36, TIBC 479, UIBC 443, and 8% transferrin saturation. She reportedly is intolerant to many oral iron formulations, causing constipation and diarrhea.  She has received IV Feraheme periodically every 6-12 months from 2014-2019, last dose 11/24/2017. Ferritin responds briefly and anemia normalizes periodically. Hgb at peak of 14.8 in 08/08/2015. Her last colonoscopy on 12/18/2011 per Dr. Laurence Spates showed a few medium-mouthed diverticula in the sigmoid and descending colon otherwise unremarkable. EGD on 12/18/2011 showed hiatal hernia but otherwise unremarkable. Biopsy of the small bowel was obtained to r/o celiac disease, path showed benign mucosa. A repeat EGD on 12/14/2014 was essentially unchanged, no evidence of bleeding or other abnormality. She experienced positive FOBT and acute upper GI bleed with Hgb 9.7 in 06/2016 while on aspirin and plavix due to MI and coronary artery stenting. Hemoglobin remained stable and she had no further GI bleeding in the hospital and this was managed outpatient. Aspirin was reduced to 81 mg and she continued plavix per  patient. Repeat endoscopies not done at that time. She continued iron infusions per Dr. Beryle Beams. Last IV Feraheme dose on 11/24/2017 for ferritin of 10, Hgb at that time was 10.6. She received a blood transfusion remotely. She eats normal diet but rarely eats meat. Her appetites is low at baseline. Denies previous abdominal or weight loss surgery other than "tummy tuck". No recent GI bleeding. Takes NSAIDs sparingly for right hip bursitis. She has no prior history of cancer. Not UTD on mammogram and does not plan to repeat colonoscopy.   PMH is significant for NSTEMI in 2018 and 2020 with coronary artery stenting, right femur fracture, HTN, HL, GERD, pancreatic insufficiency on pancrelipase, asthma, allergies, vertigo. She is widowed, lives alone. Has one healthy adult child. She is retired, does Patent attorney work. Independent of all ADLs. She walks her dog for exercise but limits overexertion due to heart history. Drinks alcohol socially. Denies tobacco or drug use. Denies family history of anemia or blood disorder. She has maternal cousin who had metastatic breast cancer and 2 maternal uncles who had lung cancer, they worked in [employer].   Today she presents to clinic alone. She is very fatigued, worsening since 10/2018, but remains able to complete ADLs. Has intermittent chest pains that are worse when she's fatigued. She feels cold always, no chills. Has dyspnea on exertion which is stable for her. Had a fever last week, up to 102 that resolved spontaneously. Her appetite is low at baseline, has never been interested in food. She weighed 181 lbs earlier this year before her latest MI, but not all weight loss has been intentional. Denies change in bowel habits. Denies blood in stool. She has ongoing urinary frequency. She  had an episode of vertigo last week.   MEDICAL HISTORY:  Past Medical History:  Diagnosis Date  . Anemia   . Arthritis   . Bleeding diathesis ([place]) 02/22/2013  . Closed right hip  fracture ([place]) 05/28/2016  . Coronary artery disease   . E coli enteritis   . GERD (gastroesophageal reflux disease)   . H/O hiatal hernia   . Head injury   . Hypertension   . Iron deficiency anemia, unspecified 02/22/2013  . Mass of right kidney 08/22/2013   2.5 cm solid lesion seen on 5/14 CT  . Myocardial infarction ([place])   . Radicular pain of thoracic region 08/22/2013   Left T-8 started after pushing heavy chest 1/15; intermittent; no focal neuro deficit    SURGICAL HISTORY: Past Surgical History:  Procedure Laterality Date  . ABDOMINAL HYSTERECTOMY    . BREAST BIOPSY    . BREAST LUMPECTOMY Left   . BREAST REDUCTION SURGERY  1981  . BREAST SURGERY  1981   rt-nodes taken out-non cancer  . BREAST SURGERY  1981   rt lumpectomy  . CARDIAC CATHETERIZATION N/A 06/01/2016   Procedure: LEFT HEART CATH;  Surgeon: Lorretta Harp, MD;  Location: [place] [facility];  Service: Cardiovascular;  Laterality: N/A;  . CARDIAC CATHETERIZATION N/A 06/01/2016   Procedure: Coronary Stent Intervention;  Surgeon: Lorretta Harp, MD;  Location: [place];  Service: Cardiovascular;  Laterality: N/A;  . COLONOSCOPY    . CORONARY STENT INTERVENTION N/A 08/14/2017   Procedure: CORONARY STENT INTERVENTION;  Surgeon: Burnell Blanks, MD;  Location: [place] [facility];  Service: Cardiovascular;  Laterality: N/A;  . FACIAL FRACTURE SURGERY  2000  . FEMUR IM NAIL Right 06/03/2016   Procedure: INTRAMEDULLARY (IM) NAIL FEMORAL;  Surgeon: Rod Can, MD;  Location: [facility];  Service: Orthopedics;  Laterality: Right;  . LEFT HEART CATH AND CORONARY ANGIOGRAPHY N/A 08/14/2017   Procedure: LEFT HEART CATH AND CORONARY ANGIOGRAPHY;  Surgeon: Burnell Blanks, MD;  Location: [place] [facility];  Service: Cardiovascular;  Laterality: N/A;  . LEFT HEART CATH AND CORONARY ANGIOGRAPHY N/A 11/29/2017   Procedure: LEFT HEART CATH AND CORONARY ANGIOGRAPHY;  Surgeon: Belva Crome, MD;  Location: [place];  Service: Cardiovascular;  Laterality: N/A;  . TONSILLECTOMY    . TRIGGER FINGER RELEASE Right 11/28/2013   Procedure: RELEASE TRIGGER FINGER/A-1 PULLEY RIGHT LONG FINGER;  Surgeon: Cammie Sickle, MD;  Location: [facility];  Service: Orthopedics;  Laterality: Right;  . URETHRA SURGERY     age 52    SOCIAL HISTORY: Social History   Socioeconomic History  . Marital status: Widowed    Spouse name: Not on file  . Number of children: 1  . Years of education: Not on file  . Highest education level: Not on file  Occupational History  . Occupation: retired  Scientific laboratory technician  . Financial resource strain: Not on file  . Food insecurity    Worry: Not on file    Inability: Not on file  . Transportation needs    Medical: Not on file    Non-medical: Not on file  Tobacco Use  . Smoking status: Never Smoker  . Smokeless tobacco: Never Used  Substance and Sexual Activity  . Alcohol use: Yes    Alcohol/week: 0.0 standard drinks    Comment: occasional   . Drug use: No  . Sexual activity: Not on file  Lifestyle  . Physical  activity    Days per week: Not on file    Minutes per session: Not on file  . Stress: Not on file  Relationships  . Social Herbalist on phone: Not on file    Gets together: Not on file    Attends religious service: Not on file    Active member of club or organization: Not on file    Attends meetings of clubs or organizations: Not on file    Relationship status: Not on file  . Intimate partner violence    Fear of current or ex partner: Not on file    Emotionally abused: Not on file    Physically abused: Not on file    Forced sexual activity: Not on file  Other Topics Concern  . Not on file  Social History Narrative  . Not on file    FAMILY HISTORY: Family History  Problem Relation Age of Onset  . Heart attack Father   . Heart disease Mother   . Alzheimer's disease Mother   . Heart disease Brother   . Colon  cancer Maternal Grandfather   . Stomach cancer Neg Hx   . Rectal cancer Neg Hx   . Esophageal cancer Neg Hx   . Liver cancer Neg Hx     ALLERGIES:  is allergic to cortisone; demerol [meperidine]; monosodium glutamate; morphine and related; prednisone; shellfish allergy; sulfa antibiotics; buprenorphine hcl; readi-cat [barium sulfate]; hctz [hydrochlorothiazide]; xanax [alprazolam]; and benzoic acid.  MEDICATIONS:  Current Outpatient Medications  Medication Sig Dispense Refill  . ALBUTEROL SULFATE HFA IN Inhale 2 puffs into the lungs every 6 (six) hours as needed (shortness of breath/wheezing).    Marland Kitchen aspirin 81 MG EC tablet Take 1 tablet (81 mg total) by mouth daily. 30 tablet 11  . Calcium Carb-Cholecalciferol (CALCIUM-VITAMIN D) 600-400 MG-UNIT TABS Take by mouth.    . carvedilol (COREG) 25 MG tablet Take 1 tablet (25 mg total) by mouth 2 (two) times daily with a meal. (Patient taking differently: Take 25 mg by mouth daily. 50mg  qd) 60 tablet 6  . Cholecalciferol (VITAMIN D PO) Take 1 tablet by mouth daily.    . clopidogrel (PLAVIX) 75 MG tablet Take 1 tablet (75 mg total) by mouth daily with breakfast. 30 tablet 2  . fenofibrate 160 MG tablet TAKE 1 TABLET BY MOUTH DAILY 30 tablet 6  . ibuprofen (ADVIL) 400 MG tablet Take 1 tablet (400 mg total) by mouth every 8 (eight) hours as needed for moderate pain. Take 1 tablet tid for the first 3 days and then prn for severe pain 15 tablet 0  . levothyroxine (SYNTHROID, LEVOTHROID) 88 MCG tablet Take 88 mcg by mouth daily before breakfast. Must be NAME BRAND "Synthroid"    . lipase/protease/amylase (CREON) 36000 UNITS CPEP capsule Take 36,000-72,000 Units by mouth as needed. Take one capsule (36,000 units) by mouth every morning, may increase to two capsules (72,000 units) as needed for diarrhea    . losartan (COZAAR) 100 MG tablet TAKE 1 TABLET BY MOUTH DAILY 90 tablet 0  . methocarbamol (ROBAXIN) 500 MG tablet Take 1 tablet (500 mg total) by mouth  every 6 (six) hours as needed for muscle spasms. 30 tablet 3  . nitroGLYCERIN (NITROSTAT) 0.4 MG SL tablet Place 0.4 mg under the tongue every 5 (five) minutes as needed for chest pain.    . rosuvastatin (CRESTOR) 40 MG tablet Take 1 tablet (40 mg total) by mouth daily at 6 PM. 90 tablet  3  . citalopram (CELEXA) 10 MG tablet Take 10 mg by mouth daily.    Marland Kitchen venlafaxine XR (EFFEXOR-XR) 75 MG 24 hr capsule Take 75 mg by mouth daily.     No current facility-administered medications for this visit.    Facility-Administered Medications Ordered in Other Visits  Medication Dose Route Frequency Provider Last Rate Last Dose  . fentaNYL (SUBLIMAZE) injection    Anesthesia Intra-op Marinda Elk A, CRNA   50 mcg at 05/31/16 0721  . lactated ringers infusion    Continuous PRN Marinda Elk A, CRNA 50 mL/hr at 06/03/16 2300      REVIEW OF SYSTEMS:   Constitutional: Denies chills or abnormal night sweats (+) fatigue (+) weight loss (+) fever  Eyes: Denies blurriness of vision, double vision or watery eyes Ears, nose, mouth, throat, and face: Denies mucositis or sore throat Respiratory: Denies cough or wheezes (+) dyspnea on exertion  Cardiovascular: Denies palpitation or lower extremity swelling (+) periodic chest pain  Gastrointestinal:  Denies nausea, constipation, diarrhea, melena, hematemesis, heartburn or change in bowel habits Skin: Denies abnormal skin rashes Lymphatics: Denies new lymphadenopathy or easy bruising Neurological:Denies numbness, tingling or new weaknesses MSK: (+) right hip bursitis  Behavioral/Psych: Mood is stable, no new changes  All other systems were reviewed with the patient and are negative.  PHYSICAL EXAMINATION: ECOG PERFORMANCE STATUS: 1 - Symptomatic but completely ambulatory  Vitals:   01/09/19 1342  BP: (!) 166/81  Pulse: 74  Resp: 16  Temp: 98.5 F (36.9 C)  SpO2: 97%   Filed Weights   01/09/19 1342  Weight: 148 lb 14.4 oz (67.5 kg)     GENERAL:alert, no distress and comfortable SKIN: no obvious rash  EYES: sclera clear LYMPH:  no palpable cervical or supraclavicular lymphadenopathy LUNGS: clear to auscultation with normal breathing effort HEART: regular rate & rhythm, no lower extremity edema ABDOMEN: abdomen soft, non-tender and normal bowel sounds Musculoskeletal:no cyanosis of digits  PSYCH: alert & oriented x 3 with fluent speech NEURO: no focal motor/sensory deficits  LABORATORY DATA:  I have reviewed the data as listed CBC Latest Ref Rng & Units 01/09/2019 11/30/2017 11/29/2017  WBC 4.0 - 10.5 K/uL 5.7 5.5 5.7  Hemoglobin 12.0 - 15.0 g/dL 11.3(L) 10.6(L) 11.8(L)  Hematocrit 36.0 - 46.0 % 35.9(L) 35.1(L) 38.6  Platelets 150 - 400 K/uL 239 204 206     RADIOGRAPHIC STUDIES: I have personally reviewed the radiological images as listed and agreed with the findings in the report. No results found.  ASSESSMENT & PLAN: 73 yo female with h/o CAD, GI bleed and chronic IDA  1. Iron deficiency anemia, due to intolerance to oral iron, malabsorption, and possibly microscopic bleeding from diverticula in the sigmoid and descending colon  -we reviewed her Epic and outside medical records in detail with the patient. She has chronic intermittent anemia and iron deficiency requiring IV Feraheme support since 2014, last dose in 11/2017. She was previously managed by Dr. Beryle Beams with infusions q6-12 months. She is intolerant to oral iron supplementation.  -GI work up revealed few diverticula in the sigmoid and descending colon, which we discussed can contribute to microscopic GI bleeding. She will monitor her stool.  -Due to her cardiac history, will keep goal ferritin >100 with IV Feraheme PRN -labs today show Hgb 11.3, ferritin 14, serum iron 39, TIBC 366, 11% transferrin saturation.  -We recommend she proceed with IV Feraheme weekly x2. Orders placed today and pending insurance approval.  -we plan to monitor her  CBC and iron  studies closely due to her high bleeding risk, lab monthly x3 and f/u in 3 months   2. CAD, h/o MI and stenting, HTN, HL -followed by Dr. [name] -She had GI bleed while on aspirin and plavix for CAD, due to her significant CAD she will remain on dual anti-platelet therapy per Dr. [name].  -We discussed this regimen can increase her risk of bleeding  -will monitor CBC and iron closely due to her high bleeding risk  3. Health maintenance -she reports she is not up to date on screening mammogram but decided she is not going to pursue testing. I explained the rationale for annual screening is to detect early breast cancers that could likely be cured with surgery alone and may not require extensive treatment. She understands but declined  -she also does not want to have any more colonoscopies  PLAN: -Medical record and labs reviewed -IV Feraheme weekly x3 -Labs monthly x3 -F/u in 3 months   Orders Placed This Encounter  Procedures  . CBC with Differential (Cancer Center Only)    Standing Status:   Standing    Number of Occurrences:   20    Standing Expiration Date:   01/09/2020  . Iron and TIBC    Standing Status:   Standing    Number of Occurrences:   20    Standing Expiration Date:   01/09/2020  . Ferritin    Standing Status:   Standing    Number of Occurrences:   20    Standing Expiration Date:   01/09/2020    All questions were answered. The patient knows to call the clinic with any problems, questions or concerns.    Alla Feeling, NP 01/10/19 10:32 AM   Addendum  I have seen the patient, examined her. I agree with the assessment and and plan and have edited the notes.   Caitlyn [name] has history of GI bleeding and iron deficient anemia.  She has diverticulosis, last colonoscopy in 2018 was negative for active bleeding.  She has required intermittent IV iron since 2016. Last infusion in 11/2017 when she saw Dr. Beryle Beams last time. Will repeat lab today with CBC and iron study,  and set up iv feraheme if needed. Given her CAD, I will give iv iron if ferritin<100, or low iron with anemia, to avoid anemia. We also discussed that dual antiplatelet therapy with aspirin and Plavix will increase her risk of GI bleeding, given her extensive coronary artery disease, I do not think we need to stop it, as long as we monitor her CBC and iron level closely, and she has not recurrent overt GI bleeding.  Truitt Merle  01/09/2019

## 2019-01-10 ENCOUNTER — Telehealth: Payer: Self-pay | Admitting: *Deleted

## 2019-01-10 ENCOUNTER — Telehealth: Payer: Self-pay | Admitting: Nurse Practitioner

## 2019-01-10 ENCOUNTER — Encounter: Payer: Self-pay | Admitting: Nurse Practitioner

## 2019-01-10 LAB — IRON AND TIBC
Iron: 39 ug/dL — ABNORMAL LOW (ref 41–142)
Saturation Ratios: 11 % — ABNORMAL LOW (ref 21–57)
TIBC: 366 ug/dL (ref 236–444)
UIBC: 327 ug/dL (ref 120–384)

## 2019-01-10 LAB — FERRITIN: Ferritin: 14 ng/mL (ref 11–307)

## 2019-01-10 NOTE — Telephone Encounter (Signed)
-----   Message from Alla Feeling, NP sent at 01/10/2019 11:22 AM EDT ----- Please let her know Hgb is 11.3. Her iron and ferritin are low, we will give Feraheme weekly x2. She is already scheduled on 9/3. She will get a call from scheduler about her next appointment. Our goal is to keep Ferratin >100. Due to her bleeding risk on aspirin and plavix, will check labs monthly for now, given Feraheme as needed, and f/u in 3 months.  Thanks, Regan Rakers

## 2019-01-10 NOTE — Telephone Encounter (Signed)
Per Regan Rakers NP, called pt about results for and schedule appt for Feraheme. Vmail left for pt to return call

## 2019-01-10 NOTE — Progress Notes (Signed)
Awaiting call back from pt about schedule and lab results

## 2019-01-10 NOTE — Telephone Encounter (Signed)
Scheduled appt per 8/31 sch message- unable to reach pt . Left message with appt date and time

## 2019-01-10 NOTE — Progress Notes (Signed)
Awaiting callback from pt.  

## 2019-01-10 NOTE — Telephone Encounter (Signed)
Opened by accident

## 2019-01-12 ENCOUNTER — Telehealth: Payer: Self-pay | Admitting: Nurse Practitioner

## 2019-01-12 ENCOUNTER — Telehealth: Payer: Self-pay | Admitting: *Deleted

## 2019-01-12 ENCOUNTER — Inpatient Hospital Stay: Payer: PPO

## 2019-01-12 NOTE — Telephone Encounter (Signed)
I called Caitlyn [name] to discuss iron therapy and future appointments. No answer. I left message asking her to return my call.  [name], NP 01/12/2019

## 2019-01-12 NOTE — Telephone Encounter (Signed)
"  Liane Comber with [facility] [phone]).  We need iron orders for tomorrow to administer."

## 2019-01-19 ENCOUNTER — Inpatient Hospital Stay: Payer: PPO

## 2019-01-20 ENCOUNTER — Other Ambulatory Visit (HOSPITAL_COMMUNITY): Payer: Self-pay | Admitting: *Deleted

## 2019-01-23 ENCOUNTER — Ambulatory Visit (HOSPITAL_COMMUNITY)
Admission: RE | Admit: 2019-01-23 | Discharge: 2019-01-23 | Disposition: A | Discharge status: Home / Self Care | Payer: PPO | Source: Ambulatory Visit | Attending: Internal Medicine | Admitting: Internal Medicine

## 2019-01-23 ENCOUNTER — Other Ambulatory Visit: Payer: Self-pay

## 2019-01-23 DIAGNOSIS — D509 Iron deficiency anemia, unspecified: Secondary | ICD-10-CM | POA: Insufficient documentation

## 2019-01-23 MED ORDER — SODIUM CHLORIDE 0.9 % IV SOLN
510.0000 mg | Freq: Once | INTRAVENOUS | Status: AC
  Administered 2019-01-23: 510 mg via INTRAVENOUS
  Filled 2019-01-23: qty 17

## 2019-01-31 DIAGNOSIS — R42 Dizziness and giddiness: Secondary | ICD-10-CM | POA: Diagnosis not present

## 2019-01-31 DIAGNOSIS — D649 Anemia, unspecified: Secondary | ICD-10-CM | POA: Diagnosis not present

## 2019-01-31 DIAGNOSIS — Z7902 Long term (current) use of antithrombotics/antiplatelets: Secondary | ICD-10-CM | POA: Diagnosis not present

## 2019-01-31 DIAGNOSIS — I2510 Atherosclerotic heart disease of native coronary artery without angina pectoris: Secondary | ICD-10-CM | POA: Diagnosis not present

## 2019-01-31 DIAGNOSIS — E611 Iron deficiency: Secondary | ICD-10-CM | POA: Diagnosis not present

## 2019-02-01 ENCOUNTER — Telehealth: Payer: Self-pay | Admitting: Hematology & Oncology

## 2019-02-01 NOTE — Telephone Encounter (Signed)
lmom to inform patient of new patient appt date/time/location 10/29 at 1030 am

## 2019-02-07 DIAGNOSIS — J4520 Mild intermittent asthma, uncomplicated: Secondary | ICD-10-CM | POA: Diagnosis not present

## 2019-02-07 DIAGNOSIS — D509 Iron deficiency anemia, unspecified: Secondary | ICD-10-CM | POA: Diagnosis not present

## 2019-02-07 DIAGNOSIS — E039 Hypothyroidism, unspecified: Secondary | ICD-10-CM | POA: Diagnosis not present

## 2019-02-07 DIAGNOSIS — Z23 Encounter for immunization: Secondary | ICD-10-CM | POA: Diagnosis not present

## 2019-02-07 DIAGNOSIS — E785 Hyperlipidemia, unspecified: Secondary | ICD-10-CM | POA: Diagnosis not present

## 2019-02-07 DIAGNOSIS — I10 Essential (primary) hypertension: Secondary | ICD-10-CM | POA: Diagnosis not present

## 2019-02-07 DIAGNOSIS — I2510 Atherosclerotic heart disease of native coronary artery without angina pectoris: Secondary | ICD-10-CM | POA: Diagnosis not present

## 2019-02-07 DIAGNOSIS — D649 Anemia, unspecified: Secondary | ICD-10-CM | POA: Diagnosis not present

## 2019-02-07 DIAGNOSIS — F329 Major depressive disorder, single episode, unspecified: Secondary | ICD-10-CM | POA: Diagnosis not present

## 2019-02-07 DIAGNOSIS — I214 Non-ST elevation (NSTEMI) myocardial infarction: Secondary | ICD-10-CM | POA: Diagnosis not present

## 2019-02-07 DIAGNOSIS — F322 Major depressive disorder, single episode, severe without psychotic features: Secondary | ICD-10-CM | POA: Diagnosis not present

## 2019-02-16 ENCOUNTER — Inpatient Hospital Stay: Payer: PPO | Attending: Nurse Practitioner

## 2019-03-07 DIAGNOSIS — F329 Major depressive disorder, single episode, unspecified: Secondary | ICD-10-CM | POA: Diagnosis not present

## 2019-03-07 DIAGNOSIS — E039 Hypothyroidism, unspecified: Secondary | ICD-10-CM | POA: Diagnosis not present

## 2019-03-07 DIAGNOSIS — I10 Essential (primary) hypertension: Secondary | ICD-10-CM | POA: Diagnosis not present

## 2019-03-07 DIAGNOSIS — J4520 Mild intermittent asthma, uncomplicated: Secondary | ICD-10-CM | POA: Diagnosis not present

## 2019-03-07 DIAGNOSIS — F322 Major depressive disorder, single episode, severe without psychotic features: Secondary | ICD-10-CM | POA: Diagnosis not present

## 2019-03-07 DIAGNOSIS — D649 Anemia, unspecified: Secondary | ICD-10-CM | POA: Diagnosis not present

## 2019-03-07 DIAGNOSIS — I214 Non-ST elevation (NSTEMI) myocardial infarction: Secondary | ICD-10-CM | POA: Diagnosis not present

## 2019-03-07 DIAGNOSIS — E785 Hyperlipidemia, unspecified: Secondary | ICD-10-CM | POA: Diagnosis not present

## 2019-03-07 DIAGNOSIS — I2510 Atherosclerotic heart disease of native coronary artery without angina pectoris: Secondary | ICD-10-CM | POA: Diagnosis not present

## 2019-03-07 DIAGNOSIS — D509 Iron deficiency anemia, unspecified: Secondary | ICD-10-CM | POA: Diagnosis not present

## 2019-03-09 ENCOUNTER — Other Ambulatory Visit: Payer: PPO

## 2019-03-09 ENCOUNTER — Ambulatory Visit: Payer: PPO | Admitting: Hematology & Oncology

## 2019-03-13 DIAGNOSIS — T50Z95A Adverse effect of other vaccines and biological substances, initial encounter: Secondary | ICD-10-CM | POA: Diagnosis not present

## 2019-03-22 ENCOUNTER — Inpatient Hospital Stay: Payer: PPO | Attending: Nurse Practitioner

## 2019-03-22 ENCOUNTER — Other Ambulatory Visit: Payer: Self-pay

## 2019-03-22 ENCOUNTER — Other Ambulatory Visit: Payer: Self-pay | Admitting: Hematology & Oncology

## 2019-03-22 ENCOUNTER — Inpatient Hospital Stay (HOSPITAL_BASED_OUTPATIENT_CLINIC_OR_DEPARTMENT_OTHER): Payer: PPO | Admitting: Hematology & Oncology

## 2019-03-22 ENCOUNTER — Other Ambulatory Visit: Payer: Self-pay | Admitting: *Deleted

## 2019-03-22 ENCOUNTER — Encounter: Payer: Self-pay | Admitting: Hematology & Oncology

## 2019-03-22 VITALS — BP 152/88 | HR 63 | Temp 97.0°F | Resp 20 | Wt 145.0 lb

## 2019-03-22 DIAGNOSIS — D500 Iron deficiency anemia secondary to blood loss (chronic): Secondary | ICD-10-CM

## 2019-03-22 DIAGNOSIS — Z79899 Other long term (current) drug therapy: Secondary | ICD-10-CM | POA: Insufficient documentation

## 2019-03-22 DIAGNOSIS — Z1231 Encounter for screening mammogram for malignant neoplasm of breast: Secondary | ICD-10-CM

## 2019-03-22 DIAGNOSIS — N6320 Unspecified lump in the left breast, unspecified quadrant: Secondary | ICD-10-CM

## 2019-03-22 DIAGNOSIS — K909 Intestinal malabsorption, unspecified: Secondary | ICD-10-CM | POA: Diagnosis not present

## 2019-03-22 DIAGNOSIS — Z7982 Long term (current) use of aspirin: Secondary | ICD-10-CM | POA: Diagnosis not present

## 2019-03-22 DIAGNOSIS — D509 Iron deficiency anemia, unspecified: Secondary | ICD-10-CM | POA: Diagnosis not present

## 2019-03-22 DIAGNOSIS — D508 Other iron deficiency anemias: Secondary | ICD-10-CM

## 2019-03-22 LAB — SAVE SMEAR(SSMR), FOR PROVIDER SLIDE REVIEW

## 2019-03-22 LAB — CBC WITH DIFFERENTIAL (CANCER CENTER ONLY)
Abs Immature Granulocytes: 0.01 10*3/uL (ref 0.00–0.07)
Basophils Absolute: 0 10*3/uL (ref 0.0–0.1)
Basophils Relative: 1 %
Eosinophils Absolute: 0.1 10*3/uL (ref 0.0–0.5)
Eosinophils Relative: 1 %
HCT: 40.8 % (ref 36.0–46.0)
Hemoglobin: 13.3 g/dL (ref 12.0–15.0)
Immature Granulocytes: 0 %
Lymphocytes Relative: 28 %
Lymphs Abs: 1.3 10*3/uL (ref 0.7–4.0)
MCH: 28.7 pg (ref 26.0–34.0)
MCHC: 32.6 g/dL (ref 30.0–36.0)
MCV: 87.9 fL (ref 80.0–100.0)
Monocytes Absolute: 0.4 10*3/uL (ref 0.1–1.0)
Monocytes Relative: 8 %
Neutro Abs: 2.9 10*3/uL (ref 1.7–7.7)
Neutrophils Relative %: 62 %
Platelet Count: 202 10*3/uL (ref 150–400)
RBC: 4.64 MIL/uL (ref 3.87–5.11)
RDW: 15.1 % (ref 11.5–15.5)
WBC Count: 4.7 10*3/uL (ref 4.0–10.5)
nRBC: 0 % (ref 0.0–0.2)

## 2019-03-22 LAB — CMP (CANCER CENTER ONLY)
ALT: 8 U/L (ref 0–44)
AST: 17 U/L (ref 15–41)
Albumin: 4.7 g/dL (ref 3.5–5.0)
Alkaline Phosphatase: 44 U/L (ref 38–126)
Anion gap: 7 (ref 5–15)
BUN: 22 mg/dL (ref 8–23)
CO2: 29 mmol/L (ref 22–32)
Calcium: 9.8 mg/dL (ref 8.9–10.3)
Chloride: 106 mmol/L (ref 98–111)
Creatinine: 0.92 mg/dL (ref 0.44–1.00)
GFR, Est AFR Am: >60 mL/min (ref >60)
GFR, Estimated: >60 mL/min (ref >60)
Glucose, Bld: 150 mg/dL — ABNORMAL HIGH (ref 70–99)
Potassium: 4.1 mmol/L (ref 3.5–5.1)
Sodium: 142 mmol/L (ref 135–145)
Total Bilirubin: 0.7 mg/dL (ref 0.3–1.2)
Total Protein: 7.2 g/dL (ref 6.5–8.1)

## 2019-03-22 LAB — RETICULOCYTES
Immature Retic Fract: 8 % (ref 2.3–15.9)
RBC.: 4.68 MIL/uL (ref 3.87–5.11)
Retic Count, Absolute: 61.8 10*3/uL (ref 19.0–186.0)
Retic Ct Pct: 1.3 % (ref 0.4–3.1)

## 2019-03-23 ENCOUNTER — Other Ambulatory Visit: Payer: PPO

## 2019-03-23 LAB — FERRITIN: Ferritin: 69 ng/mL (ref 11–307)

## 2019-03-23 LAB — HEMOGLOBINOPATHY EVALUATION
Hgb A2 Quant: 2 % (ref 1.8–3.2)
Hgb A: 98 % (ref 96.4–98.8)
Hgb C: 0 %
Hgb F Quant: 0 % (ref 0.0–2.0)
Hgb S Quant: 0 %
Hgb Variant: 0 %

## 2019-03-23 LAB — IRON AND TIBC
Iron: 89 ug/dL (ref 41–142)
Saturation Ratios: 19 % — ABNORMAL LOW (ref 21–57)
TIBC: 481 ug/dL — ABNORMAL HIGH (ref 236–444)
UIBC: 392 ug/dL — ABNORMAL HIGH (ref 120–384)

## 2019-03-23 NOTE — Progress Notes (Signed)
Hematology and Oncology Follow Up Visit  Caitlyn [name] [id number] 07/27/1945 73 y.o. 03/23/2019   Principle Diagnosis:   Iron deficiency anemia secondary to iron malabsorption  Current Therapy:    IV iron as indicated     Interim History:  Caitlyn [name] is in for her first office visit.  She had been followed by the great Dr. Beryle Beams.  He last saw her healthy couple years ago.  I think she has been trying to be managed by her family doctor.  She is now being referred out to the [facility] that we can help with her iron issues.  She has had no problems with bleeding.  There is been no problems with her appetite.  She is not a vegetarian.  She has had no weight loss or weight gain.  There is no obvious change in bowel or bladder habits.  Is been a couple years since she has had a mammogram.  She has a nodule in the upper portion of the left breast.  She has noticed this for a few months.  She is not to ice.  There is no neuropathy in the hands or feet.  She has had no rashes.  Her last iron studies were done back in August 2020.  Her ferritin was 114 with an iron saturation of 11%.  She thinks she got IV iron at that time.  Overall, I would say her performance status is ECOG 0.  Medications:  Current Outpatient Medications:  .  ALBUTEROL SULFATE HFA IN, Inhale 2 puffs into the lungs every 6 (six) hours as needed (shortness of breath/wheezing)., Disp: , Rfl:  .  aspirin 81 MG EC tablet, Take 1 tablet (81 mg total) by mouth daily., Disp: 30 tablet, Rfl: 11 .  Calcium Carb-Cholecalciferol (CALCIUM-VITAMIN D) 600-400 MG-UNIT TABS, Take by mouth., Disp: , Rfl:  .  carvedilol (COREG) 25 MG tablet, Take 1 tablet (25 mg total) by mouth 2 (two) times daily with a meal. (Patient taking differently: Take 25 mg by mouth 2 (two) times daily. ), Disp: 60 tablet, Rfl: 6 .  Cholecalciferol (VITAMIN D PO), Take 1 tablet by mouth daily., Disp: , Rfl:  .  clopidogrel  (PLAVIX) 75 MG tablet, Take 1 tablet (75 mg total) by mouth daily with breakfast., Disp: 30 tablet, Rfl: 2 .  fenofibrate 160 MG tablet, TAKE 1 TABLET BY MOUTH DAILY, Disp: 30 tablet, Rfl: 6 .  ibuprofen (ADVIL) 400 MG tablet, Take 1 tablet (400 mg total) by mouth every 8 (eight) hours as needed for moderate pain. Take 1 tablet tid for the first 3 days and then prn for severe pain, Disp: 15 tablet, Rfl: 0 .  levothyroxine (SYNTHROID, LEVOTHROID) 88 MCG tablet, Take 88 mcg by mouth daily before breakfast. Must be NAME BRAND "Synthroid", Disp: , Rfl:  .  lipase/protease/amylase (CREON) 36000 UNITS CPEP capsule, Take [id number] Units by mouth as needed. Take one capsule (36,000 units) by mouth every morning, may increase to two capsules (72,000 units) as needed for diarrhea, Disp: , Rfl:  .  losartan (COZAAR) 100 MG tablet, TAKE 1 TABLET BY MOUTH DAILY, Disp: 90 tablet, Rfl: 0 .  rosuvastatin (CRESTOR) 40 MG tablet, Take 1 tablet (40 mg total) by mouth daily at 6 PM., Disp: 90 tablet, Rfl: 3 .  citalopram (CELEXA) 10 MG tablet, Take 10 mg by mouth daily., Disp: , Rfl:  .  methocarbamol (ROBAXIN) 500 MG tablet, Take 1 tablet (500 mg total) by mouth every 6 (  six) hours as needed for muscle spasms. (Patient not taking: Reported on 03/22/2019), Disp: 30 tablet, Rfl: 3 .  nitroGLYCERIN (NITROSTAT) 0.4 MG SL tablet, Place 0.4 mg under the tongue every 5 (five) minutes as needed for chest pain., Disp: , Rfl:  .  venlafaxine XR (EFFEXOR-XR) 75 MG 24 hr capsule, Take 75 mg by mouth daily., Disp: , Rfl:  No current facility-administered medications for this visit.   Facility-Administered Medications Ordered in Other Visits:  .  fentaNYL (SUBLIMAZE) injection, , , Anesthesia Intra-op, Marinda Elk A, CRNA, 50 mcg at 05/31/16 0721 .  lactated ringers infusion, , , Continuous PRN, Marinda Elk A, CRNA, Last Rate: 50 mL/hr at 06/03/16 2300  Allergies:  Allergies  Allergen Reactions  . Cortisone Anaphylaxis     Some forms of cortisone  . Demerol [Meperidine] Anaphylaxis  . Monosodium Glutamate Anaphylaxis  . Morphine And Related Anaphylaxis, Nausea And Vomiting and Other (See Comments)  . Prednisone Anaphylaxis    confusion  . Shellfish Allergy Anaphylaxis  . Sulfa Antibiotics Anaphylaxis  . Buprenorphine Hcl Nausea And Vomiting and Hypertension    SHAKING  . Readi-Cat [Barium Sulfate] Diarrhea    Patient does not want to drink Readi-Cat because it gives her diarrhea  . Hctz [Hydrochlorothiazide]     Dizziness   . Xanax [Alprazolam] Other (See Comments)    "makes me crazy"  . Benzoic Acid Nausea Only    Past Medical History, Surgical history, Social history, and Family History were reviewed and updated.  Review of Systems: Review of Systems  Constitutional: Negative.   HENT:  Negative.   Eyes: Negative.   Respiratory: Negative.   Cardiovascular: Negative.   Gastrointestinal: Negative.   Endocrine: Negative.   Genitourinary: Negative.    Musculoskeletal: Negative.   Skin: Negative.   Neurological: Negative.   Hematological: Negative.   Psychiatric/Behavioral: Negative.     Physical Exam:  weight is 145 lb (65.8 kg). Her oral temperature is 97 F (36.1 C) (abnormal). Her blood pressure is 152/88 (abnormal) and her pulse is 63. Her respiration is 20 and oxygen saturation is 100%.   Wt Readings from Last 3 Encounters:  03/22/19 145 lb (65.8 kg)  01/09/19 148 lb 14.4 oz (67.5 kg)  09/06/18 149 lb (67.6 kg)    Physical Exam Vitals signs reviewed.  Constitutional:      Comments: Breast exam shows right breast with no masses, edema or erythema.  There is no right axillary adenopathy.  Left breast does show a mass at the 12 o'clock position in the upper portion of the left breast.  This is slightly tender.  He probably measures about 2 cm.  There is no nipple discharge.  There is no erythema.  There is no left axillary adenopathy.  HENT:     Head: Normocephalic and  atraumatic.  Eyes:     Pupils: Pupils are equal, round, and reactive to light.  Neck:     Musculoskeletal: Normal range of motion.  Cardiovascular:     Rate and Rhythm: Normal rate and regular rhythm.     Heart sounds: Normal heart sounds.  Pulmonary:     Effort: Pulmonary effort is normal.     Breath sounds: Normal breath sounds.  Abdominal:     General: Bowel sounds are normal.     Palpations: Abdomen is soft.  Musculoskeletal: Normal range of motion.        General: No tenderness or deformity.  Lymphadenopathy:     Cervical: No cervical adenopathy.  Skin:    General: Skin is warm and dry.     Findings: No erythema or rash.  Neurological:     Mental Status: She is alert and oriented to person, place, and time.  Psychiatric:        Behavior: Behavior normal.        Thought Content: Thought content normal.        Judgment: Judgment normal.      Lab Results  Component Value Date   WBC 4.7 03/22/2019   HGB 13.3 03/22/2019   HCT 40.8 03/22/2019   MCV 87.9 03/22/2019   PLT 202 03/22/2019     Chemistry      Component Value Date/Time   NA 142 03/22/2019 1016   NA 140 03/11/2017 1116   K 4.1 03/22/2019 1016   CL 106 03/22/2019 1016   CO2 29 03/22/2019 1016   BUN 22 03/22/2019 1016   BUN 15 03/11/2017 1116   CREATININE 0.92 03/22/2019 1016   CREATININE 0.65 09/18/2014 1042      Component Value Date/Time   CALCIUM 9.8 03/22/2019 1016   ALKPHOS 44 03/22/2019 1016   AST 17 03/22/2019 1016   ALT 8 03/22/2019 1016   BILITOT 0.7 03/22/2019 1016       Impression and Plan: Caitlyn [name] is a 73 year old white female.  She has history of iron deficiency anemia secondary to iron malabsorption.  I would have to think that her iron levels should be okay.  Her hemoglobin is certainly much better.  She has an rising MCV.  We will see what her iron levels show.  I am not sure what this mass is with the left breast.  It seems like it might be somewhat superior to the actual  breast tissue.  If the mammogram does not show [place] anything, we may have to consider a CT scan.  It was fun talking to her.  She has a very strong faith.  I did give her a prayer blanket.  I know it will be impossible to fill the shoes of Dr. Beryle Beams.  He is a true icon in [place].  We will be aggressive with our follow-up.  We will have her come back to see [place] in 2 months.  I suspect given her iron malabsorption, that she likely will need intermittent iron therapy.  I spent about 45 minutes with her today.  I answered all of her questions.  I reviewed her lab work.  We had a good time reminiscing about Dr. Beryle Beams.   Volanda Napoleon, MD 11/12/20207:07 AM

## 2019-03-28 ENCOUNTER — Other Ambulatory Visit: Payer: Self-pay | Admitting: Hematology

## 2019-04-03 ENCOUNTER — Ambulatory Visit
Admission: RE | Admit: 2019-04-03 | Discharge: 2019-04-03 | Disposition: A | Discharge status: Home / Self Care | Payer: PPO | Source: Ambulatory Visit | Attending: Hematology & Oncology | Admitting: Hematology & Oncology

## 2019-04-03 ENCOUNTER — Other Ambulatory Visit: Payer: Self-pay

## 2019-04-03 DIAGNOSIS — R928 Other abnormal and inconclusive findings on diagnostic imaging of breast: Secondary | ICD-10-CM | POA: Diagnosis not present

## 2019-04-03 DIAGNOSIS — N6320 Unspecified lump in the left breast, unspecified quadrant: Secondary | ICD-10-CM

## 2019-04-03 DIAGNOSIS — N6489 Other specified disorders of breast: Secondary | ICD-10-CM | POA: Diagnosis not present

## 2019-04-03 DIAGNOSIS — N6321 Unspecified lump in the left breast, upper outer quadrant: Secondary | ICD-10-CM | POA: Diagnosis not present

## 2019-04-10 DIAGNOSIS — I2510 Atherosclerotic heart disease of native coronary artery without angina pectoris: Secondary | ICD-10-CM | POA: Diagnosis not present

## 2019-04-10 DIAGNOSIS — D649 Anemia, unspecified: Secondary | ICD-10-CM | POA: Diagnosis not present

## 2019-04-10 DIAGNOSIS — F322 Major depressive disorder, single episode, severe without psychotic features: Secondary | ICD-10-CM | POA: Diagnosis not present

## 2019-04-10 DIAGNOSIS — I214 Non-ST elevation (NSTEMI) myocardial infarction: Secondary | ICD-10-CM | POA: Diagnosis not present

## 2019-04-10 DIAGNOSIS — J4520 Mild intermittent asthma, uncomplicated: Secondary | ICD-10-CM | POA: Diagnosis not present

## 2019-04-10 DIAGNOSIS — E039 Hypothyroidism, unspecified: Secondary | ICD-10-CM | POA: Diagnosis not present

## 2019-04-10 DIAGNOSIS — F329 Major depressive disorder, single episode, unspecified: Secondary | ICD-10-CM | POA: Diagnosis not present

## 2019-04-10 DIAGNOSIS — D509 Iron deficiency anemia, unspecified: Secondary | ICD-10-CM | POA: Diagnosis not present

## 2019-04-10 DIAGNOSIS — I10 Essential (primary) hypertension: Secondary | ICD-10-CM | POA: Diagnosis not present

## 2019-04-10 DIAGNOSIS — E785 Hyperlipidemia, unspecified: Secondary | ICD-10-CM | POA: Diagnosis not present

## 2019-04-12 DIAGNOSIS — R05 Cough: Secondary | ICD-10-CM | POA: Diagnosis not present

## 2019-04-20 ENCOUNTER — Other Ambulatory Visit: Payer: PPO

## 2019-04-20 ENCOUNTER — Ambulatory Visit: Payer: PPO | Admitting: Nurse Practitioner

## 2019-05-08 DIAGNOSIS — F329 Major depressive disorder, single episode, unspecified: Secondary | ICD-10-CM | POA: Diagnosis not present

## 2019-05-08 DIAGNOSIS — I214 Non-ST elevation (NSTEMI) myocardial infarction: Secondary | ICD-10-CM | POA: Diagnosis not present

## 2019-05-08 DIAGNOSIS — I10 Essential (primary) hypertension: Secondary | ICD-10-CM | POA: Diagnosis not present

## 2019-05-08 DIAGNOSIS — E039 Hypothyroidism, unspecified: Secondary | ICD-10-CM | POA: Diagnosis not present

## 2019-05-08 DIAGNOSIS — D649 Anemia, unspecified: Secondary | ICD-10-CM | POA: Diagnosis not present

## 2019-05-08 DIAGNOSIS — F322 Major depressive disorder, single episode, severe without psychotic features: Secondary | ICD-10-CM | POA: Diagnosis not present

## 2019-05-08 DIAGNOSIS — I2510 Atherosclerotic heart disease of native coronary artery without angina pectoris: Secondary | ICD-10-CM | POA: Diagnosis not present

## 2019-05-08 DIAGNOSIS — E785 Hyperlipidemia, unspecified: Secondary | ICD-10-CM | POA: Diagnosis not present

## 2019-05-08 DIAGNOSIS — J4520 Mild intermittent asthma, uncomplicated: Secondary | ICD-10-CM | POA: Diagnosis not present

## 2019-05-08 DIAGNOSIS — D509 Iron deficiency anemia, unspecified: Secondary | ICD-10-CM | POA: Diagnosis not present

## 2019-05-24 ENCOUNTER — Inpatient Hospital Stay: Payer: PPO | Admitting: Hematology & Oncology

## 2019-05-24 ENCOUNTER — Inpatient Hospital Stay: Payer: PPO | Attending: Nurse Practitioner

## 2019-06-05 DIAGNOSIS — J4520 Mild intermittent asthma, uncomplicated: Secondary | ICD-10-CM | POA: Diagnosis not present

## 2019-06-05 DIAGNOSIS — E785 Hyperlipidemia, unspecified: Secondary | ICD-10-CM | POA: Diagnosis not present

## 2019-06-05 DIAGNOSIS — I214 Non-ST elevation (NSTEMI) myocardial infarction: Secondary | ICD-10-CM | POA: Diagnosis not present

## 2019-06-05 DIAGNOSIS — D509 Iron deficiency anemia, unspecified: Secondary | ICD-10-CM | POA: Diagnosis not present

## 2019-06-05 DIAGNOSIS — R69 Illness, unspecified: Secondary | ICD-10-CM | POA: Diagnosis not present

## 2019-06-05 DIAGNOSIS — I10 Essential (primary) hypertension: Secondary | ICD-10-CM | POA: Diagnosis not present

## 2019-06-05 DIAGNOSIS — D649 Anemia, unspecified: Secondary | ICD-10-CM | POA: Diagnosis not present

## 2019-06-05 DIAGNOSIS — E039 Hypothyroidism, unspecified: Secondary | ICD-10-CM | POA: Diagnosis not present

## 2019-06-05 DIAGNOSIS — I2510 Atherosclerotic heart disease of native coronary artery without angina pectoris: Secondary | ICD-10-CM | POA: Diagnosis not present

## 2019-06-15 DIAGNOSIS — R413 Other amnesia: Secondary | ICD-10-CM | POA: Diagnosis not present

## 2019-07-13 ENCOUNTER — Telehealth: Payer: Self-pay | Admitting: *Deleted

## 2019-07-13 NOTE — Telephone Encounter (Signed)
Patient called stating that she is very tired and knows that her iron is low.  Appt made on Monday for labwork and to see Caitlyn Peace NP. Patient appreciates appointment

## 2019-07-17 ENCOUNTER — Encounter: Payer: Self-pay | Admitting: Family

## 2019-07-17 ENCOUNTER — Inpatient Hospital Stay: Payer: Medicare HMO | Attending: Nurse Practitioner

## 2019-07-17 ENCOUNTER — Inpatient Hospital Stay (HOSPITAL_BASED_OUTPATIENT_CLINIC_OR_DEPARTMENT_OTHER): Payer: Medicare HMO | Admitting: Family

## 2019-07-17 ENCOUNTER — Other Ambulatory Visit: Payer: Self-pay

## 2019-07-17 ENCOUNTER — Telehealth: Payer: Self-pay | Admitting: *Deleted

## 2019-07-17 ENCOUNTER — Telehealth: Payer: Self-pay | Admitting: Family

## 2019-07-17 ENCOUNTER — Other Ambulatory Visit: Payer: Self-pay | Admitting: Family

## 2019-07-17 VITALS — BP 162/85 | HR 69 | Temp 97.1°F | Resp 18 | Ht 64.0 in | Wt 144.0 lb

## 2019-07-17 DIAGNOSIS — D509 Iron deficiency anemia, unspecified: Secondary | ICD-10-CM | POA: Diagnosis present

## 2019-07-17 DIAGNOSIS — K909 Intestinal malabsorption, unspecified: Secondary | ICD-10-CM | POA: Insufficient documentation

## 2019-07-17 DIAGNOSIS — Z79899 Other long term (current) drug therapy: Secondary | ICD-10-CM | POA: Diagnosis not present

## 2019-07-17 DIAGNOSIS — D508 Other iron deficiency anemias: Secondary | ICD-10-CM

## 2019-07-17 DIAGNOSIS — Z7982 Long term (current) use of aspirin: Secondary | ICD-10-CM | POA: Diagnosis not present

## 2019-07-17 DIAGNOSIS — I252 Old myocardial infarction: Secondary | ICD-10-CM | POA: Diagnosis not present

## 2019-07-17 DIAGNOSIS — D500 Iron deficiency anemia secondary to blood loss (chronic): Secondary | ICD-10-CM

## 2019-07-17 LAB — IRON AND TIBC
Iron: 77 ug/dL (ref 41–142)
Saturation Ratios: 18 % — ABNORMAL LOW (ref 21–57)
TIBC: 430 ug/dL (ref 236–444)
UIBC: 354 ug/dL (ref 120–384)

## 2019-07-17 LAB — CMP (CANCER CENTER ONLY)
ALT: 11 U/L (ref 0–44)
AST: 16 U/L (ref 15–41)
Albumin: 4.6 g/dL (ref 3.5–5.0)
Alkaline Phosphatase: 57 U/L (ref 38–126)
Anion gap: 8 (ref 5–15)
BUN: 23 mg/dL (ref 8–23)
CO2: 27 mmol/L (ref 22–32)
Calcium: 9.7 mg/dL (ref 8.9–10.3)
Chloride: 105 mmol/L (ref 98–111)
Creatinine: 0.7 mg/dL (ref 0.44–1.00)
GFR, Est AFR Am: >60 mL/min (ref >60)
GFR, Estimated: >60 mL/min (ref >60)
Glucose, Bld: 128 mg/dL — ABNORMAL HIGH (ref 70–99)
Potassium: 3.6 mmol/L (ref 3.5–5.1)
Sodium: 140 mmol/L (ref 135–145)
Total Bilirubin: 0.7 mg/dL (ref 0.3–1.2)
Total Protein: 7.3 g/dL (ref 6.5–8.1)

## 2019-07-17 LAB — CBC WITH DIFFERENTIAL (CANCER CENTER ONLY)
Abs Immature Granulocytes: 0.01 10*3/uL (ref 0.00–0.07)
Basophils Absolute: 0 10*3/uL (ref 0.0–0.1)
Basophils Relative: 1 %
Eosinophils Absolute: 0.1 10*3/uL (ref 0.0–0.5)
Eosinophils Relative: 2 %
HCT: 40.9 % (ref 36.0–46.0)
Hemoglobin: 13.3 g/dL (ref 12.0–15.0)
Immature Granulocytes: 0 %
Lymphocytes Relative: 26 %
Lymphs Abs: 1.2 10*3/uL (ref 0.7–4.0)
MCH: 29.6 pg (ref 26.0–34.0)
MCHC: 32.5 g/dL (ref 30.0–36.0)
MCV: 90.9 fL (ref 80.0–100.0)
Monocytes Absolute: 0.3 10*3/uL (ref 0.1–1.0)
Monocytes Relative: 7 %
Neutro Abs: 3.1 10*3/uL (ref 1.7–7.7)
Neutrophils Relative %: 64 %
Platelet Count: 186 10*3/uL (ref 150–400)
RBC: 4.5 MIL/uL (ref 3.87–5.11)
RDW: 13.6 % (ref 11.5–15.5)
WBC Count: 4.8 10*3/uL (ref 4.0–10.5)
nRBC: 0 % (ref 0.0–0.2)

## 2019-07-17 LAB — FERRITIN: Ferritin: 22 ng/mL (ref 11–307)

## 2019-07-17 NOTE — Telephone Encounter (Signed)
Call received from patient with a "complaint".  Pt states that she was told from a phone call on 07/13/19 that she would be receiving an iron infusion today.  Informed pt that we do not give iron infusions, until iron results are complete.  Pt argumentative and states that "she knows that iron results come back in minutes."  Informed pt that iron results do not come back within minutes and that when they are available, they would be reviewed by S. Paulding NP and she would be notified of results.

## 2019-07-17 NOTE — Telephone Encounter (Signed)
I called and spoke with patient regarding added appointments for iron infusion per 3/8 sch msg. Patient was very confused stating that Judson Roch had called her to schedule her for  Iron infusion appt for today 3/8 after her visit with her.  I explained to her that we would never schedule iron infusion appt on the saem day she see's a provider  Due to no lab results .  Patient was very confused about  [place].  Once I gave her the dates for the next appt.  She asked again before I hung up now what date her appt was on.  I gave her the date/time again and then another 2-3 times before I hung up the phone. I advised her that I would give her an updated schedule when she came in the office tomorrow morning  Still very confused at the end of the conversation

## 2019-07-17 NOTE — Progress Notes (Signed)
Hematology and Oncology Follow Up Visit  Caitlyn [name] 1945-10-18 74 y.o. 07/17/2019   Principle Diagnosis:  Iron deficiency anemia secondary to malabsorption  Current Therapy:   IV iron as indicated    Interim History:  Caitlyn [name] is here today for follow-up. She is symptomatic with fatigue and craving ice.  She has SOB at times associated with asthma.  No fever, chills, n/v, cough, rash, dizziness, palpitations, abdominal pain or changes in bowel or bladder habits.  She has angina and history of 4 heart attacks. She takes nitro as needed which is effective.  No swelling or tenderness in her extremities.  She has numbness and tingling in her extremities due to bone spurs.  She has occasional nose bleeds that she is able to stop in under 10 minutes. She is on aspirin and Plavix.  She has had no other episode of bleeding. No bruising or petechiae.  Hgb is stable at 13.3, MCV 90, platelets 186.  No falls or syncopal episodes to report.  He has maintained a good appetite and is staying well hydrated. Her weight is stable.   ECOG Performance Status: 1 - Symptomatic but completely ambulatory  Medications:  Allergies as of 07/17/2019      Reactions   Cortisone Anaphylaxis   Some forms of cortisone   Demerol [meperidine] Anaphylaxis   Monosodium Glutamate Anaphylaxis   Morphine And Related Anaphylaxis, Nausea And Vomiting, Other (See Comments)   Prednisone Anaphylaxis   confusion   Shellfish Allergy Anaphylaxis   Sulfa Antibiotics Anaphylaxis   Buprenorphine Hcl Nausea And Vomiting, Hypertension   SHAKING   Hctz [hydrochlorothiazide] Other (See Comments)   Dizziness    Readi-cat [barium Sulfate] Diarrhea   Patient does not want to drink Readi-Cat because it gives her diarrhea   Xanax [alprazolam] Other (See Comments)   "makes me crazy"   Benzoic Acid Nausea Only      Medication List       Accurate as of July 17, 2019  9:44 AM. If you have any questions, ask your  nurse or doctor.        ALBUTEROL SULFATE HFA IN Inhale 2 puffs into the lungs every 6 (six) hours as needed (shortness of breath/wheezing).   aspirin 81 MG EC tablet Take 1 tablet (81 mg total) by mouth daily.   Calcium-Vitamin D 600-400 MG-UNIT Tabs Take by mouth.   carvedilol 25 MG tablet Commonly known as: COREG Take 1 tablet (25 mg total) by mouth 2 (two) times daily with a meal. What changed: when to take this   citalopram 10 MG tablet Commonly known as: CELEXA Take 10 mg by mouth daily.   clopidogrel 75 MG tablet Commonly known as: PLAVIX Take 1 tablet (75 mg total) by mouth daily with breakfast.   Creon 36000 UNITS Cpep capsule Generic drug: lipase/protease/amylase Take [id number] Units by mouth as needed. Take one capsule (36,000 units) by mouth every morning, may increase to two capsules (72,000 units) as needed for diarrhea   fenofibrate 160 MG tablet TAKE 1 TABLET BY MOUTH DAILY   ibuprofen 400 MG tablet Commonly known as: ADVIL Take 1 tablet (400 mg total) by mouth every 8 (eight) hours as needed for moderate pain. Take 1 tablet tid for the first 3 days and then prn for severe pain   levothyroxine 88 MCG tablet Commonly known as: SYNTHROID Take 88 mcg by mouth daily before breakfast. Must be NAME BRAND "Synthroid"   losartan 100 MG tablet Commonly known as: COZAAR  TAKE 1 TABLET BY MOUTH DAILY   methocarbamol 500 MG tablet Commonly known as: ROBAXIN Take 1 tablet (500 mg total) by mouth every 6 (six) hours as needed for muscle spasms.   nitroGLYCERIN 0.4 MG SL tablet Commonly known as: NITROSTAT Place 0.4 mg under the tongue every 5 (five) minutes as needed for chest pain.   rosuvastatin 40 MG tablet Commonly known as: CRESTOR Take 1 tablet (40 mg total) by mouth daily at 6 PM.   venlafaxine XR 75 MG 24 hr capsule Commonly known as: EFFEXOR-XR Take 75 mg by mouth daily.   VITAMIN D PO Take 1 tablet by mouth daily.       Allergies:    Allergies  Allergen Reactions  . Cortisone Anaphylaxis    Some forms of cortisone  . Demerol [Meperidine] Anaphylaxis  . Monosodium Glutamate Anaphylaxis  . Morphine And Related Anaphylaxis, Nausea And Vomiting and Other (See Comments)  . Prednisone Anaphylaxis    confusion  . Shellfish Allergy Anaphylaxis  . Sulfa Antibiotics Anaphylaxis  . Buprenorphine Hcl Nausea And Vomiting and Hypertension    SHAKING  . Hctz [Hydrochlorothiazide] Other (See Comments)    Dizziness   . Readi-Cat [Barium Sulfate] Diarrhea    Patient does not want to drink Readi-Cat because it gives her diarrhea  . Xanax [Alprazolam] Other (See Comments)    "makes me crazy"  . Benzoic Acid Nausea Only    Past Medical History, Surgical history, Social history, and Family History were reviewed and updated.  Review of Systems: All other 10 point review of systems is negative.   Physical Exam:  vitals were not taken for this visit.   Wt Readings from Last 3 Encounters:  03/22/19 145 lb (65.8 kg)  01/09/19 148 lb 14.4 oz (67.5 kg)  09/06/18 149 lb (67.6 kg)    Ocular: Sclerae unicteric, pupils equal, round and reactive to light Ear-nose-throat: Oropharynx clear, dentition fair Lymphatic: No cervical or supraclavicular adenopathy Lungs no rales or rhonchi, good excursion bilaterally Heart regular rate and rhythm, no murmur appreciated Abd soft, nontender, positive bowel sounds, no liver or spleen tip palpated on exam, no fluid wave  MSK no focal spinal tenderness, no joint edema Neuro: non-focal, well-oriented, appropriate affect Breasts: Deferred   Lab Results  Component Value Date   WBC 4.8 07/17/2019   HGB 13.3 07/17/2019   HCT 40.9 07/17/2019   MCV 90.9 07/17/2019   PLT 186 07/17/2019   Lab Results  Component Value Date   FERRITIN 69 03/22/2019   IRON 89 03/22/2019   TIBC 481 (H) 03/22/2019   UIBC 392 (H) 03/22/2019   IRONPCTSAT 19 (L) 03/22/2019   Lab Results  Component Value Date    RETICCTPCT 1.3 03/22/2019   RBC 4.50 07/17/2019   RETICCTABS 66.6 11/27/2014   No results found for: KPAFRELGTCHN, LAMBDASER, KAPLAMBRATIO No results found for: IGGSERUM, IGA, IGMSERUM No results found for: Odetta Pink, SPEI   Chemistry      Component Value Date/Time   NA 140 07/17/2019 0854   NA 140 03/11/2017 1116   K 3.6 07/17/2019 0854   CL 105 07/17/2019 0854   CO2 27 07/17/2019 0854   BUN 23 07/17/2019 0854   BUN 15 03/11/2017 1116   CREATININE 0.70 07/17/2019 0854   CREATININE 0.65 09/18/2014 1042      Component Value Date/Time   CALCIUM 9.7 07/17/2019 0854   ALKPHOS 57 07/17/2019 0854   AST 16 07/17/2019 0854  ALT 11 07/17/2019 0854   BILITOT 0.7 07/17/2019 0854       Impression and Plan: Caitlyn [name] is a very pleasant 74 yo female with history of iron deficiency anemia secondary to malabsorption.  We will see what her iron studies show and replace if needed.  We will plan to see her back in another 4 months.  She will contact our office with any questions or concerns. We can certainly see her sooner if needed.   Laverna Peace, NP 3/8/20219:44 AM

## 2019-07-18 ENCOUNTER — Inpatient Hospital Stay: Payer: Medicare HMO

## 2019-07-18 ENCOUNTER — Telehealth: Payer: Self-pay | Admitting: Family

## 2019-07-18 VITALS — BP 138/78 | HR 72 | Temp 96.9°F | Resp 18

## 2019-07-18 DIAGNOSIS — K909 Intestinal malabsorption, unspecified: Secondary | ICD-10-CM | POA: Diagnosis not present

## 2019-07-18 DIAGNOSIS — D509 Iron deficiency anemia, unspecified: Secondary | ICD-10-CM | POA: Diagnosis not present

## 2019-07-18 DIAGNOSIS — D508 Other iron deficiency anemias: Secondary | ICD-10-CM

## 2019-07-18 DIAGNOSIS — Z7982 Long term (current) use of aspirin: Secondary | ICD-10-CM | POA: Diagnosis not present

## 2019-07-18 DIAGNOSIS — Z79899 Other long term (current) drug therapy: Secondary | ICD-10-CM | POA: Diagnosis not present

## 2019-07-18 DIAGNOSIS — I252 Old myocardial infarction: Secondary | ICD-10-CM | POA: Diagnosis not present

## 2019-07-18 MED ORDER — SODIUM CHLORIDE 0.9 % IV SOLN
200.0000 mg | Freq: Once | INTRAVENOUS | Status: AC
  Administered 2019-07-18: 200 mg via INTRAVENOUS
  Filled 2019-07-18: qty 200

## 2019-07-18 MED ORDER — SODIUM CHLORIDE 0.9 % IV SOLN
Freq: Once | INTRAVENOUS | Status: AC
  Filled 2019-07-18: qty 250

## 2019-07-18 NOTE — Patient Instructions (Signed)

## 2019-07-18 NOTE — Telephone Encounter (Signed)
I was able to speak with patient today regarding her iron infusions and lab work. We had discussed this yesterday during her follow-up appointment but she was still unclear as to why she had to come in for appointment and lab and not just be able to come in and get IV iron. I explained that her last set of iron studies were in November and with onset of her symptoms we needed current lab work and evaluation in order to know if IV iron was needed. We also discussed how current lab results are required by insurance for coverage of infusions. She was still not happy with these answers but did verbalize understanding. I apologized for the inconvenience to her but did emphasized the necessity of the proper work up for her best benefit. I also updated our nurse manager Maggie, RN on the situation.

## 2019-07-21 ENCOUNTER — Other Ambulatory Visit: Payer: Self-pay

## 2019-07-21 ENCOUNTER — Inpatient Hospital Stay: Payer: Medicare HMO

## 2019-07-21 VITALS — BP 115/92 | HR 66 | Temp 97.5°F | Resp 18

## 2019-07-21 DIAGNOSIS — D509 Iron deficiency anemia, unspecified: Secondary | ICD-10-CM | POA: Diagnosis not present

## 2019-07-21 DIAGNOSIS — I252 Old myocardial infarction: Secondary | ICD-10-CM | POA: Diagnosis not present

## 2019-07-21 DIAGNOSIS — Z79899 Other long term (current) drug therapy: Secondary | ICD-10-CM | POA: Diagnosis not present

## 2019-07-21 DIAGNOSIS — Z7982 Long term (current) use of aspirin: Secondary | ICD-10-CM | POA: Diagnosis not present

## 2019-07-21 DIAGNOSIS — K909 Intestinal malabsorption, unspecified: Secondary | ICD-10-CM | POA: Diagnosis not present

## 2019-07-21 DIAGNOSIS — D508 Other iron deficiency anemias: Secondary | ICD-10-CM

## 2019-07-21 MED ORDER — SODIUM CHLORIDE 0.9 % IV SOLN
200.0000 mg | Freq: Once | INTRAVENOUS | Status: AC
  Administered 2019-07-21: 13:00:00 200 mg via INTRAVENOUS
  Filled 2019-07-21: qty 10

## 2019-07-21 MED ORDER — SODIUM CHLORIDE 0.9 % IV SOLN
Freq: Once | INTRAVENOUS | Status: AC
  Filled 2019-07-21: qty 250

## 2019-07-21 NOTE — Patient Instructions (Signed)

## 2019-07-26 ENCOUNTER — Telehealth: Payer: Self-pay | Admitting: *Deleted

## 2019-07-26 ENCOUNTER — Telehealth: Payer: Self-pay | Admitting: Hematology & Oncology

## 2019-07-26 NOTE — Telephone Encounter (Signed)
Message received from patient stating that she would like to receive Feraheme as she has had in the past and not Venofer, d/t "Venofer is not working, she is tired and extremely weak."  Call placed back to patient and patient notified that iron can not be changed at this time d/t insurance PA is for Venofer.  Pt states that she will contact her insurance company to attempt to get approval for Feraheme.  Instructed pt to let this office know of the outcome.

## 2019-07-26 NOTE — Telephone Encounter (Signed)
Patient called in advising that she was cancelling her appointment for 3/18 for Venofer because she said it was not helping her at all.  I tried explaining to her that her insurance would only cover Venofer she was very insistant that she wanted to get Feraheme.   I did explain to her that this would be an OUT OF POCKET expense that she would be responsible for at the time of service

## 2019-07-27 ENCOUNTER — Inpatient Hospital Stay: Payer: Medicare HMO

## 2019-07-31 ENCOUNTER — Ambulatory Visit: Payer: Medicare HMO | Admitting: Neurology

## 2019-08-03 DIAGNOSIS — G250 Essential tremor: Secondary | ICD-10-CM | POA: Diagnosis not present

## 2019-08-03 DIAGNOSIS — R413 Other amnesia: Secondary | ICD-10-CM | POA: Diagnosis not present

## 2019-08-03 DIAGNOSIS — J4520 Mild intermittent asthma, uncomplicated: Secondary | ICD-10-CM | POA: Diagnosis not present

## 2019-08-03 DIAGNOSIS — I2510 Atherosclerotic heart disease of native coronary artery without angina pectoris: Secondary | ICD-10-CM | POA: Diagnosis not present

## 2019-08-03 DIAGNOSIS — I10 Essential (primary) hypertension: Secondary | ICD-10-CM | POA: Diagnosis not present

## 2019-08-03 DIAGNOSIS — R69 Illness, unspecified: Secondary | ICD-10-CM | POA: Diagnosis not present

## 2019-08-03 DIAGNOSIS — E559 Vitamin D deficiency, unspecified: Secondary | ICD-10-CM | POA: Diagnosis not present

## 2019-08-03 DIAGNOSIS — R040 Epistaxis: Secondary | ICD-10-CM | POA: Diagnosis not present

## 2019-08-31 ENCOUNTER — Telehealth: Payer: Self-pay | Admitting: Cardiovascular Disease

## 2019-08-31 NOTE — Telephone Encounter (Signed)
Spoke with pt, she prefers a virtual visit with [employer]. Follow up scheduled

## 2019-08-31 NOTE — Telephone Encounter (Signed)
   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 She would like to speak with Dr. Kennon Holter nurse about it. She also wondering if her appt on 09/05/19 can be virtual appt instead.  Please advise

## 2019-08-31 NOTE — Telephone Encounter (Signed)
Pt called to report that she has her first COVID vaccine and her arm has been very sore since.. she had it 08/15/19. She denies edema, redness, heat coming from the site.. I advised her to use a heating pad and she should call Dr. Inda Merlin or the urgent care to have it looked at.. pt will go to the urgent care near her home.   Pt would like her 09/05/19 office visit with Dr. [name] to be virtual... her last OV 08/2018 was virtual she says she just does not feel like coming in.   Will forward to Dr .[name] to review.

## 2019-08-31 NOTE — Telephone Encounter (Signed)
It's going to be a while before I have a virtual office. Perhaps she could see one of our APPs vitually

## 2019-09-04 NOTE — Progress Notes (Signed)
Virtual Visit via Telephone Note   This visit type was conducted due to national recommendations for restrictions regarding the COVID-19 Pandemic (e.g. social distancing) in an effort to limit this patient's exposure and mitigate transmission in our community.  Due to her co-morbid illnesses, this patient is at least at moderate risk for complications without adequate follow up.  This format is felt to be most appropriate for this patient at this time.  The patient did not have access to video technology/had technical difficulties with video requiring transitioning to audio format only (telephone).  All issues noted in this document were discussed and addressed.  No physical exam could be performed with this format.  Please refer to the patient's chart for her  consent to telehealth for [facility].   Date:  09/05/2019   ID:  Caitlyn [name] Dec 24, 1945, MRN [id number]  Patient Location: Home Provider Location: Home  PCP:  Josetta Huddle, MD  Cardiologist:  Quay Burow, MD Electrophysiologist:  None   Evaluation Performed:  Follow-Up Visit  Chief Complaint:  Follow up for HTN and CAD  History of Present Illness:    Caitlyn [name] is a 74 y.o. female with cath on 06/01/2016 revealing 90% mid LAD lesion which was stented with a Synergy DES.  This was completed prior to a right ORIF for a fractured hip one week later.   On 08/14/2017 repeat cardiac cath was performed in the setting of chest pain and NSTEMI, with cath revealing 90% proximal LAD lesion and 70% lesion just proximal to the previous stent to the LAD.  Dr. Angelena Form placed mid LAD stent both of which were stented with synergy drug-eluting stents. Her EF was in the 35 to 40% range, significantly lower than it had been previously which was suspected was related to "stunned myocardium."  She was admitted to the hospital 11/28/2017 with unstable angina and underwent cardiac catheterization radially by Dr. Tamala Julian the following day  revealing patent LAD stents, a jailed small diffusely diseased first diagonal branch with otherwise noncritical CAD and normal LV function. A subsequent Myoview stress test performed 12/24/2017 was low risk and nonischemic ischemic. She refuses nitrates due to fear of headaches.   She called our office on 08/31/2019 after receiving her COVID vaccine complaining of pain at the injection site.  She requested a virtual visit with Dr. [name] but he did not have any availability. She was placed on my schedule today.   She comes today with multiple worries about the soreness in her left arm. It is constant and non-radiating. No associated chest pain. Some shortness of breath from pollen allergies.   She also had an episode of memory loss for about a month. She was taken off of rosuvastatin by Dr. Inda Merlin, her PCP and has felt better. This was added to her long list of allergies.  BP has been stable, some highs and lows, but for the most part steady.   The patient does not have symptoms concerning for COVID-19 infection (fever, chills, cough, or new shortness of breath).    Past Medical History:  Diagnosis Date  . Anemia   . Arthritis   . Bleeding diathesis ([place]) 02/22/2013  . Closed right hip fracture ([place]) 05/28/2016  . Coronary artery disease   . E coli enteritis   . GERD (gastroesophageal reflux disease)   . H/O hiatal hernia   . Head injury   . Hypertension   . Iron deficiency anemia, unspecified 02/22/2013  . Mass of right kidney 08/22/2013  2.5 cm solid lesion seen on 5/14 CT  . Myocardial infarction ([place])   . Radicular pain of thoracic region 08/22/2013   Left T-8 started after pushing heavy chest 1/15; intermittent; no focal neuro deficit   Past Surgical History:  Procedure Laterality Date  . ABDOMINAL HYSTERECTOMY    . BREAST BIOPSY    . BREAST LUMPECTOMY Left   . BREAST REDUCTION SURGERY  1981  . BREAST SURGERY  1981   rt-nodes taken out-non cancer  . BREAST SURGERY  1981   rt  lumpectomy  . CARDIAC CATHETERIZATION N/A 06/01/2016   Procedure: LEFT HEART CATH;  Surgeon: Lorretta Harp, MD;  Location: [place] [facility];  Service: Cardiovascular;  Laterality: N/A;  . CARDIAC CATHETERIZATION N/A 06/01/2016   Procedure: Coronary Stent Intervention;  Surgeon: Lorretta Harp, MD;  Location: [name];  Service: Cardiovascular;  Laterality: N/A;  . COLONOSCOPY    . CORONARY STENT INTERVENTION N/A 08/14/2017   Procedure: CORONARY STENT INTERVENTION;  Surgeon: Burnell Blanks, MD;  Location: [place] [facility];  Service: Cardiovascular;  Laterality: N/A;  . FACIAL FRACTURE SURGERY  2000  . FEMUR IM NAIL Right 06/03/2016   Procedure: INTRAMEDULLARY (IM) NAIL FEMORAL;  Surgeon: Rod Can, MD;  Location: [place];  Service: Orthopedics;  Laterality: Right;  . LEFT HEART CATH AND CORONARY ANGIOGRAPHY N/A 08/14/2017   Procedure: LEFT HEART CATH AND CORONARY ANGIOGRAPHY;  Surgeon: Burnell Blanks, MD;  Location: [place] [facility];  Service: Cardiovascular;  Laterality: N/A;  . LEFT HEART CATH AND CORONARY ANGIOGRAPHY N/A 11/29/2017   Procedure: LEFT HEART CATH AND CORONARY ANGIOGRAPHY;  Surgeon: Belva Crome, MD;  Location: [place] [facility];  Service: Cardiovascular;  Laterality: N/A;  . TONSILLECTOMY    . TRIGGER FINGER RELEASE Right 11/28/2013   Procedure: RELEASE TRIGGER FINGER/A-1 PULLEY RIGHT LONG FINGER;  Surgeon: Cammie Sickle, MD;  Location: [place];  Service: Orthopedics;  Laterality: Right;  . URETHRA SURGERY     age 44     Current Meds  Medication Sig  . ALBUTEROL SULFATE HFA IN Inhale 2 puffs into the lungs every 6 (six) hours as needed (shortness of breath/wheezing).  Marland Kitchen aspirin 81 MG EC tablet Take 1 tablet (81 mg total) by mouth daily.  . calcium carbonate (OS-CAL) 600 MG TABS tablet Take 600 mg by mouth daily with breakfast.  . carvedilol (COREG) 25 MG tablet Take 1 tablet (25 mg total) by mouth 2 (two) times  daily with a meal. (Patient taking differently: Take 25 mg by mouth 2 (two) times daily. )  . Cholecalciferol (VITAMIN D PO) Take 3,000 Units by mouth daily.   . clopidogrel (PLAVIX) 75 MG tablet Take 1 tablet (75 mg total) by mouth daily with breakfast.  . escitalopram (LEXAPRO) 10 MG tablet Take 10 mg by mouth daily.  . fenofibrate 160 MG tablet TAKE 1 TABLET BY MOUTH DAILY  . ibuprofen (ADVIL) 400 MG tablet Take 1 tablet (400 mg total) by mouth every 8 (eight) hours as needed for moderate pain. Take 1 tablet tid for the first 3 days and then prn for severe pain  . levothyroxine (SYNTHROID, LEVOTHROID) 88 MCG tablet Take 88 mcg by mouth daily before breakfast. Must be NAME BRAND "Synthroid"  . lipase/protease/amylase (CREON) 36000 UNITS CPEP capsule Take 36,000-72,000 Units by mouth as needed. Take one capsule (36,000 units) by mouth every morning, may increase to two capsules (72,000 units) as needed for  diarrhea  . losartan (COZAAR) 100 MG tablet TAKE 1 TABLET BY MOUTH DAILY  . methocarbamol (ROBAXIN) 500 MG tablet Take 1 tablet (500 mg total) by mouth every 6 (six) hours as needed for muscle spasms.  . nitroGLYCERIN (NITROSTAT) 0.4 MG SL tablet Place 0.4 mg under the tongue every 5 (five) minutes as needed for chest pain.  . [DISCONTINUED] citalopram (CELEXA) 10 MG tablet Take 10 mg by mouth daily.  . [DISCONTINUED] venlafaxine XR (EFFEXOR-XR) 75 MG 24 hr capsule Take 75 mg by mouth daily.     Allergies:   Cortisone, Crestor [rosuvastatin calcium], Demerol [meperidine], Monosodium glutamate, Morphine and related, Prednisone, Shellfish allergy, Sulfa antibiotics, Buprenorphine hcl, Hctz [hydrochlorothiazide], Readi-cat [barium sulfate], Xanax [alprazolam], and Benzoic acid   Social History   Tobacco Use  . Smoking status: Never Smoker  . Smokeless tobacco: Never Used  Substance Use Topics  . Alcohol use: Yes    Alcohol/week: 0.0 standard drinks    Comment: occasional   . Drug use: No      Family Hx: The patient's family history includes Alzheimer's disease in her mother; Colon cancer in her maternal grandfather; Heart attack in her father; Heart disease in her brother and mother. There is no history of Stomach cancer, Rectal cancer, Esophageal cancer, or Liver cancer.  ROS:   Please see the history of present illness.    All other systems reviewed and are negative.   Prior CV studies:   The following studies were reviewed today: Echocardiogram 10/11/2017 Left ventricle: The cavity size was normal. There was moderate  concentric hypertrophy. Systolic function was normal. The  estimated ejection fraction was in the range of 55% to 60%. Wall  motion was normal; there were no regional wall motion  abnormalities.  - Aortic valve: There was trivial regurgitation.  - Left atrium: The atrium was mildly dilated.  - Atrial septum: No defect or patent foramen ovale was identified.  Labs/Other Tests and Data Reviewed:    EKG:  Not completed due to virtual visit.   Recent Labs: 07/17/2019: ALT 11; BUN 23; Creatinine 0.70; Hemoglobin 13.3; Platelet Count 186; Potassium 3.6; Sodium 140   Recent Lipid Panel Lab Results  Component Value Date/Time   CHOL 149 11/29/2017 07:36 AM   TRIG 154 (H) 11/29/2017 07:36 AM   HDL 49 11/29/2017 07:36 AM   CHOLHDL 3.0 11/29/2017 07:36 AM   LDLCALC 69 11/29/2017 07:36 AM    Wt Readings from Last 3 Encounters:  09/05/19 142 lb (64.4 kg)  07/17/19 144 lb (65.3 kg)  03/22/19 145 lb (65.8 kg)     Objective:    Vital Signs:  BP 103/88   Pulse 71   Ht 5\' 4"  (1.626 m)   Wt 142 lb (64.4 kg)   BMI 24.37 kg/m    VITAL SIGNS:  reviewed GEN:  no acute distress RESPIRATORY:  normal respiratory effort, symmetric expansion NEURO:  alert and oriented x 3, no obvious focal deficit PSYCH:  normal affect  ASSESSMENT & PLAN:    1. CAD: History of DES to the LAD. Most recent cath in 08/2017 with additional stents to the osteal and mid  LAD.She is very anxious about her left arm soreness and its possible relation to CAD symptoms. As the pain is constant, I do not think this is cardiac related. She will continue current regimen for now.  2. Hyperlipidemia: She cannot tolerate statins which have caused memory loss by taking rosuvastatin. She is reluctant to try other statins due  the this side effect. I will have her seen in the Lipid clinic to be established with Dr. Debara Pickett for his recommendations.  With CAD and prior MI, she will need protection.  3. Hypertension: BP has been labile at home. She states that "its always that way." and has no symptoms of dizziness or near syncope with lower BP.  No changes in her regimen.  4. Left arm soreness: This started after having COVID vaccine. She is advised to see her PCP for evaluation.    COVID-19 Education: The signs and symptoms of COVID-19 were discussed with the patient and how to seek care for testing (follow up with PCP or arrange E-visit).  The importance of social distancing was discussed today.  Time:   Today, I have spent 30 minutes with the patient with telehealth technology discussing the above problems.     Medication Adjustments/Labs and Tests Ordered: Current medicines are reviewed at length with the patient today.  Concerns regarding medicines are outlined above.   Tests Ordered: No orders of the defined types were placed in this encounter.   Medication Changes: No orders of the defined types were placed in this encounter.   Disposition:  Follow up 6 months  Signed, Phill Myron. [place] Pugh, ANP, AACC  09/05/2019 10:26 AM    [facility]

## 2019-09-05 ENCOUNTER — Telehealth (INDEPENDENT_AMBULATORY_CARE_PROVIDER_SITE_OTHER): Payer: Medicare HMO | Admitting: Adult Health

## 2019-09-05 ENCOUNTER — Ambulatory Visit: Payer: Medicare HMO | Admitting: Cardiovascular Disease

## 2019-09-05 ENCOUNTER — Encounter: Payer: Self-pay | Admitting: Adult Health

## 2019-09-05 VITALS — BP 103/88 | HR 71 | Ht 64.0 in | Wt 142.0 lb

## 2019-09-05 DIAGNOSIS — Z9861 Coronary angioplasty status: Secondary | ICD-10-CM

## 2019-09-05 DIAGNOSIS — I10 Essential (primary) hypertension: Secondary | ICD-10-CM | POA: Diagnosis not present

## 2019-09-05 DIAGNOSIS — I2510 Atherosclerotic heart disease of native coronary artery without angina pectoris: Secondary | ICD-10-CM | POA: Diagnosis not present

## 2019-09-05 DIAGNOSIS — M79602 Pain in left arm: Secondary | ICD-10-CM

## 2019-09-05 DIAGNOSIS — E785 Hyperlipidemia, unspecified: Secondary | ICD-10-CM

## 2019-09-05 NOTE — Patient Instructions (Signed)
Medication Instructions:  Continue current medications  *If you need a refill on your cardiac medications before your next appointment, please call your pharmacy*   Lab Work: None Ordered   Testing/Procedures: None Ordered   Follow-Up: At [facility], you and your health needs are our priority.  As part of our continuing mission to provide you with exceptional heart care, we have created designated Provider Care Teams.  These Care Teams include your primary Cardiologist (physician) and Advanced Practice Providers (APPs -  Physician Assistants and Nurse Practitioners) who all work together to provide you with the care you need, when you need it.  We recommend signing up for the patient portal called "MyChart".  Sign up information is provided on this After Visit Summary.  MyChart is used to connect with patients for Virtual Visits (Telemedicine).  Patients are able to view lab/test results, encounter notes, upcoming appointments, etc.  Non-urgent messages can be sent to your provider as well.   To learn more about what you can do with MyChart, go to [email or web address].    Your next appointment:   6 month(s)  The format for your next appointment:   In Person  Provider:   You may see Quay Burow, MD or one of the following Advanced Practice Providers on your designated Care Team:    Kerin Ransom, PA-C  [place], [place]  Coletta Memos, [place]    Other Instructions Thursday May 13th @ 10:45 am with Dr Debara Pickett

## 2019-09-11 ENCOUNTER — Inpatient Hospital Stay: Payer: Medicare HMO | Attending: Nurse Practitioner

## 2019-09-11 ENCOUNTER — Inpatient Hospital Stay: Payer: Medicare HMO | Admitting: Hematology & Oncology

## 2019-09-19 ENCOUNTER — Telehealth: Payer: Self-pay | Admitting: Hematology & Oncology

## 2019-09-19 NOTE — Telephone Encounter (Signed)
Returned Advertising account executive from patient.  She was requesting to have July appointments moved to June 9 w/ Dr Marin Olp.  I explained ot her that he did not have any available appointments and she could just have labs drawn on that date and keep her July appt to see Dr Marin Olp.  She voiced complete understanding of this appointment was LAB ONLY and NO INFUSION APPT would be made at this time.

## 2019-09-21 ENCOUNTER — Ambulatory Visit: Payer: Medicare HMO | Admitting: Internal Medicine

## 2019-10-03 ENCOUNTER — Encounter: Payer: Self-pay | Admitting: Cardiovascular Disease

## 2019-10-03 ENCOUNTER — Other Ambulatory Visit: Payer: Self-pay

## 2019-10-03 ENCOUNTER — Ambulatory Visit (INDEPENDENT_AMBULATORY_CARE_PROVIDER_SITE_OTHER): Payer: Medicare HMO | Admitting: Cardiovascular Disease

## 2019-10-03 VITALS — BP 164/98 | HR 66 | Ht 65.0 in | Wt 156.0 lb

## 2019-10-03 DIAGNOSIS — Z789 Other specified health status: Secondary | ICD-10-CM

## 2019-10-03 DIAGNOSIS — E785 Hyperlipidemia, unspecified: Secondary | ICD-10-CM | POA: Diagnosis not present

## 2019-10-03 DIAGNOSIS — I255 Ischemic cardiomyopathy: Secondary | ICD-10-CM

## 2019-10-03 DIAGNOSIS — I2510 Atherosclerotic heart disease of native coronary artery without angina pectoris: Secondary | ICD-10-CM

## 2019-10-03 DIAGNOSIS — Z9861 Coronary angioplasty status: Secondary | ICD-10-CM

## 2019-10-03 DIAGNOSIS — I214 Non-ST elevation (NSTEMI) myocardial infarction: Secondary | ICD-10-CM | POA: Diagnosis not present

## 2019-10-03 DIAGNOSIS — I10 Essential (primary) hypertension: Secondary | ICD-10-CM | POA: Diagnosis not present

## 2019-10-03 DIAGNOSIS — Z955 Presence of coronary angioplasty implant and graft: Secondary | ICD-10-CM

## 2019-10-03 NOTE — Assessment & Plan Note (Signed)
History of dyslipidemia intolerant to statin therapy.  I am referring her to our Pharm.D.'s to initiate a PCSK9.

## 2019-10-03 NOTE — Assessment & Plan Note (Addendum)
History of CAD status post PCI and drug-eluting stenting of her mid LAD by myself 05/30/2016 with a synergy drug-eluting stent with an excellent result.  She was doing well until 08/13/2017 when she developed chest pain and was brought to [facility] with a non-STEMI the low troponin rise.  She underwent cardiac catheterization via the right radial approach by Dr. Angelena Form 08/14/2017 revealing a 90% proximal LAD stenosis and a 70% lesion just proximal to the previously placed mid LAD stent both of which underwent restenting with synergy drug-eluting stents.  For care her EF at that time was 35 to 40%.  Because of recurrent chest pain she was recatheterized by Dr. Tamala Julian 11/28/2017 revealing patent stents with a small compromised diagonal branch ostium.  Subsequent Myoview performed 12/24/2017 was low risk and nonischemic.  Her last 2D echo performed 10/11/2017 revealed normal LV function.  She has been on dual antiplatelet therapy including aspirin and Plavix.  She has complained of recent epistaxis and I told her that she can discontinue her Plavix.

## 2019-10-03 NOTE — Patient Instructions (Signed)
Medication Instructions:  Your physician recommends that you continue on your current medications as directed. Please refer to the Current Medication list given to you today.  STOP Plavix  *If you need a refill on your cardiac medications before your next appointment, please call your pharmacy*   Follow-Up: At [facility], you and your health needs are our priority.  As part of our continuing mission to provide you with exceptional heart care, we have created designated Provider Care Teams.  These Care Teams include your primary Cardiologist (physician) and Advanced Practice Providers (APPs -  Physician Assistants and Nurse Practitioners) who all work together to provide you with the care you need, when you need it.  We recommend signing up for the patient portal called "MyChart".  Sign up information is provided on this After Visit Summary.  MyChart is used to connect with patients for Virtual Visits (Telemedicine).  Patients are able to view lab/test results, encounter notes, upcoming appointments, etc.  Non-urgent messages can be sent to your provider as well.   To learn more about what you can do with MyChart, go to [email or web address].    Your next appointment:   6 month(s)  The format for your next appointment:   In Person  Provider:   Jory Sims, DNP   Referral: You have been referred to the [facility] with our Pharmacist to discuss starting [place].  Other Instructions Your physician recommends that you schedule a follow-up appointment in 12 months with Dr. [name]. Please call our office 2 months in advance to schedule a follow-up appointment with Dr. [name].

## 2019-10-03 NOTE — Progress Notes (Signed)
10/03/2019 Caitlyn [name]   1946/04/07  [id number]  Primary Physician Caitlyn Huddle, MD Primary Cardiologist: Caitlyn Harp MD Caitlyn [name], [place]  HPI:  Caitlyn [name] is a 74 y.o.  mildly overweight Caucasian female who I been taking care of for many years. I last saw for a visit video virtual telemedicine visit 09/06/2018. I did perform coronary angiography on her in 2002 demonstrating normal coronary arteries. She does have a strong family history of heart disease. She apparently fell and broke her hip on the ice several days ago and was admitted to the hospital with an intention to perform ORIF of her right hip by Caitlyn [name] . She has had several episodes of chest pain with mildly elevated troponin. I performed cardiac catheterization on her 06/01/16 revealing a 90% mid LAD lesion which I stented with a Synergy drug-eluting stent with an excellent result. She underwent right ORIF for fractured hip one week later by Caitlyn [name] on dualantiplatelettherapy.She underwent physical therapy and rehabilitation.  She was doing well until 08/13/2017 when she developed chest pain was brought to [facility]. She had a "non-STEMI with a very low troponin rise and minimal ST segment changes. The following day, 08/14/2017, she underwent a radial cath by Caitlyn [name] revealing a 90% proximal LAD lesion, and a 70% lesion just proximal to the previous he placed mid LAD stent both of which were stented with synergy drug-eluting stents. Her EF was in the 35 to 40% range, significantly lower than it had been previously which I 6 suspect was related to "stunned myocardium". She does complain of some mild shortness of breath.  She was admitted to the hospital 11/28/2017 with unstable angina and underwent cardiac catheterization radially by Dr. Tamala Julian the following day revealing patent LAD stents, a jailed small diffusely diseased first diagonal branch with otherwise noncritical CAD and normal LV  function. A subsequent Myoview stress test performed 12/24/2017 was low risk and nonischemic ischemic. She saw Caitlyn [name] in the office 12/09/2017 who prescribed low-dose Imdur which she failed to take because of fear of headaches. She also has not been taking her carvedilol as prescribed.  Since I saw virtually a year ago she continues to do well.  She saw Caitlyn [name] back in the office 09/05/2019 and was doing well.  She did have some local reaction to her Covid shot in her left arm.  She also complains of left upper extremity and back pain which sounds radicular.  She apparently had short-term memory loss from her rosuvastatin and we will explore starting her on Repatha.  Otherwise, she denies chest pain or shortness of breath.   Current Meds  Medication Sig  . ALBUTEROL SULFATE HFA IN Inhale 2 puffs into the lungs every 6 (six) hours as needed (shortness of breath/wheezing).  Marland Kitchen aspirin 81 MG EC tablet Take 1 tablet (81 mg total) by mouth daily.  . calcium carbonate (OS-CAL) 600 MG TABS tablet Take 600 mg by mouth daily with breakfast.  . carvedilol (COREG) 25 MG tablet Take 1 tablet (25 mg total) by mouth 2 (two) times daily with a meal. (Patient taking differently: Take 25 mg by mouth 2 (two) times daily. )  . Cholecalciferol (VITAMIN D PO) Take 3,000 Units by mouth daily.   . diphenoxylate-atropine (LOMOTIL) 2.5-0.025 MG tablet   . escitalopram (LEXAPRO) 10 MG tablet Take 10 mg by mouth daily.  . fenofibrate 160 MG tablet TAKE 1 TABLET BY MOUTH DAILY  . ibuprofen (ADVIL)  400 MG tablet Take 1 tablet (400 mg total) by mouth every 8 (eight) hours as needed for moderate pain. Take 1 tablet tid for the first 3 days and then prn for severe pain  . levothyroxine (SYNTHROID, LEVOTHROID) 88 MCG tablet Take 88 mcg by mouth daily before breakfast. Must be NAME BRAND "Synthroid"  . lipase/protease/amylase (CREON) 36000 UNITS CPEP capsule Take 36,000-72,000 Units by mouth as needed. Take one capsule  (36,000 units) by mouth every morning, may increase to two capsules (72,000 units) as needed for diarrhea  . losartan (COZAAR) 100 MG tablet TAKE 1 TABLET BY MOUTH DAILY  . methocarbamol (ROBAXIN) 500 MG tablet Take 1 tablet (500 mg total) by mouth every 6 (six) hours as needed for muscle spasms.  . metroNIDAZOLE (FLAGYL) 250 MG tablet   . nitroGLYCERIN (NITROSTAT) 0.4 MG SL tablet Place 0.4 mg under the tongue every 5 (five) minutes as needed for chest pain.  . [DISCONTINUED] clopidogrel (PLAVIX) 75 MG tablet Take 1 tablet (75 mg total) by mouth daily with breakfast.     Allergies  Allergen Reactions  . Cortisone Anaphylaxis    Some forms of cortisone  . Crestor [Rosuvastatin Calcium] Other (See Comments)    Memory loss for 3 months  . Demerol [Meperidine] Anaphylaxis  . Monosodium Glutamate Anaphylaxis  . Morphine And Related Anaphylaxis, Nausea And Vomiting and Other (See Comments)  . Prednisone Anaphylaxis    confusion  . Shellfish Allergy Anaphylaxis  . Sulfa Antibiotics Anaphylaxis  . Buprenorphine Hcl Nausea And Vomiting and Hypertension    SHAKING  . Hctz [Hydrochlorothiazide] Other (See Comments)    Dizziness   . Readi-Cat [Barium Sulfate] Diarrhea    Patient does not want to drink Readi-Cat because it gives her diarrhea  . Xanax [Alprazolam] Other (See Comments)    "makes me crazy"  . Benzoic Acid Nausea Only    Social History   Socioeconomic History  . Marital status: Widowed    Spouse name: Not on file  . Number of children: 1  . Years of education: Not on file  . Highest education level: Not on file  Occupational History  . Occupation: retired  Tobacco Use  . Smoking status: Never Smoker  . Smokeless tobacco: Never Used  Substance and Sexual Activity  . Alcohol use: Yes    Alcohol/week: 0.0 standard drinks    Comment: occasional   . Drug use: No  . Sexual activity: Not on file  Other Topics Concern  . Not on file  Social History Narrative  . Not on  file   Social Determinants of Health   Financial Resource Strain:   . Difficulty of Paying Living Expenses:   Food Insecurity:   . Worried About Charity fundraiser in the Last Year:   . Arboriculturist in the Last Year:   Transportation Needs:   . Film/video editor (Medical):   Marland Kitchen Lack of Transportation (Non-Medical):   Physical Activity:   . Days of Exercise per Week:   . Minutes of Exercise per Session:   Stress:   . Feeling of Stress :   Social Connections:   . Frequency of Communication with Friends and Family:   . Frequency of Social Gatherings with Friends and Family:   . Attends Religious Services:   . Active Member of Clubs or Organizations:   . Attends Archivist Meetings:   Marland Kitchen Marital Status:   Intimate Partner Violence:   . Fear of Current or  Ex-Partner:   . Emotionally Abused:   Marland Kitchen Physically Abused:   . Sexually Abused:      Review of Systems: General: negative for chills, fever, night sweats or weight changes.  Cardiovascular: negative for chest pain, dyspnea on exertion, edema, orthopnea, palpitations, paroxysmal nocturnal dyspnea or shortness of breath Dermatological: negative for rash Respiratory: negative for cough or wheezing Urologic: negative for hematuria Abdominal: negative for nausea, vomiting, diarrhea, bright red blood per rectum, melena, or hematemesis Neurologic: negative for visual changes, syncope, or dizziness All other systems reviewed and are otherwise negative except as noted above.    Blood pressure (!) 164/98, pulse 66, height 5\' 5"  (1.651 m), weight 156 lb (70.8 kg), SpO2 97 %.  General appearance: alert and no distress Neck: no adenopathy, no carotid bruit, no JVD, supple, symmetrical, trachea midline and thyroid not enlarged, symmetric, no tenderness/mass/nodules Lungs: clear to auscultation bilaterally Heart: regular rate and rhythm, S1, S2 normal, no murmur, click, rub or gallop Extremities: extremities normal,  atraumatic, no cyanosis or edema Pulses: 2+ and symmetric Skin: Skin color, texture, turgor normal. No rashes or lesions Neurologic: Alert and oriented X 3, normal strength and tone. Normal symmetric reflexes. Normal coordination and gait  EKG sinus rhythm at 66 without ST or T wave changes.  I personally reviewed this EKG.  ASSESSMENT AND PLAN:   Essential hypertension History of essential hypertension a blood pressure measured today at 164/98.  She does state that she has "whitecoat hypertension".  She is on losartan, and carvedilol.  Continue current meds.  Dyslipidemia, goal LDL below 70 History of dyslipidemia intolerant to statin therapy.  I am referring her to our Pharm.D.'s to initiate a PCSK9.  Status post insertion of drug-eluting stent into left anterior descending (LAD) artery History of CAD status post PCI and drug-eluting stenting of her mid LAD by myself 05/30/2016 with a synergy drug-eluting stent with an excellent result.  She was doing well until 08/13/2017 when she developed chest pain and was brought to [facility] with a non-STEMI the low troponin rise.  She underwent cardiac catheterization via the right radial approach by Caitlyn [name] 08/14/2017 revealing a 90% proximal LAD stenosis and a 70% lesion just proximal to the previously placed mid LAD stent both of which underwent restenting with synergy drug-eluting stents.  For care her EF at that time was 35 to 40%.  Because of recurrent chest pain she was recatheterized by Dr. Tamala Julian 11/28/2017 revealing patent stents with a small compromised diagonal branch ostium.  Subsequent Myoview performed 12/24/2017 was low risk and nonischemic.  Her last 2D echo performed 10/11/2017 revealed normal LV function.      Caitlyn Harp MD FACP,FACC,FAHA, [facility] 10/03/2019 12:38 PM

## 2019-10-03 NOTE — Assessment & Plan Note (Signed)
History of essential hypertension a blood pressure measured today at 164/98.  She does state that she has "whitecoat hypertension".  She is on losartan, and carvedilol.  Continue current meds.

## 2019-10-10 ENCOUNTER — Telehealth: Payer: Self-pay | Admitting: Adult Health

## 2019-10-10 NOTE — Telephone Encounter (Signed)
Spoke with patient. Informed patient someone will reach out to schedule pharm D appointment for the lipid clinic and patient is to follow up with Jory Sims, DNP in 6 months. Patient verbalized understanding.

## 2019-10-10 NOTE — Telephone Encounter (Signed)
Follow Up  Talked with pt and she stated that she has just seen Dr. [name] and he would like for her to see Jory Sims because of her chlorestoral. She just doesn't know when he said to see her. Please call to discuss

## 2019-10-10 NOTE — Telephone Encounter (Signed)
New Message  Talked with pt and she stated that she has just seen Dr. [name] and he would like for her to see Jory Sims because of her chlorestoral. She just doesn't know when he said to see her. Please call to discuss

## 2019-10-18 ENCOUNTER — Other Ambulatory Visit: Payer: Self-pay

## 2019-10-18 ENCOUNTER — Inpatient Hospital Stay: Payer: Medicare Other | Attending: Nurse Practitioner

## 2019-10-18 DIAGNOSIS — D509 Iron deficiency anemia, unspecified: Secondary | ICD-10-CM | POA: Insufficient documentation

## 2019-10-18 DIAGNOSIS — K909 Intestinal malabsorption, unspecified: Secondary | ICD-10-CM

## 2019-10-18 DIAGNOSIS — D508 Other iron deficiency anemias: Secondary | ICD-10-CM

## 2019-10-18 LAB — CBC WITH DIFFERENTIAL (CANCER CENTER ONLY)
Abs Immature Granulocytes: 0.08 10*3/uL — ABNORMAL HIGH (ref 0.00–0.07)
Basophils Absolute: 0 10*3/uL (ref 0.0–0.1)
Basophils Relative: 1 %
Eosinophils Absolute: 0.1 10*3/uL (ref 0.0–0.5)
Eosinophils Relative: 2 %
HCT: 38.8 % (ref 36.0–46.0)
Hemoglobin: 12.6 g/dL (ref 12.0–15.0)
Immature Granulocytes: 1 %
Lymphocytes Relative: 25 %
Lymphs Abs: 1.7 10*3/uL (ref 0.7–4.0)
MCH: 29.6 pg (ref 26.0–34.0)
MCHC: 32.5 g/dL (ref 30.0–36.0)
MCV: 91.1 fL (ref 80.0–100.0)
Monocytes Absolute: 0.6 10*3/uL (ref 0.1–1.0)
Monocytes Relative: 9 %
Neutro Abs: 4.3 10*3/uL (ref 1.7–7.7)
Neutrophils Relative %: 62 %
Platelet Count: 197 10*3/uL (ref 150–400)
RBC: 4.26 MIL/uL (ref 3.87–5.11)
RDW: 12.7 % (ref 11.5–15.5)
WBC Count: 6.8 10*3/uL (ref 4.0–10.5)
nRBC: 0 % (ref 0.0–0.2)

## 2019-10-18 LAB — RETICULOCYTES
Immature Retic Fract: 13.9 % (ref 2.3–15.9)
RBC.: 4.19 MIL/uL (ref 3.87–5.11)
Retic Count, Absolute: 92.2 10*3/uL (ref 19.0–186.0)
Retic Ct Pct: 2.2 % (ref 0.4–3.1)

## 2019-10-19 LAB — IRON AND TIBC
Iron: 68 ug/dL (ref 41–142)
Saturation Ratios: 20 % — ABNORMAL LOW (ref 21–57)
TIBC: 344 ug/dL (ref 236–444)
UIBC: 276 ug/dL (ref 120–384)

## 2019-10-19 LAB — FERRITIN: Ferritin: 33 ng/mL (ref 11–307)

## 2019-10-24 ENCOUNTER — Telehealth: Payer: Self-pay | Admitting: Hematology & Oncology

## 2019-10-24 NOTE — Telephone Encounter (Signed)
Called and spoke with patient about infusion appointment being added for Friday 6/18 @ 1:00.  She agreed to both date/time per 6/15 result note

## 2019-10-27 ENCOUNTER — Other Ambulatory Visit: Payer: Self-pay

## 2019-10-27 ENCOUNTER — Inpatient Hospital Stay: Payer: Medicare Other

## 2019-10-27 VITALS — BP 154/67 | HR 70 | Temp 97.1°F | Resp 17

## 2019-10-27 DIAGNOSIS — D509 Iron deficiency anemia, unspecified: Secondary | ICD-10-CM | POA: Diagnosis not present

## 2019-10-27 DIAGNOSIS — D508 Other iron deficiency anemias: Secondary | ICD-10-CM

## 2019-10-27 MED ORDER — SODIUM CHLORIDE 0.9 % IV SOLN
Freq: Once | INTRAVENOUS | Status: DC
  Filled 2019-10-27: qty 250

## 2019-10-27 MED ORDER — SODIUM CHLORIDE 0.9 % IV SOLN
200.0000 mg | Freq: Once | INTRAVENOUS | Status: AC
  Administered 2019-10-27: 200 mg via INTRAVENOUS
  Filled 2019-10-27: qty 200

## 2019-10-27 NOTE — Patient Instructions (Signed)

## 2019-11-16 ENCOUNTER — Ambulatory Visit: Payer: PPO | Admitting: Hematology & Oncology

## 2019-11-16 ENCOUNTER — Other Ambulatory Visit: Payer: PPO

## 2019-11-16 ENCOUNTER — Ambulatory Visit (INDEPENDENT_AMBULATORY_CARE_PROVIDER_SITE_OTHER): Payer: Medicare Other | Admitting: Pharmacist Clinician (PhC)/ Clinical Pharmacy Specialist

## 2019-11-16 ENCOUNTER — Other Ambulatory Visit: Payer: Self-pay

## 2019-11-16 VITALS — BP 144/80 | HR 64 | Resp 14 | Ht 65.0 in | Wt 161.8 lb

## 2019-11-16 DIAGNOSIS — T466X5A Adverse effect of antihyperlipidemic and antiarteriosclerotic drugs, initial encounter: Secondary | ICD-10-CM

## 2019-11-16 DIAGNOSIS — G720 Drug-induced myopathy: Secondary | ICD-10-CM

## 2019-11-16 DIAGNOSIS — E785 Hyperlipidemia, unspecified: Secondary | ICD-10-CM

## 2019-11-16 NOTE — Patient Instructions (Signed)
Your Results:             Your most recent labs Goal  Total Cholesterol  < 200  Triglycerides  < 150  HDL (happy/good cholesterol)  > 40  LDL (lousy/bad cholesterol  < 70      Medication changes:  I will call you tomorrow with the lab results and we can determine which medication will be best for you.  Lab orders:  Go to the lab today for cholesterol and liver function tests  Patient Assistance:  The [employer] foundation offers assistance to help pay for medication copays.  They will cover copays for all cholesterol lowering meds, including statins, fibrates, omega-3 oils, ezetimibe, Repatha, Praluent, Nexletol, Nexlizet.  The cards are usually good for $2,500 or 12 months, whichever comes first. 1. Go to healthwellfoundation.org 2. Click on "Apply Now" 3. Answer questions as to whom is applying (patient or representative) 4. Your disease fund will be "hypercholesterolemia - Medicare access" 5. They will ask questions about finances and which medications you are taking for cholesterol 6. When you submit, the approval is usually within minutes.  You will need to print the card information from the site 7. You will need to show this information to your pharmacy, they will bill your Medicare Part D plan first -then bill [employer] Well --for the copay.   You can also call them at [phone], although the hold times can be quite long.   Thank you for choosing [facility]

## 2019-11-16 NOTE — Progress Notes (Signed)
11/18/2019 [name] 1946-01-08 [id number]   HPI:  Caitlyn [name] is a 74 y.o. female patient of Dr [name], who presents today for a lipid clinic evaluation.  See pertinent past medical history below.  She saw Dr. [name] in May and complained about problems with short term memory loss while taking rosuvastatin.  She has a family history of Alzheimer's (mother) and was rather concerned.  She discontinued the rosuvastatin on her own and noted that within a couple of weeks felt her memory was back to normal.  She tried atorvastatin several years ago, this caused myalgias.  She is hesitant to try another statin drug at this point.    Past Medical History: ASCVD 1/18: DES to mid LAD; NSTEMI 4/19 DES x 2 proximal LAD  hypertension 164/98 at last visit - on carvedilol, losartan  hypothyroidism TSH 0.638 (7/19) on levothyroxine 88 mcg  Iron deficiency anemia Followed by heme-onc    Current Medications: none   Cholesterol Goals: LDL <70   Intolerant/previously tried: rosuvastatin - memory issues;  atorvastatin - myalgias  Family history: mother passed at 72 Alzheimer's, AFib; father died at 31 with multiple MI (first at 9), CABG x 5 (over 2-3 surgeries);  1 brother with 3 strokes, first at 20, still living, 1 sister unknow heart issues, daughter healthy  Diet: mostly home cooked, plenty of salads, vegetables; some chicken (not a big meat eater); snacks on potato chips and cookies occasionally  Exercise:  Walk regularly  Labs: no current labs, patient will get drawn today   Current Outpatient Medications  Medication Sig Dispense Refill   ALBUTEROL SULFATE HFA IN Inhale 2 puffs into the lungs every 6 (six) hours as needed (shortness of breath/wheezing).     aspirin 81 MG EC tablet Take 1 tablet (81 mg total) by mouth daily. 30 tablet 11   carvedilol (COREG) 25 MG tablet Take 1 tablet (25 mg total) by mouth 2 (two) times daily with a meal. (Patient taking differently: Take 25 mg by mouth 2  (two) times daily. ) 60 tablet 6   Cholecalciferol (VITAMIN D PO) Take 3,000 Units by mouth daily.      escitalopram (LEXAPRO) 10 MG tablet Take 10 mg by mouth daily.     fenofibrate 160 MG tablet TAKE 1 TABLET BY MOUTH DAILY 30 tablet 6   ibuprofen (ADVIL) 400 MG tablet Take 1 tablet (400 mg total) by mouth every 8 (eight) hours as needed for moderate pain. Take 1 tablet tid for the first 3 days and then prn for severe pain 15 tablet 0   levothyroxine (SYNTHROID, LEVOTHROID) 88 MCG tablet Take 88 mcg by mouth daily before breakfast. Must be NAME BRAND "Synthroid"     losartan (COZAAR) 100 MG tablet TAKE 1 TABLET BY MOUTH DAILY 90 tablet 0   methocarbamol (ROBAXIN) 500 MG tablet Take 1 tablet (500 mg total) by mouth every 6 (six) hours as needed for muscle spasms. 30 tablet 3   nitroGLYCERIN (NITROSTAT) 0.4 MG SL tablet Place 0.4 mg under the tongue every 5 (five) minutes as needed for chest pain.     No current facility-administered medications for this visit.   Facility-Administered Medications Ordered in Other Visits  Medication Dose Route Frequency Provider Last Rate Last Admin   fentaNYL (SUBLIMAZE) injection    Anesthesia Intra-op Marinda Elk A, CRNA   50 mcg at 05/31/16 3810   lactated ringers infusion    Continuous PRN Marinda Elk A, CRNA 50 mL/hr at 06/03/16 2300 Rate Verify  at 06/03/16 2300    Allergies  Allergen Reactions   Cortisone Anaphylaxis    Some forms of cortisone   Crestor [Rosuvastatin Calcium] Other (See Comments)    Memory loss for 3 months   Demerol [Meperidine] Anaphylaxis   Monosodium Glutamate Anaphylaxis   Morphine And Related Anaphylaxis, Nausea And Vomiting and Other (See Comments)   Prednisone Anaphylaxis    confusion   Shellfish Allergy Anaphylaxis   Sulfa Antibiotics Anaphylaxis   Buprenorphine Hcl Nausea And Vomiting and Hypertension    SHAKING   Hctz [Hydrochlorothiazide] Other (See Comments)    Dizziness    Readi-Cat  [Barium Sulfate] Diarrhea    Patient does not want to drink Readi-Cat because it gives her diarrhea   Xanax [Alprazolam] Other (See Comments)    "makes me crazy"   Benzoic Acid Nausea Only    Past Medical History:  Diagnosis Date   Anemia    Arthritis    Bleeding diathesis ([place]) 02/22/2013   Closed right hip fracture ([place]) 05/28/2016   Coronary artery disease    E coli enteritis    GERD (gastroesophageal reflux disease)    H/O hiatal hernia    Head injury    Hypertension    Iron deficiency anemia, unspecified 02/22/2013   Mass of right kidney 08/22/2013   2.5 cm solid lesion seen on 5/14 CT   Myocardial infarction [facility])    Radicular pain of thoracic region 08/22/2013   Left T-8 started after pushing heavy chest 1/15; intermittent; no focal neuro deficit    Blood pressure (!) 144/80, pulse 64, resp. rate 14, height 5\' 5"  (1.651 m), weight 161 lb 12.8 oz (73.4 kg), SpO2 97 %.   Dyslipidemia, goal LDL below 70 Patient with history of ASCVD, failed on 2 statin drugs.  Reviewed options for lowering LDL cholesterol, including ezetimibe, PCSK-9 inhibitors and bempedoic acid.  Discussed mechanisms of action, dosing, side effects and potential decreases in LDL cholesterol.  Answered all patient questions.  Based on this information, patient would prefer to start [place].  However, her most recent labs are from 2019, so those will need to be repeated today.  Once we get the results, we will review with patient and start PA process to get Repatha covered.     Tommy Medal PharmD CPP [facility] [address] [place], [place] [address] [phone]

## 2019-11-17 LAB — HEPATIC FUNCTION PANEL
ALT: 9 IU/L (ref 0–32)
AST: 14 IU/L (ref 0–40)
Albumin: 4.2 g/dL (ref 3.7–4.7)
Alkaline Phosphatase: 81 IU/L (ref 48–121)
Bilirubin Total: 0.6 mg/dL (ref 0.0–1.2)
Bilirubin, Direct: 0.13 mg/dL (ref 0.00–0.40)
Total Protein: 6.6 g/dL (ref 6.0–8.5)

## 2019-11-17 LAB — LIPID PANEL
Chol/HDL Ratio: 6 ratio — ABNORMAL HIGH (ref 0.0–4.4)
Cholesterol, Total: 262 mg/dL — ABNORMAL HIGH (ref 100–199)
HDL: 44 mg/dL (ref >39)
LDL Chol Calc (NIH): 174 mg/dL — ABNORMAL HIGH (ref 0–99)
Triglycerides: 233 mg/dL — ABNORMAL HIGH (ref 0–149)
VLDL Cholesterol Cal: 44 mg/dL — ABNORMAL HIGH (ref 5–40)

## 2019-11-18 ENCOUNTER — Encounter: Payer: Self-pay | Admitting: Pharmacist Clinician (PhC)/ Clinical Pharmacy Specialist

## 2019-11-18 NOTE — Assessment & Plan Note (Signed)
Patient with history of ASCVD, failed on 2 statin drugs.  Reviewed options for lowering LDL cholesterol, including ezetimibe, PCSK-9 inhibitors and bempedoic acid.  Discussed mechanisms of action, dosing, side effects and potential decreases in LDL cholesterol.  Answered all patient questions.  Based on this information, patient would prefer to start [place].  However, her most recent labs are from 2019, so those will need to be repeated today.  Once we get the results, we will review with patient and start PA process to get Repatha covered.

## 2019-11-20 ENCOUNTER — Telehealth: Payer: Self-pay

## 2019-11-20 MED ORDER — REPATHA SURECLICK 140 MG/ML ~~LOC~~ SOAJ: 140.0000 mg | SUBCUTANEOUS | 11 refills | Status: DC

## 2019-11-20 NOTE — Telephone Encounter (Signed)
Called and spoke w/pt regarding the approval of repatha, rx sent, pt advised to call back in unaffordable, pt voiced understanding

## 2019-11-29 ENCOUNTER — Telehealth: Payer: Self-pay | Admitting: Hematology & Oncology

## 2019-11-29 NOTE — Telephone Encounter (Signed)
Called and LMVM for patient and advised that I had cancelled her 7/27 appts as requested.  Also advised her to call back to reschedule per 7/20 sch msg

## 2019-12-05 ENCOUNTER — Inpatient Hospital Stay: Payer: Medicare Other | Admitting: Hematology & Oncology

## 2019-12-13 ENCOUNTER — Other Ambulatory Visit: Payer: Self-pay | Admitting: *Deleted

## 2019-12-13 DIAGNOSIS — D508 Other iron deficiency anemias: Secondary | ICD-10-CM

## 2019-12-13 DIAGNOSIS — K909 Intestinal malabsorption, unspecified: Secondary | ICD-10-CM

## 2019-12-14 ENCOUNTER — Ambulatory Visit: Payer: Medicare Other | Admitting: Hematology & Oncology

## 2019-12-14 ENCOUNTER — Other Ambulatory Visit: Payer: Medicare Other

## 2019-12-18 ENCOUNTER — Telehealth: Payer: Self-pay | Admitting: Cardiovascular Disease

## 2019-12-18 NOTE — Telephone Encounter (Signed)
Spoke to patient she was concerned about Covid delta variant.Stated she has received both covid vaccines.Advised to wear mask when she is in public.Continue to stay 6 ft apart.Keep hands washed.Advised to call PCP for more advice.

## 2019-12-18 NOTE — Telephone Encounter (Signed)
Patient calling about the delta varient of covid. She states she has been reading about it and would like to know what she can do to protect herself.

## 2020-01-08 ENCOUNTER — Telehealth: Payer: Self-pay | Admitting: Adult Health

## 2020-01-08 ENCOUNTER — Telehealth: Payer: Self-pay | Admitting: Cardiovascular Disease

## 2020-01-08 NOTE — Telephone Encounter (Signed)
Repatha question.  Will route to PharmD to advise.  Thank you!

## 2020-01-08 NOTE — Telephone Encounter (Signed)
Pa submitted for pa 75 will await determination

## 2020-01-08 NOTE — Telephone Encounter (Signed)
Called patient to try get follow up scheduled with [facility] from recall list, no answer and unable to lvm because it was full

## 2020-01-08 NOTE — Telephone Encounter (Signed)
Severe diarrhea immediately after Repatha administration.  Will submit prior-authorization for Praluent 75mg  every 14 days and follow up with patient as needed.

## 2020-01-08 NOTE — Telephone Encounter (Signed)
     Pt c/o medication issue:  1. Name of Medication:   Evolocumab (REPATHA SURECLICK) 591 MG/ML SOAJ     2. How are you currently taking this medication (dosage and times per day)? Inject 140 mg into the skin every 14 (fourteen) days.  3. Are you having a reaction (difficulty breathing--STAT)?  4. What is your medication issue? Pt said after her first month taking this medication, right after taking the 2 dose of the month, immediately she felt the urgency to go to the bathroom, unfortunately she was not fast enough to go to the bathroom, she took it again the follow wing month and the same thing happened. She said she can't handle it anymore and she may need a different medication.

## 2020-01-09 MED ORDER — PRALUENT 75 MG/ML ~~LOC~~ SOAJ: 75.0000 mg | SUBCUTANEOUS | 11 refills | Status: DC

## 2020-01-09 NOTE — Telephone Encounter (Signed)
Patient is calling to follow up regarding medication changes. She would like a call back to discuss the prior-authorization process.

## 2020-01-09 NOTE — Telephone Encounter (Signed)
Routed to [facility] for assistance

## 2020-01-09 NOTE — Telephone Encounter (Signed)
Called and spoke w/pt regarding the approval of the praluent since they were having issues on repatha.... rx sent, instructed the pt to use the same [employer] foundation grant that they were using for the repatha that it should also cover the cost of the praluent as well... the pt voiced understanding

## 2020-01-09 NOTE — Addendum Note (Signed)
Addended by: Allean Found on: 01/09/2020 01:35 PM   Modules accepted: Orders

## 2020-01-10 MED ORDER — PRALUENT 75 MG/ML ~~LOC~~ SOAJ: 75.0000 mg | SUBCUTANEOUS | 11 refills | Status: DC

## 2020-01-10 NOTE — Telephone Encounter (Signed)
Called and spoke w/pt and got the praluent sent to [facility] on [address] pt voiced understanding

## 2020-01-10 NOTE — Telephone Encounter (Signed)
   Pt is calling back, she said her pharmacy [facility] doesn't carry Praluent and need help where she can get it.

## 2020-01-10 NOTE — Addendum Note (Signed)
Addended by: Allean Found on: 01/10/2020 10:20 AM   Modules accepted: Orders

## 2020-01-16 ENCOUNTER — Encounter (HOSPITAL_COMMUNITY): Payer: Self-pay | Admitting: Emergency Medicine

## 2020-01-16 ENCOUNTER — Emergency Department (HOSPITAL_COMMUNITY): Payer: Medicare Other

## 2020-01-16 ENCOUNTER — Emergency Department (HOSPITAL_COMMUNITY)
Admission: EM | Admit: 2020-01-16 | Discharge: 2020-01-16 | Disposition: A | Discharge status: Home / Self Care | Payer: Medicare Other | Attending: Emergency Medicine | Admitting: Emergency Medicine

## 2020-01-16 ENCOUNTER — Telehealth: Payer: Self-pay | Admitting: Cardiovascular Disease

## 2020-01-16 DIAGNOSIS — Z5321 Procedure and treatment not carried out due to patient leaving prior to being seen by health care provider: Secondary | ICD-10-CM | POA: Insufficient documentation

## 2020-01-16 DIAGNOSIS — R079 Chest pain, unspecified: Secondary | ICD-10-CM | POA: Diagnosis present

## 2020-01-16 DIAGNOSIS — R0602 Shortness of breath: Secondary | ICD-10-CM | POA: Diagnosis not present

## 2020-01-16 LAB — BASIC METABOLIC PANEL
Anion gap: 13 (ref 5–15)
BUN: 11 mg/dL (ref 8–23)
CO2: 22 mmol/L (ref 22–32)
Calcium: 9.7 mg/dL (ref 8.9–10.3)
Chloride: 106 mmol/L (ref 98–111)
Creatinine, Ser: 0.66 mg/dL (ref 0.44–1.00)
GFR calc Af Amer: >60 mL/min (ref >60)
GFR calc non Af Amer: >60 mL/min (ref >60)
Glucose, Bld: 121 mg/dL — ABNORMAL HIGH (ref 70–99)
Potassium: 3.7 mmol/L (ref 3.5–5.1)
Sodium: 141 mmol/L (ref 135–145)

## 2020-01-16 LAB — CBC
HCT: 44.7 % (ref 36.0–46.0)
Hemoglobin: 14.7 g/dL (ref 12.0–15.0)
MCH: 29.8 pg (ref 26.0–34.0)
MCHC: 32.9 g/dL (ref 30.0–36.0)
MCV: 90.7 fL (ref 80.0–100.0)
Platelets: 242 10*3/uL (ref 150–400)
RBC: 4.93 MIL/uL (ref 3.87–5.11)
RDW: 13.4 % (ref 11.5–15.5)
WBC: 6.8 10*3/uL (ref 4.0–10.5)
nRBC: 0 % (ref 0.0–0.2)

## 2020-01-16 LAB — TROPONIN I (HIGH SENSITIVITY): Troponin I (High Sensitivity): 3 ng/L (ref <18)

## 2020-01-16 NOTE — Telephone Encounter (Signed)
Tried to call patient back. No answer, no voicemail to leave message.

## 2020-01-16 NOTE — Telephone Encounter (Signed)
Received a call from patient she stated for the last 3 days she has been having left arm pain radiating around into left back.No chest pain.Stated pain is similar to pain she had when she had heart attack.Rates pain # 8.Advised to call 911.

## 2020-01-16 NOTE — ED Triage Notes (Signed)
Pt endorses left arm, chest pain that radiates to back. Hx of 4 stents and states this is similar feeling to previous MI. Endorses mild SOB. 324 ASA,1 nitro given by EMS.

## 2020-01-16 NOTE — Telephone Encounter (Signed)
° ° °  Pt c/o of Chest Pain: STAT if CP now or developed within 24 hours  1. Are you having CP right now? She can feel the pain on her left arm  2. Are you experiencing any other symptoms (ex. SOB, nausea, vomiting, sweating)? Nose bleed  3. How long have you been experiencing CP? 1 week  4. Is your CP continuous or coming and going?  5. Have you taken Nitroglycerin? no ? Pt is calling back again, she said she is feeling having another heart attack, she can feel pain up and down on her left arm, and her nose is bleeding and she thinks there's blood in her urin as well

## 2020-01-16 NOTE — Telephone Encounter (Signed)
Patient is returning call.  °

## 2020-01-16 NOTE — ED Notes (Signed)
Pt notified staff that she is leaving.

## 2020-01-16 NOTE — Telephone Encounter (Signed)
Caitlyn [name] is calling stating she has been experiencing left arm pain that goes to her back behind her heart for about two weeks. She states it is on and off and the pain gets worse when she is laying down, but no other symptoms are present. She states this is the same pain she was experiencing prior to her last heart attack. Please advise.

## 2020-01-17 ENCOUNTER — Telehealth: Payer: Self-pay | Admitting: Cardiovascular Disease

## 2020-01-17 NOTE — Telephone Encounter (Signed)
Per pt was in ED yesterday and left prior to being evaluated Per pt had MI yesterday Pt did have labs and EKG was done and both were normal Appt made with Dr [name] for 01/22/20 at 2:00 pm at pt's request .Will forward to Dr [name] for review .Adonis Housekeeper

## 2020-01-17 NOTE — Telephone Encounter (Signed)
    Pt would like to see Dr. [name], she said she was in the ED yesterday and waited 11 hours and was not seen by a doctor. Gave first available in October. Pt said she knows Dr. [name] and need to see him sooner. She doesn't want to see APP.

## 2020-01-17 NOTE — Telephone Encounter (Signed)
Thx.  JJB 

## 2020-01-22 ENCOUNTER — Other Ambulatory Visit: Payer: Self-pay

## 2020-01-22 ENCOUNTER — Encounter: Payer: Self-pay | Admitting: Cardiovascular Disease

## 2020-01-22 ENCOUNTER — Ambulatory Visit: Payer: Medicare Other | Admitting: Cardiovascular Disease

## 2020-01-22 VITALS — BP 162/96 | HR 81 | Ht 65.0 in | Wt 143.0 lb

## 2020-01-22 DIAGNOSIS — E785 Hyperlipidemia, unspecified: Secondary | ICD-10-CM | POA: Diagnosis not present

## 2020-01-22 DIAGNOSIS — R0602 Shortness of breath: Secondary | ICD-10-CM

## 2020-01-22 DIAGNOSIS — I10 Essential (primary) hypertension: Secondary | ICD-10-CM | POA: Diagnosis not present

## 2020-01-22 DIAGNOSIS — Z955 Presence of coronary angioplasty implant and graft: Secondary | ICD-10-CM

## 2020-01-22 DIAGNOSIS — R079 Chest pain, unspecified: Secondary | ICD-10-CM | POA: Diagnosis not present

## 2020-01-22 MED ORDER — LOSARTAN POTASSIUM 50 MG PO TABS: 50.0000 mg | ORAL_TABLET | Freq: Every day | ORAL | 3 refills | Status: DC

## 2020-01-22 NOTE — Assessment & Plan Note (Signed)
History of essential potential blood pressure measured today 162/96.  She is on carvedilol and was on losartan when I saw her 3 months ago but for some reason this is not on her list.  We will restart losartan 50 mg a day.

## 2020-01-22 NOTE — Assessment & Plan Note (Signed)
History of hyperlipidemia intolerant to PCSK9 and statin drugs.  I did refer her to the lipid clinic for further evaluation

## 2020-01-22 NOTE — Progress Notes (Signed)
01/22/2020 Caitlyn [name]   [birth year]  [id number]  Primary Physician Josetta Huddle, MD Primary Cardiologist: Lorretta Harp MD [name], [place]  HPI:  Caitlyn [name] is a 74 y.o.   mildly overweight Caucasian female who I been taking care of for many years. I last saw in the office 10/03/2019. I did perform coronary angiography on her in 2002 demonstrating normal coronary arteries. She does have a strong family history of heart disease. She apparently fell and broke her hip on the ice several days ago and was admitted to the hospital with an intention to perform ORIF of her right hip by Dr. Lyla Glassing . She has had several episodes of chest pain with mildly elevated troponin. I performed cardiac catheterization on her 06/01/16 revealing a 90% mid LAD lesion which I stented with a Synergy drug-eluting stent with an excellent result. She underwent right ORIF for fractured hip one week later by Dr. Lyla Glassing on dualantiplatelettherapy.She underwent physical therapy and rehabilitation.  She was doing well until 08/13/2017 when she developed chest pain was brought to [facility]. She had a "non-STEMI with a very low troponin rise and minimal ST segment changes. The following day, 08/14/2017, she underwent a radial cath by Dr. Angelena Form revealing a 90% proximal LAD lesion, and a 70% lesion just proximal to the previous he placed mid LAD stent both of which were stented with synergy drug-eluting stents. Her EF was in the 35 to 40% range, significantly lower than it had been previously which I 6 suspect was related to "stunned myocardium". She does complain of some mild shortness of breath.  She was admitted to the hospital 11/28/2017 with unstable angina and underwent cardiac catheterization radially by Dr. Tamala Julian the following day revealing patent LAD stents, a jailed small diffusely diseased first diagonal branch with otherwise noncritical CAD and normal LV function. A subsequent Myoview  stress test performed 12/24/2017 was low risk and nonischemic ischemic. She saw Kerin Ransom in the office 12/09/2017 who prescribed low-dose Imdur which she failed to take because of fear of headaches. She also has not been taking her carvedilol as prescribed.  Since I saw  her in the office 3 months ago she was tried on Repatha and Praluent but was intolerant to these.  She went to the ER approximate week ago but left after 11 hours having not been seen with dyspnea and back pain which is her anginal equivalent.  She also has chronic left upper extremity discomfort which sounds radicular.   Current Meds  Medication Sig  . ALBUTEROL SULFATE HFA IN Inhale 2 puffs into the lungs every 6 (six) hours as needed (shortness of breath/wheezing).  Marland Kitchen aspirin 81 MG EC tablet Take 1 tablet (81 mg total) by mouth daily.  . carvedilol (COREG) 25 MG tablet Take 1 tablet (25 mg total) by mouth 2 (two) times daily with a meal. (Patient taking differently: Take 25 mg by mouth 2 (two) times daily. )  . Cholecalciferol (VITAMIN D PO) Take 3,000 Units by mouth daily.   Marland Kitchen escitalopram (LEXAPRO) 10 MG tablet Take 10 mg by mouth daily.  Marland Kitchen ibuprofen (ADVIL) 400 MG tablet Take 1 tablet (400 mg total) by mouth every 8 (eight) hours as needed for moderate pain. Take 1 tablet tid for the first 3 days and then prn for severe pain  . nitroGLYCERIN (NITROSTAT) 0.4 MG SL tablet Place 0.4 mg under the tongue every 5 (five) minutes as needed for chest pain.  Allergies  Allergen Reactions  . Cortisone Anaphylaxis    Some forms of cortisone  . Crestor [Rosuvastatin Calcium] Other (See Comments)    Memory loss for 3 months  . Demerol [Meperidine] Anaphylaxis  . Monosodium Glutamate Anaphylaxis  . Morphine And Related Anaphylaxis, Nausea And Vomiting and Other (See Comments)  . Prednisone Anaphylaxis    confusion  . Shellfish Allergy Anaphylaxis  . Sulfa Antibiotics Anaphylaxis  . Buprenorphine Hcl Nausea And Vomiting and  Hypertension    SHAKING  . Hctz [Hydrochlorothiazide] Other (See Comments)    Dizziness   . Readi-Cat [Barium Sulfate] Diarrhea    Patient does not want to drink Readi-Cat because it gives her diarrhea  . Xanax [Alprazolam] Other (See Comments)    "makes me crazy"  . Benzoic Acid Nausea Only    Social History   Socioeconomic History  . Marital status: Widowed    Spouse name: Not on file  . Number of children: 1  . Years of education: Not on file  . Highest education level: Not on file  Occupational History  . Occupation: retired  Tobacco Use  . Smoking status: Never Smoker  . Smokeless tobacco: Never Used  Vaping Use  . Vaping Use: Never used  Substance and Sexual Activity  . Alcohol use: Yes    Alcohol/week: 0.0 standard drinks    Comment: occasional   . Drug use: No  . Sexual activity: Not on file  Other Topics Concern  . Not on file  Social History Narrative  . Not on file   Social Determinants of Health   Financial Resource Strain:   . Difficulty of Paying Living Expenses: Not on file  Food Insecurity:   . Worried About Charity fundraiser in the Last Year: Not on file  . Ran Out of Food in the Last Year: Not on file  Transportation Needs:   . Lack of Transportation (Medical): Not on file  . Lack of Transportation (Non-Medical): Not on file  Physical Activity:   . Days of Exercise per Week: Not on file  . Minutes of Exercise per Session: Not on file  Stress:   . Feeling of Stress : Not on file  Social Connections:   . Frequency of Communication with Friends and Family: Not on file  . Frequency of Social Gatherings with Friends and Family: Not on file  . Attends Religious Services: Not on file  . Active Member of Clubs or Organizations: Not on file  . Attends Archivist Meetings: Not on file  . Marital Status: Not on file  Intimate Partner Violence:   . Fear of Current or Ex-Partner: Not on file  . Emotionally Abused: Not on file  .  Physically Abused: Not on file  . Sexually Abused: Not on file     Review of Systems: General: negative for chills, fever, night sweats or weight changes.  Cardiovascular: negative for chest pain, dyspnea on exertion, edema, orthopnea, palpitations, paroxysmal nocturnal dyspnea or shortness of breath Dermatological: negative for rash Respiratory: negative for cough or wheezing Urologic: negative for hematuria Abdominal: negative for nausea, vomiting, diarrhea, bright red blood per rectum, melena, or hematemesis Neurologic: negative for visual changes, syncope, or dizziness All other systems reviewed and are otherwise negative except as noted above.    Blood pressure (!) 162/96, pulse 81, height 5\' 5"  (1.651 m), weight 143 lb (64.9 kg).  General appearance: alert and no distress Neck: no adenopathy, no carotid bruit, no JVD, supple, symmetrical,  trachea midline and thyroid not enlarged, symmetric, no tenderness/mass/nodules Lungs: clear to auscultation bilaterally Heart: regular rate and rhythm, S1, S2 normal, no murmur, click, rub or gallop Extremities: extremities normal, atraumatic, no cyanosis or edema Pulses: 2+ and symmetric Skin: Skin color, texture, turgor normal. No rashes or lesions Neurologic: Alert and oriented X 3, normal strength and tone. Normal symmetric reflexes. Normal coordination and gait  EKG sinus rhythm at 78 without ST or T wave changes.  I personally reviewed this EKG.  ASSESSMENT AND PLAN:   Essential hypertension History of essential hypertension a blood pressure measured today at 152/96.  She is on carvedilol.  She was on losartan 50 mg a day when I saw her last and for some reason she is not on it today.  We will restart this.  Dyslipidemia, goal LDL below 70 History of hyperlipidemia intolerant to PCSK9 and statin drugs.  I did refer her to the lipid clinic for further evaluation  Status post insertion of drug-eluting stent into left anterior  descending (LAD) artery History of CAD status post cardiac catheterization by myself 05/30/2016 revealing 90% mid LAD stenosis which I stented with a synergy drug-eluting stent.  She was recath by Dr. Angelena Form 08/14/2017 revealing progression of disease in the proximal LAD which was restented.  That time her EF was 35 to 40%.  She was recath by Dr. Tamala Julian 11/28/2017 revealing patent stents with an EF that had normalized.  Subsequent Myoview performed 12/24/2017 was nonischemic.  Recently she has noticed some dyspnea, fatigue and back pain which is her "anginal equivalent.  I am going to recheck a 2D echo and a Lexiscan Myoview stress test.  Hypertension History of essential potential blood pressure measured today 162/96.  She is on carvedilol and was on losartan when I saw her 3 months ago but for some reason this is not on her list.  We will restart losartan 50 mg a day.      Lorretta Harp MD FACP,FACC,FAHA, [facility] 01/22/2020 3:04 PM

## 2020-01-22 NOTE — Assessment & Plan Note (Signed)
History of essential hypertension a blood pressure measured today at 152/96.  She is on carvedilol.  She was on losartan 50 mg a day when I saw her last and for some reason she is not on it today.  We will restart this.

## 2020-01-22 NOTE — Patient Instructions (Addendum)
Medication Instructions:   RESTART Losartan 50 mg daily  *If you need a refill on your cardiac medications before your next appointment, please call your pharmacy*  Lab Work: NONE ordered at this time of appointment   If you have labs (blood work) drawn today and your tests are completely normal, you will receive your results only by: Marland Kitchen MyChart Message (if you have MyChart) OR . A paper copy in the mail If you have any lab test that is abnormal or we need to change your treatment, we will call you to review the results.  Testing/Procedures: You have been referred to [facility] with Dr. Debara Pickett   Your physician has requested that you have an echocardiogram. Echocardiography is a painless test that uses sound waves to create images of your heart. It provides your doctor with information about the size and shape of your heart and how well your heart's chambers and valves are working. This procedure takes approximately one hour. There are no restrictions for this procedure.   PLEASE SCHEUDLE FOR THIS WEEK   Your physician has requested that you have a lexiscan myoview. For further information please visit [email or web address]. Please follow instruction sheet, as given.   PLEASE SCHEDULE FOR THIS WEEK   Follow-Up: At [facility], you and your health needs are our priority.  As part of our continuing mission to provide you with exceptional heart care, we have created designated Provider Care Teams.  These Care Teams include your primary Cardiologist (physician) and Advanced Practice Providers (APPs -  Physician Assistants and Nurse Practitioners) who all work together to provide you with the care you need, when you need it.  We recommend signing up for the patient portal called "MyChart".  Sign up information is provided on this After Visit Summary.  MyChart is used to connect with patients for Virtual Visits (Telemedicine).  Patients are able to view lab/test results, encounter  notes, upcoming appointments, etc.  Non-urgent messages can be sent to your provider as well.   To learn more about what you can do with MyChart, go to [email or web address].    Your next appointment:   3 month(s)  The format for your next appointment:   In Person  Provider:   Quay Burow, MD  Other Instructions

## 2020-01-22 NOTE — Assessment & Plan Note (Signed)
History of CAD status post cardiac catheterization by myself 05/30/2016 revealing 90% mid LAD stenosis which I stented with a synergy drug-eluting stent.  She was recath by Dr. Angelena Form 08/14/2017 revealing progression of disease in the proximal LAD which was restented.  That time her EF was 35 to 40%.  She was recath by Dr. Tamala Julian 11/28/2017 revealing patent stents with an EF that had normalized.  Subsequent Myoview performed 12/24/2017 was nonischemic.  Recently she has noticed some dyspnea, fatigue and back pain which is her "anginal equivalent.  I am going to recheck a 2D echo and a Lexiscan Myoview stress test.

## 2020-01-23 ENCOUNTER — Other Ambulatory Visit: Payer: Medicare Other

## 2020-01-23 ENCOUNTER — Other Ambulatory Visit: Payer: Self-pay | Admitting: Physician Assistant

## 2020-01-25 ENCOUNTER — Ambulatory Visit (HOSPITAL_COMMUNITY): Payer: Medicare Other | Attending: Cardiology

## 2020-01-25 ENCOUNTER — Other Ambulatory Visit: Payer: Self-pay

## 2020-01-25 DIAGNOSIS — R079 Chest pain, unspecified: Secondary | ICD-10-CM | POA: Diagnosis present

## 2020-01-25 DIAGNOSIS — R0602 Shortness of breath: Secondary | ICD-10-CM | POA: Diagnosis present

## 2020-01-25 LAB — ECHOCARDIOGRAM COMPLETE
Area-P 1/2: 3.12 cm2
S' Lateral: 2.2 cm

## 2020-01-30 ENCOUNTER — Telehealth: Payer: Self-pay

## 2020-01-30 MED ORDER — LOSARTAN POTASSIUM 50 MG PO TABS: 50.0000 mg | ORAL_TABLET | Freq: Every day | ORAL | 3 refills | Status: DC

## 2020-01-30 MED ORDER — CARVEDILOL 25 MG PO TABS
25.0000 mg | ORAL_TABLET | Freq: Two times a day (BID) | ORAL | 3 refills | Status: DC
Start: 2020-01-30 — End: 2023-05-22

## 2020-01-30 NOTE — Telephone Encounter (Signed)
Spoke to patient echo results given.She stated she has changed pharmacy's and needs Losartan and Coreg refills sent to [facility].She requested to be delivered.Refills sent to pharmacy.  She wanted to ask Dr.Berry if she needs to take Plavix 75 mg daily.Stated she is out.Advised I will send message to [facility] for advice.

## 2020-01-31 NOTE — Telephone Encounter (Signed)
Its been over 2 years since her coronary intervention.  Okay to discontinue Plavix and continue low-dose aspirin.

## 2020-01-31 NOTE — Addendum Note (Signed)
Addended by: Therisa Doyne on: 01/31/2020 04:24 PM   Modules accepted: Orders

## 2020-02-02 NOTE — Telephone Encounter (Signed)
Spoke to patient Dr.Berry's advice given.She will stop Plavix and take Aspirin 81 mg daily.

## 2020-02-12 ENCOUNTER — Inpatient Hospital Stay: Payer: Medicare Other

## 2020-02-12 ENCOUNTER — Inpatient Hospital Stay: Payer: Medicare Other | Admitting: Hematology & Oncology

## 2020-02-16 ENCOUNTER — Other Ambulatory Visit: Payer: Self-pay | Admitting: Internal Medicine

## 2020-02-16 ENCOUNTER — Other Ambulatory Visit: Payer: Self-pay | Admitting: Hematology & Oncology

## 2020-02-16 DIAGNOSIS — Z1231 Encounter for screening mammogram for malignant neoplasm of breast: Secondary | ICD-10-CM

## 2020-03-05 ENCOUNTER — Inpatient Hospital Stay: Payer: Medicare Other | Admitting: Family

## 2020-03-05 ENCOUNTER — Inpatient Hospital Stay: Payer: Medicare Other | Attending: Hematology & Oncology

## 2020-04-03 ENCOUNTER — Ambulatory Visit: Payer: Medicare Other

## 2020-04-08 ENCOUNTER — Ambulatory Visit: Payer: Medicare Other

## 2020-06-06 ENCOUNTER — Ambulatory Visit: Payer: Medicare Other | Admitting: Internal Medicine

## 2020-06-10 ENCOUNTER — Other Ambulatory Visit: Payer: Self-pay | Admitting: Pharmacist

## 2020-06-10 NOTE — Telephone Encounter (Signed)
Patient approved for Praluent but patient not willing to take anything else for cholesterol. Refused referral to Dr Debara Pickett as well.  Will remove from PCSK9i flow sheet

## 2020-06-18 ENCOUNTER — Encounter: Payer: Self-pay | Admitting: Internal Medicine

## 2020-07-30 ENCOUNTER — Telehealth: Payer: Self-pay

## 2020-07-30 NOTE — Telephone Encounter (Signed)
Pt called in to r/s her missed appt with Ronnie Doss

## 2020-09-09 ENCOUNTER — Inpatient Hospital Stay: Payer: Medicare Other

## 2020-09-09 ENCOUNTER — Telehealth: Payer: Self-pay

## 2020-09-09 ENCOUNTER — Telehealth: Payer: Self-pay | Admitting: *Deleted

## 2020-09-09 ENCOUNTER — Inpatient Hospital Stay: Payer: Medicare Other | Admitting: Family

## 2020-09-09 NOTE — Telephone Encounter (Signed)
Called pt per sch message to cancel and r/s

## 2020-09-09 NOTE — Telephone Encounter (Signed)
Recd a sch message to call pty and r/s todays appts as she is not feeling well.  Called pt and she did not have her calendar avail, she will call back when she feels better   Caitlyn [name]

## 2020-09-09 NOTE — Telephone Encounter (Signed)
Patient called to cancel appts today due to diarrhea.  Scheduling message sent to reschedule appts

## 2020-10-22 ENCOUNTER — Emergency Department (HOSPITAL_COMMUNITY)
Admission: EM | Admit: 2020-10-22 | Discharge: 2020-10-22 | Disposition: A | Discharge status: Home / Self Care | Payer: Medicare Other | Attending: Emergency Medicine | Admitting: Emergency Medicine

## 2020-10-22 ENCOUNTER — Encounter (HOSPITAL_COMMUNITY): Payer: Self-pay | Admitting: *Deleted

## 2020-10-22 DIAGNOSIS — I2510 Atherosclerotic heart disease of native coronary artery without angina pectoris: Secondary | ICD-10-CM | POA: Insufficient documentation

## 2020-10-22 DIAGNOSIS — E860 Dehydration: Secondary | ICD-10-CM | POA: Insufficient documentation

## 2020-10-22 DIAGNOSIS — B9689 Other specified bacterial agents as the cause of diseases classified elsewhere: Secondary | ICD-10-CM | POA: Insufficient documentation

## 2020-10-22 DIAGNOSIS — Z7982 Long term (current) use of aspirin: Secondary | ICD-10-CM | POA: Diagnosis not present

## 2020-10-22 DIAGNOSIS — Z955 Presence of coronary angioplasty implant and graft: Secondary | ICD-10-CM | POA: Insufficient documentation

## 2020-10-22 DIAGNOSIS — R531 Weakness: Secondary | ICD-10-CM | POA: Diagnosis not present

## 2020-10-22 DIAGNOSIS — N3090 Cystitis, unspecified without hematuria: Secondary | ICD-10-CM | POA: Insufficient documentation

## 2020-10-22 DIAGNOSIS — R42 Dizziness and giddiness: Secondary | ICD-10-CM

## 2020-10-22 DIAGNOSIS — I951 Orthostatic hypotension: Secondary | ICD-10-CM | POA: Diagnosis not present

## 2020-10-22 DIAGNOSIS — Z79899 Other long term (current) drug therapy: Secondary | ICD-10-CM | POA: Diagnosis not present

## 2020-10-22 DIAGNOSIS — R001 Bradycardia, unspecified: Secondary | ICD-10-CM | POA: Diagnosis not present

## 2020-10-22 DIAGNOSIS — I10 Essential (primary) hypertension: Secondary | ICD-10-CM | POA: Diagnosis not present

## 2020-10-22 LAB — URINALYSIS, ROUTINE W REFLEX MICROSCOPIC
Bilirubin Urine: NEGATIVE
Glucose, UA: NEGATIVE mg/dL
Ketones, ur: NEGATIVE mg/dL
Nitrite: NEGATIVE
Protein, ur: NEGATIVE mg/dL
Specific Gravity, Urine: 1.011 (ref 1.005–1.030)
pH: 7 (ref 5.0–8.0)

## 2020-10-22 LAB — BASIC METABOLIC PANEL
Anion gap: 9 (ref 5–15)
BUN: 23 mg/dL (ref 8–23)
CO2: 24 mmol/L (ref 22–32)
Calcium: 9.3 mg/dL (ref 8.9–10.3)
Chloride: 106 mmol/L (ref 98–111)
Creatinine, Ser: 0.9 mg/dL (ref 0.44–1.00)
GFR, Estimated: >60 mL/min (ref >60)
Glucose, Bld: 128 mg/dL — ABNORMAL HIGH (ref 70–99)
Potassium: 3.4 mmol/L — ABNORMAL LOW (ref 3.5–5.1)
Sodium: 139 mmol/L (ref 135–145)

## 2020-10-22 LAB — CBC
HCT: 34.1 % — ABNORMAL LOW (ref 36.0–46.0)
Hemoglobin: 10.8 g/dL — ABNORMAL LOW (ref 12.0–15.0)
MCH: 28 pg (ref 26.0–34.0)
MCHC: 31.7 g/dL (ref 30.0–36.0)
MCV: 88.3 fL (ref 80.0–100.0)
Platelets: 188 10*3/uL (ref 150–400)
RBC: 3.86 MIL/uL — ABNORMAL LOW (ref 3.87–5.11)
RDW: 14.3 % (ref 11.5–15.5)
WBC: 6.5 10*3/uL (ref 4.0–10.5)
nRBC: 0 % (ref 0.0–0.2)

## 2020-10-22 LAB — TROPONIN I (HIGH SENSITIVITY): Troponin I (High Sensitivity): 2 ng/L (ref <18)

## 2020-10-22 LAB — CBG MONITORING, ED: Glucose-Capillary: 129 mg/dL — ABNORMAL HIGH (ref 70–99)

## 2020-10-22 MED ORDER — LACTATED RINGERS IV BOLUS
1000.0000 mL | Freq: Once | INTRAVENOUS | Status: AC
  Administered 2020-10-22: 17:00:00 1000 mL via INTRAVENOUS

## 2020-10-22 MED ORDER — SODIUM CHLORIDE 0.9 % IV BOLUS
1000.0000 mL | Freq: Once | INTRAVENOUS | Status: AC
  Administered 2020-10-22: 14:00:00 1000 mL via INTRAVENOUS

## 2020-10-22 MED ORDER — SODIUM CHLORIDE 0.9 % IV SOLN
1.0000 g | Freq: Once | INTRAVENOUS | Status: AC
  Administered 2020-10-22: 1 g via INTRAVENOUS
  Filled 2020-10-22: qty 10

## 2020-10-22 MED ORDER — LIDOCAINE VISCOUS HCL 2 % MT SOLN
15.0000 mL | Freq: Once | OROMUCOSAL | Status: AC
  Administered 2020-10-22: 15 mL via ORAL
  Filled 2020-10-22: qty 15

## 2020-10-22 MED ORDER — CEPHALEXIN 500 MG PO CAPS: 500.0000 mg | ORAL_CAPSULE | Freq: Two times a day (BID) | ORAL | 0 refills | Status: AC

## 2020-10-22 MED ORDER — SODIUM CHLORIDE 0.9 % IV BOLUS: 1000.0000 mL | Freq: Once | INTRAVENOUS | Status: DC

## 2020-10-22 MED ORDER — ALUM & MAG HYDROXIDE-SIMETH 200-200-20 MG/5ML PO SUSP
30.0000 mL | Freq: Once | ORAL | Status: AC
  Administered 2020-10-22: 30 mL via ORAL
  Filled 2020-10-22: qty 30

## 2020-10-22 NOTE — ED Provider Notes (Signed)
[facility] DEPT Provider Note   CSN: [id number] Arrival date & time: 10/22/20  1154     History Chief Complaint  Patient presents with   Dizziness   Weakness    Caitlyn [name] is a 75 y.o. female.  HPI     75 year old female with history of hypertension, iron deficiency anemia comes in a chief complaint of weakness and near fainting.  Patient reports that on Friday she started feeling unwell.  She had some nausea, vomiting and diarrhea.  She suspect that the symptoms started after eating steak that he does.  Since then she has not eaten much.  She went to the bank today and nearly fainted.  She had nausea, diaphoresis and dizziness with her symptoms.  She has been having some exertional weakness.  No bloody stools.   Review of system later on confirms that she is also having some burning with urination and has had UTI in the past.  Past Medical History:  Diagnosis Date   Anemia    Arthritis    Bleeding diathesis ([place]) 02/22/2013   Closed right hip fracture ([place]) 05/28/2016   Coronary artery disease    E coli enteritis    GERD (gastroesophageal reflux disease)    H/O hiatal hernia    Head injury    Hypertension    Iron deficiency anemia, unspecified 02/22/2013   Mass of right kidney 08/22/2013   2.5 cm solid lesion seen on 5/14 CT   Myocardial infarction ([place])    Radicular pain of thoracic region 08/22/2013   Left T-8 started after pushing heavy chest 1/15; intermittent; no focal neuro deficit    Patient Active Problem List   Diagnosis Date Noted   Unstable angina ([place]) 11/28/2017   Ischemic cardiomyopathy 08/24/2017   Hypertension    Head injury    H/O hiatal hernia    GERD (gastroesophageal reflux disease)    E coli enteritis    Coronary artery disease    Arthritis    Anemia    GI bleeding 06/29/2016   Status post hip replacement, right 06/29/2016   Acute GI bleeding    History of cardiac cath    CAD S/P percutaneous coronary  angioplasty 06/07/2016   Postop check    Displaced intertrochanteric fracture of right femur, initial encounter for closed fracture ([place]) 06/03/2016   Status post insertion of drug-eluting stent into left anterior descending (LAD) artery    NSTEMI (non-ST elevated myocardial infarction) ([place])    Chest pain 05/30/2016   Hypothyroidism, acquired 05/28/2016   Closed fracture of right hip ([place]) 05/28/2016   Closed right hip fracture ([place]) 05/28/2016   Essential hypertension 10/29/2015   Dyslipidemia, goal LDL below 70 10/29/2015   Mass of right kidney 08/22/2013   Radicular pain of thoracic region 08/22/2013   Iron deficiency anemia 02/22/2013   Iron deficiency anemia, unspecified 02/22/2013   Bleeding diathesis ([place]) 02/22/2013    Past Surgical History:  Procedure Laterality Date   ABDOMINAL HYSTERECTOMY     BREAST BIOPSY     BREAST LUMPECTOMY Left    [place]   rt-nodes taken out-non cancer   [place]   rt lumpectomy   CARDIAC CATHETERIZATION N/A 06/01/2016   Procedure: LEFT HEART CATH;  Surgeon: Lorretta Harp, MD;  Location: [facility];  Service: Cardiovascular;  Laterality: N/A;   CARDIAC CATHETERIZATION N/A 06/01/2016   Procedure: Coronary Stent Intervention;  Surgeon:  Lorretta Harp, MD;  Location: [facility];  Service: Cardiovascular;  Laterality: N/A;   COLONOSCOPY     CORONARY STENT INTERVENTION N/A 08/14/2017   Procedure: CORONARY STENT INTERVENTION;  Surgeon: Burnell Blanks, MD;  Location: [facility];  Service: Cardiovascular;  Laterality: N/A;   FACIAL FRACTURE SURGERY  2000   FEMUR IM NAIL Right 06/03/2016   Procedure: INTRAMEDULLARY (IM) NAIL FEMORAL;  Surgeon: Rod Can, MD;  Location: [facility];  Service: Orthopedics;  Laterality: Right;   LEFT HEART CATH AND CORONARY ANGIOGRAPHY N/A 08/14/2017   Procedure: LEFT HEART CATH AND CORONARY ANGIOGRAPHY;  Surgeon: Burnell Blanks, MD;  Location: [facility];  Service: Cardiovascular;  Laterality: N/A;   LEFT HEART CATH AND CORONARY ANGIOGRAPHY N/A 11/29/2017   Procedure: LEFT HEART CATH AND CORONARY ANGIOGRAPHY;  Surgeon: Belva Crome, MD;  Location: [facility];  Service: Cardiovascular;  Laterality: N/A;   TONSILLECTOMY     TRIGGER FINGER RELEASE Right 11/28/2013   Procedure: RELEASE TRIGGER FINGER/A-1 PULLEY RIGHT LONG FINGER;  Surgeon: Cammie Sickle, MD;  Location: [facility];  Service: Orthopedics;  Laterality: Right;   URETHRA SURGERY     age 52     OB History   No obstetric history on file.     Family History  Problem Relation Age of Onset   Heart attack Father    Heart disease Mother    Alzheimer's disease Mother    Heart disease Brother    Colon cancer Maternal Grandfather    Stomach cancer Neg Hx    Rectal cancer Neg Hx    Esophageal cancer Neg Hx    Liver cancer Neg Hx     Social History   Tobacco Use   Smoking status: Never   Smokeless tobacco: Never  Vaping Use   Vaping Use: Never used  Substance Use Topics   Alcohol use: Yes    Alcohol/week: 0.0 standard drinks    Comment: occasional    Drug use: No    Home Medications Prior to Admission medications   Medication Sig Start Date End Date Taking? Authorizing Provider  ALBUTEROL SULFATE HFA IN Inhale 2 puffs into the lungs every 6 (six) hours as needed (shortness of breath/wheezing).    [provider]  aspirin 81 MG EC tablet Take 1 tablet (81 mg total) by mouth daily. 08/16/17   Leanor Kail, PA  carvedilol (COREG) 25 MG tablet Take 1 tablet (25 mg total) by mouth 2 (two) times daily with a meal. 01/30/20   Lorretta Harp, MD  Cholecalciferol (VITAMIN D PO) Take 3,000 Units by mouth daily.  07/17/19   [provider]  escitalopram (LEXAPRO) 10 MG tablet Take 10 mg by mouth daily. 08/28/19   [provider]  ibuprofen (ADVIL) 400 MG tablet Take 1 tablet  (400 mg total) by mouth every 8 (eight) hours as needed for moderate pain. Take 1 tablet tid for the first 3 days and then prn for severe pain 08/28/18   Varney Biles, MD  losartan (COZAAR) 50 MG tablet Take 1 tablet (50 mg total) by mouth daily. 01/30/20 04/29/20  Lorretta Harp, MD  nitroGLYCERIN (NITROSTAT) 0.4 MG SL tablet Place 0.4 mg under the tongue every 5 (five) minutes as needed for chest pain.    [provider]    Allergies    Cortisone, Crestor [rosuvastatin calcium], Demerol [meperidine], Monosodium glutamate, Morphine and related, Prednisone, Shellfish allergy,  Sulfa antibiotics, Buprenorphine hcl, Hctz [hydrochlorothiazide], Readi-cat [barium sulfate], Xanax [alprazolam], and Benzoic acid  Review of Systems   Review of Systems  Constitutional:  Positive for activity change and fatigue.  Respiratory:  Negative for shortness of breath.   Cardiovascular:  Negative for chest pain.  Gastrointestinal:  Negative for abdominal pain.  Genitourinary:  Positive for dysuria.  Neurological:  Positive for dizziness and syncope.  All other systems reviewed and are negative.  Physical Exam Updated Vital Signs BP (!) 169/72   Pulse (!) 59   Resp 18   SpO2 97%   Physical Exam Vitals and nursing note reviewed.  Constitutional:      Appearance: She is well-developed.  HENT:     Head: Atraumatic.  Eyes:     Extraocular Movements: Extraocular movements intact.     Pupils: Pupils are equal, round, and reactive to light.  Cardiovascular:     Rate and Rhythm: Normal rate.  Pulmonary:     Effort: Pulmonary effort is normal.  Musculoskeletal:     Cervical back: Normal range of motion and neck supple.  Skin:    General: Skin is warm and dry.  Neurological:     Mental Status: She is alert. She is disoriented.     Cranial Nerves: No cranial nerve deficit.     Sensory: No sensory deficit.     Motor: No weakness.     Coordination: Coordination normal.    ED Results /  Procedures / Treatments   Labs (all labs ordered are listed, but only abnormal results are displayed) Labs Reviewed  BASIC METABOLIC PANEL - Abnormal; Notable for the following components:      Result Value   Potassium 3.4 (*)    Glucose, Bld 128 (*)    All other components within normal limits  CBC - Abnormal; Notable for the following components:   RBC 3.86 (*)    Hemoglobin 10.8 (*)    HCT 34.1 (*)    All other components within normal limits  URINALYSIS, ROUTINE W REFLEX MICROSCOPIC - Abnormal; Notable for the following components:   APPearance CLOUDY (*)    Hgb urine dipstick SMALL (*)    Leukocytes,Ua LARGE (*)    Bacteria, UA RARE (*)    Non Squamous Epithelial 0-5 (*)    All other components within normal limits  CBG MONITORING, ED - Abnormal; Notable for the following components:   Glucose-Capillary 129 (*)    All other components within normal limits  TROPONIN I (HIGH SENSITIVITY)    EKG EKG Interpretation  Date/Time:  Tuesday October 22 2020 13:07:20 EDT Ventricular Rate:  57 PR Interval:  156 QRS Duration: 84 QT Interval:  454 QTC Calculation: 441 R Axis:   35 Text Interpretation: Sinus bradycardia Otherwise normal ECG Since last tracing rate slower Otherwise no significant change Confirmed by Daleen Bo [phone]) on 10/22/2020 2:53:13 PM  Radiology No results found.  Procedures Procedures   Medications Ordered in ED Medications  cefTRIAXone (ROCEPHIN) 1 g in sodium chloride 0.9 % 100 mL IVPB (has no administration in time range)  sodium chloride 0.9 % bolus 1,000 mL (1,000 mLs Intravenous New Bag/Given 10/22/20 1359)  lactated ringers bolus 1,000 mL (1,000 mLs Intravenous New Bag/Given 10/22/20 1701)  alum & mag hydroxide-simeth (MAALOX/MYLANTA) 200-200-20 MG/5ML suspension 30 mL (30 mLs Oral Given 10/22/20 1705)    And  lidocaine (XYLOCAINE) 2 % viscous mouth solution 15 mL (15 mLs Oral Given 10/22/20 1705)    ED Course  I  have reviewed the triage vital  signs and the nursing notes.  Pertinent labs & imaging results that were available during my care of the patient were reviewed by me and considered in my medical decision making (see chart for details).    MDM Rules/Calculators/A&P                          75 year old comes in a chief complaint of weakness, dizziness.  She has been having some nausea, vomiting and diarrhea into 3 days, now having weakness and dizziness.  Clinically, it appears that she has orthostatic hypotension and dizziness.  We will hydrate her.  Labs ordered to ensure there is no electrolyte abnormality, AKI.  Also will ensure there is no anemia.  Additionally, clinically she has UTI, antibiotics ordered for it.  5:45 PM IV ceftriaxone ordered now for what appears to be UTI.  Orthostatic positive, patient getting 2 L of fluid.  Final Clinical Impression(s) / ED Diagnoses Final diagnoses:  Dehydration  Orthostatic dizziness  Cystitis    Rx / DC Orders ED Discharge Orders     None        Varney Biles, MD 10/22/20 1745

## 2020-10-22 NOTE — ED Triage Notes (Signed)
Per EMS, pt called out for dizziness and weakness since yesterday morning. She has hx of requiring blood transfusions every month and felt like this is related.   BP 110/68 HR 64 RR 18 O2 99% CBG 182 20g l hand

## 2020-10-22 NOTE — ED Provider Notes (Signed)
Patient completed her antibiotics for UTI.  I will prescribe her 4 more days of Keflex.  She reported that she was feeling better and wanting to go home.  Rest of her work-up was reviewed.  Her vital signs were unremarkable.  She is afebrile.  No evidence of significant acute anemia, or other electrolyte derangement.  Her troponin level was 2 with > 6 hours of symptoms, doubt this is ACS.  Follow up with PCP advised.   Wyvonnia Dusky, MD 10/22/20 1911

## 2020-10-22 NOTE — Discharge Instructions (Addendum)
You were diagnosed with a urine infection today.  You were given IV antibiotics in the ER.  You will need to fill the prescription for Keflex and take 4 more days of antibiotic to complete the full course.  Your next dose is due tomorrow morning (10/23/20).   The rest of your blood work is reassuring.  There may be some component of dehydration.  Dizziness.  We gave you fluids here.  I advised that you take your time getting up and moving around the next couple days, and that you call and schedule follow-up appointment with your doctor tomorrow.

## 2020-10-22 NOTE — ED Notes (Signed)
ED Provider at bedside. 

## 2020-10-22 NOTE — ED Notes (Signed)
Code stroke canceled by MD

## 2020-10-24 LAB — URINE CULTURE: Culture: NO GROWTH

## 2021-02-18 DIAGNOSIS — Z23 Encounter for immunization: Secondary | ICD-10-CM | POA: Diagnosis not present

## 2021-02-18 DIAGNOSIS — R413 Other amnesia: Secondary | ICD-10-CM | POA: Diagnosis not present

## 2021-02-18 DIAGNOSIS — F322 Major depressive disorder, single episode, severe without psychotic features: Secondary | ICD-10-CM | POA: Diagnosis not present

## 2021-02-18 DIAGNOSIS — E559 Vitamin D deficiency, unspecified: Secondary | ICD-10-CM | POA: Diagnosis not present

## 2021-02-18 DIAGNOSIS — E785 Hyperlipidemia, unspecified: Secondary | ICD-10-CM | POA: Diagnosis not present

## 2021-02-18 DIAGNOSIS — Z0000 Encounter for general adult medical examination without abnormal findings: Secondary | ICD-10-CM | POA: Diagnosis not present

## 2021-02-18 DIAGNOSIS — Z1389 Encounter for screening for other disorder: Secondary | ICD-10-CM | POA: Diagnosis not present

## 2021-02-18 DIAGNOSIS — E039 Hypothyroidism, unspecified: Secondary | ICD-10-CM | POA: Diagnosis not present

## 2021-02-27 ENCOUNTER — Other Ambulatory Visit: Payer: Self-pay | Admitting: Internal Medicine

## 2021-02-27 DIAGNOSIS — Z1231 Encounter for screening mammogram for malignant neoplasm of breast: Secondary | ICD-10-CM

## 2021-04-02 DIAGNOSIS — D509 Iron deficiency anemia, unspecified: Secondary | ICD-10-CM | POA: Diagnosis not present

## 2021-04-15 ENCOUNTER — Other Ambulatory Visit: Payer: Self-pay | Admitting: Internal Medicine

## 2021-04-15 ENCOUNTER — Ambulatory Visit
Admission: RE | Admit: 2021-04-15 | Discharge: 2021-04-15 | Disposition: A | Discharge status: Home / Self Care | Payer: Medicare Other | Source: Ambulatory Visit | Attending: Internal Medicine | Admitting: Internal Medicine

## 2021-04-15 DIAGNOSIS — Z1231 Encounter for screening mammogram for malignant neoplasm of breast: Secondary | ICD-10-CM

## 2021-04-15 DIAGNOSIS — N63 Unspecified lump in unspecified breast: Secondary | ICD-10-CM

## 2021-04-15 DIAGNOSIS — N6311 Unspecified lump in the right breast, upper outer quadrant: Secondary | ICD-10-CM

## 2021-04-15 DIAGNOSIS — N6320 Unspecified lump in the left breast, unspecified quadrant: Secondary | ICD-10-CM

## 2021-04-17 ENCOUNTER — Emergency Department (HOSPITAL_COMMUNITY): Payer: Medicare Other

## 2021-04-17 ENCOUNTER — Other Ambulatory Visit: Payer: Self-pay

## 2021-04-17 ENCOUNTER — Encounter (HOSPITAL_COMMUNITY): Payer: Self-pay

## 2021-04-17 ENCOUNTER — Emergency Department (HOSPITAL_COMMUNITY)
Admission: EM | Admit: 2021-04-17 | Discharge: 2021-04-17 | Disposition: A | Discharge status: Home / Self Care | Payer: Medicare Other | Attending: Emergency Medicine | Admitting: Emergency Medicine

## 2021-04-17 DIAGNOSIS — E876 Hypokalemia: Secondary | ICD-10-CM | POA: Diagnosis not present

## 2021-04-17 DIAGNOSIS — R1110 Vomiting, unspecified: Secondary | ICD-10-CM | POA: Diagnosis not present

## 2021-04-17 DIAGNOSIS — R109 Unspecified abdominal pain: Secondary | ICD-10-CM | POA: Diagnosis not present

## 2021-04-17 DIAGNOSIS — Z7982 Long term (current) use of aspirin: Secondary | ICD-10-CM | POA: Insufficient documentation

## 2021-04-17 DIAGNOSIS — K5730 Diverticulosis of large intestine without perforation or abscess without bleeding: Secondary | ICD-10-CM | POA: Diagnosis not present

## 2021-04-17 DIAGNOSIS — R1084 Generalized abdominal pain: Secondary | ICD-10-CM | POA: Diagnosis not present

## 2021-04-17 DIAGNOSIS — K449 Diaphragmatic hernia without obstruction or gangrene: Secondary | ICD-10-CM | POA: Diagnosis not present

## 2021-04-17 DIAGNOSIS — R1011 Right upper quadrant pain: Secondary | ICD-10-CM | POA: Diagnosis present

## 2021-04-17 DIAGNOSIS — Z79899 Other long term (current) drug therapy: Secondary | ICD-10-CM | POA: Insufficient documentation

## 2021-04-17 DIAGNOSIS — R1030 Lower abdominal pain, unspecified: Secondary | ICD-10-CM | POA: Diagnosis not present

## 2021-04-17 DIAGNOSIS — E039 Hypothyroidism, unspecified: Secondary | ICD-10-CM | POA: Insufficient documentation

## 2021-04-17 DIAGNOSIS — I10 Essential (primary) hypertension: Secondary | ICD-10-CM | POA: Insufficient documentation

## 2021-04-17 DIAGNOSIS — I2510 Atherosclerotic heart disease of native coronary artery without angina pectoris: Secondary | ICD-10-CM | POA: Insufficient documentation

## 2021-04-17 DIAGNOSIS — R079 Chest pain, unspecified: Secondary | ICD-10-CM | POA: Diagnosis not present

## 2021-04-17 DIAGNOSIS — R1111 Vomiting without nausea: Secondary | ICD-10-CM | POA: Diagnosis not present

## 2021-04-17 LAB — URINALYSIS, ROUTINE W REFLEX MICROSCOPIC
Bilirubin Urine: NEGATIVE
Glucose, UA: NEGATIVE mg/dL
Hgb urine dipstick: NEGATIVE
Ketones, ur: NEGATIVE mg/dL
Leukocytes,Ua: NEGATIVE
Nitrite: NEGATIVE
Protein, ur: NEGATIVE mg/dL
Specific Gravity, Urine: 1.01 (ref 1.005–1.030)
pH: 7.5 (ref 5.0–8.0)

## 2021-04-17 LAB — COMPREHENSIVE METABOLIC PANEL
ALT: 9 U/L (ref 0–44)
AST: 21 U/L (ref 15–41)
Albumin: 4.2 g/dL (ref 3.5–5.0)
Alkaline Phosphatase: 70 U/L (ref 38–126)
Anion gap: 14 (ref 5–15)
BUN: 13 mg/dL (ref 8–23)
CO2: 22 mmol/L (ref 22–32)
Calcium: 9.7 mg/dL (ref 8.9–10.3)
Chloride: 106 mmol/L (ref 98–111)
Creatinine, Ser: 0.87 mg/dL (ref 0.44–1.00)
GFR, Estimated: >60 mL/min (ref >60)
Glucose, Bld: 126 mg/dL — ABNORMAL HIGH (ref 70–99)
Potassium: 3.2 mmol/L — ABNORMAL LOW (ref 3.5–5.1)
Sodium: 142 mmol/L (ref 135–145)
Total Bilirubin: 0.8 mg/dL (ref 0.3–1.2)
Total Protein: 7.3 g/dL (ref 6.5–8.1)

## 2021-04-17 LAB — CBC
HCT: 38.8 % (ref 36.0–46.0)
Hemoglobin: 11 g/dL — ABNORMAL LOW (ref 12.0–15.0)
MCH: 22.4 pg — ABNORMAL LOW (ref 26.0–34.0)
MCHC: 28.4 g/dL — ABNORMAL LOW (ref 30.0–36.0)
MCV: 79 fL — ABNORMAL LOW (ref 80.0–100.0)
Platelets: 317 10*3/uL (ref 150–400)
RBC: 4.91 MIL/uL (ref 3.87–5.11)
RDW: 16.3 % — ABNORMAL HIGH (ref 11.5–15.5)
WBC: 11.7 10*3/uL — ABNORMAL HIGH (ref 4.0–10.5)
nRBC: 0 % (ref 0.0–0.2)

## 2021-04-17 LAB — LIPASE, BLOOD: Lipase: 35 U/L (ref 11–51)

## 2021-04-17 LAB — TROPONIN I (HIGH SENSITIVITY): Troponin I (High Sensitivity): 8 ng/L (ref <18)

## 2021-04-17 MED ORDER — POTASSIUM CHLORIDE CRYS ER 20 MEQ PO TBCR
20.0000 meq | EXTENDED_RELEASE_TABLET | Freq: Once | ORAL | Status: AC
  Administered 2021-04-17: 20 meq via ORAL
  Filled 2021-04-17: qty 1

## 2021-04-17 MED ORDER — ONDANSETRON 4 MG PO TBDP: 4.0000 mg | ORAL_TABLET | Freq: Once | ORAL | Status: DC | PRN

## 2021-04-17 MED ORDER — IOHEXOL 300 MG/ML  SOLN
100.0000 mL | Freq: Once | INTRAMUSCULAR | Status: AC | PRN
  Administered 2021-04-17: 100 mL via INTRAVENOUS

## 2021-04-17 MED ORDER — LIDOCAINE VISCOUS HCL 2 % MT SOLN
15.0000 mL | Freq: Once | OROMUCOSAL | Status: AC
  Administered 2021-04-17: 15 mL via ORAL
  Filled 2021-04-17: qty 15

## 2021-04-17 MED ORDER — ALUM & MAG HYDROXIDE-SIMETH 200-200-20 MG/5ML PO SUSP
30.0000 mL | Freq: Once | ORAL | Status: AC
  Administered 2021-04-17: 30 mL via ORAL
  Filled 2021-04-17: qty 30

## 2021-04-17 NOTE — ED Provider Notes (Signed)
Emergency Medicine Provider Triage Evaluation Note  Caitlyn [name] , a 75 y.o. female  was evaluated in triage.  Pt complains of left lower quadrant abdominal pain that radiates into the left side of the abdomen for the last day.  Pain is sharp and intense.  Reports associated nausea and vomiting.  No fever, chills, urinary complaints.  Review of Systems  Positive:  Negative: See above   Physical Exam  BP (!) 184/124 (BP Location: Left Arm)   Pulse 89   Temp 98.3 F (36.8 C) (Oral)   Resp 14   SpO2 100%  Gen:   Awake, no distress   Resp:  Normal effort  MSK:   Moves extremities without difficulty  Other:  Of lower quadrant abdominal tenderness  Medical Decision Making  Medically screening exam initiated at 4:21 PM.  Appropriate orders placed.  Caitlyn [name] was informed that the remainder of the evaluation will be completed by another provider, this initial triage assessment does not replace that evaluation, and the importance of remaining in the ED until their evaluation is complete.     Caitlyn Bright [place], PA-C 04/17/21 1623    Caitlyn Rank, MD 04/18/21 [phone]

## 2021-04-17 NOTE — ED Provider Notes (Signed)
[facility] EMERGENCY DEPARTMENT Provider Note   CSN: [id number] Arrival date & time: 04/17/21  1543     History Chief Complaint  Patient presents with   Abdominal Pain    Caitlyn [name] is a 75 y.o. female.   Abdominal Pain Associated symptoms: nausea   Associated symptoms: no chest pain, no chills, no cough, no dysuria, no fever, no hematuria, no shortness of breath, no sore throat and no vomiting    75 year old female with medical history significant for CAD, GERD, HTN who presents to the emergency department with left upper quadrant abdominal pain.  The pain has been gradual in onset over the past 2 days.  She states that it radiates from her left to her epigastrium into her left she denies any fever or chills at home.  She endorses nausea  Pain is sharp, moderate to severe in intensity.  No aggravating or alleviating factors.  She has a history of hiatal hernia and CAD.  She denies any chest pain or shortness of breath.  She denies any diaphoresis.  She denies any cough.  No dysuria.  Past Medical History:  Diagnosis Date   Anemia    Arthritis    Bleeding diathesis ([place]) 02/22/2013   Closed right hip fracture ([place]) 05/28/2016   Coronary artery disease    E coli enteritis    GERD (gastroesophageal reflux disease)    H/O hiatal hernia    Head injury    Hypertension    Iron deficiency anemia, unspecified 02/22/2013   Mass of right kidney 08/22/2013   2.5 cm solid lesion seen on 5/14 CT   Myocardial infarction [facility])    Radicular pain of thoracic region 08/22/2013   Left T-8 started after pushing heavy chest 1/15; intermittent; no focal neuro deficit    Patient Active Problem List   Diagnosis Date Noted   Unstable angina ([place]) 11/28/2017   Ischemic cardiomyopathy 08/24/2017   Hypertension    Head injury    H/O hiatal hernia    GERD (gastroesophageal reflux disease)    E coli enteritis    Coronary artery disease    Arthritis    Anemia    GI bleeding  06/29/2016   Status post hip replacement, right 06/29/2016   Acute GI bleeding    History of cardiac cath    CAD S/P percutaneous coronary angioplasty 06/07/2016   Postop check    Displaced intertrochanteric fracture of right femur, initial encounter for closed fracture ([place]) 06/03/2016   Status post insertion of drug-eluting stent into left anterior descending (LAD) artery    NSTEMI (non-ST elevated myocardial infarction) ([place])    Chest pain 05/30/2016   Hypothyroidism, acquired 05/28/2016   Closed fracture of right hip ([place]) 05/28/2016   Closed right hip fracture ([place]) 05/28/2016   Essential hypertension 10/29/2015   Dyslipidemia, goal LDL below 70 10/29/2015   Mass of right kidney 08/22/2013   Radicular pain of thoracic region 08/22/2013   Iron deficiency anemia 02/22/2013   Iron deficiency anemia, unspecified 02/22/2013   Bleeding diathesis ([place]) 02/22/2013    Past Surgical History:  Procedure Laterality Date   ABDOMINAL HYSTERECTOMY     BREAST BIOPSY     BREAST LUMPECTOMY Left    [place]   rt-nodes taken out-non cancer   [place]   rt lumpectomy   CARDIAC CATHETERIZATION N/A 06/01/2016   Procedure: LEFT HEART CATH;  Surgeon: Lorretta Harp, MD;  Location: [facility];  Service: Cardiovascular;  Laterality: N/A;   CARDIAC CATHETERIZATION N/A 06/01/2016   Procedure: Coronary Stent Intervention;  Surgeon: Lorretta Harp, MD;  Location: [facility];  Service: Cardiovascular;  Laterality: N/A;   COLONOSCOPY     CORONARY STENT INTERVENTION N/A 08/14/2017   Procedure: CORONARY STENT INTERVENTION;  Surgeon: Burnell Blanks, MD;  Location: [facility];  Service: Cardiovascular;  Laterality: N/A;   FACIAL FRACTURE SURGERY  2000   FEMUR IM NAIL Right 06/03/2016   Procedure: INTRAMEDULLARY (IM) NAIL FEMORAL;  Surgeon: Rod Can, MD;  Location: [facility];  Service: Orthopedics;  Laterality:  Right;   LEFT HEART CATH AND CORONARY ANGIOGRAPHY N/A 08/14/2017   Procedure: LEFT HEART CATH AND CORONARY ANGIOGRAPHY;  Surgeon: Burnell Blanks, MD;  Location: [facility];  Service: Cardiovascular;  Laterality: N/A;   LEFT HEART CATH AND CORONARY ANGIOGRAPHY N/A 11/29/2017   Procedure: LEFT HEART CATH AND CORONARY ANGIOGRAPHY;  Surgeon: Belva Crome, MD;  Location: [facility];  Service: Cardiovascular;  Laterality: N/A;   REDUCTION MAMMAPLASTY Bilateral    years ago   TONSILLECTOMY     TRIGGER FINGER RELEASE Right 11/28/2013   Procedure: RELEASE TRIGGER FINGER/A-1 PULLEY RIGHT LONG FINGER;  Surgeon: Cammie Sickle, MD;  Location: [facility];  Service: Orthopedics;  Laterality: Right;   URETHRA SURGERY     age 55     OB History   No obstetric history on file.     Family History  Problem Relation Age of Onset   Heart attack Father    Heart disease Mother    Alzheimer's disease Mother    Heart disease Brother    Colon cancer Maternal Grandfather    Stomach cancer Neg Hx    Rectal cancer Neg Hx    Esophageal cancer Neg Hx    Liver cancer Neg Hx     Social History   Tobacco Use   Smoking status: Never   Smokeless tobacco: Never  Vaping Use   Vaping Use: Never used  Substance Use Topics   Alcohol use: Yes    Alcohol/week: 0.0 standard drinks    Comment: occasional    Drug use: No    Home Medications Prior to Admission medications   Medication Sig Start Date End Date Taking? Authorizing Provider  ALBUTEROL SULFATE HFA IN Inhale 2 puffs into the lungs every 6 (six) hours as needed (shortness of breath/wheezing).    [provider]  aspirin 81 MG EC tablet Take 1 tablet (81 mg total) by mouth daily. 08/16/17   Leanor Kail, PA  carvedilol (COREG) 25 MG tablet Take 1 tablet (25 mg total) by mouth 2 (two) times daily with a meal. 01/30/20   Lorretta Harp, MD  Cholecalciferol (VITAMIN D PO) Take 3,000 Units by  mouth daily.  07/17/19   [provider]  escitalopram (LEXAPRO) 10 MG tablet Take 10 mg by mouth daily. 08/28/19   [provider]  ibuprofen (ADVIL) 400 MG tablet Take 1 tablet (400 mg total) by mouth every 8 (eight) hours as needed for moderate pain. Take 1 tablet tid for the first 3 days and then prn for severe pain 08/28/18   Varney Biles, MD  losartan (COZAAR) 50 MG tablet Take 1 tablet (50 mg total) by mouth daily. 01/30/20 04/29/20  Lorretta Harp, MD  nitroGLYCERIN (NITROSTAT) 0.4 MG SL tablet Place 0.4 mg under the tongue every  5 (five) minutes as needed for chest pain.    [provider]    Allergies    Cortisone, Crestor [rosuvastatin calcium], Demerol [meperidine], Monosodium glutamate, Morphine and related, Prednisone, Shellfish allergy, Sulfa antibiotics, Buprenorphine hcl, Hctz [hydrochlorothiazide], Readi-cat [barium sulfate], Xanax [alprazolam], Rosuvastatin, and Benzoic acid  Review of Systems   Review of Systems  Constitutional:  Negative for chills and fever.  HENT:  Negative for ear pain and sore throat.   Eyes:  Negative for pain and visual disturbance.  Respiratory:  Negative for cough and shortness of breath.   Cardiovascular:  Negative for chest pain and palpitations.  Gastrointestinal:  Positive for abdominal pain and nausea. Negative for vomiting.  Genitourinary:  Negative for dysuria and hematuria.  Musculoskeletal:  Negative for arthralgias and back pain.  Skin:  Negative for color change and rash.  Neurological:  Negative for seizures and syncope.  All other systems reviewed and are negative.  Physical Exam Updated Vital Signs BP (!) 155/78 (BP Location: Right Arm)   Pulse 89   Temp 97.9 F (36.6 C)   Resp 17   SpO2 98%   Physical Exam Vitals and nursing note reviewed.  Constitutional:      General: She is not in acute distress.    Appearance: She is well-developed.  HENT:     Head: Normocephalic and atraumatic.  Eyes:      Conjunctiva/sclera: Conjunctivae normal.     Pupils: Pupils are equal, round, and reactive to light.  Cardiovascular:     Rate and Rhythm: Normal rate and regular rhythm.     Heart sounds: No murmur heard. Pulmonary:     Effort: Pulmonary effort is normal. No respiratory distress.     Breath sounds: Normal breath sounds.  Abdominal:     General: There is no distension.     Palpations: Abdomen is soft.     Tenderness: There is abdominal tenderness in the left upper quadrant. There is no right CVA tenderness, left CVA tenderness, guarding or rebound.  Musculoskeletal:        General: No swelling, deformity or signs of injury.     Cervical back: Neck supple.  Skin:    General: Skin is warm and dry.     Capillary Refill: Capillary refill takes less than 2 seconds.     Findings: No lesion or rash.  Neurological:     General: No focal deficit present.     Mental Status: She is alert. Mental status is at baseline.  Psychiatric:        Mood and Affect: Mood normal.    ED Results / Procedures / Treatments   Labs (all labs ordered are listed, but only abnormal results are displayed) Labs Reviewed  COMPREHENSIVE METABOLIC PANEL - Abnormal; Notable for the following components:      Result Value   Potassium 3.2 (*)    Glucose, Bld 126 (*)    All other components within normal limits  CBC - Abnormal; Notable for the following components:   WBC 11.7 (*)    Hemoglobin 11.0 (*)    MCV 79.0 (*)    MCH 22.4 (*)    MCHC 28.4 (*)    RDW 16.3 (*)    All other components within normal limits  LIPASE, BLOOD  URINALYSIS, ROUTINE W REFLEX MICROSCOPIC  TROPONIN I (HIGH SENSITIVITY)  TROPONIN I (HIGH SENSITIVITY)    EKG EKG Interpretation  Date/Time:  Thursday April 17 2021 17:44:40 EST Ventricular Rate:  91 PR Interval:  142 QRS Duration: 72 QT Interval:  378 QTC Calculation: 464 R Axis:   -8 Text Interpretation: Sinus rhythm with occasional Premature ventricular complexes  Nonspecific ST abnormality Abnormal ECG Confirmed by Regan Lemming ([id number]) on 04/17/2021 11:03:38 PM  Radiology DG Chest 2 View  Result Date: 04/17/2021 CLINICAL DATA:  Chest pain EXAM: CHEST - 2 VIEW COMPARISON:  01/16/2020 FINDINGS: No focal airspace disease or effusion. Stable cardiomediastinal silhouette with moderate to large hiatal hernia. Aortic atherosclerosis. No pneumothorax. IMPRESSION: No active cardiopulmonary disease.  Large hiatal hernia Electronically Signed   By: Donavan Foil M.D.   On: 04/17/2021 18:00   CT ABDOMEN PELVIS W CONTRAST  Result Date: 04/17/2021 CLINICAL DATA:  Abdominal pain and vomiting, initial encounter EXAM: CT ABDOMEN AND PELVIS WITH CONTRAST TECHNIQUE: Multidetector CT imaging of the abdomen and pelvis was performed using the standard protocol following bolus administration of intravenous contrast. CONTRAST:  135mL OMNIPAQUE IOHEXOL 300 MG/ML  SOLN COMPARISON:  11/27/2015 FINDINGS: Lower chest: Lung bases are free of acute infiltrate or sizable effusion. Large hiatal hernia is noted. Hepatobiliary: Fatty infiltration of the liver is noted. Gallbladder is within normal limits. Pancreas: Unremarkable. No pancreatic ductal dilatation or surrounding inflammatory changes. Spleen: Normal in size without focal abnormality. Adrenals/Urinary Tract: Adrenal glands are unremarkable. Kidneys demonstrate a normal enhancement pattern. No renal calculi or urinary tract obstructive changes are noted. Normal excretion is noted on delayed images. The bladder is partially distended. Stomach/Bowel: Diffuse diverticular change of the colon is noted. No obstructive or inflammatory changes are seen. The appendix is within normal limits. No inflammatory changes are noted. Small bowel and stomach are unremarkable aside from the hiatal hernia. Vascular/Lymphatic: Aortic atherosclerosis. No enlarged abdominal or pelvic lymph nodes. Reproductive: Status post hysterectomy. No adnexal masses. Other:  No abdominal wall hernia or abnormality. No abdominopelvic ascites. Musculoskeletal: Right hip fixation is noted. Degenerative changes of the lumbar spine are seen. No acute rib abnormality is noted. IMPRESSION: Large hiatal hernia. Diverticulosis without diverticulitis. Fatty infiltration of the liver. Electronically Signed   By: Inez Catalina M.D.   On: 04/17/2021 21:51    Procedures Procedures   Medications Ordered in ED Medications  iohexol (OMNIPAQUE) 300 MG/ML solution 100 mL (100 mLs Intravenous Contrast Given 04/17/21 2136)  potassium chloride SA (KLOR-CON M) CR tablet 20 mEq (20 mEq Oral Given 04/17/21 2336)  alum & mag hydroxide-simeth (MAALOX/MYLANTA) 200-200-20 MG/5ML suspension 30 mL (30 mLs Oral Given 04/17/21 2336)    And  lidocaine (XYLOCAINE) 2 % viscous mouth solution 15 mL (15 mLs Oral Given 04/17/21 2336)    ED Course  I have reviewed the triage vital signs and the nursing notes.  Pertinent labs & imaging results that were available during my care of the patient were reviewed by me and considered in my medical decision making (see chart for details).    MDM Rules/Calculators/A&P                           75 year old female with medical history significant for CAD, GERD, HTN who presents to the emergency department with left upper quadrant abdominal pain.  The pain has been gradual in onset over the past 2 days.  She states that it radiates from her left to her epigastrium into her left she denies any fever or chills at home.  She endorses nausea  Pain is sharp, moderate to severe in intensity.  No aggravating or alleviating factors.  She has a history  of hiatal hernia and CAD.  She denies any chest pain or shortness of breath.  She denies any diaphoresis.  She denies any cough.  No dysuria.  Pertinent exam findings include: Left upper quadrant tenderness to palpation, no rebound or guarding, no CVA tenderness, no signs of peritonitis, no tenderness in the lower quadrants of  the abdomen.  EKG: Sinus rhythm, ventricular rate 91, PVCs present, nonspecific ST segment changes present.  No STEMI.  Lab results include: Troponins x2 negative, lipase normal, urinalysis without evidence of UTI, CBC with a mild cytosis to 11.7, will, CMP with mild hypokalemia to 3.2.  Imaging results include: Chest x-ray without acute cardiopulmonary abnormality, large hiatal hernia present. CT abdomen pelvis with a large hiatal hernia, diverticulosis without diverticulitis, fatty liver.  Course of tx has consisted of: Potassium tablet, Maalox and viscous lidocaine.  Thought process: Patient presenting with left upper quadrant abdominal pain with associated nausea in the setting of a known left hiatal hernia.  She also has a history of CAD.  Her EKG does not appear significantly changed compared to prior.  With presenting history and physical exam, doubt Bowel Obstruction, AAA, ACS, pneumonia, pneumothorax, Pyelonephritis, Nephrolithiasis, Pancreatitis, Cholecystitis, Shingles, Perforated Bowel or Ulcer, Diverticulosis/itis, Ischemic Mesentery, Inflammatory Bowel Disease, Strangulated/Incarcerated Hernia, gastritis, PUD. Patient without peritoneal signs or other indication of need for surgical intervention.   EKG with nonspecific ST segment changes present.  She is afebrile, hemodynamically stable, mildly hypertensive.  She denies any chest pain at this time and feels that her pain in her left upper quadrant is different than her prior episodes of chest pain.  She has negative delta troponins x2, normal lipase and otherwise reassuring laboratory work-up with just a mild leukocytosis present.  Her pain is in the vicinity of her known hiatal hernia.  She is symptomatically improved following Maalox and viscous lidocaine and overall pain-free on reassessment.    Presentation more consistent with abdominal pain associated with a hiatal hernia.    Final Clinical Impression(s) / ED Diagnoses Final  diagnoses:  Hiatal hernia    Rx / DC Orders ED Discharge Orders     None        Regan Lemming, MD 04/18/21 1534

## 2021-04-17 NOTE — ED Triage Notes (Signed)
Pt BIBA via [facility] for abdominal pain that's started today preceeding vomiting that's been ongoing for the past 2 days. Pt stating the pain started on left side of abdomen, but now it radiates across abdomen. Per medic, patient stated low grade fever at home. Vitals: 144/78, HR: 90, SpO2: 98% (RA), CBG: 138

## 2021-04-17 NOTE — Discharge Instructions (Addendum)
You were evaluated in the Emergency Department and after careful evaluation, we did not find any emergent condition requiring admission or further testing in the hospital.  Your exam/testing today was overall reassuring.  CT abdomen pelvis revealed a hiatal hernia which is likely the etiology of your discomfort today.  Work-up did not reveal evidence of urinary tract infection or kidney infection with no evidence of stones in your kidney.  Please return to the Emergency Department if you experience any worsening of your condition.  Thank you for allowing [place] to be a part of your care.

## 2021-04-18 LAB — TROPONIN I (HIGH SENSITIVITY): Troponin I (High Sensitivity): 7 ng/L (ref <18)

## 2021-05-06 ENCOUNTER — Other Ambulatory Visit: Payer: Medicare Other

## 2023-03-15 ENCOUNTER — Encounter (HOSPITAL_COMMUNITY): Payer: Self-pay

## 2023-05-21 ENCOUNTER — Emergency Department (HOSPITAL_COMMUNITY): Payer: Medicare Other

## 2023-05-21 ENCOUNTER — Other Ambulatory Visit: Payer: Self-pay

## 2023-05-21 ENCOUNTER — Observation Stay (HOSPITAL_COMMUNITY)
Admission: EM | Admit: 2023-05-21 | Discharge: 2023-05-22 | Disposition: A | Discharge status: Home / Self Care | Payer: Medicare Other | Attending: Family Medicine | Admitting: Family Medicine

## 2023-05-21 ENCOUNTER — Encounter (HOSPITAL_COMMUNITY): Payer: Self-pay | Admitting: Emergency Medicine

## 2023-05-21 DIAGNOSIS — R413 Other amnesia: Secondary | ICD-10-CM | POA: Insufficient documentation

## 2023-05-21 DIAGNOSIS — R0902 Hypoxemia: Secondary | ICD-10-CM | POA: Diagnosis not present

## 2023-05-21 DIAGNOSIS — R079 Chest pain, unspecified: Principal | ICD-10-CM

## 2023-05-21 DIAGNOSIS — R1084 Generalized abdominal pain: Secondary | ICD-10-CM | POA: Diagnosis not present

## 2023-05-21 DIAGNOSIS — Z955 Presence of coronary angioplasty implant and graft: Secondary | ICD-10-CM | POA: Insufficient documentation

## 2023-05-21 DIAGNOSIS — I2510 Atherosclerotic heart disease of native coronary artery without angina pectoris: Secondary | ICD-10-CM | POA: Insufficient documentation

## 2023-05-21 DIAGNOSIS — K449 Diaphragmatic hernia without obstruction or gangrene: Secondary | ICD-10-CM | POA: Diagnosis not present

## 2023-05-21 DIAGNOSIS — I252 Old myocardial infarction: Secondary | ICD-10-CM

## 2023-05-21 DIAGNOSIS — R2681 Unsteadiness on feet: Secondary | ICD-10-CM | POA: Diagnosis not present

## 2023-05-21 DIAGNOSIS — K5730 Diverticulosis of large intestine without perforation or abscess without bleeding: Secondary | ICD-10-CM | POA: Diagnosis not present

## 2023-05-21 DIAGNOSIS — D649 Anemia, unspecified: Secondary | ICD-10-CM | POA: Diagnosis present

## 2023-05-21 DIAGNOSIS — R1013 Epigastric pain: Principal | ICD-10-CM

## 2023-05-21 DIAGNOSIS — Z79899 Other long term (current) drug therapy: Secondary | ICD-10-CM | POA: Diagnosis not present

## 2023-05-21 DIAGNOSIS — D509 Iron deficiency anemia, unspecified: Secondary | ICD-10-CM | POA: Diagnosis not present

## 2023-05-21 DIAGNOSIS — Z7982 Long term (current) use of aspirin: Secondary | ICD-10-CM | POA: Diagnosis not present

## 2023-05-21 DIAGNOSIS — I10 Essential (primary) hypertension: Secondary | ICD-10-CM | POA: Diagnosis present

## 2023-05-21 DIAGNOSIS — I2699 Other pulmonary embolism without acute cor pulmonale: Secondary | ICD-10-CM | POA: Insufficient documentation

## 2023-05-21 DIAGNOSIS — I700 Atherosclerosis of aorta: Secondary | ICD-10-CM | POA: Diagnosis not present

## 2023-05-21 LAB — BASIC METABOLIC PANEL
Anion gap: 15 (ref 5–15)
BUN: 22 mg/dL (ref 8–23)
CO2: 16 mmol/L — ABNORMAL LOW (ref 22–32)
Calcium: 9.6 mg/dL (ref 8.9–10.3)
Chloride: 104 mmol/L (ref 98–111)
Creatinine, Ser: 1.02 mg/dL — ABNORMAL HIGH (ref 0.44–1.00)
GFR, Estimated: 56 mL/min — ABNORMAL LOW (ref >60)
Glucose, Bld: 101 mg/dL — ABNORMAL HIGH (ref 70–99)
Potassium: 3.5 mmol/L (ref 3.5–5.1)
Sodium: 135 mmol/L (ref 135–145)

## 2023-05-21 LAB — HEPATIC FUNCTION PANEL
ALT: 12 U/L (ref 0–44)
AST: 22 U/L (ref 15–41)
Albumin: 4.2 g/dL (ref 3.5–5.0)
Alkaline Phosphatase: 75 U/L (ref 38–126)
Bilirubin, Direct: 0.1 mg/dL (ref 0.0–0.2)
Indirect Bilirubin: 0.9 mg/dL (ref 0.3–0.9)
Total Bilirubin: 1 mg/dL (ref 0.0–1.2)
Total Protein: 7.6 g/dL (ref 6.5–8.1)

## 2023-05-21 LAB — CBC
HCT: 35.1 % — ABNORMAL LOW (ref 36.0–46.0)
Hemoglobin: 9.9 g/dL — ABNORMAL LOW (ref 12.0–15.0)
MCH: 20.1 pg — ABNORMAL LOW (ref 26.0–34.0)
MCHC: 28.2 g/dL — ABNORMAL LOW (ref 30.0–36.0)
MCV: 71.3 fL — ABNORMAL LOW (ref 80.0–100.0)
Platelets: 428 10*3/uL — ABNORMAL HIGH (ref 150–400)
RBC: 4.92 MIL/uL (ref 3.87–5.11)
RDW: 18.2 % — ABNORMAL HIGH (ref 11.5–15.5)
WBC: 11.3 10*3/uL — ABNORMAL HIGH (ref 4.0–10.5)
nRBC: 0 % (ref 0.0–0.2)

## 2023-05-21 LAB — LIPASE, BLOOD: Lipase: 43 U/L (ref 11–51)

## 2023-05-21 LAB — TROPONIN I (HIGH SENSITIVITY)
Troponin I (High Sensitivity): 10 ng/L (ref <18)
Troponin I (High Sensitivity): 18 ng/L — ABNORMAL HIGH (ref <18)

## 2023-05-21 MED ORDER — ALUM & MAG HYDROXIDE-SIMETH 200-200-20 MG/5ML PO SUSP
30.0000 mL | Freq: Once | ORAL | Status: AC
  Administered 2023-05-21: 30 mL via ORAL
  Filled 2023-05-21: qty 30

## 2023-05-21 MED ORDER — DICYCLOMINE HCL 10 MG PO CAPS
10.0000 mg | ORAL_CAPSULE | Freq: Once | ORAL | Status: AC
  Administered 2023-05-21: 10 mg via ORAL
  Filled 2023-05-21: qty 1

## 2023-05-21 MED ORDER — IOHEXOL 350 MG/ML SOLN
100.0000 mL | Freq: Once | INTRAVENOUS | Status: AC | PRN
  Administered 2023-05-21: 80 mL via INTRAVENOUS

## 2023-05-21 MED ORDER — SUCRALFATE 1 G PO TABS
1.0000 g | ORAL_TABLET | Freq: Once | ORAL | Status: AC
  Administered 2023-05-21: 1 g via ORAL
  Filled 2023-05-21: qty 1

## 2023-05-21 NOTE — ED Notes (Signed)
 RN at bedside to start IV. Patient will not stop moving arm. RN asked patient if she could stop moving. Patient responded, Well if you guys knew how to do your job it wouldn't be like this. RN informed patient that she needed to treat staff with respect and not yell at them. Patient responded, Well do your damn job

## 2023-05-21 NOTE — Hospital Course (Addendum)
 Caitlyn [name] is a 78 y.o.female with a history of MI, HTN, IDA, GERD, and anemia who was admitted to the [facility] Medicine Teaching Service at [facility] for epigastric pain. Her hospital course is detailed below:  - MI with LAD stenting in the past - Cath 78 yo - proximal and mid LAD open - epigastric discomfort after eating, felt like her heart probs previously - large hiatal hernia  - improvement after GI cocktail -- pain free now - likely more GI - Troponin 10 > 18 (recheck trop)  Other chronic conditions were medically managed with home medications and formulary alternatives as necessary (***)  PCP Follow-up Recommendations: Consider [facility] outpatient with cardiology

## 2023-05-21 NOTE — ED Notes (Signed)
 This NT went into pts room to hook pt back up to bp cuff because pt is refusing to keep it on. Pt wont keep arm still to get an accurate reading. Informed pt why she needed to keep arm still and pt refused. RN notified.

## 2023-05-21 NOTE — ED Provider Notes (Signed)
 [place] EMERGENCY DEPARTMENT AT [facility] Provider Note   CSN: [id number] Arrival date & time: 05/21/23  1906     History  Chief Complaint  Patient presents with   Chest Pain    Chest pain that started in the last few hours with nausea and abd cramping. Pt states has had MI in the past x 5.     Caitlyn [name] is a 78 y.o. female.  78 year old female presents with epigastric pain which began yesterday morning.  States that she does have history of MI in the past and she is concerned that this feels similar.  She has had some increased shortness of breath but denies any severe dyspnea on exertion.  No associated diaphoresis.  Pain characterized as dull and radiating to her epigastrium.  She denies any fever cough or congestion.  Comfort does not get worse with exertion.  States that began after she had breakfast yesterday.  No medications used for this prior to arrival.      Home Medications Prior to Admission medications   Medication Sig Start Date End Date Taking? Authorizing Provider  ALBUTEROL  SULFATE HFA IN Inhale 2 puffs into the lungs every 6 (six) hours as needed (shortness of breath/wheezing).    [provider]  aspirin  81 MG EC tablet Take 1 tablet (81 mg total) by mouth daily. 08/16/17   Jerrie Anger, PA  carvedilol  (COREG ) 25 MG tablet Take 1 tablet (25 mg total) by mouth 2 (two) times daily with a meal. 01/30/20   Court Dorn PARAS, MD  Cholecalciferol (VITAMIN D PO) Take 3,000 Units by mouth daily.  07/17/19   [provider]  escitalopram (LEXAPRO) 10 MG tablet Take 10 mg by mouth daily. 08/28/19   [provider]  ibuprofen  (ADVIL ) 400 MG tablet Take 1 tablet (400 mg total) by mouth every 8 (eight) hours as needed for moderate pain. Take 1 tablet tid for the first 3 days and then prn for severe pain 08/28/18   Charlyn Sora, MD  losartan  (COZAAR ) 50 MG tablet Take 1 tablet (50 mg total) by mouth daily. 01/30/20 04/29/20  Court Dorn PARAS, MD  nitroGLYCERIN  (NITROSTAT ) 0.4 MG SL tablet Place 0.4 mg under the tongue every 5 (five) minutes as needed for chest pain.    [provider]      Allergies    Cortisone, Crestor  [rosuvastatin  calcium ], Demerol [meperidine], Monosodium glutamate, Morphine and codeine, Prednisone, Shellfish allergy, Sulfa antibiotics, Buprenorphine hcl, Hctz [hydrochlorothiazide ], Readi-cat [barium sulfate], Xanax [alprazolam], Rosuvastatin , and Benzoic acid    Review of Systems   Review of Systems  All other systems reviewed and are negative.   Physical Exam Updated Vital Signs BP (!) 170/53   Pulse 93   Temp 98.1 F (36.7 C) (Oral)   Resp 20   Ht 1.651 m (5' 5)   Wt 52.2 kg   SpO2 100%   BMI 19.14 kg/m  Physical Exam Vitals and nursing note reviewed.  Constitutional:      General: She is not in acute distress.    Appearance: Normal appearance. She is well-developed. She is not toxic-appearing.  HENT:     Head: Normocephalic and atraumatic.  Eyes:     General: Lids are normal.     Conjunctiva/sclera: Conjunctivae normal.     Pupils: Pupils are equal, round, and reactive to light.  Neck:     Thyroid : No thyroid  mass.     Trachea: No tracheal deviation.  Cardiovascular:  Rate and Rhythm: Normal rate and regular rhythm.     Heart sounds: Normal heart sounds. No murmur heard.    No gallop.  Pulmonary:     Effort: Pulmonary effort is normal. No respiratory distress.     Breath sounds: Normal breath sounds. No stridor. No decreased breath sounds, wheezing, rhonchi or rales.  Abdominal:     General: There is no distension.     Palpations: Abdomen is soft.     Tenderness: There is abdominal tenderness in the epigastric area. There is guarding. There is no rebound.    Musculoskeletal:        General: No tenderness. Normal range of motion.     Cervical back: Normal range of motion and neck supple.  Skin:    General: Skin is warm and dry.     Findings: No  abrasion or rash.  Neurological:     Mental Status: She is alert and oriented to person, place, and time. Mental status is at baseline.     GCS: GCS eye subscore is 4. GCS verbal subscore is 5. GCS motor subscore is 6.     Cranial Nerves: No cranial nerve deficit.     Sensory: No sensory deficit.     Motor: Motor function is intact.  Psychiatric:        Attention and Perception: Attention normal.        Speech: Speech normal.        Behavior: Behavior normal.    ED Results / Procedures / Treatments   Labs (all labs ordered are listed, but only abnormal results are displayed) Labs Reviewed  CBC - Abnormal; Notable for the following components:      Result Value   WBC 11.3 (*)    Hemoglobin 9.9 (*)    HCT 35.1 (*)    MCV 71.3 (*)    MCH 20.1 (*)    MCHC 28.2 (*)    RDW 18.2 (*)    Platelets 428 (*)    All other components within normal limits  BASIC METABOLIC PANEL  TROPONIN I (HIGH SENSITIVITY)    EKG EKG Interpretation Date/Time:  Friday May 21 2023 19:11:00 EST Ventricular Rate:  89 PR Interval:    QRS Duration:  68 QT Interval:  366 QTC Calculation: 445 R Axis:   47  Text Interpretation: Accelerated Junctional rhythm Abnormal ECG When compared with ECG of 17-Apr-2021 17:44, PREVIOUS ECG IS PRESENT Confirmed by Dasie Faden ([id number]) on 05/21/2023 7:55:41 PM  Radiology DG Chest 2 View Result Date: 05/21/2023 CLINICAL DATA:  Chest pain EXAM: CHEST - 2 VIEW COMPARISON:  04/17/2021 FINDINGS: The heart size and mediastinal contours are within normal limits. Aortic atherosclerosis. Both lungs are clear. The visualized skeletal structures are unremarkable. Moderate to large hiatal hernia IMPRESSION: No active cardiopulmonary disease. Moderate to large hiatal hernia. Electronically Signed   By: Luke Bun M.D.   On: 05/21/2023 19:50    Procedures Procedures    Medications Ordered in ED Medications  alum & mag hydroxide-simeth (MAALOX/MYLANTA) 200-200-20 MG/5ML  suspension 30 mL (has no administration in time range)  dicyclomine  (BENTYL ) 10 MG/5ML solution 10 mg (has no administration in time range)  sucralfate  (CARAFATE ) tablet 1 g (has no administration in time range)    ED Course/ Medical Decision Making/ A&P                                 Medical Decision  Making Amount and/or Complexity of Data Reviewed Labs: ordered. Radiology: ordered.  Risk OTC drugs. Prescription drug management.   Patient is EKG per my interpretation shows normal sinus rhythm.  Patient's troponins are negative.  Patient had dissection study which did not show any evidence of dissection.  Troponins did increase.  I have consulted hospitalist for admission          Final Clinical Impression(s) / ED Diagnoses Final diagnoses:  None    Rx / DC Orders ED Discharge Orders     None         Dasie Faden, MD 05/28/23 2337

## 2023-05-21 NOTE — ED Notes (Signed)
 Pt pressed call light and this NT responded to the call. Pt stated "I took this blood pressure cuff off because you guys are making me absolutely miserable."  RN notified.

## 2023-05-21 NOTE — H&P (Addendum)
 Hospital Admission History and Physical Service Pager: [phone]  Patient name: Caitlyn [name] Medical record number: [id number] Date of Birth: 1945/08/10 Age: 78 y.o. Gender: female  Primary Care Provider: Pcp, No Consultants: None Code Status: Full code Preferred Emergency Contact:  Contact Information     Name Relation Home Work Mobile   [place]   [phone]      Other Contacts   None on File    Chief Complaint: chest/epigastric pain  Assessment and Plan: Caitlyn [name] is a 78 y.o. female with past medical hx of MI, HTN, HLD, GERD, and IDA presenting with epigastric pain. Differential for presentation of this includes:  Hiatal hernia - large hiatal hernia seen on imaging which could be the source of her pain since discomfort began after eating. GERD - Likely d/t pain associated with eating and improvement s/p GI cocktail in ED MI - Trop increased from 10 to 18. Hx of MI in the past and L heart cath and stent placement in 2019.  PE - ruled out by CTA AAA - ruled out by CTA  Assessment & Plan Epigastric pain Epigastric pain s/p meal and notable improvement s/p GI cocktail. Has a hx of hiatal hernia, GERD, and MI and endorsed that her pain felt similar to previous MI, but then thought pain may be primarily associated with hiatal hernia. Imaging negative for PE, AAA or dissection. EKG with junctional rhythm, but no ST elevation or tachycardia. Symptoms most consistent with GERD, but will continue to monitor troponin to ensure stabilization since it up-trended from 10>18. Symptoms resolved after GI cocktail in the ED. - Admit to FMTS, attending Dr. McDiarmid - Med-Tele, Vital signs per floor - Trend troponin - consider echo and cards consult - Repeat EKG if troponin trends up - Labs: CBC, BMP, A1c, Lipid panel - Needs PCP f/u for management of GERD  Hx of myocardial infarction Extensive history described below and now with mildly elevated troponin and  junctional rhythm on EKG: CAD s/p cardiac cath by Dr. Court 05/30/2016 revealing 90% mild LAD stenosis. Drug-eluting stent placed in 2018.  Catheterization  #2 by Dr. Verlin 08/14/2017 revealing progression of disease in the proximal LAD, restented.  EF 35-40%.   Catheterization #3 on 11/28/2017 revealing patent stents with an EF that had normalized. Myoview  in 12/24/2017 was nonischemic. Currently her chest pain has improved.  Troponin slightly up trended from 10-18.  With history of MI and HLD, LDL goal <70. - Trend troponin - Lipid panel and A1c ordered -  Should be on a statin however has been intolerant to PCSK9i and statin therapy in the past.  Has previously been referred to lipid clinic for further evaluation by Dr. Abran (cardiology). PA was attempted for Repatha  in the past. - Consider re-establishing with cardiology outpatient since she hasn't seen a cardiologist since 2021 w/ such extensive hx - Restarted ASA 81 mg daily - Consider echo and cards consult as she is not currently established out pt Essential hypertension BP elevated in the 120-160s/50s-100s. Likely secondary to pain vs not taking her medications today. Previously on Coreg  25 mg BID and Losartan  50 mg daily with her old PCP. She hasn't seen a PCP since 2022, assigned a new PCP Dr. Dorn Sauers.  - CTM, add anti-hypertensives as indicated  - Establish follow up with PCP Dr. Sauers [facility] IM) Anemia Microcytic anemia. Hgb 9.4. Can get iron  studies and consider iron  supplementation, need to review past colonoscopies if available  Memory changes Pt not consistent when answering questions about medications/seemed confused. Concern for memory imparement. Lives alone. Unsure if she is able to do ADLs/manage medicaitons  -OT consult   Chronic and Stable Problems: Anxiety/Depression: Previously on Lexapro 10 mg daily  FEN/GI: Heart Healthy Diet VTE Prophylaxis: Lovenox   Disposition: Med-Tele  History of  Present Illness:  Caitlyn [name] is a 78 y.o. female with past medical history of hiatal hernia, MI, GERD, HTN, and IDA presenting with chest/epigastric pain that started a few hours after eating prior to coming to the ED with nausea and abdominal cramping. Patient has a history of MI in the past and is concerned that this feels similar. Endorses some SOB, but denies severe dyspnea on exertion or diaphoresis. States mild SOB due to being hooked up to monitor which is annoying her. Describes pain was stabbing and radiating to her epigastrium however it has completely resolved after medications in the ED. States she has blurry vision when the pain started in setting of not wearing her glasses. Denies any headache, cough, congestion, fever, body aches, chills, abdominal pain or pain elsewhere. Admits to vomiting at home once prior to coming to the hospital.    States she has not been to a PCP since 2022 when her PCP, Dr. Delice, retired. New PCP is Dr. Charlott, but has not had an appointment with him yet.  In the ED, patient presented with chest pain. Trop increased from 10 to 18 otherwise CBC showed elevated WBC 11.3, Hgb 9.9, and platelets 428 and BMP with a slight increase in her Cr to 1.02 (unclear baseline). CXR demonstrated moderate-large hiatal hernia and no active cardiopulmonary disease. CT abd/pelvis negative for PE, AAA or dissection. Demonstrated large hiatal hernia/inverted intrathoracic stomach, sigmoid diverticulosis w/o evidence of diverticulitis. Received GI cocktail in the ED (Maalox, Bentyl , Carafate ) which helped improve her symptoms and she now pain free. Symptoms seem consistent moreso with GI vs cardiac but will admit and continue to monitor.   Review Of Systems: as above  Pertinent Past Medical History: MI HTN HLD GERD IDA  E coli Enteritis Remainder reviewed in history tab.   Pertinent Past Surgical History: Abdominal Hysterectomy Breast Biopsy, Lumpectomy, and Reduction  Surgery L heart cath 2018 and 2019 Coronary stent in 2019  Remainder reviewed in history tab.   Pertinent Social History: Tobacco use: Never Alcohol use: Yes Other Substance use: No Lives alone  Pertinent Family History: Mother: Heart disease, Alzheimer's disease Father: MI Brother: Heart disease MGF: Colon cancer Remainder reviewed in history tab.   Important Outpatient Medications: Not taking any medications Remainder reviewed in medication history.   Objective: BP (!) 160/102   Pulse 74   Temp 98.1 F (36.7 C)   Resp 17   Ht 5' 5 (1.651 m)   Wt 52.2 kg   SpO2 100%   BMI 19.14 kg/m  Exam: General: Awake and alert in NAD. HEENT: NCAT. Sclera anicteric. EOMI. No rhinorrhea. Cardiovascular: RRR. No M/R/G Respiratory: CTAB. Normal WOB on RA. No wheezing, crackles, rhonchi, or diminished breath sounds.  Gastrointestinal: Soft, non-tender, non-distended. Normoactive bowel sounds. MSK: No BLE edema. Able to move all four extremities. No noticeable joint abnormalities. Derm: Warm and dry Neuro: AAOx3. No focal neurological deficits. Psych: Mood and affect stable.  Labs:  CBC BMET  Recent Labs  Lab 05/21/23 1922  WBC 11.3*  HGB 9.9*  HCT 35.1*  PLT 428*   Recent Labs  Lab 05/21/23 1922  NA 135  K 3.5  CL 104  CO2 16*  BUN 22  CREATININE 1.02*  GLUCOSE 101*  CALCIUM  9.6    Troponin: 10 > 18 Lipase: 43  EKG: Regular rate 89. Junctional rhythm.   Imaging Studies Performed:  CXR: No active cardiopulmonary disease. Moderate to large hiatal hernia.  CTA chest/abd/pelvis: No evidence of thoracic or abdominal aortic aneurysm or dissection. No evidence of pulmonary embolism. Large hiatal hernia/inverted intrathoracic stomach. Sigmoid diverticulosis, without evidence of diverticulitis.  Janna Ferrier, DO 05/22/2023, 12:16 AM PGY-1, [facility]  FPTS Intern pager: [phone], text pages welcome Secure chat group [facility] Teaching Service   I was personally present and re-performed the exam and medical decision making and verified the service and findings are accurately documented in Dr. Janna' note.  Lauraine Molt, DO 05/22/2023 5:01 AM

## 2023-05-22 ENCOUNTER — Encounter (HOSPITAL_COMMUNITY): Payer: Self-pay | Admitting: Family Medicine

## 2023-05-22 ENCOUNTER — Observation Stay (HOSPITAL_BASED_OUTPATIENT_CLINIC_OR_DEPARTMENT_OTHER): Payer: Medicare Other

## 2023-05-22 DIAGNOSIS — R1013 Epigastric pain: Secondary | ICD-10-CM

## 2023-05-22 DIAGNOSIS — I252 Old myocardial infarction: Secondary | ICD-10-CM

## 2023-05-22 DIAGNOSIS — R413 Other amnesia: Secondary | ICD-10-CM | POA: Insufficient documentation

## 2023-05-22 DIAGNOSIS — R079 Chest pain, unspecified: Secondary | ICD-10-CM | POA: Diagnosis not present

## 2023-05-22 LAB — BASIC METABOLIC PANEL
Anion gap: 12 (ref 5–15)
BUN: 21 mg/dL (ref 8–23)
CO2: 20 mmol/L — ABNORMAL LOW (ref 22–32)
Calcium: 9 mg/dL (ref 8.9–10.3)
Chloride: 102 mmol/L (ref 98–111)
Creatinine, Ser: 0.86 mg/dL (ref 0.44–1.00)
GFR, Estimated: >60 mL/min (ref >60)
Glucose, Bld: 133 mg/dL — ABNORMAL HIGH (ref 70–99)
Potassium: 3.5 mmol/L (ref 3.5–5.1)
Sodium: 134 mmol/L — ABNORMAL LOW (ref 135–145)

## 2023-05-22 LAB — ECHOCARDIOGRAM COMPLETE
AR max vel: 1.73 cm2
AV Area VTI: 1.93 cm2
AV Area mean vel: 1.74 cm2
AV Mean grad: 6 mm[Hg]
AV Peak grad: 10.4 mm[Hg]
Ao pk vel: 1.61 m/s
Area-P 1/2: 3.33 cm2
Calc EF: 68.9 %
Height: 65 in
MV VTI: 1.9 cm2
S' Lateral: 2.4 cm
Single Plane A2C EF: 71.3 %
Single Plane A4C EF: 64.6 %
Weight: 1840 [oz_av]

## 2023-05-22 LAB — CBC
HCT: 33.3 % — ABNORMAL LOW (ref 36.0–46.0)
Hemoglobin: 9.4 g/dL — ABNORMAL LOW (ref 12.0–15.0)
MCH: 20.3 pg — ABNORMAL LOW (ref 26.0–34.0)
MCHC: 28.2 g/dL — ABNORMAL LOW (ref 30.0–36.0)
MCV: 71.8 fL — ABNORMAL LOW (ref 80.0–100.0)
Platelets: 335 10*3/uL (ref 150–400)
RBC: 4.64 MIL/uL (ref 3.87–5.11)
RDW: 18.1 % — ABNORMAL HIGH (ref 11.5–15.5)
WBC: 8.4 10*3/uL (ref 4.0–10.5)
nRBC: 0 % (ref 0.0–0.2)

## 2023-05-22 LAB — FERRITIN: Ferritin: 3 ng/mL — ABNORMAL LOW (ref 11–307)

## 2023-05-22 LAB — LIPID PANEL
Cholesterol: 183 mg/dL (ref 0–200)
HDL: 46 mg/dL (ref >40)
LDL Cholesterol: 123 mg/dL — ABNORMAL HIGH (ref 0–99)
Total CHOL/HDL Ratio: 4 {ratio}
Triglycerides: 70 mg/dL (ref <150)
VLDL: 14 mg/dL (ref 0–40)

## 2023-05-22 LAB — TROPONIN I (HIGH SENSITIVITY)
Troponin I (High Sensitivity): 39 ng/L — ABNORMAL HIGH (ref <18)
Troponin I (High Sensitivity): 38 ng/L — ABNORMAL HIGH (ref <18)
Troponin I (High Sensitivity): 32 ng/L — ABNORMAL HIGH (ref <18)
Troponin I (High Sensitivity): 26 ng/L — ABNORMAL HIGH (ref <18)

## 2023-05-22 LAB — HEMOGLOBIN A1C
Hgb A1c MFr Bld: 6 % — ABNORMAL HIGH (ref 4.8–5.6)
Mean Plasma Glucose: 125.5 mg/dL

## 2023-05-22 MED ORDER — PANTOPRAZOLE SODIUM 40 MG PO TBEC: 40.0000 mg | DELAYED_RELEASE_TABLET | Freq: Every day | ORAL | 0 refills | Status: DC

## 2023-05-22 MED ORDER — ASPIRIN 81 MG PO TBEC
81.0000 mg | DELAYED_RELEASE_TABLET | Freq: Every day | ORAL | Status: DC
  Administered 2023-05-22: 81 mg via ORAL
  Filled 2023-05-22: qty 1

## 2023-05-22 MED ORDER — ALUM & MAG HYDROXIDE-SIMETH 200-200-20 MG/5ML PO SUSP
30.0000 mL | Freq: Once | ORAL | Status: AC
  Administered 2023-05-22: 30 mL via ORAL
  Filled 2023-05-22: qty 30

## 2023-05-22 MED ORDER — SUCRALFATE 1 G PO TABS
1.0000 g | ORAL_TABLET | Freq: Once | ORAL | Status: AC
  Administered 2023-05-22: 1 g via ORAL
  Filled 2023-05-22: qty 1

## 2023-05-22 MED ORDER — AMLODIPINE BESYLATE 5 MG PO TABS: 5.0000 mg | ORAL_TABLET | Freq: Every day | ORAL | 0 refills | Status: DC

## 2023-05-22 MED ORDER — ENOXAPARIN SODIUM 40 MG/0.4ML IJ SOSY
40.0000 mg | PREFILLED_SYRINGE | Freq: Every day | INTRAMUSCULAR | Status: DC
  Administered 2023-05-22: 40 mg via SUBCUTANEOUS
  Filled 2023-05-22: qty 0.4

## 2023-05-22 MED ORDER — DICYCLOMINE HCL 10 MG PO CAPS
10.0000 mg | ORAL_CAPSULE | Freq: Once | ORAL | Status: AC
  Administered 2023-05-22: 10 mg via ORAL
  Filled 2023-05-22: qty 1

## 2023-05-22 MED ORDER — MELATONIN 3 MG PO TABS
3.0000 mg | ORAL_TABLET | Freq: Every evening | ORAL | Status: DC | PRN
  Administered 2023-05-22: 3 mg via ORAL
  Filled 2023-05-22: qty 1

## 2023-05-22 MED ORDER — PANTOPRAZOLE SODIUM 40 MG PO TBEC
40.0000 mg | DELAYED_RELEASE_TABLET | Freq: Every day | ORAL | Status: DC
  Administered 2023-05-22: 40 mg via ORAL
  Filled 2023-05-22: qty 1

## 2023-05-22 MED ORDER — CARVEDILOL 3.125 MG PO TABS: 6.2500 mg | ORAL_TABLET | Freq: Two times a day (BID) | ORAL | Status: DC

## 2023-05-22 MED ORDER — FERROUS SULFATE 325 (65 FE) MG PO TBEC: 325.0000 mg | DELAYED_RELEASE_TABLET | ORAL | 0 refills | Status: DC

## 2023-05-22 MED ORDER — CARVEDILOL 6.25 MG PO TABS: 6.2500 mg | ORAL_TABLET | Freq: Two times a day (BID) | ORAL | 0 refills | Status: DC

## 2023-05-22 NOTE — ED Notes (Signed)
 Pt neighbor notified at this time as pt is for discharge. Unable to leave pt in the lobby as pt has memory impairment and gets confused easy. Per neighbor, Erminio Bars, she is coming to pick up pt approximately around 1730. Will notify CN of pending discharge.

## 2023-05-22 NOTE — ED Notes (Signed)
 Pt is requesting to talk to admit team at this time as pt states, I am so frustrated as why I am still here. I've been here for 2 days. MD notified and has agreed to contact pt to explain treatment plan. Currently on the phone with admit team with family medicine.

## 2023-05-22 NOTE — Assessment & Plan Note (Signed)
 Pt not consistent when answering questions about medications/seemed confused. Concern for memory imparement. Lives alone. Unsure if she is able to do ADLs/manage medicaitons  -OT consult

## 2023-05-22 NOTE — Evaluation (Signed)
 Occupational Therapy Evaluation Patient Details Name: Caitlyn [name] MRN: [id number] DOB: [birth year] Today's Date: 05/22/2023   History of Present Illness Pt is a 78 yo female presenting to [facility] on 05/21/23 for epigastric pain. PMH of MI, HTN, HLD, GERD, IDA.   Clinical Impression   Pt admitted for above, she c/o mild hip bone pain more so around her iliac crest but still open to OT session. Pt a bit unsteady from her reported baseline and needing CGA + RW for balance although she still has some instability with a mild LOB episode. Pt will be in acute stay for a bit longer than anticipate per PA while in room, OT to continue following pt acutely to reduce risk of decline. Mobility specialists and PT consulted to help improve balance and continue strengthening pt before DC. NO post acute OT needed.       If plan is discharge home, recommend the following: Other (comment) (prn)    Functional Status Assessment  Patient has had a recent decline in their functional status and demonstrates the ability to make significant improvements in function in a reasonable and predictable amount of time.  Equipment Recommendations  None recommended by OT    Recommendations for Other Services PT consult     Precautions / Restrictions Precautions Precautions: Fall Restrictions Weight Bearing Restrictions Per Provider Order: No      Mobility Bed Mobility Overal bed mobility: Modified Independent                  Transfers Overall transfer level: Needs assistance Equipment used: Rolling walker (2 wheels) Transfers: Sit to/from Stand Sit to Stand: Contact guard assist                  Balance Overall balance assessment: Needs assistance Sitting-balance support: No upper extremity supported, Feet supported Sitting balance-Leahy Scale: Fair       Standing balance-Leahy Scale: Poor Standing balance comment: unsteady without RW                           ADL either  performed or assessed with clinical judgement   ADL Overall ADL's : Needs assistance/impaired Eating/Feeding: Independent;Sitting   Grooming: Standing;Contact guard assist   Upper Body Bathing: Set up;Sitting   Lower Body Bathing: Sitting/lateral leans;Supervison/ safety Lower Body Bathing Details (indicate cue type and reason): supervision for safety on ED stretcher Upper Body Dressing : Modified independent;Sitting   Lower Body Dressing: Supervision/safety;Sitting/lateral leans Lower Body Dressing Details (indicate cue type and reason): supervision for safety on ED stretcher Toilet Transfer: Ambulation;Rolling walker (2 wheels);Contact guard assist   Toileting- Clothing Manipulation and Hygiene: Contact guard assist;Sitting/lateral lean       Functional mobility during ADLs: Rolling walker (2 wheels);Contact guard assist       Vision         Perception         Praxis         Pertinent Vitals/Pain Pain Assessment Pain Assessment: Faces Faces Pain Scale: Hurts a little bit Pain Location: L illiac Pain Descriptors / Indicators: Aching Pain Intervention(s): Monitored during session     Extremity/Trunk Assessment Upper Extremity Assessment Upper Extremity Assessment: Generalized weakness   Lower Extremity Assessment Lower Extremity Assessment: Generalized weakness   Cervical / Trunk Assessment Cervical / Trunk Assessment: Kyphotic   Communication Communication Communication: No apparent difficulties Cueing Techniques: Verbal cues   Cognition Arousal: Alert Behavior During Therapy: WFL for tasks assessed/performed Overall  Cognitive Status: Within Functional Limits for tasks assessed                                       General Comments       Exercises     Shoulder Instructions      Home Living Family/patient expects to be discharged to:: Private residence Living Arrangements: Alone Available Help at Discharge: Neighbor;Available  PRN/intermittently Type of Home: House Home Access: Stairs to enter Entrance Stairs-Number of Steps: 1   Home Layout: Two level;Able to live on main level with bedroom/bathroom Alternate Level Stairs-Number of Steps: n/a   Bathroom Shower/Tub: Door;Walk-in Human Resources Officer: Standard Bathroom Accessibility: No   Home Equipment: Shower seat - built in;Grab bars - Chartered Loss Adjuster (2 wheels);BSC/3in1   Additional Comments: walks 5 mi every day      Prior Functioning/Environment Prior Level of Function : Independent/Modified Independent             Mobility Comments: ind ADLs Comments: ind        OT Problem List: Pain;Impaired balance (sitting and/or standing);Decreased strength      OT Treatment/Interventions: Balance training;Self-care/ADL training;DME and/or AE instruction;Therapeutic exercise;Therapeutic activities;Patient/family education    OT Goals(Current goals can be found in the care plan section) Acute Rehab OT Goals Patient Stated Goal: To go home OT Goal Formulation: With patient Time For Goal Achievement: 06/05/23 Potential to Achieve Goals: Good ADL Goals Pt Will Perform Grooming: with modified independence;standing Pt Will Perform Lower Body Dressing: with modified independence;sit to/from stand Pt Will Transfer to Toilet: with modified independence;ambulating Pt/caregiver will Perform Home Exercise Program: Increased strength;Both right and left upper extremity;With written HEP provided;With theraband  OT Frequency: Min 1X/week    Co-evaluation              AM-PAC OT 6 Clicks Daily Activity     Outcome Measure Help from another person eating meals?: None Help from another person taking care of personal grooming?: A Little Help from another person toileting, which includes using toliet, bedpan, or urinal?: A Little Help from another person bathing (including washing, rinsing, drying)?: A Little Help from another person to  put on and taking off regular upper body clothing?: None Help from another person to put on and taking off regular lower body clothing?: A Little 6 Click Score: 20   End of Session Equipment Utilized During Treatment: Gait belt;Rolling walker (2 wheels) Nurse Communication: Mobility status  Activity Tolerance: Patient tolerated treatment well Patient left: in bed;with call bell/phone within reach  OT Visit Diagnosis: Unsteadiness on feet (R26.81);Pain Pain - Right/Left: Left Pain - part of body:  (hip bone)                Time: 8889-8863 OT Time Calculation (min): 26 min Charges:  OT General Charges $OT Visit: 1 Visit OT Evaluation $OT Eval Low Complexity: 1 Low OT Treatments $Therapeutic Activity: 8-22 mins  05/22/2023  AB, OTR/L  Acute Rehabilitation Services  Office: [phone]   Curtistine JONETTA Das 05/22/2023, 2:04 PM

## 2023-05-22 NOTE — Assessment & Plan Note (Signed)
 Microcytic anemia. Hgb 9.4. Can get iron studies and consider iron supplementation, need to review past colonoscopies if available

## 2023-05-22 NOTE — Assessment & Plan Note (Addendum)
 Epigastric pain s/p meal and notable improvement s/p GI cocktail in ED. Admitted for ACS r/o. Troponin 10>18>39>38. Spoke with Dr. Sheena with Cardiology for consultation. Worse this AM-consider PUD given anemia and epigastric pain - Protonix  40 daily - Ferritin, FOBT > if concerning consider GI consult - GI Cocktail ordered - Repeat EKG, trops - Med-Tele, Vital signs per floor - Low threshold for serial EKG and troponin - Cardiology consulted, appreciate rechs - Labs: CBC, BMP, A1c, Lipid panel

## 2023-05-22 NOTE — ED Notes (Signed)
 Pt refused occult blood testing at this time. MD notified. Pt is very anxious and states, I have not talk to any doctor for 2 days. I don't know why I'm still here. I need something for my hernia. I'm hurting so bad. Protonix  given as ordered. MD called and spoke to pt on the phone. RN came back to pt room after 15 minutes and pt asked again to talk to admit team. RN told pt she was just on the phone with the doctor. Pt states, That was my family member. Admit team notified of this situation. Will continue to monitor.

## 2023-05-22 NOTE — Assessment & Plan Note (Signed)
 BP more normotensive today. Previously on Coreg 25 mg BID and Losartan 50 mg. - Consider restarting Coreg 6.25 mg BID and inc as needed over the course of the next 1-2 with PCP.  - Establish follow up with PCP Dr. Orson Aloe [facility] IM)

## 2023-05-22 NOTE — Discharge Summary (Signed)
 Family Medicine Teaching [facility] Discharge Summary  Patient name: Caitlyn [name] Medical record number: [id number] Date of birth: November 07, 1945 Age: 78 y.o. Gender: female Date of Admission: 05/21/2023  Date of Discharge: 05/22/2023 Admitting Physician: Kathrine Melena, DO  Primary Care Provider: Pcp, No Consultants: Cardiology  Indication for Hospitalization:   Brief Hospital Course:  Caitlyn [name] is a 78 y.o.female with a history of NSTEMI, HTN, IDA, GERD, and anemia who was admitted to the [facility] Medicine Teaching Service at [facility] for epigastric pain. Her hospital course is detailed below:  Epigastric Pain Epigastric pain s/p meal and notable improvement s/p GI cocktail in ED. Admitted for ACS r/o. Troponin 10>18>39>38>32. Spoke with Dr. Sheena with Cardiology for consultation and no ischemic pathology. EKG without ischemic changes. Cardiology recommended starting amlodipine  5 mg daily for antianginal as did not tolerate nitroglycerin . Has hiatal hernia and GERD. Started on protonix  here.  Anemia Hgb 9.9 to 9.4. Microcytic. Ferritin 3. Hx of GI bleed and IDA, diverticulosis. No active bleeding, did not want additional scope inpatient. Recommended outpatient GI follow up. Started on iron  tablet  Memory changes Pt not consistent when answering questions about medications/seemed confused. Concern for memory imparement. Lives alone. OT consulted and stated ADLs can be managed without follow up. Recommend outpatient [facility]  Other chronic conditions were medically managed with home medications and formulary alternatives as necessary (HTN)  PCP Follow-up Recommendations: Consider inclisiran outpatient with cardiology He started on Coreg  6.25 twice daily, recommend increasing as needed outpatient Recommend close PCP  GI follow-up for IDA -recommend ferritin check again in 4 weeks Ensure cardiology follow-up Recommend CBC and BMP at follow up  Disposition: Home  Discharge Condition:  Stable  Discharge Exam:  General: Well appearing, NAD, awake, alert, responsive to questions Head: Normocephalic atraumatic CV: Regular rate and rhythm no murmurs rubs or gallops Respiratory: Clear to ausculation bilaterally, no wheezes rales or crackles, chest rises symmetrically,  no increased work of breathing Abdomen: Soft, non-tender, non-distended, normoactive bowel sounds  Extremities: Moves upper and lower extremities freely, no edema in LE Neuro: No focal deficits Skin: No rashes or lesions visualized   Significant Procedures: None  Significant Labs and Imaging:  Recent Labs  Lab 05/21/23 1922 05/22/23 0257  WBC 11.3* 8.4  HGB 9.9* 9.4*  HCT 35.1* 33.3*  PLT 428* 335   Recent Labs  Lab 05/21/23 1922 05/22/23 0257  NA 135 134*  K 3.5 3.5  CL 104 102  CO2 16* 20*  GLUCOSE 101* 133*  BUN 22 21  CREATININE 1.02* 0.86  CALCIUM  9.6 9.0  ALKPHOS 75  --   AST 22  --   ALT 12  --   ALBUMIN 4.2  --    Results/Tests Pending at Time of Discharge:   Discharge Medications:  Allergies as of 05/22/2023       Reactions   Cortisone Anaphylaxis   Some forms of cortisone; patient cannot recall the reaction because it's been a long time since she's taken this medications   Crestor  [rosuvastatin  Calcium ] Other (See Comments)   Memory loss for 3 months   Demerol [meperidine] Anaphylaxis, Other (See Comments)   Extremely ill with N/V and resp.   Monosodium Glutamate Anaphylaxis   Morphine And Codeine Anaphylaxis, Nausea And Vomiting, Other (See Comments)   Prednisone Anaphylaxis   confusion   Shellfish Allergy Anaphylaxis   Sulfa Antibiotics Anaphylaxis   Buprenorphine Hcl Nausea And Vomiting, Hypertension   SHAKING   Hctz [hydrochlorothiazide ] Other (See Comments)  Dizziness    Readi-cat [barium Sulfate] Diarrhea   Patient does not want to drink Readi-Cat because it gives her diarrhea   Xanax [alprazolam] Other (See Comments)   makes me crazy   Rosuvastatin   Other (See Comments)   Immediate Memory Loss    Benzoic Acid Nausea Only        Medication List     PAUSE taking these medications    losartan  50 MG tablet Wait to take this until your doctor or other care provider tells you to start again. Commonly known as: COZAAR  Take 1 tablet (50 mg total) by mouth daily.       STOP taking these medications    nitroGLYCERIN  0.4 MG SL tablet Commonly known as: NITROSTAT        TAKE these medications    acetaminophen  500 MG tablet Commonly known as: TYLENOL  Take 500 mg by mouth every 6 (six) hours as needed for moderate pain (pain score 4-6).   ALBUTEROL  SULFATE HFA IN Inhale 2 puffs into the lungs every 6 (six) hours as needed (shortness of breath/wheezing).   amLODipine  5 MG tablet Commonly known as: NORVASC  Take 1 tablet (5 mg total) by mouth daily.   aspirin  EC 81 MG tablet Take 1 tablet (81 mg total) by mouth daily.   carvedilol  6.25 MG tablet Commonly known as: COREG  Take 1 tablet (6.25 mg total) by mouth 2 (two) times daily with a meal. What changed:  medication strength how much to take   ferrous sulfate  325 (65 FE) MG EC tablet Take 1 tablet (325 mg total) by mouth every other day.   pantoprazole  40 MG tablet Commonly known as: PROTONIX  Take 1 tablet (40 mg total) by mouth daily. Start taking on: May 23, 2023   VITAMIN C PO Take 1 tablet by mouth daily.   VITAMIN D PO Take 3,000 Units by mouth daily.        Discharge Instructions: Please refer to Patient Instructions section of EMR for full details.  Patient was counseled important signs and symptoms that should prompt return to medical care, changes in medications, dietary instructions, activity restrictions, and follow up appointments.   Follow-Up Appointments:  Follow-up Information     Court Dorn PARAS, MD Follow up on 06/23/2023.   Specialties: Cardiology, Radiology Why: Post hospital follow up at 10:00 AM. Please arrive 15 mins  early. Contact information: [address] [place] [address] [phone]                 Christia Budds, MD 05/22/2023, 4:45 PM PGY-3, [facility] Health Family Medicine

## 2023-05-22 NOTE — ED Notes (Signed)
Patient transported to echocardiogram.  

## 2023-05-22 NOTE — Care Management (Addendum)
 Transition of Care [facility]) - Inpatient Brief Assessment   Patient Details  Name: Caitlyn [name] MRN: [id number] Date of Birth: 08-09-1945  Transition of Care [facility]) CM/SW Contact:    Corean JAYSON Canary, RN Phone Number: 05/22/2023, 12:46 PM   Clinical Narrative:   Patient presented with abdominal pain,  agitated at times, confused orineted to self. Lives alone, [name] is listed  as next of kin Mrs Taft for collateral information.. She states she has a sister,   [name] lives in [place]   [phone]  would be the contact  for decision making. She is unsure if she has MPOA. [name] states she does worry about her at home, she has been confused, but is set in her ways also and Stubborn    [name] would be the one picking her up should she be discharged home.  Toc will follow for needs, recommendations, and transitions of care Spoke to ms Hern , her sister,  she does not have MPOA but is helping her with her bills  her daughter does have MPOA, but they have not spoken in several years and are estranged. She is also concerned about her sister and going home, shed has had erratic behaviors for a few years she has not seen a doctor in at least 4 years. Dr Court is her cardiologist.  She has not had competency assessment. She will need to establish care with a PCP.   Transition of Care Asessment: Insurance and Status: Insurance coverage has been reviewed Patient has primary care physician: No Home environment has been reviewed: Lives by sELF Prior level of function:: iHOME ALONE INDEPENDENT Prior/Current Home Services: No current home services Social Drivers of Health Review:  (unknown at this time screen not done) Readmission risk has been reviewed: Yes Transition of care needs: transition of care needs identified, TOC will continue to follow

## 2023-05-22 NOTE — Plan of Care (Signed)
 FMTS Interim Progress Note  Spoke with cardiology Dr. Jonda wnl can d/c home with amlodipine  and follow up scheduled in feb Spoke with neighbor who can pick patient up as she is ready to d/c Tried calling sister x 3 no answer Went to bedside with Dr. Concha made aware of findings and discharge. Discussed low iron  and need for GI outpatient. She states she does not want another colonoscopy. I will start iron  supplement for her. Discussed need for PCP follow up Patient discharged with amlodipine  new rx and iron  new rx  Christia Budds, MD 05/22/2023, 4:42 PM PGY-3, [facility] Health Family Medicine Service pager [phone]

## 2023-05-22 NOTE — Plan of Care (Signed)
 Called patient about her feeling frustrated here-states she has been here for 2 days Denies any bloody stools and dark stools Abdominal pain better Discussed cardiac workup thus far reassuring but waiting on attending cosign and echo read Discussed started medicine to treat reflux symptoms Discussed we need to wait for lab results/images States she understands but she does not want to be here until December

## 2023-05-22 NOTE — ED Notes (Signed)
 Went into pts room again to try and get another BP and pt is refusing to keep the BP cuff on stating "this is too damn tight!" Told pt that her BP is reading high and we wanted to check to see if its accurate. Pt ripped off BP cuff and pts RN was notified.

## 2023-05-22 NOTE — Assessment & Plan Note (Addendum)
 Extensive history described below and now with mildly elevated troponin and junctional rhythm on EKG: CAD s/p cardiac cath by Dr. Court 05/30/2016 revealing 90% mild LAD stenosis. Drug-eluting stent placed in 2018.  Catheterization  #2 by Dr. Verlin 08/14/2017 revealing progression of disease in the proximal LAD, restented.  EF 35-40%.   Catheterization #3 on 11/28/2017 revealing patent stents with an EF that had normalized. Myoview  in 12/24/2017 was nonischemic. Currently her chest pain has improved.  Troponin slightly up trended from 10-18.  With history of MI and HLD, LDL goal <70. - Trend troponin - Lipid panel and A1c ordered -  Should be on a statin however has been intolerant to PCSK9i and statin therapy in the past.  Has previously been referred to lipid clinic for further evaluation by Dr. Abran (cardiology). PA was attempted for Repatha  in the past. - Consider re-establishing with cardiology outpatient since she hasn't seen a cardiologist since 2021 w/ such extensive hx - Restarted ASA 81 mg daily - Consider echo and cards consult as she is not currently established out pt

## 2023-05-22 NOTE — Assessment & Plan Note (Addendum)
 Epigastric pain s/p meal and notable improvement s/p GI cocktail. Has a hx of hiatal hernia, GERD, and MI and endorsed that her pain felt similar to previous MI, but then thought pain may be primarily associated with hiatal hernia. Imaging negative for PE, AAA or dissection. EKG with junctional rhythm, but no ST elevation or tachycardia. Symptoms most consistent with GERD, but will continue to monitor troponin to ensure stabilization since it up-trended from 10>18. Symptoms resolved after GI cocktail in the ED. - Admit to FMTS, attending Dr. McDiarmid - Med-Tele, Vital signs per floor - Trend troponin - consider echo and cards consult - Repeat EKG if troponin trends up - Labs: CBC, BMP, A1c, Lipid panel - Needs PCP f/u for management of GERD

## 2023-05-22 NOTE — Plan of Care (Addendum)
 FMTS Interim Progress Note  S:Patient states that she is having another bout of bad epigastric pain. Says related toreflux but also says it's my heart. Feels different than her MI in past this AM  O: BP 118/79   Pulse 87   Temp 98.4 F (36.9 C)   Resp 18   Ht 5' 5 (1.651 m)   Wt 52.2 kg   SpO2 99%   BMI 19.14 kg/m    General: Well appearing, NAD, awake, alert, responsive to questions Head: Normocephalic atraumatic CV: Regular rate and rhythm no murmurs rubs or gallops Respiratory: Clear to ausculation bilaterally, no wheezes rales or crackles, chest rises symmetrically,  no increased work of breathing Abdomen: Soft, non-tender, non-distended, normoactive bowel sounds  Extremities: Moves upper and lower extremities freely, no edema in LE Neuro: No focal deficits Skin: No rashes or lesions visualized   A/P: Assessment & Plan Epigastric pain  Elevated Troponin Epigastric pain s/p meal and notable improvement s/p GI cocktail in ED. Admitted for ACS r/o. Troponin 10>18>39>38. Spoke with Dr. Sheena with Cardiology for consultation. Worse this AM-consider PUD given anemia and epigastric pain - Protonix  40 daily - Ferritin, FOBT > if concerning consider GI consult - GI Cocktail ordered - Repeat EKG, trops - Med-Tele, Vital signs per floor - Low threshold for serial EKG and troponin - Cardiology consulted, appreciate rechs - Labs: CBC, BMP, A1c, Lipid panel Hx of myocardial infarction Extensive history described below and now with mildly elevated troponin and junctional rhythm on EKG. Hx NSTEMI.  CAD s/p cardiac cath by Dr. Court 05/30/2016 revealing 90% mild LAD stenosis. Drug-eluting stent placed in 2018.  Catheterization  #2 by Dr. Verlin 08/14/2017 revealing progression of disease in the proximal LAD, restented.  EF 35-40%.   Catheterization #3 on 11/28/2017 revealing patent stents with an EF that had normalized. Myoview  in 12/24/2017 was nonischemic. Currently her chest pain  resolved but does have epigastric pain.  Troponin slightly up trended from 10>38.  With history of MI and HLD, LDL goal <70-LDL 123 on last. A1c 6.0. - Trend troponin -  Should be on a statin however has been intolerant to PCSK9i and statin therapy in the past.  Has previously been referred to lipid clinic for further evaluation by Dr. Abran (cardiology). PA was attempted for Repatha  in the past. - Consider re-establishing with cardiology outpatient since she hasn't seen a cardiologist since 2021 w/ such extensive hx -F/u Echo - Restarted ASA 81 mg daily Essential hypertension BP more normotensive today. Previously on Coreg  25 mg BID and Losartan  50 mg. - Consider restarting Coreg  6.25 mg BID and inc as needed over the course of the next 1-2 with PCP.  - Establish follow up with PCP Dr. Charlott ([facility]) Anemia Hgb 9.9 and 9.4 today. Microcytic. Hx of GI bleed and IDA, diverticulosis. Last colonoscopy 2018 no active bleeding. Stated at that time did not want any more colonoscopies.  -CBC -Consider GI consult if ferritin and occult blood positive Memory changes Pt not consistent when answering questions about medications/seemed confused. Concern for memory imparement. Lives alone. Unsure if she is able to do ADLs/manage medicaitons  -OT consult    Christia Budds, MD 05/22/2023, 7:07 AM PGY-3, [facility] Family Medicine Service pager [phone]

## 2023-05-22 NOTE — Care Management Obs Status (Signed)
 MEDICARE OBSERVATION STATUS NOTIFICATION   Patient Details  Name: Caitlyn [name] MRN: [id number] Date of Birth: [birth year]   Medicare Observation Status Notification Given:  Yes    Lockie Pares, RN 05/22/2023, 1:14 PM

## 2023-05-22 NOTE — Consult Note (Addendum)
 Cardiology Consultation   Patient ID: Caitlyn [name] MRN: [id number]; DOB: 07/06/1945  Admit date: 05/21/2023 Date of Consult: 05/22/2023  PCP:  Freddrick No   [facility] Providers Cardiologist:  Dorn Lesches, MD        Patient Profile:   Caitlyn [name] is a 78 y.o. female with a hx of coronary artery disease s/p NSTEMI in 2018 tx with DES to mLAD, NSTEMI in 2019 tx with DES x 2 to LAD (prior stent patent) c/b jailed Dx, hypertension, hyperlipidemia (intol of statins, PCSK9 inhibs), GERD, microcytic anemia who is being seen 05/22/2023 for the evaluation of chest pain at the request of Dr. Wendel.  History of Present Illness:   Caitlyn [name] was last seen by Dr. Lesches in 01/2020. Her last cardiac catheterization was in 11/2017 for unstable angina. This demonstrated patent LAD stents and jailed diagonal with no change from prior images. OP Myoview  in 12/2017 was low risk and she was managed medically. Last echocardiogram in 01/2020 showed normal EF, mild LVH, Grade 1 diastolic dysfunction.  She presented to the ED yesterday with chest pain after eating. Her troponins have been minimally elevated and Cardiology is asked to further evaluate.   ED Data: Na 134, K 3.5, SCr 0.86, ALT 12, Alb 4.2, Hgb 9.4, MCV 71.8, Plt 335  hsT 10 - 18 - 39 - 38 LDL 123  CXR: no acute dz; mod to large hiatal hernia  CTA: no thoracic or abd aneurysm or dissection, no PE; Lg hiatal hernia; aortic atherosclerosis  TTE: pending  EKG 05/21/23: NSR, HR 89, normal axis, nonspecific ST-T wave changes, QTc 445  She notes severe chest pain after eating about a week ago. She describes associated intrascapular pain as well as hematemesis. She also notes syncope vs near syncope when the pain was severe. She has had continued substernal pain since. Although, currently, she is pain free. She usually walks 5 miles a day and been able to do this up until her pain started without difficulty. She has not had orthopnea, edema. She  is a non-smoker.   Past Medical History:  Diagnosis Date   Anemia    Arthritis    Bleeding diathesis (HCC) 02/22/2013   Closed right hip fracture (HCC) 05/28/2016   Coronary artery disease 06/07/2016   NSTEMI 05/2016 s/p 2.25 x 20 mm DES to mid LAD // NSTEMI 08/2017 s/p 2.5 x 8 mm DES to mid LAD, 3 x 12 mm DES to proximal LAD  LHC 11/29/2017: Prox and mid LAD stents patent, jailed D1 proximal 50, mid 60-80 (unchanged) // Nuc stress 12/2017: low risk   E coli enteritis    GERD (gastroesophageal reflux disease)    H/O hiatal hernia    Head injury    Hypertension    Iron  deficiency anemia, unspecified 02/22/2013   Mass of right kidney 08/22/2013   2.5 cm solid lesion seen on 5/14 CT   Myocardial infarction [facility])    Radicular pain of thoracic region 08/22/2013   Left T-8 started after pushing heavy chest 1/15; intermittent; no focal neuro deficit    Past Surgical History:  Procedure Laterality Date   ABDOMINAL HYSTERECTOMY     BREAST BIOPSY     BREAST LUMPECTOMY Left    BREAST REDUCTION SURGERY  1981   BREAST SURGERY  1981   rt-nodes taken out-non cancer   BREAST SURGERY  1981   rt lumpectomy   CARDIAC CATHETERIZATION N/A 06/01/2016   Procedure: LEFT HEART CATH;  Surgeon:  Dorn JINNY Lesches, MD;  Location: [facility];  Service: Cardiovascular;  Laterality: N/A;   CARDIAC CATHETERIZATION N/A 06/01/2016   Procedure: Coronary Stent Intervention;  Surgeon: Dorn JINNY Lesches, MD;  Location: [facility];  Service: Cardiovascular;  Laterality: N/A;   COLONOSCOPY     CORONARY STENT INTERVENTION N/A 08/14/2017   Procedure: CORONARY STENT INTERVENTION;  Surgeon: Verlin Lonni BIRCH, MD;  Location: [facility];  Service: Cardiovascular;  Laterality: N/A;   FACIAL FRACTURE SURGERY  2000   FEMUR IM NAIL Right 06/03/2016   Procedure: INTRAMEDULLARY (IM) NAIL FEMORAL;  Surgeon: Redell Shoals, MD;  Location: [facility] OR;  Service: Orthopedics;  Laterality: Right;   LEFT HEART  CATH AND CORONARY ANGIOGRAPHY N/A 08/14/2017   Procedure: LEFT HEART CATH AND CORONARY ANGIOGRAPHY;  Surgeon: Verlin Lonni BIRCH, MD;  Location: [facility];  Service: Cardiovascular;  Laterality: N/A;   LEFT HEART CATH AND CORONARY ANGIOGRAPHY N/A 11/29/2017   Procedure: LEFT HEART CATH AND CORONARY ANGIOGRAPHY;  Surgeon: Claudene Victory ORN, MD;  Location: [facility];  Service: Cardiovascular;  Laterality: N/A;   REDUCTION MAMMAPLASTY Bilateral    years ago   TONSILLECTOMY     TRIGGER FINGER RELEASE Right 11/28/2013   Procedure: RELEASE TRIGGER FINGER/A-1 PULLEY RIGHT LONG FINGER;  Surgeon: Lamar Leonor Mickey LULLA, MD;  Location: [facility];  Service: Orthopedics;  Laterality: Right;   URETHRA SURGERY     age 68     Home Medications:  Prior to Admission medications   Medication Sig Start Date End Date Taking? Authorizing Provider  ALBUTEROL  SULFATE HFA IN Inhale 2 puffs into the lungs every 6 (six) hours as needed (shortness of breath/wheezing).    [provider]  aspirin  81 MG EC tablet Take 1 tablet (81 mg total) by mouth daily. 08/16/17   Jerrie Anger, PA  carvedilol  (COREG ) 25 MG tablet Take 1 tablet (25 mg total) by mouth 2 (two) times daily with a meal. 01/30/20   Lesches Dorn JINNY, MD  Cholecalciferol (VITAMIN D PO) Take 3,000 Units by mouth daily.  07/17/19   [provider]  escitalopram (LEXAPRO) 10 MG tablet Take 10 mg by mouth daily. 08/28/19   [provider]  ibuprofen  (ADVIL ) 400 MG tablet Take 1 tablet (400 mg total) by mouth every 8 (eight) hours as needed for moderate pain. Take 1 tablet tid for the first 3 days and then prn for severe pain 08/28/18   Charlyn Sora, MD  losartan  (COZAAR ) 50 MG tablet Take 1 tablet (50 mg total) by mouth daily. 01/30/20 04/29/20  Lesches Dorn JINNY, MD  nitroGLYCERIN  (NITROSTAT ) 0.4 MG SL tablet Place 0.4 mg under the tongue every 5 (five) minutes as needed for chest pain.    [provider]    Inpatient Medications: Scheduled Meds:  aspirin  EC  81 mg Oral Daily   carvedilol   6.25 mg Oral BID WC   enoxaparin  (LOVENOX ) injection  40 mg Subcutaneous Daily   pantoprazole   40 mg Oral Daily   Continuous Infusions:  PRN Meds: melatonin  Allergies:    Allergies  Allergen Reactions   Cortisone Anaphylaxis    Some forms of cortisone; patient cannot recall the reaction because it's been a long time since she's taken this medications   Crestor  [Rosuvastatin  Calcium ] Other (See Comments)    Memory loss for 3 months   Demerol [Meperidine] Anaphylaxis and Other (See Comments)    Extremely ill with N/V  and resp.   Monosodium Glutamate Anaphylaxis   Morphine And Codeine Anaphylaxis, Nausea And Vomiting and Other (See Comments)   Prednisone Anaphylaxis    confusion   Shellfish Allergy Anaphylaxis   Sulfa Antibiotics Anaphylaxis   Buprenorphine Hcl Nausea And Vomiting and Hypertension    SHAKING   Hctz [Hydrochlorothiazide ] Other (See Comments)    Dizziness    Readi-Cat [Barium Sulfate] Diarrhea    Patient does not want to drink Readi-Cat because it gives her diarrhea   Xanax [Alprazolam] Other (See Comments)    makes me crazy   Rosuvastatin  Other (See Comments)    Immediate Memory Loss    Benzoic Acid Nausea Only    Social History:   Social History   Socioeconomic History   Marital status: Widowed    Spouse name: Not on file   Number of children: 1   Years of education: Not on file   Highest education level: Not on file  Occupational History   Occupation: retired  Tobacco Use   Smoking status: Never   Smokeless tobacco: Never  Vaping Use   Vaping status: Never Used  Substance and Sexual Activity   Alcohol use: Yes    Alcohol/week: 0.0 standard drinks of alcohol    Comment: occasional    Drug use: No   Sexual activity: Not on file  Other Topics Concern   Not on file  Social History Narrative   ** Merged History Encounter **        Social Drivers of Corporate Investment Banker Strain: Not on file  Food Insecurity: Not on file  Transportation Needs: Not on file  Physical Activity: Not on file  Stress: Not on file  Social Connections: Not on file  Intimate Partner Violence: Not on file    Family History:   Family History  Problem Relation Age of Onset   Heart attack Father    Heart disease Mother    Alzheimer's disease Mother    Heart disease Brother    Colon cancer Maternal Grandfather    Stomach cancer Neg Hx    Rectal cancer Neg Hx    Esophageal cancer Neg Hx    Liver cancer Neg Hx      ROS:  Please see the history of present illness.  No fever, cough, hematuria. She notes black stools with some red mixed in on the day she had severe chest pain. All other ROS reviewed and negative.     Physical Exam/Data:   Vitals:   05/22/23 0631 05/22/23 0815 05/22/23 1000 05/22/23 1021  BP: 118/79 (!) 153/68 (!) 146/86   Pulse: 87 86 83   Resp: 18 20 (!) 21   Temp: 98.4 F (36.9 C)   98.3 F (36.8 C)  TempSrc:    Oral  SpO2: 99% 100% 98%   Weight:      Height:       No intake or output data in the 24 hours ending 05/22/23 1125    05/21/2023    7:12 PM 01/22/2020    2:28 PM 01/16/2020    2:39 PM  Last 3 Weights  Weight (lbs) 115 lb 143 lb 142 lb  Weight (kg) 52.164 kg 64.864 kg 64.411 kg     Body mass index is 19.14 kg/m.  General:  Well nourished, well developed, in no acute distress  HEENT: normal Neck: no JVD Vascular: No carotid bruits  Cardiac:  normal S1, S2; RRR; no murmur   Lungs:  clear to auscultation  bilaterally, no wheezing, rhonchi or rales  Abd: soft; mild epigastric tenderness to palpation.  Ext: no edema Musculoskeletal:  No deformities  Skin: warm and dry  Neuro:  CNs 2-12 intact, no focal abnormalities noted Psych:  Normal affect   EKG:  The EKG was personally reviewed and demonstrates:  see HPI.    Relevant CV Studies: Echocardiogram pending   Laboratory  Data:  High Sensitivity Troponin:   Recent Labs  Lab 05/21/23 1922 05/21/23 2119 05/22/23 0206 05/22/23 0524  TROPONINIHS 10 18* 39* 38*     Chemistry Recent Labs  Lab 05/21/23 1922 05/22/23 0257  NA 135 134*  K 3.5 3.5  CL 104 102  CO2 16* 20*  GLUCOSE 101* 133*  BUN 22 21  CREATININE 1.02* 0.86  CALCIUM  9.6 9.0  GFRNONAA 56* >60  ANIONGAP 15 12    Recent Labs  Lab 05/21/23 1922  PROT 7.6  ALBUMIN 4.2  AST 22  ALT 12  ALKPHOS 75  BILITOT 1.0   Lipids  Recent Labs  Lab 05/22/23 0257  CHOL 183  TRIG 70  HDL 46  LDLCALC 123*  CHOLHDL 4.0    Hematology Recent Labs  Lab 05/21/23 1922 05/22/23 0257  WBC 11.3* 8.4  RBC 4.92 4.64  HGB 9.9* 9.4*  HCT 35.1* 33.3*  MCV 71.3* 71.8*  MCH 20.1* 20.3*  MCHC 28.2* 28.2*  RDW 18.2* 18.1*  PLT 428* 335   Thyroid  No results for input(s): TSH, FREET4 in the last 168 hours.  BNPNo results for input(s): BNP, PROBNP in the last 168 hours.  DDimer No results for input(s): DDIMER in the last 168 hours.   Radiology/Studies:  CT Angio Chest/Abd/Pel for Dissection W and/or Wo Contrast Result Date: 05/21/2023 CLINICAL DATA:  Chest pain, evaluate for acute aortic syndrome EXAM: CT ANGIOGRAPHY CHEST, ABDOMEN AND PELVIS TECHNIQUE: Non-contrast CT of the chest was initially obtained. Multidetector CT imaging through the chest, abdomen and pelvis was performed using the standard protocol during bolus administration of intravenous contrast. Multiplanar reconstructed images and MIPs were obtained and reviewed to evaluate the vascular anatomy. RADIATION DOSE REDUCTION: This exam was performed according to the departmental dose-optimization program which includes automated exposure control, adjustment of the mA and/or kV according to patient size and/or use of iterative reconstruction technique. CONTRAST:  80mL OMNIPAQUE  IOHEXOL  350 MG/ML SOLN COMPARISON:  Chest radiograph dated 05/21/2023 FINDINGS: CTA CHEST FINDINGS  Cardiovascular: On unenhanced CT, there is no evidence of intramural hematoma. Following contrast administration, there is no evidence of thoracic aortic aneurysm or dissection. Atherosclerotic calcifications of the aortic arch. Although not tailored for evaluation of the pulmonary arteries, there is no evidence of pulmonary embolism to the lobar level. The heart is normal in size.  No pericardial effusion. Moderate coronary atherosclerosis of the LAD. Mediastinum/Nodes: No suspicious mediastinal lymphadenopathy. Visualized thyroid  is unremarkable. Lungs/Pleura: No suspicious pulmonary nodules. Chronic peribronchovascular nodularity at the left lung base, high density, possibly due to prior aspiration. No focal consolidation. Mild compressive atelectasis in the bilateral lower lobes. No pleural effusion or pneumothorax. Musculoskeletal: Mild degenerative changes of the mid thoracic spine. Review of the MIP images confirms the above findings. CTA ABDOMEN AND PELVIS FINDINGS VASCULAR Aorta: No evidence abdominal aortic aneurysm or dissection. Patent. Atherosclerotic calcifications. Celiac: Patent.  Atherosclerotic calcifications at the origin. SMA: Patent. Renals: Patent bilaterally. Atherosclerotic calcifications the origin on the left. IMA: Patent. Inflow: Patent bilaterally.  Atherosclerotic calcifications. Veins: Grossly unremarkable. Review of the MIP images confirms the above findings. NON-VASCULAR  Hepatobiliary: Liver is within normal limits. Gallbladder is unremarkable. No intrahepatic or extrahepatic duct dilatation. Pancreas: Within normal limits. Spleen: Within normal limits. Adrenals/Urinary Tract: Adrenal glands are within normal limits. Kidneys are within normal limits.  No hydronephrosis. Bladder is within normal limits. Stomach/Bowel: Large hiatal hernia/inverted intrathoracic stomach. No evidence of bowel obstruction. Normal appendix (series 6/image 254). Sigmoid diverticulosis, without evidence of  diverticulitis. Lymphatic: No suspicious abdominopelvic lymphadenopathy. Reproductive: Status post hysterectomy. No adnexal masses. Other: No abdominopelvic ascites. Musculoskeletal: Lumbar spine is within normal limits. Status post ORIF of the right hip. Review of the MIP images confirms the above findings. IMPRESSION: No evidence of thoracic or abdominal aortic aneurysm or dissection. No evidence of pulmonary embolism. Large hiatal hernia/inverted intrathoracic stomach. Sigmoid diverticulosis, without evidence of diverticulitis. Aortic Atherosclerosis (ICD10-I70.0). Electronically Signed   By: Pinkie Pebbles M.D.   On: 05/21/2023 23:14   DG Chest 2 View Result Date: 05/21/2023 CLINICAL DATA:  Chest pain EXAM: CHEST - 2 VIEW COMPARISON:  04/17/2021 FINDINGS: The heart size and mediastinal contours are within normal limits. Aortic atherosclerosis. Both lungs are clear. The visualized skeletal structures are unremarkable. Moderate to large hiatal hernia IMPRESSION: No active cardiopulmonary disease. Moderate to large hiatal hernia. Electronically Signed   By: Luke Bun M.D.   On: 05/21/2023 19:50     Assessment and Plan:   1. Chest pain 2. Coronary artery disease  Hx of NSTEMI in 2018 tx with DES to LAD and NSTEMI in 2019 tx with DES x 2 to LAD. She has a know jailed diagonal. Cardiac catheterization in 11/2017 demonstrated patent stents and stable anatomy. Myoview  in 12/2017 was low risk. She now presents with recurrent chest pain after eating. She has a large hiatal hernia on CXR. CT neg for dissection. Her hsTroponins are minimally elevated. There has been some uptrend, but they are essentially flat. Her EKG does not show injury or ischemia. I suspect her symptoms are all related to her GERD/hiatal hernia. The minimal troponin elevation may be due to demand ischemia in the setting of anemia. She really is not a good candidate for invasive CV eval until m/c anemia is addressed. Would try to manage  her medically unless she has a change in symptoms, has new wall motion abnormalities or ischemic EKG changes.  -Await echocardiogram  -Continue ASA 81 mg once daily, Coreg  6.25 mg twice daily -She is intol of nitrates due to HA. Consider Amlodipine  or Ranolazine for antianginal Rx -She is intol of statins, PCSK9 inhibs  3. Hypertension  BP uncontrolled. She is on coreg  25 mg twice daily, losartan  50 mg once daily at home. She is now on Coreg  6.25 mg twice daily. Consider Amlodipine  if BP remains high (can use as antianginal as well).   4. Hyperlipidemia  Intol of statins, PCSK9 inhibitors. LDL above goal. Goal is < 55. She is not on any meds. Would be reasonable to see Lipid Clinic after DC to see if she is a candidate for Leqvio.  5. Microcytic anemia Hgb usually around 11 (2022). Now Hgb is down to 9.4. She notes some ?melena/hematochezia. Per IM.    Risk Assessment/Risk Scores:     For questions or updates, please contact [facility] Please consult [email or web address] for contact info under   Signed, Glendia Ferrier, PA-C  05/22/2023 11:25 AM  Patient seen and examined, note reviewed with the signed Advanced Practice Provider. I personally reviewed laboratory data, imaging studies and relevant notes. I independently examined the patient and formulated  the important aspects of the plan. I have personally discussed the plan with the patient and/or family. Comments or changes to the note/plan are indicated below.  Atypical chest pain History of coronary artery disease status post NSTEMI with multiple PCI's in the past Hypertension Hyperlipidemia   Chest pain does sound atypical, mostly supporting GI etiology.  Doubt this is cardiac in nature.  But we have ordered the echocardiogram with 2 rule out any wall motion abnormalities. In terms of antianginal she has not tolerated nitrates in the past.  Will plan to give her low-dose amlodipine  5 mg daily to her regimen given that she is  also hypertensive.  Please also restart her home antihypertensive medication. She has not been able to tolerate statins or PCSK9 inhibitors.  She would be a good patient in outpatient setting to be considered for inclisiran. If echo is normal from a cardiovascular standpoint I would not pursue any ischemic evaluation. She would benefit from being evaluated by gastroenterology as well.  Azzie Thiem DO, MS [facility] Attending Cardiologist [facility]  [address] [place], [place] [address] [phone] Website: [email or web address]

## 2023-05-22 NOTE — Assessment & Plan Note (Addendum)
 Extensive history described below and now with mildly elevated troponin and junctional rhythm on EKG. Hx NSTEMI.  CAD s/p cardiac cath by Dr. Court 05/30/2016 revealing 90% mild LAD stenosis. Drug-eluting stent placed in 2018.  Catheterization  #2 by Dr. Verlin 08/14/2017 revealing progression of disease in the proximal LAD, restented.  EF 35-40%.   Catheterization #3 on 11/28/2017 revealing patent stents with an EF that had normalized. Myoview  in 12/24/2017 was nonischemic. Currently her chest pain resolved but does have epigastric pain.  Troponin slightly up trended from 10>38.  With history of MI and HLD, LDL goal <70-LDL 123 on last. A1c 6.0. - Trend troponin -  Should be on a statin however has been intolerant to PCSK9i and statin therapy in the past.  Has previously been referred to lipid clinic for further evaluation by Dr. Abran (cardiology). PA was attempted for Repatha  in the past. - Consider re-establishing with cardiology outpatient since she hasn't seen a cardiologist since 2021 w/ such extensive hx -F/u Echo - Restarted ASA 81 mg daily

## 2023-05-22 NOTE — Assessment & Plan Note (Addendum)
 Hgb 9.9 and 9.4 today. Microcytic. Hx of GI bleed and IDA, diverticulosis. Last colonoscopy 2018 no active bleeding. Stated at that time did not want any more colonoscopies.  -CBC -Consider GI consult if ferritin and occult blood positive

## 2023-05-22 NOTE — Assessment & Plan Note (Addendum)
 BP elevated in the 120-160s/50s-100s. Likely secondary to pain vs not taking her medications today. Previously on Coreg  25 mg BID and Losartan  50 mg daily with her old PCP. She hasn't seen a PCP since 2022, assigned a new PCP Dr. Dorn Sauers.  - CTM, add anti-hypertensives as indicated  - Establish follow up with PCP Dr. Sauers [facility] IM)

## 2023-05-22 NOTE — Discharge Instructions (Signed)
 Dear Curlie GORMAN Puff,   Thank you so much for allowing us  to be part of your care!  You were admitted to [facility] for abdominal pain and chest pain. The heart doctors saw you and your tests run look normal. They would like you to follow up with them. I recommend following up with GI doctors for your abdominal pain.   POST-HOSPITAL & CARE INSTRUCTIONS Follow up with PCP, GI and Cardiology Please let PCP/Specialists know of any changes that were made.  Please see medications section of this packet for any medication changes.   DOCTOR'S APPOINTMENT & FOLLOW UP CARE INSTRUCTIONS  Future Appointments  Date Time Provider Department Center  06/23/2023 10:00 AM Court Dorn PARAS, MD [facility] None    RETURN PRECAUTIONS: Worsening abdominal pain or chest pain  Take care and be well!  Family Medicine Teaching Service  Interior  [facility]  [address] [place], [place] [address] [phone]

## 2023-06-23 ENCOUNTER — Encounter: Payer: Self-pay | Admitting: Cardiovascular Disease

## 2023-06-23 ENCOUNTER — Ambulatory Visit: Payer: Medicare Other | Attending: Cardiovascular Disease | Admitting: Cardiovascular Disease

## 2023-06-23 VITALS — BP 156/80 | HR 99 | Ht 64.0 in | Wt 129.2 lb

## 2023-06-23 DIAGNOSIS — Z0000 Encounter for general adult medical examination without abnormal findings: Secondary | ICD-10-CM | POA: Diagnosis not present

## 2023-06-23 DIAGNOSIS — Z955 Presence of coronary angioplasty implant and graft: Secondary | ICD-10-CM | POA: Diagnosis not present

## 2023-06-23 DIAGNOSIS — D509 Iron deficiency anemia, unspecified: Secondary | ICD-10-CM | POA: Diagnosis not present

## 2023-06-23 DIAGNOSIS — I2510 Atherosclerotic heart disease of native coronary artery without angina pectoris: Secondary | ICD-10-CM | POA: Diagnosis not present

## 2023-06-23 DIAGNOSIS — I255 Ischemic cardiomyopathy: Secondary | ICD-10-CM | POA: Diagnosis not present

## 2023-06-23 DIAGNOSIS — E785 Hyperlipidemia, unspecified: Secondary | ICD-10-CM | POA: Diagnosis not present

## 2023-06-23 DIAGNOSIS — I10 Essential (primary) hypertension: Secondary | ICD-10-CM

## 2023-06-23 DIAGNOSIS — E559 Vitamin D deficiency, unspecified: Secondary | ICD-10-CM | POA: Diagnosis not present

## 2023-06-23 DIAGNOSIS — E039 Hypothyroidism, unspecified: Secondary | ICD-10-CM | POA: Diagnosis not present

## 2023-06-23 DIAGNOSIS — F329 Major depressive disorder, single episode, unspecified: Secondary | ICD-10-CM | POA: Diagnosis not present

## 2023-06-23 MED ORDER — CARVEDILOL 6.25 MG PO TABS: 6.2500 mg | ORAL_TABLET | Freq: Two times a day (BID) | ORAL | 3 refills | Status: DC

## 2023-06-23 MED ORDER — AMLODIPINE BESYLATE 5 MG PO TABS: 5.0000 mg | ORAL_TABLET | Freq: Every day | ORAL | 3 refills | Status: DC

## 2023-06-23 MED ORDER — ASPIRIN 81 MG PO TBEC: 81.0000 mg | DELAYED_RELEASE_TABLET | Freq: Every day | ORAL | Status: DC

## 2023-06-23 NOTE — Assessment & Plan Note (Signed)
History of ischemic cardiomyopathy in the past with an EF in the 35 to 40% range with recent echo performed 05/22/2023 revealing normal LV systolic function with mild asymmetric septal hypertrophy and grade 1 diastolic dysfunction.

## 2023-06-23 NOTE — Assessment & Plan Note (Signed)
History of CAD status post cardiac cath by myself in 2002 revealing normal coronary arteries.  She had an LAD stent which I placed 06/01/2016 in her mid LAD with a Synergy drug-eluting stent.  She had a non-STEMI 08/13/17 and had catheterization by Dr. Clifton James revealing a 90% proximal LAD lesion and a 70% lesion just proximal to the previously placed stent both of which were stented with Synergy drug-eluting stents.  Her most recent cath performed by Dr. Katrinka Blazing 11/28/2017 revealed patent stents and a small jailed diagonal branch with otherwise noncritical CAD.  She had a Myoview performed 12/24/2017 which is low risk and nonischemic.  She denies chest pain.

## 2023-06-23 NOTE — Progress Notes (Signed)
06/23/2023 Caitlyn [name]   08/17/1945  [id number]  Primary Physician Emilio Aspen, MD Primary Cardiologist: Runell Gess MD [name], [place]  HPI:  Caitlyn [name] is a 78 y.o.   mildly overweight Caucasian female who I been taking care of for many years. I last saw in the office 01/22/2020.  She is accompanied by her younger sister Caitlyn [name] who drove up from [place].  I did perform coronary angiography on her in 2002 demonstrating normal coronary arteries. She does have a strong family history of heart disease. She apparently fell and broke her hip on the ice several days ago and was admitted to the hospital with an intention to perform ORIF of her right hip by Dr. Linna Caprice . She has had several episodes of chest pain with mildly elevated troponin. I performed cardiac catheterization on her 06/01/16 revealing a 90% mid LAD lesion which I stented with a Synergy drug-eluting stent with an excellent result. She underwent right ORIF for fractured hip one week later by Dr. Linna Caprice on dual antiplatelet therapy. She underwent physical therapy and rehabilitation.   She was doing well until 08/13/2017 when she developed chest pain was brought to [facility].  She had a "non-STEMI with a very low troponin rise and minimal ST segment changes.  The following day, 08/14/2017, she underwent a radial cath by Dr. Clifton James revealing a 90% proximal LAD lesion, and a 70% lesion just proximal to the previous he placed mid LAD stent both of which were stented with synergy drug-eluting stents.  Her EF was in the 35 to 40% range, significantly lower than it had been previously which I 6 suspect was related to "stunned myocardium".  She does complain of some mild shortness of breath.   She was admitted to the hospital 11/28/2017 with unstable angina and underwent cardiac catheterization radially by Dr. Katrinka Blazing the following day revealing patent LAD stents, a jailed small diffusely diseased first  diagonal branch with otherwise noncritical CAD and normal LV function.  A subsequent Myoview stress test performed 12/24/2017 was low risk and nonischemic ischemic.  She saw Corine Shelter in the office 12/09/2017 who prescribed low-dose Imdur which she failed to take because of fear of headaches.  She also has not been taking her carvedilol as prescribed.   Since I saw  her in the office 3-1/2 years ago she was admitted to the hospital in January with epigastric pain and chest pain.  Her enzymes were fairly low and flat.  She was seen by Dr. Servando Salina  who did not feel that her symptoms were cardiac in nature.  She has had no recurrent symptoms.   Current Meds  Medication Sig   acetaminophen (TYLENOL) 500 MG tablet Take 500 mg by mouth every 6 (six) hours as needed for moderate pain (pain score 4-6).     Allergies  Allergen Reactions   Cortisone Anaphylaxis    Some forms of cortisone; patient cannot recall the reaction because it's been a long time since she's taken this medications   Crestor [Rosuvastatin Calcium] Other (See Comments)    Memory loss for 3 months   Demerol [Meperidine] Anaphylaxis and Other (See Comments)    Extremely ill with N/V and resp.   Monosodium Glutamate Anaphylaxis   Morphine And Codeine Anaphylaxis, Nausea And Vomiting and Other (See Comments)   Prednisone Anaphylaxis    confusion   Shellfish Allergy Anaphylaxis   Sulfa Antibiotics Anaphylaxis   Buprenorphine Hcl Nausea And  Vomiting and Hypertension    SHAKING   Hctz [Hydrochlorothiazide] Other (See Comments)    Dizziness    Readi-Cat [Barium Sulfate] Diarrhea    Patient does not want to drink Readi-Cat because it gives her diarrhea   Xanax [Alprazolam] Other (See Comments)    "makes me crazy"   Rosuvastatin Other (See Comments)    Immediate Memory Loss    Benzoic Acid Nausea Only    Social History   Socioeconomic History   Marital status: Widowed    Spouse name: Not on file   Number of children: 1   Years  of education: Not on file   Highest education level: Not on file  Occupational History   Occupation: retired  Tobacco Use   Smoking status: Never   Smokeless tobacco: Never  Vaping Use   Vaping status: Never Used  Substance and Sexual Activity   Alcohol use: Yes    Alcohol/week: 0.0 standard drinks of alcohol    Comment: occasional    Drug use: No   Sexual activity: Not on file  Other Topics Concern   Not on file  Social History Narrative   ** Merged History Encounter **       Social Drivers of Corporate investment banker Strain: Not on file  Food Insecurity: Not on file  Transportation Needs: Not on file  Physical Activity: Not on file  Stress: Not on file  Social Connections: Not on file  Intimate Partner Violence: Not on file     Review of Systems: General: negative for chills, fever, night sweats or weight changes.  Cardiovascular: negative for chest pain, dyspnea on exertion, edema, orthopnea, palpitations, paroxysmal nocturnal dyspnea or shortness of breath Dermatological: negative for rash Respiratory: negative for cough or wheezing Urologic: negative for hematuria Abdominal: negative for nausea, vomiting, diarrhea, bright red blood per rectum, melena, or hematemesis Neurologic: negative for visual changes, syncope, or dizziness All other systems reviewed and are otherwise negative except as noted above.    Blood pressure (!) 156/80, pulse 99, height 5\' 4"  (1.626 m), weight 129 lb 3.2 oz (58.6 kg), SpO2 99%.  General appearance: alert and no distress Neck: no adenopathy, no carotid bruit, no JVD, supple, symmetrical, trachea midline, and thyroid not enlarged, symmetric, no tenderness/mass/nodules Lungs: clear to auscultation bilaterally Heart: regular rate and rhythm, S1, S2 normal, no murmur, click, rub or gallop Extremities: extremities normal, atraumatic, no cyanosis or edema Pulses: 2+ and symmetric Skin: Skin color, texture, turgor normal. No rashes or  lesions Neurologic: Grossly normal  EKG not performed today      ASSESSMENT AND PLAN:   Essential hypertension History of essential hypertension with blood pressure measured today at 156/80.  She was on amlodipine, carvedilol and losartan prior to her recent hospitalization.  The losartan was discontinued and for semination she has been off her amlodipine and carvedilol which we will restart.  Dyslipidemia, goal LDL below 70 History of dyslipidemia intolerant to statin therapy and PCSK9's.  Her most recent lipid profile performed 05/22/2023 revealed a total cholesterol 183, LDL 123 and HDL 46.  She is not at goal for secondary prevention.  I am going to refer her to advanced lipid clinic to see Dr. Rennis Golden to discuss other pharmacologic alternatives such as Nexlizet.   Status post insertion of drug-eluting stent into left anterior descending (LAD) artery History of CAD status post cardiac cath by myself in 2002 revealing normal coronary arteries.  She had an LAD stent which I placed 06/01/2016  in her mid LAD with a Synergy drug-eluting stent.  She had a non-STEMI 08/13/17 and had catheterization by Dr. Clifton James revealing a 90% proximal LAD lesion and a 70% lesion just proximal to the previously placed stent both of which were stented with Synergy drug-eluting stents.  Her most recent cath performed by Dr. Katrinka Blazing 11/28/2017 revealed patent stents and a small jailed diagonal branch with otherwise noncritical CAD.  She had a Myoview performed 12/24/2017 which is low risk and nonischemic.  She denies chest pain.  Ischemic cardiomyopathy History of ischemic cardiomyopathy in the past with an EF in the 35 to 40% range with recent echo performed 05/22/2023 revealing normal LV systolic function with mild asymmetric septal hypertrophy and grade 1 diastolic dysfunction.     Runell Gess MD FACP,FACC,FAHA, [facility] 06/23/2023 11:05 AM

## 2023-06-23 NOTE — Assessment & Plan Note (Signed)
History of essential hypertension with blood pressure measured today at 156/80.  She was on amlodipine, carvedilol and losartan prior to her recent hospitalization.  The losartan was discontinued and for semination she has been off her amlodipine and carvedilol which we will restart.

## 2023-06-23 NOTE — Patient Instructions (Addendum)
Medication Instructions:  Your physician has recommended you make the following change in your medication:   -Take amlodipine (norvasc) 5mg  once daily.  -Take carvedilol (coreg) 6.25mg  twice daily.  *If you need a refill on your cardiac medications before your next appointment, please call your pharmacy*   Follow-Up: At [facility], you and your health needs are our priority.  As part of our continuing mission to provide you with exceptional heart care, we have created designated Provider Care Teams.  These Care Teams include your primary Cardiologist (physician) and Advanced Practice Providers (APPs -  Physician Assistants and Nurse Practitioners) who all work together to provide you with the care you need, when you need it.  We recommend signing up for the patient portal called "MyChart".  Sign up information is provided on this After Visit Summary.  MyChart is used to connect with patients for Virtual Visits (Telemedicine).  Patients are able to view lab/test results, encounter notes, upcoming appointments, etc.  Non-urgent messages can be sent to your provider as well.   To learn more about what you can do with MyChart, go to [email or web address].    Your next appointment:   3 month(s)  Provider:   Marjie Skiff, PA-C, Robet Leu, PA-C, Azalee Course, PA-C, Bernadene Person, NP, or Reather Littler, NP       Then, Nanetta Batty, MD will plan to see you again in 12 month(s).    Other Instructions

## 2023-06-23 NOTE — Assessment & Plan Note (Signed)
History of dyslipidemia intolerant to statin therapy and PCSK9's.  Her most recent lipid profile performed 05/22/2023 revealed a total cholesterol 183, LDL 123 and HDL 46.  She is not at goal for secondary prevention.  I am going to refer her to advanced lipid clinic to see Dr. Rennis Golden to discuss other pharmacologic alternatives such as Nexlizet.

## 2023-06-29 DIAGNOSIS — K921 Melena: Secondary | ICD-10-CM | POA: Diagnosis not present

## 2023-06-29 DIAGNOSIS — D649 Anemia, unspecified: Secondary | ICD-10-CM | POA: Diagnosis not present

## 2023-07-13 DIAGNOSIS — E039 Hypothyroidism, unspecified: Secondary | ICD-10-CM | POA: Diagnosis not present

## 2023-07-13 DIAGNOSIS — E559 Vitamin D deficiency, unspecified: Secondary | ICD-10-CM | POA: Diagnosis not present

## 2023-07-13 DIAGNOSIS — I10 Essential (primary) hypertension: Secondary | ICD-10-CM | POA: Diagnosis not present

## 2023-07-13 DIAGNOSIS — E785 Hyperlipidemia, unspecified: Secondary | ICD-10-CM | POA: Diagnosis not present

## 2023-09-11 NOTE — Progress Notes (Deleted)
 Cardiology Office Note:    Date:  09/11/2023   ID:  Caitlyn [name], DOB Sep 16, 1945, MRN [id number]  PCP:  Benedetta Bradley, MD  Cardiologist:  Lauro Portal, MD { Click to update primary MD,subspecialty MD or APP then REFRESH:1}    Referring MD: Benedetta Bradley, *   Chief Complaint: follow-up of CAD, ischemic cardiomyopathy, and hypertension  History of Present Illness:    Caitlyn [name] is a 78 y.o. female with a history of CAD with NSTEMI in 05/2016 s/p DES to mid LAD and another NSTEMI in 08/2017 s/p DES to proximal LAD and DES to mid LAD (overlapping previously placed stent),  ischemic cardiomyopathy with EF of 35-40% in 08/2017 with improvement to 55-60% in 10/2017 , hypertension, hyperlipidemia, GERD, and iron  deficiency anemia who is followed by Dr. [name] and presents today for routine follow-up.  Patient was admitted in 05/2017 for a hip fracture. Initial plan was for surgery; however, she had multiple episodes of chest pain and was noted to have an elevated troponin. Echo showed LVEF of 60-65% with normal wall motion. LHC showed 90% stenosis of mid LAD and otherwise only mild disease of the RCA. She underwent successful PCI with DES to LAD. She underwent right ORIF of her hip fracture one week later while on DAPT. She was admitted again in 08/2017 for NSTEMI. Echo showed LVEF of 35-40% with akinesis of the apical myocardium and grade 1 diastolic dysfunction.LHC showed 99% stenosis of proximal to LAD and 70% stenosis of mid LAD prior to previously placed stent with otherwise only mild to moderate disease. She underwent successful PCI with DES to both the proximal and mid LAD with the mid LAD stent overlapping the previously placed stent in 2018. Repeat Echo in 10/2017 showed normalization of EF to 55-60% with no regional wall motion abnormalities. She was readmitted in 11/2017 for unstable angina. LHC showed patent LAD stents with a jailed 1st Diag and diffuse disease in that vessel.  Medical therapy was recommended. Myoview  in 12/2017 was low risk with no evidence of ischemia.   She was admitted in 05/2023 for epigastric/ chest pain. High-sensitivity troponin was minimally elevated and flat peaking at 39. Echo showed LVEF of 60-65% with normal wall motion, mild asymmetric LVH of the septal segment, and grade 1 diastolic dysfunction. She was seen by Cardiology and symptoms were not felt to be cardiac in nature. She was started on Amlodipine  and Protonix .   She was last seen by Dr. [name] in 06/2023 at which time she denied any recurrent epigastric/ chest pain. However, her BP was elevated. However, she had not been taking her medications. She was restarted on Amlodipine  and Coreg .   Patient presents today for follow-up. ***  CAD S/p multiple PCI/ DES to proximal to mid LAD, most recently in 08/2017 in setting of NSTEMI. Last LHC in 11/2017 showed patent stents with a jailed 1st Diag but no significant changes compared to prior cath. Medical therapy was recommended. Myoview  in 12/2017 was low risk with no ischemia. - No chest pain. *** - Continue Coreg  6.25mg  twice daily and Amlodipine  5mg  daily. - Continue Aspirin  81mg  daily.  - Intolerant to statins in the past (had memory loss with Crestor ).  Ischemic Cardiomyopathy EF as low as 35-40% in 08/2017 in setting of NSTEMI but EF quickly normalized by 10/2017. Echo in 05/2023 showed  60-65% with normal wall motion, mild asymmetric LVH of the septal segment, and grade 1 diastolic dysfunction. - Euvolemic on exam.  -  Continue Coreg  6.25mg  twice daily.  - Previously on Losartan . ***  Hypertension BP *** - Continue current medications: Coreg  6.25mg  twice daily and Amlodipine  5mg  daily.   Hyperlipidemia Lipid panel in 05/2023: Total Cholesterol 183, Triglycerides 70, HDL 46, LDL 123. LDL goal <55 given multiple NSTEMIs in the past. - Intolerant to statins. - Dr. [name] referred her to the [facility] at last visit for  pharmacologic alternatives. However, it looks like this referral was closed (unclear why). Will start Zetia 10mg  daily and then can switch to Nexlizet if needed. *** - Will repeat lipid panel and LFTs in 3 months.   EKGs/Labs/Other Studies Reviewed:    The following studies were reviewed:  Left Cardiac Catheterization 11/29/2017: Right dominant coronary anatomy. Widely patent right coronary Widely patent left main with superior angulation from the left sinus of Valsalva.  No obstructive disease. Widely patent proximal and overlapping mid LAD stents.  No significant obstruction is noted in the LAD.  The jailed first diagonal contains proximal eccentric 50% narrowing and mid eccentric 60- 80% narrowing.  Diagonal disease is unchanged compared to the prior images. Tortuous circumflex with 2 large obtuse marginal branches.  Each obtuse marginal contains proximal to mid segmental 50 to 60% narrowing followed by tortuosity.  Smaller third and fourth obtuse marginal branches are widely patent. Normal left ventricular function with EF 55%.  Normal LV filling pressures with LVEDP meters mercury.   Recommendations: Widely patent LAD stents.  No significant change in anatomy compared to April post stent images.  The first diagonal contains diffuse disease and could be the source of angina.  This vessel is now jailed, contains disease over a long segment, and management should be medical therapy. Consider alternative explanations for the patient's presenting symptoms.   Recommend uninterrupted dual antiplatelet therapy with Aspirin  81mg  daily and Clopidogrel  75mg  daily for a minimum of 12 months (ACS - Class I recommendation).  Diagnostic Dominance: Right  _______________  Myoview  12/24/2017: Less than 1 mm ST depression inferiorly at end of recovery, not significant for ischemia;, electrically negative for ischemia Nuclear stress EF: 67%. Myovue with normal perfusion Low risk  study _______________  Echocardiogram 05/22/2023: Impressions: 1. Left ventricular ejection fraction, by estimation, is 60 to 65%. The  left ventricle has normal function. The left ventricle has no regional  wall motion abnormalities. There is mild asymmetric left ventricular  hypertrophy of the septal segment. Left  ventricular diastolic parameters are consistent with Grade I diastolic  dysfunction (impaired relaxation).   2. Right ventricular systolic function is normal. The right ventricular  size is normal.   3. The mitral valve is normal in structure. No evidence of mitral valve  regurgitation. No evidence of mitral stenosis.   4. The aortic valve is normal in structure. Aortic valve regurgitation is  not visualized. No aortic stenosis is present.   5. The inferior vena cava is normal in size with greater than 50%  respiratory variability, suggesting right atrial pressure of 3 mmHg.   EKG:  EKG not ordered today.   Recent Labs: 05/21/2023: ALT 12 05/22/2023: BUN 21; Creatinine, Ser 0.86; Hemoglobin 9.4; Platelets 335; Potassium 3.5; Sodium 134  Recent Lipid Panel    Component Value Date/Time   CHOL 183 05/22/2023 0257   CHOL 262 (H) 11/16/2019 1209   TRIG 70 05/22/2023 0257   HDL 46 05/22/2023 0257   HDL 44 11/16/2019 1209   CHOLHDL 4.0 05/22/2023 0257   VLDL 14 05/22/2023 0257   LDLCALC 123 (  H) 05/22/2023 0257   LDLCALC 174 (H) 11/16/2019 1209    Physical Exam:    Vital Signs: There were no vitals taken for this visit.    Wt Readings from Last 3 Encounters:  06/23/23 129 lb 3.2 oz (58.6 kg)  05/21/23 115 lb (52.2 kg)  01/22/20 143 lb (64.9 kg)     General: 78 y.o. female in no acute distress. HEENT: Normocephalic and atraumatic. Sclera clear.  Neck: Supple. No carotid bruits. No JVD. Heart: *** RRR. Distinct S1 and S2. No murmurs, gallops, or rubs.  Lungs: No increased work of breathing. Clear to ausculation bilaterally. No wheezes, rhonchi, or rales.   Abdomen: Soft, non-distended, and non-tender to palpation.  Extremities: No lower extremity edema.  Radial and distal pedal pulses 2+ and equal bilaterally. Skin: Warm and dry. Neuro: No focal deficits. Psych: Normal affect. Responds appropriately.   Assessment:    No diagnosis found.  Plan:     Disposition: Follow up in ***   Signed, Casimer Clear, PA-C  09/11/2023 11:49 AM    [facility]

## 2023-09-20 NOTE — Progress Notes (Deleted)
  Cardiology Office Note:  .   Date:  09/21/2023  ID:  Caitlyn [name], DOB 1946-05-03, MRN [id number] PCP: Benedetta Bradley, MD  [facility] Providers Cardiologist:  Lauro Portal, MD {  History of Present Illness: .   Caitlyn [name] is a 78 y.o. female with history of CAD with DES to mid LAD, DES x 2 mid/proximal LAD 05/2016, hypertension, hyperlipidemia (intolerant to statins/PCSK9).  Cardiac catheterization January 2018 with 90% mid LAD lesion, successful DES.  April 2019 cath with 90% proximal LAD lesion and 70% lesion just proximal to the previously placed mid LAD stent x 2.  Visually EF reduced on cardiac catheterization but on surface echocardiogram normalized possibly related to stunned myocardium.  Last cardiac catheterization July 2019 for unstable angina, patent LAD stents, jailed small diffusely diseased first diagonal branch with nonflow limiting CAD.  Then had low risk Myoview  12/2017.  She had a gap in care with cardiology from 2021 to February 2025 where she was seen.  Had a recent hospital admission for epigastric/chest pain with flat and low troponins.  Symptoms felt not to be related and had resolved.  She was doing great, no complaints of chest pain at last visit.  CAD with multiple PCI's to LAD Had DES to the mid LAD 2018.  DES x 2 to proximal/mid LAD 08/2017.  Most recent cardiac catheterization in July 2019 showing patent LAD stents.  There was disease in the first diagonal but the vessel is jailed so medical therapy recommended.  Hypertension  Hyperlipidemia Followed by advanced lipid clinic with Dr. Maximo Spar.  Ischemic cardiomyopathy Echo in April 2019 with EF 35 to 40%, has since normalized and reiterated on serial echocardiograms with the last being in January 2025.  Likely related to stunned myocardium.        ROS: Denies: Chest pain, shortness of breath, orthopnea, peripheral edema, palpitations, decreased exercise intolerance, fatigue,  lightheadedness.   Studies Reviewed: .     Risk Assessment/Calculations:   {Does this patient have ATRIAL FIBRILLATION?:[phone]} No BP recorded.  {Refresh Note OR Click here to enter BP  :1}***       Physical Exam:   VS:  There were no vitals taken for this visit.   Wt Readings from Last 3 Encounters:  06/23/23 129 lb 3.2 oz (58.6 kg)  05/21/23 115 lb (52.2 kg)  01/22/20 143 lb (64.9 kg)    GEN: Well nourished, well developed in no acute distress NECK: No JVD; No carotid bruits CARDIAC: ***RRR, no murmurs, rubs, gallops RESPIRATORY:  Clear to auscultation without rales, wheezing or rhonchi  ABDOMEN: Soft, non-tender, non-distended EXTREMITIES:  No edema; No deformity   ASSESSMENT AND PLAN: .         {Are you ordering a CV Procedure (e.g. stress test, cath, DCCV, TEE, etc)?   Press F2        :[id number]}  Dispo: ***  Signed, Burnetta Cart, PA-C

## 2023-09-21 ENCOUNTER — Ambulatory Visit: Payer: Medicare Other | Attending: Student | Admitting: Student

## 2023-09-22 ENCOUNTER — Encounter: Payer: Self-pay | Admitting: Student

## 2023-12-19 ENCOUNTER — Encounter (HOSPITAL_COMMUNITY): Payer: Self-pay | Admitting: Emergency Medicine

## 2023-12-19 ENCOUNTER — Emergency Department (HOSPITAL_COMMUNITY)

## 2023-12-19 ENCOUNTER — Inpatient Hospital Stay (HOSPITAL_COMMUNITY)
Admission: EM | Admit: 2023-12-19 | Discharge: 2024-01-03 | DRG: 689 | Disposition: A | Discharge status: Skilled Nursing Facility | Attending: Internal Medicine | Admitting: Internal Medicine

## 2023-12-19 ENCOUNTER — Other Ambulatory Visit: Payer: Self-pay

## 2023-12-19 DIAGNOSIS — Z820 Family history of epilepsy and other diseases of the nervous system: Secondary | ICD-10-CM | POA: Diagnosis not present

## 2023-12-19 DIAGNOSIS — N390 Urinary tract infection, site not specified: Secondary | ICD-10-CM

## 2023-12-19 DIAGNOSIS — I2510 Atherosclerotic heart disease of native coronary artery without angina pectoris: Secondary | ICD-10-CM | POA: Diagnosis present

## 2023-12-19 DIAGNOSIS — F0390 Unspecified dementia without behavioral disturbance: Secondary | ICD-10-CM

## 2023-12-19 DIAGNOSIS — Z800 Family history of malignant neoplasm of digestive organs: Secondary | ICD-10-CM | POA: Diagnosis not present

## 2023-12-19 DIAGNOSIS — G308 Other Alzheimer's disease: Secondary | ICD-10-CM | POA: Diagnosis not present

## 2023-12-19 DIAGNOSIS — Z8249 Family history of ischemic heart disease and other diseases of the circulatory system: Secondary | ICD-10-CM

## 2023-12-19 DIAGNOSIS — Z885 Allergy status to narcotic agent status: Secondary | ICD-10-CM

## 2023-12-19 DIAGNOSIS — Z888 Allergy status to other drugs, medicaments and biological substances status: Secondary | ICD-10-CM

## 2023-12-19 DIAGNOSIS — F03918 Unspecified dementia, unspecified severity, with other behavioral disturbance: Principal | ICD-10-CM | POA: Diagnosis present

## 2023-12-19 DIAGNOSIS — G9341 Metabolic encephalopathy: Secondary | ICD-10-CM | POA: Diagnosis present

## 2023-12-19 DIAGNOSIS — Z882 Allergy status to sulfonamides status: Secondary | ICD-10-CM | POA: Diagnosis not present

## 2023-12-19 DIAGNOSIS — I10 Essential (primary) hypertension: Secondary | ICD-10-CM | POA: Diagnosis not present

## 2023-12-19 DIAGNOSIS — R410 Disorientation, unspecified: Secondary | ICD-10-CM | POA: Diagnosis not present

## 2023-12-19 DIAGNOSIS — Z91013 Allergy to seafood: Secondary | ICD-10-CM | POA: Diagnosis not present

## 2023-12-19 DIAGNOSIS — B9620 Unspecified Escherichia coli [E. coli] as the cause of diseases classified elsewhere: Secondary | ICD-10-CM | POA: Diagnosis not present

## 2023-12-19 DIAGNOSIS — Z955 Presence of coronary angioplasty implant and graft: Secondary | ICD-10-CM

## 2023-12-19 DIAGNOSIS — F0280 Dementia in other diseases classified elsewhere without behavioral disturbance: Secondary | ICD-10-CM | POA: Diagnosis not present

## 2023-12-19 DIAGNOSIS — R4182 Altered mental status, unspecified: Secondary | ICD-10-CM | POA: Diagnosis not present

## 2023-12-19 DIAGNOSIS — E876 Hypokalemia: Secondary | ICD-10-CM | POA: Diagnosis present

## 2023-12-19 DIAGNOSIS — F02B18 Dementia in other diseases classified elsewhere, moderate, with other behavioral disturbance: Secondary | ICD-10-CM | POA: Diagnosis present

## 2023-12-19 DIAGNOSIS — Z881 Allergy status to other antibiotic agents status: Secondary | ICD-10-CM | POA: Diagnosis not present

## 2023-12-19 DIAGNOSIS — Z79899 Other long term (current) drug therapy: Secondary | ICD-10-CM

## 2023-12-19 DIAGNOSIS — G309 Alzheimer's disease, unspecified: Secondary | ICD-10-CM | POA: Diagnosis present

## 2023-12-19 DIAGNOSIS — M1990 Unspecified osteoarthritis, unspecified site: Secondary | ICD-10-CM | POA: Diagnosis not present

## 2023-12-19 DIAGNOSIS — N3000 Acute cystitis without hematuria: Principal | ICD-10-CM

## 2023-12-19 DIAGNOSIS — E039 Hypothyroidism, unspecified: Secondary | ICD-10-CM | POA: Diagnosis not present

## 2023-12-19 DIAGNOSIS — I252 Old myocardial infarction: Secondary | ICD-10-CM | POA: Diagnosis not present

## 2023-12-19 DIAGNOSIS — F05 Delirium due to known physiological condition: Secondary | ICD-10-CM | POA: Diagnosis present

## 2023-12-19 DIAGNOSIS — I6782 Cerebral ischemia: Secondary | ICD-10-CM | POA: Diagnosis not present

## 2023-12-19 DIAGNOSIS — Z7982 Long term (current) use of aspirin: Secondary | ICD-10-CM | POA: Diagnosis not present

## 2023-12-19 DIAGNOSIS — Z9183 Wandering in diseases classified elsewhere: Secondary | ICD-10-CM | POA: Diagnosis not present

## 2023-12-19 DIAGNOSIS — E785 Hyperlipidemia, unspecified: Secondary | ICD-10-CM | POA: Diagnosis not present

## 2023-12-19 DIAGNOSIS — Z602 Problems related to living alone: Secondary | ICD-10-CM | POA: Diagnosis present

## 2023-12-19 LAB — CBC WITH DIFFERENTIAL/PLATELET
Abs Immature Granulocytes: 0.01 K/uL (ref 0.00–0.07)
Basophils Absolute: 0 K/uL (ref 0.0–0.1)
Basophils Relative: 1 %
Eosinophils Absolute: 0 K/uL (ref 0.0–0.5)
Eosinophils Relative: 0 %
HCT: 43.4 % (ref 36.0–46.0)
Hemoglobin: 14.3 g/dL (ref 12.0–15.0)
Immature Granulocytes: 0 %
Lymphocytes Relative: 24 %
Lymphs Abs: 1.7 K/uL (ref 0.7–4.0)
MCH: 29.1 pg (ref 26.0–34.0)
MCHC: 32.9 g/dL (ref 30.0–36.0)
MCV: 88.4 fL (ref 80.0–100.0)
Monocytes Absolute: 0.6 K/uL (ref 0.1–1.0)
Monocytes Relative: 8 %
Neutro Abs: 4.8 K/uL (ref 1.7–7.7)
Neutrophils Relative %: 67 %
Platelets: 248 K/uL (ref 150–400)
RBC: 4.91 MIL/uL (ref 3.87–5.11)
RDW: 13.8 % (ref 11.5–15.5)
WBC: 7.2 K/uL (ref 4.0–10.5)
nRBC: 0 % (ref 0.0–0.2)

## 2023-12-19 LAB — COMPREHENSIVE METABOLIC PANEL WITH GFR
ALT: 11 U/L (ref 0–44)
AST: 20 U/L (ref 15–41)
Albumin: 4.3 g/dL (ref 3.5–5.0)
Alkaline Phosphatase: 80 U/L (ref 38–126)
Anion gap: 8 (ref 5–15)
BUN: 21 mg/dL (ref 8–23)
CO2: 27 mmol/L (ref 22–32)
Calcium: 10.1 mg/dL (ref 8.9–10.3)
Chloride: 105 mmol/L (ref 98–111)
Creatinine, Ser: 0.63 mg/dL (ref 0.44–1.00)
GFR, Estimated: >60 mL/min (ref >60)
Glucose, Bld: 124 mg/dL — ABNORMAL HIGH (ref 70–99)
Potassium: 3.6 mmol/L (ref 3.5–5.1)
Sodium: 140 mmol/L (ref 135–145)
Total Bilirubin: 0.7 mg/dL (ref 0.0–1.2)
Total Protein: 7.6 g/dL (ref 6.5–8.1)

## 2023-12-19 LAB — URINALYSIS, ROUTINE W REFLEX MICROSCOPIC
Bilirubin Urine: NEGATIVE
Glucose, UA: NEGATIVE mg/dL
Ketones, ur: NEGATIVE mg/dL
Nitrite: POSITIVE — AB
Protein, ur: NEGATIVE mg/dL
Specific Gravity, Urine: 1.02 (ref 1.005–1.030)
pH: 5.5 (ref 5.0–8.0)

## 2023-12-19 LAB — URINALYSIS, MICROSCOPIC (REFLEX)

## 2023-12-19 LAB — RAPID URINE DRUG SCREEN, HOSP PERFORMED
Amphetamines: NOT DETECTED
Barbiturates: NOT DETECTED
Benzodiazepines: NOT DETECTED
Cocaine: NOT DETECTED
Opiates: NOT DETECTED
Tetrahydrocannabinol: NOT DETECTED

## 2023-12-19 LAB — ETHANOL: Alcohol, Ethyl (B): <15 mg/dL (ref <15)

## 2023-12-19 MED ORDER — CEPHALEXIN 250 MG PO CAPS
250.0000 mg | ORAL_CAPSULE | Freq: Three times a day (TID) | ORAL | Status: AC
  Administered 2023-12-19 – 2023-12-24 (×24): 250 mg via ORAL
  Filled 2023-12-19 (×15): qty 1

## 2023-12-19 NOTE — ED Triage Notes (Signed)
 Pt bib EMS from home. Neighbors found pt at their doorstep undressed from the waist down asking them for panties. Neighbors called EMS and states pt seems to be altered mentally more then usual. Pt repeating questions and statements. Pt presents A&Ox2. BP 136/96 HR 110 99% [id number]

## 2023-12-19 NOTE — H&P (Signed)
 History and Physical  AUDRINA [name] FMW:[id number] DOB: 11-Sep-1945 DOA: 12/19/2023  PCP: [name] LABOR, MD   Chief Complaint: Altered mental status  HPI: Caitlyn [name] is a 78 y.o. female with medical history significant for Alzheimer's dementia, CAD/NSTEMI s/p PCI, hypothyroidism, HTN, HLD, GERD, IDA and arthritis who was brought in by EMS for evaluation of altered mental status. EMS was called by patient's neighbors after she was found at the adult step undressed from the waist down and sent for panties. Per neighbors, patient has been altered more than her usual. On evaluation, patient pleasantly confused. States she got up to dress this morning. She is aware of her neighbors but does not remember going to the house. She would like to leave to go back to her house and her dog, Caitlyn [name]. Patient told me of her dog's name but forgot she told me a few minutes into encounter.  She is able to tell me her full name, DOB, current location, current year but not the month. States she does not drive but able to take care of herself at home with help from her neighbors.  Continues to repeat that she plans to go back home tomorrow but can easily be redirected.  She reports a history of UTIs but denies any dysuria, fevers, chills, abdominal pain, nausea or vomiting.  Reports that she knew her mother had Alzheimer's but she refused to believe she has it. Tells me she has had about 5 heart attacks.  She does not take any medications and has not taken any meds for a long time.  ED Course: Initial vitals show temp 98.6, RR 18, HR 94, BP 150/72, SpO2 97% on room air. Initial labs significant for normal kidney function, normal CBC, normal ethanol levels, normal UDS, UA shows positive nitrite, moderate leuks, WBC 21-50 and many bacteria. CT head with no acute intracranial abnormalities.  Pt received Keflex  250 mg x 1. Patient did not want to stay in the ED however due to unsafe discharge, TOC and [facility] were  consulted.  Patient was IVC by psych, who recommended medical admission for further evaluation and management and discharge planning. TRH was consulted for admission.   Review of Systems: Please see HPI for pertinent positives and negatives. A complete 10 system review of systems are otherwise negative.  Past Medical History:  Diagnosis Date   Anemia    Arthritis    Bleeding diathesis (HCC) 02/22/2013   Closed right hip fracture (HCC) 05/28/2016   Coronary artery disease 06/07/2016   NSTEMI 05/2016 s/p 2.25 x 20 mm DES to mid LAD // NSTEMI 08/2017 s/p 2.5 x 8 mm DES to mid LAD, 3 x 12 mm DES to proximal LAD  LHC 11/29/2017: Prox and mid LAD stents patent, jailed D1 proximal 50, mid 60-80 (unchanged) // Nuc stress 12/2017: low risk   E coli enteritis    GERD (gastroesophageal reflux disease)    H/O hiatal hernia    Head injury    Hypertension    Iron  deficiency anemia, unspecified 02/22/2013   Mass of right kidney 08/22/2013   2.5 cm solid lesion seen on 5/14 CT   Myocardial infarction [facility])    Radicular pain of thoracic region 08/22/2013   Left T-8 started after pushing heavy chest 1/15; intermittent; no focal neuro deficit   Past Surgical History:  Procedure Laterality Date   ABDOMINAL HYSTERECTOMY     BREAST BIOPSY     BREAST LUMPECTOMY Left    BREAST REDUCTION  SURGERY  1981   BREAST SURGERY  1981   rt-nodes taken out-non cancer   BREAST SURGERY  1981   rt lumpectomy   CARDIAC CATHETERIZATION N/A 06/01/2016   Procedure: LEFT HEART CATH;  Surgeon: Dorn JINNY Lesches, MD;  [facility]: [facility] INVASIVE CV LAB;  Service: Cardiovascular;  Laterality: N/A;   CARDIAC CATHETERIZATION N/A 06/01/2016   Procedure: Coronary Stent Intervention;  Surgeon: Dorn JINNY Lesches, MD;  Location: [facility] INVASIVE CV LAB;  Service: Cardiovascular;  Laterality: N/A;   COLONOSCOPY     CORONARY STENT INTERVENTION N/A 08/14/2017   Procedure: CORONARY STENT INTERVENTION;  Surgeon: Verlin Lonni BIRCH, MD;  Location:  [facility] INVASIVE CV LAB;  Service: Cardiovascular;  Laterality: N/A;   FACIAL FRACTURE SURGERY  2000   FEMUR IM NAIL Right 06/03/2016   Procedure: INTRAMEDULLARY (IM) NAIL FEMORAL;  Surgeon: Redell Shoals, MD;  Location: [facility] OR;  Service: Orthopedics;  Laterality: Right;   LEFT HEART CATH AND CORONARY ANGIOGRAPHY N/A 08/14/2017   Procedure: LEFT HEART CATH AND CORONARY ANGIOGRAPHY;  Surgeon: Verlin Lonni BIRCH, MD;  Location: [facility] INVASIVE CV LAB;  Service: Cardiovascular;  Laterality: N/A;   LEFT HEART CATH AND CORONARY ANGIOGRAPHY N/A 11/29/2017   Procedure: LEFT HEART CATH AND CORONARY ANGIOGRAPHY;  Surgeon: Claudene Victory ORN, MD;  Location: [facility] INVASIVE CV LAB;  Service: Cardiovascular;  Laterality: N/A;   REDUCTION MAMMAPLASTY Bilateral    years ago   TONSILLECTOMY     TRIGGER FINGER RELEASE Right 11/28/2013   Procedure: RELEASE TRIGGER FINGER/A-1 PULLEY RIGHT LONG FINGER;  Surgeon: Lamar Leonor Mickey LULLA, MD;  Location: [facility];  Service: Orthopedics;  Laterality: Right;   URETHRA SURGERY     age 43   Social History:  reports that she has never smoked. She has never used smokeless tobacco. She reports current alcohol use. She reports that she does not use drugs.  Allergies  Allergen Reactions   Cortisone Anaphylaxis    Some forms of cortisone; patient cannot recall the reaction because it's been a long time since she's taken this medications   Crestor  [Rosuvastatin  Calcium ] Other (See Comments)    Memory loss for 3 months   Demerol [Meperidine] Anaphylaxis and Other (See Comments)    Extremely ill with N/V and resp.   Monosodium Glutamate Anaphylaxis   Morphine And Codeine Anaphylaxis, Nausea And Vomiting and Other (See Comments)   Prednisone Anaphylaxis    confusion   Shellfish Allergy Anaphylaxis   Sulfa Antibiotics Anaphylaxis   Buprenorphine Hcl Nausea And Vomiting and Hypertension    SHAKING   Hctz [Hydrochlorothiazide ] Other (See Comments)    Dizziness     Readi-Cat [Barium Sulfate] Diarrhea    Patient does not want to drink Readi-Cat because it gives her diarrhea   Xanax [Alprazolam] Other (See Comments)    makes me crazy   Rosuvastatin  Other (See Comments)    Immediate Memory Loss    Benzoic Acid Nausea Only    Family History  Problem Relation Age of Onset   Heart attack Father    Heart disease Mother    Alzheimer's disease Mother    Heart disease Brother    Colon cancer Maternal Grandfather    Stomach cancer Neg Hx    Rectal cancer Neg Hx    Esophageal cancer Neg Hx    Liver cancer Neg Hx      Prior to Admission medications   Medication Sig Start Date End Date Taking? Authorizing Provider  acetaminophen  (TYLENOL ) 500 MG tablet Take  500 mg by mouth every 6 (six) hours as needed for moderate pain (pain score 4-6).    [provider]  amLODipine  (NORVASC ) 5 MG tablet Take 1 tablet (5 mg total) by mouth daily. 06/23/23 06/22/24  Court Dorn PARAS, MD  aspirin  EC 81 MG tablet Take 1 tablet (81 mg total) by mouth daily. 06/23/23   Court Dorn PARAS, MD  carvedilol  (COREG ) 6.25 MG tablet Take 1 tablet (6.25 mg total) by mouth 2 (two) times daily with a meal. 06/23/23   Court Dorn PARAS, MD    Physical Exam: BP (!) 143/88 (BP Location: Left Arm)   Pulse 85   Temp 98.7 F (37.1 C)   Resp 18   Ht 5' 4 (1.626 m)   Wt 58.5 kg   SpO2 99%   BMI 22.14 kg/m  General: Well-appearing elderly woman laying in bed. No acute distress. HEENT: [place]/AT. Anicteric sclera CV: RRR. No murmurs, rubs, or gallops. No LE edema Pulmonary: Lungs CTAB. Normal effort. No wheezing or rales. Abdominal: Soft, nontender, nondistended. Normal bowel sounds. Extremities: Palpable radial and DP pulses. Normal ROM. Skin: Warm and dry. No obvious rash or lesions. Neuro: Alert and overall oriented, only disoriented to month. Moves all extremities. Normal sensation to light touch. No focal deficit. Psych: Normal mood and affect          Labs on  Admission:  Basic Metabolic Panel: Recent Labs  Lab 12/19/23 1828  NA 140  K 3.6  CL 105  CO2 27  GLUCOSE 124*  BUN 21  CREATININE 0.63  CALCIUM  10.1   Liver Function Tests: Recent Labs  Lab 12/19/23 1828  AST 20  ALT 11  ALKPHOS 80  BILITOT 0.7  PROT 7.6  ALBUMIN 4.3   No results for input(s): LIPASE, AMYLASE in the last 168 hours. No results for input(s): AMMONIA in the last 168 hours. CBC: Recent Labs  Lab 12/19/23 1828  WBC 7.2  NEUTROABS 4.8  HGB 14.3  HCT 43.4  MCV 88.4  PLT 248   Cardiac Enzymes: No results for input(s): CKTOTAL, CKMB, CKMBINDEX, TROPONINI in the last 168 hours. BNP (last 3 results) No results for input(s): BNP in the last 8760 hours.  ProBNP (last 3 results) No results for input(s): PROBNP in the last 8760 hours.  CBG: No results for input(s): GLUCAP in the last 168 hours.  Radiological Exams on Admission: CT Head Wo Contrast Result Date: 12/19/2023 CLINICAL DATA:  Mental status change EXAM: CT HEAD WITHOUT CONTRAST TECHNIQUE: Contiguous axial images were obtained from the base of the skull through the vertex without intravenous contrast. RADIATION DOSE REDUCTION: This exam was performed according to the departmental dose-optimization program which includes automated exposure control, adjustment of the mA and/or kV according to patient size and/or use of iterative reconstruction technique. COMPARISON:  MRI 11/23/2010, CT brain 11/23/2010 FINDINGS: Brain: No acute territorial infarction, hemorrhage or intracranial mass. Atrophy progression. Moderate chronic small vessel ischemic changes of the white matter. Prominent ventricles, slightly increased in likely due to atrophy progression. Interval chronic appearing small left ganglial capsular infarct. Vascular: No hyperdense vessels.  Carotid vascular calcification Skull: Normal. Negative for fracture or focal lesion. Sinuses/Orbits: No acute finding. Other: None IMPRESSION:  1. No CT evidence for acute intracranial abnormality. 2. Atrophy and chronic small vessel ischemic changes of the white matter. Interval chronic appearing small left gangliocapsular infarct. Electronically Signed   By: Luke Bun M.D.   On: 12/19/2023 18:00   Assessment/Plan Caitlyn [name]  is a 78 y.o. female with medical history significant for Alzheimer's dementia, CAD/NSTEMI s/p PCI, hypothyroidism, HTN, HLD, GERD, IDA and arthritis who was brought in by EMS for evaluation of altered mental status and admitted for acute metabolic encephalopathy in the setting of UTI.  # Acute metabolic encephalopathy # Delirium - Patient with Alzheimer's dementia presented with worsening confusion - This is likely in the setting of acute cystitis - Patient lives alone with her dog but asked to leave the hospital multiple times - Psych is following, patient IVC'ed to complete medical treatment and to determine safe disposition - Delirium precautions  # Acute cystitis - Pt presented with altered mental status - Urinalysis shows signs of infection - Continue Keflex  every 8 hours for 7 days - F/u urine culture - Trend CBC and fever curve  # Alzheimer's dementia - Patient with history of moderate dementia but has progressed recently - Sister is concerned about patient's ability to care for self due to recently increased confusion, misplacing belongings, neglecting finances - TOC consulted to assist with possible SNF placement - PT/OT eval and treat - Delirium precautions  # Hypertension - BP elevated with SBP in the 140s to 150s - Patient reports she has not taken any of her meds for many months - Resume Coreg  and amlodipine   # CAD/NSTEMI s/p PCI - Resume aspirin    DVT prophylaxis: Lovenox      Code Status: Full Code  Consults called: Psych  Family Communication: No family at bedside  Severity of Illness: The appropriate patient status for this patient is INPATIENT. Inpatient status is  judged to be reasonable and necessary in order to provide the required intensity of service to ensure the patient's safety. The patient's presenting symptoms, physical exam findings, and initial radiographic and laboratory data in the context of their chronic comorbidities is felt to place them at high risk for further clinical deterioration. Furthermore, it is not anticipated that the patient will be medically stable for discharge from the hospital within 2 midnights of admission.   * I certify that at the point of admission it is my clinical judgment that the patient will require inpatient hospital care spanning beyond 2 midnights from the point of admission due to high intensity of service, high risk for further deterioration and high frequency of surveillance required.*  Level of care: Med-Surg   This record has been created using Conservation officer, historic buildings. Errors have been sought and corrected, but may not always be located. Such creation errors do not reflect on the standard of care.   Lou Claretta HERO, MD 12/20/2023, 2:25 AM [facility] Pager: [phone] Isaiah 41:10   If 7PM-7AM, please contact night-coverage [email or web address] Password [email or web address]

## 2023-12-19 NOTE — ED Notes (Signed)
 Pt taken to bathroom and agreed to give urine sample. Upon return to check on pt she was screaming because she thought she was locked in bathroom and was then refusing to give any sample. Stating she is going home.

## 2023-12-19 NOTE — ED Notes (Signed)
 Pt intermittently becoming agitated and wanting to leave.  RN continues to reassure patient that tests are being run and needing to wait for the doctor.  Pt concerned about dog at home.  With pt's permission, Erminio the emergency contact and neighbor, was contacted to update that patient would be staying and to also reassure patient that her dog is being taken care of by Erminio and husband.

## 2023-12-19 NOTE — BH Assessment (Signed)
 This patient has been referred to [facility] telecare for her teleassessment.  Iris coordinator will contact us  regarding a time and provider to see patient.

## 2023-12-19 NOTE — ED Notes (Signed)
 Charge RN aware of IVC order and will contact to get sitter.

## 2023-12-19 NOTE — ED Provider Notes (Addendum)
 [facility] EMERGENCY DEPARTMENT AT [facility] Provider Note   CSN: [id number] Arrival date & time: 12/19/23  1637     Patient presents with: Altered Mental Status   Caitlyn [name] is a 78 y.o. female.    Altered Mental Status  Patient has a history of moderate dementia hypertension hyperlipidemia  Patient states she is not sure why she is here in the emergency room.  Patient states there was a disagreement because someone was trying to take her dog.  Patient cannot tell me who that specifically was.  Patient states she has a neighbor that felt she should come to the hospital to get checked out.  Patient states the police were not there.  She is not sure who called the ambulance to bring her here.  Patient states she does not want to stay here overnight.  She had not eaten much today and would like to go home and get something to eat  EMS notes indicate neighbors found the patient standing at their doorstep undressed from the waist down asking for underwear.  Neighbors called EMS to bring the patient to the ED for evaluation.  Prior to Admission medications   Medication Sig Start Date End Date Taking? Authorizing Provider  acetaminophen  (TYLENOL ) 500 MG tablet Take 500 mg by mouth every 6 (six) hours as needed for moderate pain (pain score 4-6).    [provider]  ALBUTEROL  SULFATE HFA IN Inhale 2 puffs into the lungs every 6 (six) hours as needed (shortness of breath/wheezing). Patient not taking: Reported on 05/22/2023    [provider]  amLODipine  (NORVASC ) 5 MG tablet Take 1 tablet (5 mg total) by mouth daily. 06/23/23 06/22/24  Court Dorn PARAS, MD  Ascorbic Acid (VITAMIN C PO) Take 1 tablet by mouth daily. Patient not taking: Reported on 06/23/2023    [provider]  aspirin  EC 81 MG tablet Take 1 tablet (81 mg total) by mouth daily. 06/23/23   Court Dorn PARAS, MD  carvedilol  (COREG ) 6.25 MG tablet Take 1 tablet (6.25 mg total) by mouth 2  (two) times daily with a meal. 06/23/23   Court Dorn PARAS, MD  Cholecalciferol (VITAMIN D PO) Take 3,000 Units by mouth daily.  Patient not taking: Reported on 06/23/2023 07/17/19   [provider]  ferrous sulfate  325 (65 FE) MG EC tablet Take 1 tablet (325 mg total) by mouth every other day. Patient not taking: Reported on 06/23/2023 05/22/23 05/21/24  Christia Budds, MD  pantoprazole  (PROTONIX ) 40 MG tablet Take 1 tablet (40 mg total) by mouth daily. Patient not taking: Reported on 06/23/2023 05/23/23   Christia Budds, MD    Allergies: Cortisone, Crestor  [rosuvastatin  calcium ], Demerol [meperidine], Monosodium glutamate, Morphine and codeine, Prednisone, Shellfish allergy, Sulfa antibiotics, Buprenorphine hcl, Hctz [hydrochlorothiazide ], Readi-cat [barium sulfate], Xanax [alprazolam], Rosuvastatin , and Benzoic acid    Review of Systems  Updated Vital Signs BP (!) 150/72 (BP Location: Right Arm)   Pulse 94   Temp 98.6 F (37 C) (Oral)   Resp 18   Ht 1.626 m (5' 4)   Wt 58.5 kg   SpO2 97%   BMI 22.14 kg/m   Physical Exam Vitals and nursing note reviewed.  Constitutional:      Appearance: She is well-developed. She is not diaphoretic.  HENT:     Head: Normocephalic and atraumatic.     Right Ear: External ear normal.     Left Ear: External ear normal.  Eyes:  General: No scleral icterus.       Right eye: No discharge.        Left eye: No discharge.     Conjunctiva/sclera: Conjunctivae normal.  Neck:     Trachea: No tracheal deviation.  Cardiovascular:     Rate and Rhythm: Normal rate and regular rhythm.  Pulmonary:     Effort: Pulmonary effort is normal. No respiratory distress.     Breath sounds: Normal breath sounds. No stridor. No wheezing or rales.  Abdominal:     General: Bowel sounds are normal. There is no distension.     Palpations: Abdomen is soft.     Tenderness: There is no abdominal tenderness. There is no guarding or rebound.  Musculoskeletal:         General: No tenderness or deformity.     Cervical back: Neck supple.  Skin:    General: Skin is warm and dry.     Findings: No rash.  Neurological:     General: No focal deficit present.     Mental Status: She is alert.     GCS: GCS eye subscore is 4. GCS verbal subscore is 4. GCS motor subscore is 6.     Cranial Nerves: No cranial nerve deficit, dysarthria or facial asymmetry.     Sensory: No sensory deficit.     Motor: No abnormal muscle tone or seizure activity.     Coordination: Coordination normal.     Comments: Patient cannot tell me the month of the year, patient states she does not usually keep track about, she is able to tell me her name and where she is located  Psychiatric:        Mood and Affect: Mood normal.     (all labs ordered are listed, but only abnormal results are displayed) Labs Reviewed  COMPREHENSIVE METABOLIC PANEL WITH GFR - Abnormal; Notable for the following components:      Result Value   Glucose, Bld 124 (*)    All other components within normal limits  ETHANOL  CBC WITH DIFFERENTIAL/PLATELET  RAPID URINE DRUG SCREEN, HOSP PERFORMED  URINALYSIS, ROUTINE W REFLEX MICROSCOPIC    EKG: None  Radiology: CT Head Wo Contrast Result Date: 12/19/2023 CLINICAL DATA:  Mental status change EXAM: CT HEAD WITHOUT CONTRAST TECHNIQUE: Contiguous axial images were obtained from the base of the skull through the vertex without intravenous contrast. RADIATION DOSE REDUCTION: This exam was performed according to the departmental dose-optimization program which includes automated exposure control, adjustment of the mA and/or kV according to patient size and/or use of iterative reconstruction technique. COMPARISON:  MRI 11/23/2010, CT brain 11/23/2010 FINDINGS: Brain: No acute territorial infarction, hemorrhage or intracranial mass. Atrophy progression. Moderate chronic small vessel ischemic changes of the white matter. Prominent ventricles, slightly increased in  likely due to atrophy progression. Interval chronic appearing small left ganglial capsular infarct. Vascular: No hyperdense vessels.  Carotid vascular calcification Skull: Normal. Negative for fracture or focal lesion. Sinuses/Orbits: No acute finding. Other: None IMPRESSION: 1. No CT evidence for acute intracranial abnormality. 2. Atrophy and chronic small vessel ischemic changes of the white matter. Interval chronic appearing small left gangliocapsular infarct. Electronically Signed   By: Luke Bun M.D.   On: 12/19/2023 18:00     Procedures   Medications Ordered in the ED - No data to display  Clinical Course as of 12/19/23 2247  Sun Dec 19, 2023  1731 Discussed with pt's sister.  She believes she is moving to [place]  but  that is not occurring.  They do not have a very close relationship with patient.  Pt has a daughter but has not spoken to her for years. [JK]  1912 CBC with Diff nl [JK]  1926 Head CT does not show any acute abnormality.  CBC and metabolic panel normal. [JK]  2246 Urinalysis, Routine w reflex microscopic -Urine, Clean Catch(!) Urinalysis does show positive nitrate moderate leukocyte esterase.  Patient does have 21-50 white blood cells many bacteria however there is squamous cell contamination [JK]    Clinical Course User Index [JK] Randol Simmonds, MD                                 Medical Decision Making Problems Addressed: Dementia with behavioral disturbance [facility]): acute illness or injury that poses a threat to life or bodily functions  Amount and/or Complexity of Data Reviewed Labs: ordered. Decision-making details documented in ED Course. Radiology: ordered.  Risk Prescription drug management.   Patient presented to the ED for worsening confusion.  I have spoken with the patient's sister as well as her neighbor.  Patient was found initially trying to find her dog because she thought her dog was lost.  Patient tells me that someone was trying to take her dog  however the neighbor states police were called apparently the dog was locked in a room in her home but the patient had been wandering outside trying to find the dog.  Patient also has been packing up her house saying she is moving to [place]  however there have been no arrangements made for her to move to [place] .  Neighbors have noticed that patient is getting more and more confused.  Patient's laboratory tests are unremarkable.  Head CT does not show any acute findings.  I suspect her symptoms are related to her dementia.  Patient does not want to stay in the ED however I do not feel that it is safe for her to go home by herself right now.  She does not have any family members to look after her.  Placed under IVC  Will ask for [facility] and psychiatry evaluation.   UA does show UTI.  Psych recommendation is for medical admission with psych, toc eval.  Will consult medical service.  Final diagnoses:  Dementia with behavioral disturbance [facility])    ED Discharge Orders     None          Randol Simmonds, MD 12/19/23 NICHOLE    Randol Simmonds, MD 12/19/23 [phone]

## 2023-12-19 NOTE — ED Notes (Signed)
 Pt moved to room 7 for TTS.

## 2023-12-19 NOTE — ED Notes (Signed)
 Pt given graham crackers, peanut butter, and cranberry juice, and warm blanket.

## 2023-12-19 NOTE — Consult Note (Signed)
 [facility] Consult Note  Patient Name: Caitlyn [name] MRN: [id number] DOB: 06-18-1945 DATE OF Consult: 12/19/2023  PRIMARY PSYCHIATRIC DIAGNOSES   Major Neurocognitive Disorder, probable Alzheimer's type (rule out)  Urinary Tract Infection  RECOMMENDATIONS  Assessment: 78 year old woman with altered mental status, currently being treated for a UTI, with behavior and memory changes suggestive of delirium superimposed on a possible underlying Major Neurocognitive Disorder (likely Alzheimer's type). Despite being pleasant and cooperative, she demonstrates poor insight, impaired memory, and significant judgment deficits that compromise her safety in the community. There is no evidence of active psychosis, suicidal/homicidal ideation, or other psychiatric emergencies warranting inpatient psychiatric hospitalization.  Recommendations: Does NOT meet criteria for inpatient psychiatric admission. No SI/HI, no active psychosis, and no acute psychiatric instability. Continue medical treatment for UTI -- likely contributing to delirium. Hospitalist consult (if not already involved) for further evaluation and management of medical contributors and discharge planning. Neurology consult for assessment of suspected neurocognitive disorder; consider imaging (MRI) and outpatient neuropsych testing.  Case Management / Social Work: Initiate SNF placement due to unsafe independent living. Discuss formal capacity assessment and explore legal guardianship if needed. Coordinate with patient's sister (collateral source, but unable to assume caregiving role).  Medication recommendations:  Hold off on initiating new psychiatric medications until UTI is treated and patient's cognitive baseline is reassessed. Her current symptoms (disorientation, memory issues) may be due to delirium and not an underlying mood or psychotic disorder. Avoid benzodiazepines and anticholinergics, which can worsen confusion and  increase fall risk in elderly patients.  If agitation, paranoia, or behavioral disturbances emerge during hospitalization: Consider low-dose quetiapine  (e.g., 12.5-25 mg PO at bedtime) on an as-needed basis. Rationale: Quetiapine  is relatively safer for elderly patients when antipsychotics are needed temporarily for behavioral symptoms in the context of delirium or dementia. Caution: Use lowest effective dose for shortest duration; monitor for sedation, orthostasis.  If insomnia becomes problematic, consider: Melatonin 3-5 mg PO at bedtime as first-line option  No antidepressants or mood stabilizers indicated at this time -- patient denies depressive symptoms or mood instability during evaluation. Future medication needs may be revisited after full medical and neurocognitive workup.  Disposition: Patient does not require inpatient psychiatric admission at this time. Recommend medical admission for treatment of UTI and comprehensive cognitive workup. Psychiatric follow-up as outpatient once medically stabilized and appropriately placed.   Communication: Treatment team members (and family members if applicable) who were involved in treatment/care discussions and planning, and with whom we spoke or engaged with via secure text/chat, include the following: patient's treatment team.  Thank you for involving us  in the care of this patient. If you have any additional questions or concerns, please call [phone] and ask for me or the provider on-call.  TELEPSYCHIATRY ATTESTATION & CONSENT  As the provider for this telehealth consult, I attest that I verified the patient's identity using two separate identifiers, introduced myself to the patient, provided my credentials, disclosed my location, and performed this encounter via a HIPAA-compliant, real-time, face-to-face, two-way, interactive audio and video platform and with the full consent and agreement of the patient (or guardian as applicable.)   Patient physical location: [facility]. Telehealth provider physical location: home office in state of [place].  Video start time: 2054 [facility] Time) Video end time: 2115 (Central Time)  IDENTIFYING DATA  Caitlyn [name] is a 78 y.o. year-old female for whom a psychiatric consultation has been ordered by the primary provider. The patient was identified using two separate identifiers.  CHIEF  COMPLAINT/REASON FOR CONSULT  - I don't know, to tell you the truth. I'm wondering myself. I just saw a doctor, and he suggested that I do this. And I said, do what? He says, go back in the hospital. I don't know. I don't think I need to be here, but I think maybe it might have something to do with I probably should not go home too early or too I don't know what I'm trying to say. - Pt. was brought to the hospital after neighbors found her undressed on their doorstep; she is unsure of the reason for being in the hospital and does not believe hospitalization is necessary.  HISTORY OF PRESENT ILLNESS (HPI)  An 78 year old woman was evaluated in the emergency department following a concerning episode in which she was reportedly found on a neighbor's doorstep without clothing from the waist up. She reports no recollection of the event's cause and denies intentional exposure. She stated she had hurriedly dressed and was searching for her underwear but was unsure how or why she ended up outside in that state. She appeared uncertain about why she was brought to the hospital and denied believing hospitalization was necessary.  She reported living alone with her dog, who was recently taken and later found locked in a room inside her own house by police. She described strong emotional attachment to her dog and expressed skepticism about the police's handling of the situation. She stated, "The police is not the smartest, you know," though she denied paranoia or suspicion beyond this comment.  The patient denied  suicidal ideation, depressive symptoms, or past psychiatric diagnoses. She described her mood as "generally happy," and cited her daughter and her dog as strong protective factors. She reported adequate sleep and appetite and denied hallucinations, paranoia, or delusional thinking. However, she did mention having reached out to the President for help with her dog, which may reflect confabulatory or mildly grandiose thought content, though not frankly psychotic.  The patient was pleasant during the interview, cooperative, and oriented to location and year, but was unable to identify the exact date or month. She attributed this to the hospital environment being repetitive. She had some difficulty understanding questions, possibly due to hearing loss or mild cognitive processing issues.  Collateral Information: Spoke with patient's sister, Caitlyn [name], who expressed serious concerns about the patient's cognitive and functional status: Reports increasing confusion, misplacing belongings, neglecting finances, leading to utilities being turned off. Sister has assumed responsibility for managing patient's finances. Describes mood lability, with episodes of warmth followed by volatility. Reports neighbors calling her about the patient packing her belongings and claiming she was moving to [place]  (which is not true). Sister reports that patient has no contact with her daughter. Believes the patient has Alzheimer's disease, citing a family history (mother with Alzheimer's). Believes the patient is not safe to live alone and is in need of skilled nursing facility (SNF) care. Plans to visit from [place]  but states she cannot assume caregiving responsibilities due to her own age (78 years old).  PAST PSYCHIATRIC HISTORY  - No past psychiatric diagnoses or history of suicidal ideation or suicide attempts reported. - No prior psychiatric treatments or admissions reported. - No previously tried psychiatric medications  reported. Otherwise as per HPI above.  PAST MEDICAL HISTORY  Past Medical History:  Diagnosis Date   Anemia    Arthritis    Bleeding diathesis (HCC) 02/22/2013   Closed right hip fracture (HCC) 05/28/2016   Coronary artery disease 06/07/2016  NSTEMI 05/2016 s/p 2.25 x 20 mm DES to mid LAD // NSTEMI 08/2017 s/p 2.5 x 8 mm DES to mid LAD, 3 x 12 mm DES to proximal LAD  LHC 11/29/2017: Prox and mid LAD stents patent, jailed D1 proximal 50, mid 60-80 (unchanged) // Nuc stress 12/2017: low risk   E coli enteritis    GERD (gastroesophageal reflux disease)    H/O hiatal hernia    Head injury    Hypertension    Iron  deficiency anemia, unspecified 02/22/2013   Mass of right kidney 08/22/2013   2.5 cm solid lesion seen on 5/14 CT   Myocardial infarction [facility])    Radicular pain of thoracic region 08/22/2013   Left T-8 started after pushing heavy chest 1/15; intermittent; no focal neuro deficit     HOME MEDICATIONS  PTA Medications  Medication Sig   acetaminophen  (TYLENOL ) 500 MG tablet Take 500 mg by mouth every 6 (six) hours as needed for moderate pain (pain score 4-6).   amLODipine  (NORVASC ) 5 MG tablet Take 1 tablet (5 mg total) by mouth daily.   aspirin  EC 81 MG tablet Take 1 tablet (81 mg total) by mouth daily.   carvedilol  (COREG ) 6.25 MG tablet Take 1 tablet (6.25 mg total) by mouth 2 (two) times daily with a meal.     ALLERGIES  Allergies  Allergen Reactions   Cortisone Anaphylaxis    Some forms of cortisone; patient cannot recall the reaction because it's been a long time since she's taken this medications   Crestor  [Rosuvastatin  Calcium ] Other (See Comments)    Memory loss for 3 months   Demerol [Meperidine] Anaphylaxis and Other (See Comments)    Extremely ill with N/V and resp.   Monosodium Glutamate Anaphylaxis   Morphine And Codeine Anaphylaxis, Nausea And Vomiting and Other (See Comments)   Prednisone Anaphylaxis    confusion   Shellfish Allergy Anaphylaxis   Sulfa  Antibiotics Anaphylaxis   Buprenorphine Hcl Nausea And Vomiting and Hypertension    SHAKING   Hctz [Hydrochlorothiazide ] Other (See Comments)    Dizziness    Readi-Cat [Barium Sulfate] Diarrhea    Patient does not want to drink Readi-Cat because it gives her diarrhea   Xanax [Alprazolam] Other (See Comments)    makes me crazy   Rosuvastatin  Other (See Comments)    Immediate Memory Loss    Benzoic Acid Nausea Only    SOCIAL & SUBSTANCE USE HISTORY  Social History   Socioeconomic History   Marital status: Widowed    Spouse name: Not on file   Number of children: 1   Years of education: Not on file   Highest education level: Not on file  Occupational History   Occupation: retired  Tobacco Use   Smoking status: Never   Smokeless tobacco: Never  Vaping Use   Vaping status: Never Used  Substance and Sexual Activity   Alcohol use: Yes    Alcohol/week: 0.0 standard drinks of alcohol    Comment: occasional    Drug use: No   Sexual activity: Not on file  Other Topics Concern   Not on file  Social History Narrative   ** Merged History Encounter **       Social Drivers of Corporate investment banker Strain: Not on file  Food Insecurity: Not on file  Transportation Needs: Not on file  Physical Activity: Not on file  Stress: Not on file  Social Connections: Not on file   Social History   Tobacco  Use  Smoking Status Never  Smokeless Tobacco Never   Social History   Substance and Sexual Activity  Alcohol Use Yes   Alcohol/week: 0.0 standard drinks of alcohol   Comment: occasional    Social History   Substance and Sexual Activity  Drug Use No    - Tobacco: Denies - Alcohol: Occasional use, described as a little bit, just being a toddy, not daily - Other Drugs: Denies  FAMILY HISTORY  Family History  Problem Relation Age of Onset   Heart attack Father    Heart disease Mother    Alzheimer's disease Mother    Heart disease Brother    Colon cancer  Maternal Grandfather    Stomach cancer Neg Hx    Rectal cancer Neg Hx    Esophageal cancer Neg Hx    Liver cancer Neg Hx    Family Psychiatric History (if known):  Patient's daughter has bipolar disorder. Mother had Alzheimer's  MENTAL STATUS EXAM (MSE)  Appearance: Alert, wearing hospital attire, appropriate hygiene.  Behavior: Cooperative, appropriate, mildly tangential at times.  Speech: Normal rate and tone, somewhat tangential in content.  Mood: "I'm generally happy."  Affect: Congruent with stated mood; broad range.  Thought Process: Linear overall, but with occasional tangents and signs of circumstantiality.  Thought Content: No hallucinations or delusions elicited; questionable grandiosity/confabulation (e.g., calling the President for help).  Orientation: Oriented to place and year; unable to state current day or month.  Insight/Judgment: Impaired -- limited understanding of hospitalization necessity; denies functional deficits despite clear evidence of recent disorientation and neglect.  Cognition: Concern for cognitive impairment -- memory gaps, limited awareness, poor recall of recent events (e.g., partial undressing incident).  Risk Assessment: Denies suicidal or homicidal ideation; currently low risk for self-harm but may be at risk due to impaired judgment and self-neglect.   VITALS  Blood pressure (!) 150/72, pulse 94, temperature 98.6 F (37 C), temperature source Oral, resp. rate 18, height 5' 4 (1.626 m), weight 58.5 kg, SpO2 97%.  LABS  Admission on 12/19/2023  Component Date Value Ref Range Status   Sodium 12/19/2023 140  135 - 145 mmol/L Final   Potassium 12/19/2023 3.6  3.5 - 5.1 mmol/L Final   Chloride 12/19/2023 105  98 - 111 mmol/L Final   CO2 12/19/2023 27  22 - 32 mmol/L Final   Glucose, Bld 12/19/2023 124 (H)  70 - 99 mg/dL Final   Glucose reference range applies only to samples taken after fasting for at least 8 hours.   BUN 12/19/2023 21  8 -  23 mg/dL Final   Creatinine, Ser 12/19/2023 0.63  0.44 - 1.00 mg/dL Final   Calcium  12/19/2023 10.1  8.9 - 10.3 mg/dL Final   Total Protein 91/89/7974 7.6  6.5 - 8.1 g/dL Final   Albumin 91/89/7974 4.3  3.5 - 5.0 g/dL Final   AST 91/89/7974 20  15 - 41 U/L Final   ALT 12/19/2023 11  0 - 44 U/L Final   Alkaline Phosphatase 12/19/2023 80  38 - 126 U/L Final   Total Bilirubin 12/19/2023 0.7  0.0 - 1.2 mg/dL Final   GFR, Estimated 12/19/2023 >60  >60 mL/min Final   Comment: (NOTE) Calculated using the CKD-EPI Creatinine Equation (2021)    Anion gap 12/19/2023 8  5 - 15 Final   Performed at [facility], [address] [place], [place] [address]   Alcohol, Ethyl (B) 12/19/2023 <15  <15 mg/dL Final   Comment: (NOTE) For  medical purposes only. Performed at [facility], [address] [place], [place] [address]    Opiates 12/19/2023 NONE DETECTED  NONE DETECTED Final   Cocaine 12/19/2023 NONE DETECTED  NONE DETECTED Final   Benzodiazepines 12/19/2023 NONE DETECTED  NONE DETECTED Final   Amphetamines 12/19/2023 NONE DETECTED  NONE DETECTED Final   Tetrahydrocannabinol 12/19/2023 NONE DETECTED  NONE DETECTED Final   Barbiturates 12/19/2023 NONE DETECTED  NONE DETECTED Final   Comment: (NOTE) DRUG SCREEN FOR MEDICAL PURPOSES ONLY.  IF CONFIRMATION IS NEEDED FOR ANY PURPOSE, NOTIFY LAB WITHIN 5 DAYS.  LOWEST DETECTABLE LIMITS FOR URINE DRUG SCREEN Drug Class                     Cutoff (ng/mL) Amphetamine and metabolites    1000 Barbiturate and metabolites    200 Benzodiazepine                 200 Opiates and metabolites        300 Cocaine and metabolites        300 THC                            50 Performed at [facility], [address], [place], [place] [address]    WBC 12/19/2023 7.2  4.0 - 10.5 K/uL Final   RBC 12/19/2023 4.91  3.87 - 5.11 MIL/uL Final   Hemoglobin 12/19/2023 14.3  12.0 - 15.0 g/dL Final   HCT  91/89/7974 43.4  36.0 - 46.0 % Final   MCV 12/19/2023 88.4  80.0 - 100.0 fL Final   MCH 12/19/2023 29.1  26.0 - 34.0 pg Final   MCHC 12/19/2023 32.9  30.0 - 36.0 g/dL Final   RDW 91/89/7974 13.8  11.5 - 15.5 % Final   Platelets 12/19/2023 248  150 - 400 K/uL Final   nRBC 12/19/2023 0.0  0.0 - 0.2 % Final   Neutrophils Relative % 12/19/2023 67  % Final   Neutro Abs 12/19/2023 4.8  1.7 - 7.7 K/uL Final   Lymphocytes Relative 12/19/2023 24  % Final   Lymphs Abs 12/19/2023 1.7  0.7 - 4.0 K/uL Final   Monocytes Relative 12/19/2023 8  % Final   Monocytes Absolute 12/19/2023 0.6  0.1 - 1.0 K/uL Final   Eosinophils Relative 12/19/2023 0  % Final   Eosinophils Absolute 12/19/2023 0.0  0.0 - 0.5 K/uL Final   Basophils Relative 12/19/2023 1  % Final   Basophils Absolute 12/19/2023 0.0  0.0 - 0.1 K/uL Final   Immature Granulocytes 12/19/2023 0  % Final   Abs Immature Granulocytes 12/19/2023 0.01  0.00 - 0.07 K/uL Final   Performed at [facility], [address] [place] [address] [place], [place] [address]    PSYCHIATRIC REVIEW OF SYSTEMS (ROS)  ROS: Notable for the following relevant positive findings: ROS  Additional findings:      Musculoskeletal: No abnormal movements observed      Gait & Station: Laying/Sitting      Pain Screening: Denies      Nutrition & Dental Concerns: none reported  RISK FORMULATION/ASSESSMENT  Is the patient experiencing any suicidal or homicidal ideations: No       Explain if yes:  Protective factors considered for safety management: supportive sister and neighbors, access to appropriate clinical services  Risk factors/concerns considered for safety management:  include advanced age, living alone, and confusion leading to an incident  where she was found undressed outside her home.   Is there a safety management plan with the patient and treatment team to minimize risk factors and promote protective factors: Yes           Explain: case management, hospitalist  consult, Neurology consult Is crisis care placement or psychiatric hospitalization recommended: No     Based on my current evaluation and risk assessment, patient is determined at this time to be at:  Moderate Risk  *RISK ASSESSMENT Risk assessment is a dynamic process; it is possible that this patient's condition, and risk level, may change. This should be re-evaluated and managed over time as appropriate. Please re-consult psychiatric consult services if additional assistance is needed in terms of risk assessment and management. If your team decides to discharge this patient, please advise the patient how to best access emergency psychiatric services, or to call 911, if their condition worsens or they feel unsafe in any way.   Ines Hock, NP Telepsychiatry Consult Services

## 2023-12-19 NOTE — ED Notes (Signed)
 Pt moved back to [place]

## 2023-12-20 ENCOUNTER — Other Ambulatory Visit: Payer: Self-pay

## 2023-12-20 DIAGNOSIS — N3000 Acute cystitis without hematuria: Secondary | ICD-10-CM

## 2023-12-20 DIAGNOSIS — G9341 Metabolic encephalopathy: Secondary | ICD-10-CM | POA: Diagnosis not present

## 2023-12-20 DIAGNOSIS — F03918 Unspecified dementia, unspecified severity, with other behavioral disturbance: Principal | ICD-10-CM | POA: Diagnosis present

## 2023-12-20 DIAGNOSIS — R410 Disorientation, unspecified: Secondary | ICD-10-CM

## 2023-12-20 LAB — BASIC METABOLIC PANEL WITH GFR
Anion gap: 8 (ref 5–15)
BUN: 20 mg/dL (ref 8–23)
CO2: 24 mmol/L (ref 22–32)
Calcium: 9 mg/dL (ref 8.9–10.3)
Chloride: 105 mmol/L (ref 98–111)
Creatinine, Ser: 0.61 mg/dL (ref 0.44–1.00)
GFR, Estimated: >60 mL/min (ref >60)
Glucose, Bld: 106 mg/dL — ABNORMAL HIGH (ref 70–99)
Potassium: 3.4 mmol/L — ABNORMAL LOW (ref 3.5–5.1)
Sodium: 137 mmol/L (ref 135–145)

## 2023-12-20 LAB — CBC
HCT: 39 % (ref 36.0–46.0)
Hemoglobin: 12.7 g/dL (ref 12.0–15.0)
MCH: 28.8 pg (ref 26.0–34.0)
MCHC: 32.6 g/dL (ref 30.0–36.0)
MCV: 88.4 fL (ref 80.0–100.0)
Platelets: 210 K/uL (ref 150–400)
RBC: 4.41 MIL/uL (ref 3.87–5.11)
RDW: 13.8 % (ref 11.5–15.5)
WBC: 5.9 K/uL (ref 4.0–10.5)
nRBC: 0 % (ref 0.0–0.2)

## 2023-12-20 MED ORDER — ACETAMINOPHEN 325 MG PO TABS
650.0000 mg | ORAL_TABLET | Freq: Four times a day (QID) | ORAL | Status: DC | PRN
Start: 2023-12-20 — End: 2024-01-03
  Administered 2023-12-20 – 2023-12-31 (×3): 650 mg via ORAL
  Filled 2023-12-20 (×5): qty 2

## 2023-12-20 MED ORDER — MUSCLE RUB 10-15 % EX CREA
1.0000 | TOPICAL_CREAM | CUTANEOUS | Status: DC | PRN
  Administered 2023-12-31: 1 via TOPICAL
  Filled 2023-12-20: qty 85

## 2023-12-20 MED ORDER — ASPIRIN 81 MG PO TBEC
81.0000 mg | DELAYED_RELEASE_TABLET | Freq: Every day | ORAL | Status: DC
  Administered 2023-12-20 – 2024-01-03 (×18): 81 mg via ORAL
  Filled 2023-12-20 (×15): qty 1

## 2023-12-20 MED ORDER — ONDANSETRON HCL 4 MG PO TABS: 4.0000 mg | ORAL_TABLET | Freq: Four times a day (QID) | ORAL | Status: DC | PRN

## 2023-12-20 MED ORDER — BISACODYL 5 MG PO TBEC: 5.0000 mg | DELAYED_RELEASE_TABLET | Freq: Every day | ORAL | Status: DC | PRN

## 2023-12-20 MED ORDER — HALOPERIDOL LACTATE 5 MG/ML IJ SOLN: 2.5000 mg | Freq: Four times a day (QID) | INTRAMUSCULAR | Status: DC | PRN

## 2023-12-20 MED ORDER — ENOXAPARIN SODIUM 40 MG/0.4ML IJ SOSY
40.0000 mg | PREFILLED_SYRINGE | INTRAMUSCULAR | Status: DC
  Administered 2023-12-20 – 2023-12-26 (×10): 40 mg via SUBCUTANEOUS
  Filled 2023-12-20 (×7): qty 0.4

## 2023-12-20 MED ORDER — POTASSIUM CHLORIDE CRYS ER 20 MEQ PO TBCR
40.0000 meq | EXTENDED_RELEASE_TABLET | Freq: Once | ORAL | Status: AC
  Administered 2023-12-20 (×2): 40 meq via ORAL
  Filled 2023-12-20: qty 2

## 2023-12-20 MED ORDER — QUETIAPINE FUMARATE 25 MG PO TABS
12.5000 mg | ORAL_TABLET | Freq: Every day | ORAL | Status: DC
  Administered 2023-12-20 – 2024-01-02 (×17): 12.5 mg via ORAL
  Filled 2023-12-20 (×14): qty 1

## 2023-12-20 MED ORDER — AMLODIPINE BESYLATE 5 MG PO TABS
5.0000 mg | ORAL_TABLET | Freq: Every day | ORAL | Status: DC
  Administered 2023-12-20 – 2024-01-03 (×18): 5 mg via ORAL
  Filled 2023-12-20 (×15): qty 1

## 2023-12-20 MED ORDER — ONDANSETRON HCL 4 MG/2ML IJ SOLN: 4.0000 mg | Freq: Four times a day (QID) | INTRAMUSCULAR | Status: DC | PRN

## 2023-12-20 MED ORDER — SENNOSIDES-DOCUSATE SODIUM 8.6-50 MG PO TABS: 1.0000 | ORAL_TABLET | Freq: Every evening | ORAL | Status: DC | PRN

## 2023-12-20 MED ORDER — CARVEDILOL 6.25 MG PO TABS
6.2500 mg | ORAL_TABLET | Freq: Two times a day (BID) | ORAL | Status: DC
  Administered 2023-12-20 – 2024-01-03 (×35): 6.25 mg via ORAL
  Filled 2023-12-20 (×29): qty 1

## 2023-12-20 MED ORDER — ORAL CARE MOUTH RINSE
15.0000 mL | OROMUCOSAL | Status: DC | PRN
Start: 2023-12-20 — End: 2024-01-03

## 2023-12-20 MED ORDER — ACETAMINOPHEN 650 MG RE SUPP: 650.0000 mg | Freq: Four times a day (QID) | RECTAL | Status: DC | PRN

## 2023-12-20 NOTE — Evaluation (Signed)
 Occupational Therapy Evaluation Patient Details Name: Caitlyn [name] MRN: [id number] DOB: 01-01-1946 Today's Date: 12/20/2023   History of Present Illness   Pt is a 78 yo female presenting to [facility] on 12/19/23 after EMS was called by patient's neighbor after she was found standing, undressed from waist down asking for underwear.  Pt admitted for AMS and attempted to leave the ED, so psychiatry was consulted and pt ibnvoluntarily commited to hospital.  Pt with past hospitalization this year to [facility] on 05/21/23 for epigastric pain. PMH of MI, HTN, HLD, GERD, IDA, Alzheimer's dementia.     Clinical Impressions Patient is currently requiring as high as contact guard assistance with basic ADLs, as well as  supervision with bed mobility and up to CGA assist with ambulation in hallway without AD.  Pt with some mild staggering when tasked with looking at her phone during ambulation.    Current level of function is below patient's typical baseline.    During this evaluation, patient was limited by severely impaired cognition, labile mood and tearful at times but very pleasant and generalized weakness all of which has the potential to impact patient's safety and independence during functional mobility, as well as performance for ADLs, particularly IADLs due to pt's poor memory at this time.    Patient lives alone with neighbors who provide PRN supervision and assistance and a sister who is in [place] .  Patient demonstrates fair to good rehab potential, and should benefit from continued skilled occupational therapy services while in acute care to maximize safety, independence and quality of life at home.   Discharge plan pending as pt's sister asked to speak with SW.  Currently, due to cognitive deficits, pt would need 24/78 close supervision for her safety at home.    ?      If plan is discharge home, recommend the following:   A little help with walking and/or transfers;A little help with  bathing/dressing/bathroom;Supervision due to cognitive status     Functional Status Assessment   Patient has had a recent decline in their functional status and demonstrates the ability to make significant improvements in function in a reasonable and predictable amount of time.     Equipment Recommendations   None recommended by OT     Recommendations for Other Services   Speech consult (cognitive eval)     Precautions/Restrictions   Precautions Precautions: Fall Recall of Precautions/Restrictions: Impaired Precaution/Restrictions Comments: 1:1 Restrictions Weight Bearing Restrictions Per Provider Order: No     Mobility Bed Mobility Overal bed mobility: Needs Assistance Bed Mobility: Supine to Sit     Supine to sit: Supervision, HOB elevated          Transfers                          Balance Overall balance assessment: Mild deficits observed, not formally tested                                         ADL either performed or assessed with clinical judgement   ADL Overall ADL's : Needs assistance/impaired Eating/Feeding: Supervision/ safety;Set up   Grooming: Supervision/safety;Set up;Sitting   Upper Body Bathing: Supervision/ safety;Set up;Sitting;Cueing for sequencing   Lower Body Bathing: Contact guard assist;Sit to/from stand;Sitting/lateral leans;Cueing for sequencing   Upper Body Dressing : Set up;Supervision/safety;Cueing for sequencing;Sitting   Lower Body Dressing: Sitting/lateral  leans;Supervision/safety Lower Body Dressing Details (indicate cue type and reason): Pt able to reach and pull up socks. But not fully assessed. Toilet Transfer: Supervision/safety;Contact guard assist;Ambulation;Cueing for safety;Cueing for sequencing   Toileting- Clothing Manipulation and Hygiene: Contact guard assist       Functional mobility during ADLs: Contact guard assist;Supervision/safety;Cueing for sequencing;Cueing for  safety       Vision   Additional Comments: Making eye contact and able to read clock on wall.     Perception         Praxis         Pertinent Vitals/Pain Pain Assessment Pain Assessment: No/denies pain     Extremity/Trunk Assessment Upper Extremity Assessment Upper Extremity Assessment: Generalized weakness   Lower Extremity Assessment Lower Extremity Assessment: Generalized weakness   Cervical / Trunk Assessment Cervical / Trunk Assessment: Normal   Communication Communication Communication: No apparent difficulties Factors Affecting Communication: Difficulty expressing self (Some word finding struggles.)   Cognition Arousal: Alert Behavior During Therapy: Lability Cognition: Cognition impaired, No family/caregiver present to determine baseline   Orientation impairments: Time, Situation Awareness: Intellectual awareness impaired, Online awareness impaired Memory impairment (select all impairments): Short-term memory, Working Civil Service fast streamer, Engineer, structural memory Attention impairment (select first level of impairment): Sustained attention Executive functioning impairment (select all impairments): Initiation, Organization, Sequencing, Reasoning, Problem solving                   Following commands: Impaired Following commands impaired: Follows one step commands inconsistently, Follows one step commands with increased time     Cueing  General Comments   Cueing Techniques: Gestural cues;Verbal cues;Tactile cues      Exercises Other Exercises Other Exercises: Pt assisted with calling her sister. Pt unable to work her iphone due to confusion. Required assistance finding the phone app and dialing sister.   Shoulder Instructions      Home Living Family/patient expects to be discharged to:: Private residence (All information taken from previous Evaluation at [facility] ~7 months ago. Pt unable to give reliable PLOf information today.) Living Arrangements:  Alone Available Help at Discharge: Neighbor;Available PRN/intermittently Type of Home: House Home Access: Stairs to enter Entrance Stairs-Number of Steps: 1   Home Layout: Two level;Able to live on main level with bedroom/bathroom Alternate Level Stairs-Number of Steps: n/a   Bathroom Shower/Tub: Door;Walk-in Human resources officer: Standard Bathroom Accessibility: No   Home Equipment: Shower seat - built in;Grab bars - Chartered loss adjuster (2 wheels);BSC/3in1   Additional Comments: walks 5 mi every day      Prior Functioning/Environment Prior Level of Function : Independent/Modified Independent             Mobility Comments: ind ADLs Comments: ind. Does not drive, stated she will walk to grocery store or have delivered.    OT Problem List:     OT Treatment/Interventions: Self-care/ADL training;Balance training;Therapeutic exercise;Therapeutic activities;Cognitive remediation/compensation;DME and/or AE instruction;Patient/family education      OT Goals(Current goals can be found in the care plan section)   Acute Rehab OT Goals Patient Stated Goal: Per sister, help with discharge planning. OT Goal Formulation: With family Time For Goal Achievement: 01/03/24 Potential to Achieve Goals: Fair ADL Goals Pt Will Perform Lower Body Dressing: with modified independence Additional ADL Goal #1: Pt will engage in 10 min of standing functional activities such as making bed, gathering things from closet, or folding towels, without loss of standing balance, in order to demonstrate improved activity tolerance and balance needed to perform ADLs safely  at home. Additional ADL Goal #2: Pt will demonstrate improved mentation by scoring <4/10 on short blessed test and answering 4/4 safety questions from the KEL[facility] correctly:   1. What do you do for yourself if you are sick with a cold.    2. What do you do if you burn yourself and the wound becomes infected.    3. What do you do if  you experience severe chest pain and shortness of breath?   4. What number do you call in an emergency? Additional ADL Goal #3: Pt will complete toileting and will initiate thorough hygiene and hand hygiene without reminder cues, to determine discharge plans for least-restritive living environment.   OT Frequency:  Min 2X/week    Co-evaluation PT/OT/SLP Co-Evaluation/Treatment: Yes Reason for Co-Treatment: Necessary to address cognition/behavior during functional activity PT goals addressed during session: Mobility/safety with mobility OT goals addressed during session: ADL's and self-care (cognition)      AM-PAC OT 6 Clicks Daily Activity     Outcome Measure Help from another person eating meals?: A Little Help from another person taking care of personal grooming?: A Little Help from another person toileting, which includes using toliet, bedpan, or urinal?: A Little Help from another person bathing (including washing, rinsing, drying)?: A Little Help from another person to put on and taking off regular upper body clothing?: A Little Help from another person to put on and taking off regular lower body clothing?: A Little 6 Click Score: 18   End of Session Nurse Communication: Other (comment) (Okay to see)  Activity Tolerance: Patient tolerated treatment well Patient left: in bed;with call bell/phone within reach;with nursing/sitter in room  OT Visit Diagnosis: Other symptoms and signs involving cognitive function;Cognitive communication deficit (R41.841);Unsteadiness on feet (R26.81)                Time: 9047-8989 OT Time Calculation (min): 18 min Charges:  OT General Charges $OT Visit: 1 Visit OT Evaluation $OT Eval Low Complexity: 1 Low  Erric Machnik, OT Acute Rehab Services Office: [phone] 12/20/2023   Delon Falter 12/20/2023, 1:09 PM

## 2023-12-20 NOTE — Evaluation (Signed)
 Physical Therapy Evaluation Patient Details Name: Caitlyn [name] MRN: [id number] DOB: [birth year] Today's Date: 12/20/2023  History of Present Illness  Pt is a 78 yo female presenting to [facility] on 12/19/23 after EMS was called by patient's neighbor after she was found standing, undressed from waist down asking for underwear.  Pt admitted for AMS and attempted to leave the ED, so psychiatry was consulted and pt ibnvoluntarily commited to hospital.  Pt with past hospitalization this year to [facility] on 05/21/23 for epigastric pain. PMH of MI, HTN, HLD, GERD, IDA, Alzheimer's dementia.  Clinical Impression  Pt admitted with above diagnosis.  Pt currently with functional limitations due to the deficits listed below (see PT Problem List). Pt will benefit from acute skilled PT to increase their independence and safety with mobility to allow discharge.     Patient is physically mobilizing well, no need for AD, ambulated x 250' with supervision to CGA. Patient with significant cognitive impairments and memory deficits. Patient lives alone  in [place], sister lives in [place]. Patient clearly unable to live alone with significant memory deficits. Patient will require 24/7 close supervision for safety. DC pending sister communication with TOC.       If plan is discharge home, recommend the following: A little help with bathing/dressing/bathroom;Assistance with cooking/housework;Assist for transportation;Help with stairs or ramp for entrance;A little help with walking and/or transfers   Can travel by private vehicle   Yes    Equipment Recommendations None recommended by PT  Recommendations for Other Services       Functional Status Assessment Patient has had a recent decline in their functional status and demonstrates the ability to make significant improvements in function in a reasonable and predictable amount of time.     Precautions / Restrictions Precautions Precautions: Fall Recall of Precautions/Restrictions:  Impaired Precaution/Restrictions Comments: 1:1 Restrictions Weight Bearing Restrictions Per Provider Order: No      Mobility  Bed Mobility         Supine to sit: Supervision, HOB elevated          Transfers Overall transfer level: Needs assistance Equipment used: None Transfers: Sit to/from Stand Sit to Stand: Contact guard assist                Ambulation/Gait Ambulation/Gait assistance: Contact guard assist Gait Distance (Feet): 250 Feet Assistive device: None Gait Pattern/deviations: Step-through pattern   Gait velocity interpretation: <1.31 ft/sec, indicative of household ambulator   General Gait Details: gait steaDY NO LOSS OF BALANCE  Stairs            Wheelchair Mobility     Tilt Bed    Modified Rankin (Stroke Patients Only)       Balance Overall balance assessment: Mild deficits observed, not formally tested                                           Pertinent Vitals/Pain Pain Assessment Breathing: normal Negative Vocalization: none Facial Expression: smiling or inexpressive Body Language: relaxed Consolability: no need to console PAINAD Score: 0    Home Living Family/patient expects to be discharged to:: Private residence (All information taken from previous Evaluation at [facility] ~7 months ago. Pt unable to give reliable PLOf information today.) Living Arrangements: Alone Available Help at Discharge: Neighbor;Available PRN/intermittently Type of Home: House Home Access: Stairs to enter   Entrance Stairs-Number of Steps: 1 Alternate Level  Stairs-Number of Steps: n/a Home Layout: Two level;Able to live on main level with bedroom/bathroom Home Equipment: Shower seat - built in;Grab bars - tub/shower;Rolling Environmental consultant (2 wheels);BSC/3in1 Additional Comments: walks 5 mi every day    Prior Function Prior Level of Function : Independent/Modified Independent             Mobility Comments: ind ADLs Comments: ind.  Does not drive, stated she will walk to grocery store or have delivered.     Extremity/Trunk Assessment   Upper Extremity Assessment Upper Extremity Assessment: Generalized weakness    Lower Extremity Assessment Lower Extremity Assessment: Overall WFL for tasks assessed    Cervical / Trunk Assessment Cervical / Trunk Assessment: Normal  Communication   Communication Communication: No apparent difficulties Factors Affecting Communication: Difficulty expressing self    Cognition Arousal: Alert Behavior During Therapy: Lability                             Following commands: Impaired Following commands impaired: Follows one step commands inconsistently, Follows one step commands with increased time     Cueing Cueing Techniques: Gestural cues, Verbal cues, Tactile cues     General Comments      Exercises     Assessment/Plan    PT Assessment Patient needs continued PT services  PT Problem List Decreased strength;Decreased cognition;Decreased safety awareness;Decreased mobility       PT Treatment Interventions Gait training;Functional mobility training;Therapeutic exercise;Patient/family education    PT Goals (Current goals can be found in the Care Plan section)  Acute Rehab PT Goals Patient Stated Goal: not able PT Goal Formulation: With patient/family Time For Goal Achievement: 01/03/24 Potential to Achieve Goals: Good    Frequency Min 2X/week     Co-evaluation PT/OT/SLP Co-Evaluation/Treatment: Yes Reason for Co-Treatment: Necessary to address cognition/behavior during functional activity PT goals addressed during session: Mobility/safety with mobility OT goals addressed during session: ADL's and self-care       AM-PAC PT 6 Clicks Mobility  Outcome Measure Help needed turning from your back to your side while in a flat bed without using bedrails?: None Help needed moving from lying on your back to sitting on the side of a flat bed without  using bedrails?: None Help needed moving to and from a bed to a chair (including a wheelchair)?: None Help needed standing up from a chair using your arms (e.g., wheelchair or bedside chair)?: A Little Help needed to walk in hospital room?: A Little Help needed climbing 3-5 steps with a railing? : A Little 6 Click Score: 21    End of Session   Activity Tolerance: Patient tolerated treatment well Patient left: in bed;with call bell/phone within reach;with nursing/sitter in room Nurse Communication: Mobility status PT Visit Diagnosis: Unsteadiness on feet (R26.81);Difficulty in walking, not elsewhere classified (R26.2)    Time: 9045-8988 PT Time Calculation (min) (ACUTE ONLY): 17 min   Charges:   PT Evaluation $PT Eval Low Complexity: 1 Low   PT General Charges $$ ACUTE PT VISIT: 1 Visit         Darice Potters PT Acute Rehabilitation Services Office [phone]   Potters Darice Norris 12/20/2023, 1:38 PM

## 2023-12-20 NOTE — Plan of Care (Signed)
   Problem: Skin Integrity: Goal: Risk for impaired skin integrity will decrease Outcome: Progressing

## 2023-12-20 NOTE — Progress Notes (Addendum)
 PROGRESS NOTE  Caitlyn [name]  FMW:[id number] DOB: 03/22/1946 DOA: 12/19/2023 PCP: [name] LABOR, MD   Brief Narrative: Patient is a 78 year old female with history of Alzheimer's dementia, coronary artery disease status post PCI, hypothyroidism, hypertension, hyperlipidemia, GERD, IDA, arthritis who presented for the evaluation of altered mental status.  EMS was called by patient's neighbor after she was found standing at the elderly state undressed from waist down asking for underwear.  On presentation, she was pleasantly confused.  She was admitted for further evaluation of altered mental status, agitation.  Significant mostly hemodynamically stable.  UA was suspicious for UTI.  CT denies any acute endocrine abnormalities.  She tried to left the ED but due to unsafe disposition, psychiatry consulted, IVCed.  TOC following  Assessment & Plan:  Principal Problem:   Acute metabolic encephalopathy Active Problems:   Major neurocognitive disorder (HCC)   Acute cystitis without hematuria   Delirium   Dementia with behavioral disturbance (HCC)  Acute metabolic encephalopathy/delirium/history of Alzheimer's dementia: Presented with worsening confusion from baseline.  Could have been aggravated by UTI.  Patient lives alone with her dog. She tried to leave the ED due to unsafe disposition, she was [facility], psychiatry consulted.  Continue delirium precautions. There is concern about patient's ability to take care of herself due to recent increased confusion from baseline, misplacing belongings, and neglecting finances.  She might need placement at memory care.  TOC consulted.  PT/OT consulted. Psychiatry saw her here.  No need for inpatient psychiatric admission or initiation of any antipsychotic medication.  We will consider low-dose quetiapine  on an as-needed basis if she demonstrates agitation.  Will consider melatonin for insomnia  Suspected UTI: UA was suspicious for UTI.  Currently on  Keflex .  Urine culture.  No fever or leukocytosis  Hypertension: Elevation in systolic BP.  Continue Coreg  and amlodipine  from home  History of coronary artery disease: Status post PCI.  No anginal symptoms.  Currently on aspirin   Hypokalemia: Supplement with potassium           DVT prophylaxis:enoxaparin  (LOVENOX ) injection 40 mg Start: 12/20/23 0800     Code Status: Full Code  Family Communication: Called sister [name] for update on phone on 8/11, call not received  Patient status: Inpatient  Patient is from : Home  Anticipated discharge to: Not sure  Estimated DC date: not sure   Consultants: Psychiatry  Procedures: None  Antimicrobials:  Anti-infectives (From admission, onward)    Start     Dose/Rate Route Frequency Ordered Stop   12/19/23 2300  cephALEXin  (KEFLEX ) capsule 250 mg        250 mg Oral Every 8 hours 12/19/23 2248         Subjective: Patient seen and examined at the bedside today.  Hemodynamically stable.  Comfortably sitting on the bed.  Alert and awake.  Oriented to place only.  Sitter at bedside.  Not oriented to time or situation.  When asked if she has family, she thinks she has a mother who she does not know where she is living.  She does not know her address.  Does not appear agitated  Objective: Vitals:   12/20/23 0013 12/20/23 0036 12/20/23 0120 12/20/23 0516  BP:  (!) 145/81 (!) 143/88 126/65  Pulse:  89 85 77  Resp:  16 18 18   Temp:  98.6 F (37 C) 98.7 F (37.1 C) 97.9 F (36.6 C)  TempSrc:      SpO2: 96% 98% 99% 97%  Weight:  Height:       No intake or output data in the 24 hours ending 12/20/23 0833 Filed Weights   12/19/23 1703  Weight: 58.5 kg    Examination:  General exam: Overall comfortable, not in distress, pleasantly confused HEENT: PERRL Respiratory system:  no wheezes or crackles  Cardiovascular system: S1 & S2 heard, RRR.  Gastrointestinal system: Abdomen is nondistended, soft and  nontender. Central nervous system: Alert and wake, oriented to place only Extremities: No edema, no clubbing ,no cyanosis Skin: No rashes, no ulcers,no icterus     Data Reviewed: I have personally reviewed following labs and imaging studies  CBC: Recent Labs  Lab 12/19/23 1828 12/20/23 0515  WBC 7.2 5.9  NEUTROABS 4.8  --   HGB 14.3 12.7  HCT 43.4 39.0  MCV 88.4 88.4  PLT 248 210   Basic Metabolic Panel: Recent Labs  Lab 12/19/23 1828 12/20/23 0515  NA 140 137  K 3.6 3.4*  CL 105 105  CO2 27 24  GLUCOSE 124* 106*  BUN 21 20  CREATININE 0.63 0.61  CALCIUM  10.1 9.0     No results found for this or any previous visit (from the past 240 hours).   Radiology Studies: CT Head Wo Contrast Result Date: 12/19/2023 CLINICAL DATA:  Mental status change EXAM: CT HEAD WITHOUT CONTRAST TECHNIQUE: Contiguous axial images were obtained from the base of the skull through the vertex without intravenous contrast. RADIATION DOSE REDUCTION: This exam was performed according to the departmental dose-optimization program which includes automated exposure control, adjustment of the mA and/or kV according to patient size and/or use of iterative reconstruction technique. COMPARISON:  MRI 11/23/2010, CT brain 11/23/2010 FINDINGS: Brain: No acute territorial infarction, hemorrhage or intracranial mass. Atrophy progression. Moderate chronic small vessel ischemic changes of the white matter. Prominent ventricles, slightly increased in likely due to atrophy progression. Interval chronic appearing small left ganglial capsular infarct. Vascular: No hyperdense vessels.  Carotid vascular calcification Skull: Normal. Negative for fracture or focal lesion. Sinuses/Orbits: No acute finding. Other: None IMPRESSION: 1. No CT evidence for acute intracranial abnormality. 2. Atrophy and chronic small vessel ischemic changes of the white matter. Interval chronic appearing small left gangliocapsular infarct.  Electronically Signed   By: Luke Bun M.D.   On: 12/19/2023 18:00    Scheduled Meds:  amLODipine   5 mg Oral Daily   aspirin  EC  81 mg Oral Daily   carvedilol   6.25 mg Oral BID WC   cephALEXin   250 mg Oral Q8H   enoxaparin  (LOVENOX ) injection  40 mg Subcutaneous Q24H   potassium chloride   40 mEq Oral Once   Continuous Infusions:   LOS: 1 day   Ivonne Mustache, MD [facility] P8/03/2024, 8:33 AM

## 2023-12-20 NOTE — TOC Initial Note (Signed)
 Transition of Care [facility]) - Initial/Assessment Note    Patient Details  Name: Caitlyn [name] MRN: [id number] Date of Birth: [birth year]  Transition of Care [facility]) CM/SW Contact:    Heather DELENA Saltness, LCSW Phone Number: 12/20/2023, 4:12 PM  Clinical Narrative:                 CSW spoke with pt's sister, [name] [phone], via phone to discuss discharge planning and recommendation for SNF rehab. Pt's sister reports concerns for pt's safety due to currently living alone, history of dementia, and altered mental status. Pt's sister reports she lives in [place] , pt has sister-in-law who lives in [place], and a daughter/POA who she hasn't spoken to in 4 years. CSW advised pt's sister of PT recommending short-term rehab at [facility]. Pt's sister inquired about discharging planning once home from SNF. CSW advised pt's sister of facility handling discharge planning once pt is at [facility]. CSW completed FL2 and sent referrals to SNF locations in [place] and surrounding areas.    Expected Discharge Plan: Skilled Nursing Facility Barriers to Discharge: Continued Medical Work up   Patient Goals and CMS Choice Patient states their goals for this hospitalization and ongoing recovery are:: To go to SNF for rehab   Choice offered to / list presented to : Sibling [place] ownership interest in [employer].provided to:: Sibling    Expected Discharge Plan and Services In-house Referral: Clinical Social Work Discharge Planning Services: NA Post Acute Care Choice: Skilled Nursing Facility Living arrangements for the past 2 months: Skilled Nursing Facility                 DME Arranged: N/A DME Agency: NA       HH Arranged: NA HH Agency: NA        Prior Living Arrangements/Services Living arrangements for the past 2 months: Skilled Nursing Facility Lives with:: Self Patient language and need for interpreter reviewed:: Yes Do you feel safe going back to the place where you live?: No    AMS and not safe to live alone  Need for Family Participation in Patient Care: Yes (Comment) Care giver support system in place?: No (comment)   Criminal Activity/Legal Involvement Pertinent to Current Situation/Hospitalization: No - Comment as needed  Activities of Daily Living   ADL Screening (condition at time of admission) Independently performs ADLs?: No Does the patient have a NEW difficulty with bathing/dressing/toileting/self-feeding that is expected to last >3 days?: No Does the patient have a NEW difficulty with getting in/out of bed, walking, or climbing stairs that is expected to last >3 days?: Yes (Initiates electronic notice to provider for possible PT consult) Does the patient have a NEW difficulty with communication that is expected to last >3 days?: No Is the patient deaf or have difficulty hearing?: No Does the patient have difficulty seeing, even when wearing glasses/contacts?: No Does the patient have difficulty concentrating, remembering, or making decisions?: Yes  Permission Sought/Granted Permission sought to share information with : Family Supports Permission granted to share information with : Yes, Verbal Permission Granted  Share Information with NAME: Meta Roger     Permission granted to share info w Relationship: Sister  Permission granted to share info w Contact Information: [phone]  Emotional Assessment Appearance::  (UTA) Attitude/Demeanor/Rapport: Unable to Assess Affect (typically observed): Unable to Assess Orientation: : Oriented to Self, Oriented to Place Alcohol / Substance Use: Not Applicable Psych Involvement: No (comment)  Admission diagnosis:  Acute cystitis without hematuria [N30.00] Dementia  with behavioral disturbance (HCC) [F03.918] Acute metabolic encephalopathy [G93.41] Patient Active Problem List   Diagnosis Date Noted   Acute cystitis without hematuria 12/20/2023   Delirium 12/20/2023   Dementia with behavioral  disturbance (HCC) 12/20/2023   Major neurocognitive disorder (HCC) 12/19/2023   Acute metabolic encephalopathy 12/19/2023   Epigastric pain  Elevated Troponin 05/22/2023   Hx of myocardial infarction 05/22/2023   Memory changes 05/22/2023   Unstable angina (HCC) 11/28/2017   Ischemic cardiomyopathy 08/24/2017   Hypertension    Head injury    H/O hiatal hernia    GERD (gastroesophageal reflux disease)    E coli enteritis    Arthritis    Anemia    GI bleeding 06/29/2016   Status post hip replacement, right 06/29/2016   Acute GI bleeding    History of cardiac cath    Coronary artery disease 06/07/2016   Postop check    Displaced intertrochanteric fracture of right femur, initial encounter for closed fracture (HCC) 06/03/2016   Status post insertion of drug-eluting stent into left anterior descending (LAD) artery    NSTEMI (non-ST elevated myocardial infarction) (HCC)    Chest pain 05/30/2016   Hypothyroidism, acquired 05/28/2016   Closed fracture of right hip (HCC) 05/28/2016   Closed right hip fracture (HCC) 05/28/2016   Essential hypertension 10/29/2015   Dyslipidemia, goal LDL below 70 10/29/2015   Mass of right kidney 08/22/2013   Radicular pain of thoracic region 08/22/2013   Iron  deficiency anemia 02/22/2013   Iron  deficiency anemia, unspecified 02/22/2013   Bleeding diathesis (HCC) 02/22/2013   PCP:  [name] LABOR, MD Pharmacy:   [facility] - [place], [place] - [address] [place] [address] Phone: [phone] Fax: [phone]  [id number] - [place], [place] - [address] [place] [address] Phone: [phone] Fax: [phone]  [facility] - [place], [place] - [address] [place] [address] Phone: [phone] Fax: [phone]     Social Drivers of Health (SDOH) Social History: SDOH Screenings   Food  Insecurity: No Food Insecurity (12/20/2023)  Housing: Low Risk  (12/20/2023)  Transportation Needs: No Transportation Needs (12/20/2023)  Utilities: Not At Risk (12/20/2023)  Social Connections: Moderately Integrated (12/20/2023)  Tobacco Use: Low Risk  (12/19/2023)   SDOH Interventions:     Readmission Risk Interventions    12/20/2023    4:07 PM  Readmission Risk Prevention Plan  Post Dischage Appt Complete  Medication Screening Complete  Transportation Screening Complete    Signed: Heather Saltness, MSW, LCSW Clinical Social Worker Inpatient Care Management 12/20/2023 4:50 PM

## 2023-12-20 NOTE — NC FL2 (Signed)
 [place]  MEDICAID FL2 LEVEL OF CARE FORM     IDENTIFICATION  Patient Name: Caitlyn [name] Birthdate: 1945/10/23 Sex: female Admission Date (Current Location): 12/19/2023  [facility] and [place] Number:  Producer, television/film/video and Address:  [facility],  [address], [place] [address]      Provider Number: [id number]  Attending Physician Name and Address:  Jillian Buttery, MD  Relative Name and Phone Number:  [name] (sister) [phone]    Current Level of Care: Hospital Recommended Level of Care: Skilled Nursing Facility Prior Approval Number:    Date Approved/Denied:   PASRR Number: [id number]  Discharge Plan: SNF    Current Diagnoses: Patient Active Problem List   Diagnosis Date Noted   Acute cystitis without hematuria 12/20/2023   Delirium 12/20/2023   Dementia with behavioral disturbance (HCC) 12/20/2023   Major neurocognitive disorder (HCC) 12/19/2023   Acute metabolic encephalopathy 12/19/2023   Epigastric pain  Elevated Troponin 05/22/2023   Hx of myocardial infarction 05/22/2023   Memory changes 05/22/2023   Unstable angina (HCC) 11/28/2017   Ischemic cardiomyopathy 08/24/2017   Hypertension    Head injury    H/O hiatal hernia    GERD (gastroesophageal reflux disease)    E coli enteritis    Arthritis    Anemia    GI bleeding 06/29/2016   Status post hip replacement, right 06/29/2016   Acute GI bleeding    History of cardiac cath    Coronary artery disease 06/07/2016   Postop check    Displaced intertrochanteric fracture of right femur, initial encounter for closed fracture (HCC) 06/03/2016   Status post insertion of drug-eluting stent into left anterior descending (LAD) artery    NSTEMI (non-ST elevated myocardial infarction) (HCC)    Chest pain 05/30/2016   Hypothyroidism, acquired 05/28/2016   Closed fracture of right hip (HCC) 05/28/2016   Closed right hip fracture (HCC) 05/28/2016   Essential hypertension 10/29/2015    Dyslipidemia, goal LDL below 70 10/29/2015   Mass of right kidney 08/22/2013   Radicular pain of thoracic region 08/22/2013   Iron  deficiency anemia 02/22/2013   Iron  deficiency anemia, unspecified 02/22/2013   Bleeding diathesis (HCC) 02/22/2013    Orientation RESPIRATION BLADDER Height & Weight     Self, Place  Normal Continent Weight: 129 lb (58.5 kg) Height:  5' 4 (162.6 cm)  BEHAVIORAL SYMPTOMS/MOOD NEUROLOGICAL BOWEL NUTRITION STATUS      Continent Diet (Regular)  AMBULATORY STATUS COMMUNICATION OF NEEDS Skin   Supervision Verbally Normal                       Personal Care Assistance Level of Assistance  Bathing, Feeding, Dressing Bathing Assistance: Limited assistance Feeding assistance: Independent Dressing Assistance: Limited assistance     Functional Limitations Info  Sight, Hearing, Speech Sight Info: Adequate Hearing Info: Adequate Speech Info: Adequate    SPECIAL CARE FACTORS FREQUENCY  PT (By licensed PT), OT (By licensed OT)     PT Frequency: 5x per week OT Frequency: 5x per week            Contractures Contractures Info: Not present    Additional Factors Info  Code Status, Allergies Code Status Info: FULL Allergies Info: Cortone Acetate (Cortisone), Crestor  (Rosuvastatin  Calcium ), Demerol (Meperidine), Monosodium Glutamate, Morphine And Codeine, Prednisone, Shellfish Allergy, Sulfa Antibiotics, Hctz (Hydrochlorothiazide ), Readi-cat (Barium Sulfate), Xanax (Alprazolam), Ceftin (Cefuroxime), Flagyl (Metronidazole), Macrodantin (Nitrofurantoin), Praluent  (Alirocumab ), Repatha  (Evolocumab ), Wellbutrin (Bupropion), Benzoic Acid, Butrans (Buprenorphine), Cipro (Ciprofloxacin  Hcl), Influenza Vaccines           Current Medications (12/20/2023):  This is the current hospital active medication list Current Facility-Administered Medications  Medication Dose Route Frequency Provider Last Rate Last Admin   acetaminophen  (TYLENOL ) tablet 650 mg  650 mg  Oral Q6H PRN Amponsah, Prosper M, MD       Or   acetaminophen  (TYLENOL ) suppository 650 mg  650 mg Rectal Q6H PRN Lou Claretta HERO, MD       amLODipine  (NORVASC ) tablet 5 mg  5 mg Oral Daily Amponsah, Prosper M, MD   5 mg at 12/20/23 9070   aspirin  EC tablet 81 mg  81 mg Oral Daily Amponsah, Prosper M, MD   81 mg at 12/20/23 0930   bisacodyl  (DULCOLAX) EC tablet 5 mg  5 mg Oral Daily PRN Amponsah, Prosper M, MD       carvedilol  (COREG ) tablet 6.25 mg  6.25 mg Oral BID WC Amponsah, Prosper M, MD   6.25 mg at 12/20/23 9070   cephALEXin  (KEFLEX ) capsule 250 mg  250 mg Oral Q8H Adhikari, Amrit, MD   250 mg at 12/20/23 1407   enoxaparin  (LOVENOX ) injection 40 mg  40 mg Subcutaneous Q24H Amponsah, Prosper M, MD   40 mg at 12/20/23 9070   haloperidol  lactate (HALDOL ) injection 2.5 mg  2.5 mg Intravenous Q6H PRN Jillian Buttery, MD       ondansetron  (ZOFRAN ) tablet 4 mg  4 mg Oral Q6H PRN Amponsah, Prosper M, MD       Or   ondansetron  (ZOFRAN ) injection 4 mg  4 mg Intravenous Q6H PRN Amponsah, Prosper M, MD       Oral care mouth rinse  15 mL Mouth Rinse PRN Adhikari, Amrit, MD       QUEtiapine  (SEROQUEL ) tablet 12.5 mg  12.5 mg Oral QHS Adhikari, Amrit, MD       senna-docusate (Senokot-S) tablet 1 tablet  1 tablet Oral QHS PRN Amponsah, Prosper M, MD         Discharge Medications: Please see discharge summary for a list of discharge medications.  Relevant Imaging Results:  Relevant Lab Results:   Additional Information SSN: [id number]  Heather LABOR Denys Salinger, LCSW

## 2023-12-20 NOTE — ED Notes (Deleted)
 Is there anything you want to tell me directly?

## 2023-12-21 DIAGNOSIS — G9341 Metabolic encephalopathy: Secondary | ICD-10-CM | POA: Diagnosis not present

## 2023-12-21 LAB — BASIC METABOLIC PANEL WITH GFR
Anion gap: 8 (ref 5–15)
BUN: 20 mg/dL (ref 8–23)
CO2: 23 mmol/L (ref 22–32)
Calcium: 9.1 mg/dL (ref 8.9–10.3)
Chloride: 109 mmol/L (ref 98–111)
Creatinine, Ser: 0.55 mg/dL (ref 0.44–1.00)
GFR, Estimated: >60 mL/min (ref >60)
Glucose, Bld: 103 mg/dL — ABNORMAL HIGH (ref 70–99)
Potassium: 3.3 mmol/L — ABNORMAL LOW (ref 3.5–5.1)
Sodium: 140 mmol/L (ref 135–145)

## 2023-12-21 MED ORDER — POTASSIUM CHLORIDE CRYS ER 20 MEQ PO TBCR
40.0000 meq | EXTENDED_RELEASE_TABLET | Freq: Once | ORAL | Status: AC
  Administered 2023-12-21 (×2): 40 meq via ORAL
  Filled 2023-12-21: qty 2

## 2023-12-21 NOTE — Plan of Care (Signed)

## 2023-12-21 NOTE — TOC Progression Note (Addendum)
 Transition of Care [facility]) - Progression Note    Patient Details  Name: Caitlyn [name] MRN: [id number] Date of Birth: 05/04/1946  Transition of Care [facility]) CM/SW Contact  Heather DELENA Saltness, LCSW Phone Number: 12/21/2023, 3:43 PM  Clinical Narrative:    CSW met with pt and pt's sister, [name], at bedside to discuss discharge planning. CSW advised pt and sister of PT's recommendation for [facility] PT/OT services. Pt reports she is okay with [facility] services as long as she can continue to live at her home. CSW advised of concern of pt's cognition and safety living alone. Pt reports I know my memory and I don't think I have a problem. I can live alone by myself. I walk 5 miles a day with my neighbors and walk my dog. I'm not going to bow down to anyone saying I need to go to a facility because I know I don't need it. If you try to put me in a facility, I will hide so no one can ever find me. CSW attempted to calm pt down, reminding her that we are just having a conversation and nothing will happen overnight/immediately. Emotional support and encouragement provided to pt and sister.  CSW then spoke with pt's sister, [name], privately. Janelle expressed concerns of pt's safety and fear of pt discharging home with home health. Sister lives in [place]  and pt has no other family members in the area who can care for pt. [name] reports she is only staying in [place] until Friday. Janelle reports feelings of being overwhelmed since pt isn't qualifying for short-term rehab anymore. CSW educated pt's sister on resource for assisting with LTC placement. CSW provided pt's sister with contact card and pamphlet for [employer]. CSW submitted referral to [facility] at [facility]. [facility] to follow up with pt's sister. HH PT/OT set up with [facility]. TOC will continue to follow.   Expected Discharge Plan: Skilled Nursing Facility Barriers to Discharge: Continued Medical Work  up   Expected Discharge Plan and Services In-house Referral: Clinical Social Work Discharge Planning Services: NA Post Acute Care Choice: Skilled Nursing Facility Living arrangements for the past 2 months: Skilled Nursing Facility                 DME Arranged: N/A DME Agency: NA       HH Arranged: NA HH Agency: NA         Social Drivers of Health (SDOH) Interventions SDOH Screenings   Food Insecurity: No Food Insecurity (12/20/2023)  Housing: Low Risk  (12/20/2023)  Transportation Needs: No Transportation Needs (12/20/2023)  Utilities: Not At Risk (12/20/2023)  Social Connections: Moderately Integrated (12/20/2023)  Tobacco Use: Low Risk  (12/19/2023)    Readmission Risk Interventions    12/20/2023    4:07 PM  Readmission Risk Prevention Plan  Post Dischage Appt Complete  Medication Screening Complete  Transportation Screening Complete    Signed: Heather Saltness, MSW, LCSW Clinical Social Worker Inpatient Care Management 12/21/2023 4:01 PM

## 2023-12-21 NOTE — Progress Notes (Addendum)
 PROGRESS NOTE  Caitlyn [name]  FMW:[id number] DOB: [birth year] DOA: 12/19/2023 PCP: [name] LABOR, MD   Brief Narrative: Patient is a 78 year old female with history of Alzheimer's dementia, coronary artery disease status post PCI, hypothyroidism, hypertension, hyperlipidemia, GERD, IDA, arthritis who presented for the evaluation of altered mental status.  EMS was called by patient's neighbor after she was found standing at her neighbor's door undressed from waist down asking for underwear.  On presentation, she was pleasantly confused.  She was admitted for further evaluation of altered mental status, agitation.  Significant mostly hemodynamically stable.  UA was suspicious for UTI.  CT denies any acute endocrine abnormalities.  She tried to left the ED but due to unsafe disposition, psychiatry consulted, IVCed.  TOC following.  Plan for placement at SNF/memory care  Assessment & Plan:  Principal Problem:   Acute metabolic encephalopathy Active Problems:   Major neurocognitive disorder (HCC)   Acute cystitis without hematuria   Delirium   Dementia with behavioral disturbance (HCC)  Acute metabolic encephalopathy/delirium/history of Alzheimer's dementia: Presented with worsening confusion from baseline.  Could have been aggravated by UTI.  Patient lives alone with her dog. She tried to leave the ED due to unsafe disposition, she was [facility], psychiatry consulted.  Continue delirium precautions. There is concern about patient's ability to take care of herself due to recent increased confusion from baseline, misplacing belongings, and neglecting finances.  She might need placement at memory care.  TOC consulted.  PT/OT consulted. Psychiatry saw her here.  No need for inpatient psychiatric admission or initiation of any antipsychotic medication.  We started on  low-dose quetiapine   Suspected UTI: UA was suspicious for UTI.  Currently on Keflex .  Urine culture showing significant Ecoli.   Follow-up final culture report.  No fever or leukocytosis  Hypertension: BP stable.  Continue Coreg  and amlodipine  from home  History of coronary artery disease: Status post PCI.  No anginal symptoms.  Currently on aspirin   Hypokalemia: Supplemented with potassium          DVT prophylaxis:enoxaparin  (LOVENOX ) injection 40 mg Start: 12/20/23 0800     Code Status: Full Code  Family Communication: Called and discussed with sister Janelle for update on phone on 8/12  Patient status: Inpatient  Patient is from : Home  Anticipated discharge to: SNF  Estimated DC date: not sure   Consultants: Psychiatry  Procedures: None  Antimicrobials:  Anti-infectives (From admission, onward)    Start     Dose/Rate Route Frequency Ordered Stop   12/19/23 2300  cephALEXin  (KEFLEX ) capsule 250 mg        250 mg Oral Every 8 hours 12/19/23 2248 12/24/23 2159       Subjective: Patient seen and examined at bedside today.  Sitter at bedside.  Seems comfortable.  Lying in bed.  Alert and awake.  Knows that she is in the hospital but not aware about the date , month and time.  She states she is good and does not understand why she is here.  She says she wants to go home.  She is anticipating her sister to come today.  We discussed about placing her in a rehab or memory care.  I had a long discussion with her Sister [name] on the phone today.  She is coming from [place] .  She said that her sister does not have any family members besides her.  She agrees with plan for placement in SNF for memory care.  She agrees for IVC if  she tries to leave  Objective: Vitals:   12/20/23 0120 12/20/23 0516 12/20/23 1332 12/21/23 0517  BP: (!) 143/88 126/65 121/82 124/81  Pulse: 85 77 76 69  Resp: 18 18 16 18   Temp: 98.7 F (37.1 C) 97.9 F (36.6 C) 98.2 F (36.8 C) 97.8 F (36.6 C)  TempSrc:    Oral  SpO2: 99% 97% 97% 98%  Weight:      Height:        Intake/Output Summary (Last 24 hours) at  12/21/2023 1143 Last data filed at 12/21/2023 1123 Gross per 24 hour  Intake 1426 ml  Output 550 ml  Net 876 ml   Filed Weights   12/19/23 1703  Weight: 58.5 kg    Examination:  General exam: Overall comfortable, not in distress, pleasantly confused HEENT: PERRL Respiratory system:  no wheezes or crackles  Cardiovascular system: S1 & S2 heard, RRR.  Gastrointestinal system: Abdomen is nondistended, soft and nontender. Central nervous system: Alert and oriented to place only Extremities: No edema, no clubbing ,no cyanosis Skin: No rashes, no ulcers,no icterus     Data Reviewed: I have personally reviewed following labs and imaging studies  CBC: Recent Labs  Lab 12/19/23 1828 12/20/23 0515  WBC 7.2 5.9  NEUTROABS 4.8  --   HGB 14.3 12.7  HCT 43.4 39.0  MCV 88.4 88.4  PLT 248 210   Basic Metabolic Panel: Recent Labs  Lab 12/19/23 1828 12/20/23 0515 12/21/23 0513  NA 140 137 140  K 3.6 3.4* 3.3*  CL 105 105 109  CO2 27 24 23   GLUCOSE 124* 106* 103*  BUN 21 20 20   CREATININE 0.63 0.61 0.55  CALCIUM  10.1 9.0 9.1     Recent Results (from the past 240 hours)  Urine Culture     Status: Abnormal (Preliminary result)   Collection Time: 12/19/23  5:27 PM   Specimen: Urine, Clean Catch  Result Value Ref Range Status   Specimen Description   Final    URINE, CLEAN CATCH Performed at [facility], [address] [place], [place] [address]    Special Requests   Final    NONE Performed at [facility], [address], [place], [place] [address]    Culture (A)  Final    >=100,000 COLONIES/mL ESCHERICHIA COLI SUSCEPTIBILITIES TO FOLLOW Performed at [facility], [address] [place], [place] [address]    Report Status PENDING  Incomplete     Radiology Studies: CT Head Wo Contrast Result Date: 12/19/2023 CLINICAL DATA:  Mental status change EXAM: CT HEAD WITHOUT CONTRAST TECHNIQUE: Contiguous axial images were  obtained from the base of the skull through the vertex without intravenous contrast. RADIATION DOSE REDUCTION: This exam was performed according to the departmental dose-optimization program which includes automated exposure control, adjustment of the mA and/or kV according to patient size and/or use of iterative reconstruction technique. COMPARISON:  MRI 11/23/2010, CT brain 11/23/2010 FINDINGS: Brain: No acute territorial infarction, hemorrhage or intracranial mass. Atrophy progression. Moderate chronic small vessel ischemic changes of the white matter. Prominent ventricles, slightly increased in likely due to atrophy progression. Interval chronic appearing small left ganglial capsular infarct. Vascular: No hyperdense vessels.  Carotid vascular calcification Skull: Normal. Negative for fracture or focal lesion. Sinuses/Orbits: No acute finding. Other: None IMPRESSION: 1. No CT evidence for acute intracranial abnormality. 2. Atrophy and chronic small vessel ischemic changes of the white matter. Interval chronic appearing small left gangliocapsular infarct. Electronically Signed  By: Luke Bun M.D.   On: 12/19/2023 18:00    Scheduled Meds:  amLODipine   5 mg Oral Daily   aspirin  EC  81 mg Oral Daily   carvedilol   6.25 mg Oral BID WC   cephALEXin   250 mg Oral Q8H   enoxaparin  (LOVENOX ) injection  40 mg Subcutaneous Q24H   QUEtiapine   12.5 mg Oral QHS   Continuous Infusions:   LOS: 2 days   Ivonne Mustache, MD [facility] P8/04/2024, 11:43 AM

## 2023-12-21 NOTE — Progress Notes (Signed)
 Mobility Specialist - Progress Note  Post-mobility: 79 bpm HR, 126/93 mmHg (104 MAP) BP    12/21/23 0938  Mobility  Activity Ambulated independently  Level of Assistance Standby assist, set-up cues, supervision of patient - no hands on  Assistive Device Other (Comment) (Hallway Rails)  Distance Ambulated (ft) 450 ft  Range of Motion/Exercises Active  Activity Response Tolerated well  Mobility Referral Yes  Mobility visit 1 Mobility  Mobility Specialist Start Time (ACUTE ONLY) 0920  Mobility Specialist Stop Time (ACUTE ONLY) [id number]  Mobility Specialist Time Calculation (min) (ACUTE ONLY) 18 min   Pt was found in bed and agreeable to ambulate. Stated using hallway rails due to feeling dizzy. BP taken after ambulation and recorded above. At EOS returned to bed with all needs met. Sitter in room.  Erminio Leos,  Mobility Specialist Can be reached via Secure Chat

## 2023-12-21 NOTE — Progress Notes (Signed)
 Physical therapy note/Addendum- patient has physically  improved in ambulation and will benefit from HHPT as opposed to SNF. Patient currently should have 24/7  supervision due to  [facility] deficits. Darice Potters PT Acute Rehabilitation Services Office [phone]

## 2023-12-22 DIAGNOSIS — G9341 Metabolic encephalopathy: Secondary | ICD-10-CM | POA: Diagnosis not present

## 2023-12-22 LAB — URINE CULTURE: Culture: >=100000 — AB

## 2023-12-22 LAB — BASIC METABOLIC PANEL WITH GFR
Anion gap: 8 (ref 5–15)
BUN: 22 mg/dL (ref 8–23)
CO2: 21 mmol/L — ABNORMAL LOW (ref 22–32)
Calcium: 8.9 mg/dL (ref 8.9–10.3)
Chloride: 109 mmol/L (ref 98–111)
Creatinine, Ser: 0.56 mg/dL (ref 0.44–1.00)
GFR, Estimated: >60 mL/min (ref >60)
Glucose, Bld: 113 mg/dL — ABNORMAL HIGH (ref 70–99)
Potassium: 3.6 mmol/L (ref 3.5–5.1)
Sodium: 138 mmol/L (ref 135–145)

## 2023-12-22 MED ORDER — HALOPERIDOL LACTATE 5 MG/ML IJ SOLN: 2.0000 mg | Freq: Four times a day (QID) | INTRAMUSCULAR | Status: DC | PRN

## 2023-12-22 NOTE — Plan of Care (Signed)
   Problem: Activity: Goal: Risk for activity intolerance will decrease Outcome: Progressing   Problem: Coping: Goal: Level of anxiety will decrease Outcome: Progressing

## 2023-12-22 NOTE — TOC Progression Note (Signed)
 Transition of Care [facility]) - Progression Note    Patient Details  Name: Caitlyn [name] MRN: [id number] Date of Birth: 1946/03/04  Transition of Care [facility]) CM/SW Contact  Sheri ONEIDA Sharps, [place] Phone Number: 12/22/2023, 5:20 PM  Clinical Narrative:    Pt currently unable to return home living alone as pt sister [name] will be returning to [place] on Saturday. Pt does not have LTC Medicaid and sister states pt or family does not have the funds to pay for placement. Placement is not cover under pt current ins. Pt sister is working with placement referral source [employer] to explore long term care options. However, that will take time to finalize. CSW met with pt and pt sister to discuss process of applying pt for LTC medicaid. Pt do not agree w/ plan for placement, pt wishes to return home. CSW discuss safety concerns regarding pt return home alone. Pt states she has neighbors that can assist her, which cannot be confirmed. Pt is adamant about returning home and states that  You will not make me go anywhere else but back to my home. Pt currently under IVC. CSW initiated APS report. Pt sister states that she will start medicaid application on pt behalf. Possible need for guardianship. TOC following to assist with safe dc plan for pt.   Expected Discharge Plan: Skilled Nursing Facility Barriers to Discharge: Continued Medical Work up               Expected Discharge Plan and Services In-house Referral: Clinical Social Work Discharge Planning Services: NA Post Acute Care Choice: Skilled Nursing Facility Living arrangements for the past 2 months: Skilled Nursing Facility                 DME Arranged: N/A DME Agency: NA       HH Arranged: NA HH Agency: NA         Social Drivers of Health (SDOH) Interventions SDOH Screenings   Food Insecurity: No Food Insecurity (12/20/2023)  Housing: Low Risk  (12/20/2023)  Transportation Needs: No Transportation Needs (12/20/2023)   Utilities: Not At Risk (12/20/2023)  Social Connections: Moderately Integrated (12/20/2023)  Tobacco Use: Low Risk  (12/19/2023)    Readmission Risk Interventions    12/20/2023    4:07 PM  Readmission Risk Prevention Plan  Post Dischage Appt Complete  Medication Screening Complete  Transportation Screening Complete

## 2023-12-22 NOTE — Progress Notes (Signed)
 PROGRESS NOTE  Caitlyn [name]  FMW:[id number] DOB: [birth year] DOA: 12/19/2023 PCP: [name] LABOR, MD   Brief Narrative: Patient is a 78 year old female with history of Alzheimer's dementia, coronary artery disease status post PCI, hypothyroidism, hypertension, hyperlipidemia, GERD, IDA, arthritis who presented for the evaluation of altered mental status.  EMS was called by patient's neighbor after she was found standing at her neighbor's door undressed from waist down asking for underwear.  On presentation, she was pleasantly confused.  She was admitted for further evaluation of altered mental status, agitation. UA was suspicious for UTI.  CT denies any acute endocrine abnormalities.  She tried to left the ED but due to unsafe disposition, psychiatry consulted, IVCed.  TOC following and assisting with safe disposition.  Patient is not stable to be without supervision due to her dementia.  Assessment & Plan:   Acute metabolic encephalopathy/delirium/history of Alzheimer's dementia: Presented with worsening confusion from baseline.  Could have been aggravated by UTI.  Patient lives alone with her dog. She tried to leave the ED due to unsafe disposition, she was [facility], psychiatry consulted.  Continue delirium precautions. There is concern about patient's ability to take care of herself due to recent increased confusion from baseline, misplacing belongings, and neglecting finances.  She might need placement at memory care.    - Evaluated by psychiatry-no need for inpatient psychiatric admission or initiation of any antipsychotic medication.  - Started on Seroquel  -Has been agitated today and IV has come out-as needed IM Haldol  ordered - TOC assisting with disposition  Suspected UTI: UA was suspicious for UTI.  Started on cephalexin  on 12/19/2023-urine culture growing E. coli which is pansensitive  Hypertension: BP stable.  Continue Coreg  and amlodipine     History of coronary artery disease:  Status post PCI.  No anginal symptoms.  Currently on aspirin   Hypokalemia: Supplemented with potassium          DVT prophylaxis:enoxaparin  (LOVENOX ) injection 40 mg Start: 12/20/23 0800     Code Status: Full Code   Consultants: Psychiatry   Antimicrobials:  Anti-infectives (From admission, onward)    Start     Dose/Rate Route Frequency Ordered Stop   12/19/23 2300  cephALEXin  (KEFLEX ) capsule 250 mg        250 mg Oral Every 8 hours 12/19/23 2248 12/24/23 2159       Subjective: Quite confused.  She has no complaints and is asking to be discharged home.  Objective: Vitals:   12/21/23 1419 12/21/23 2004 12/22/23 0619 12/22/23 1147  BP: 128/82 (!) 144/83 128/73 (!) 142/72  Pulse: 78 87 68 77  Resp: 20 18 20 16   Temp: 98.1 F (36.7 C) 98.8 F (37.1 C) 97.6 F (36.4 C) 97.9 F (36.6 C)  TempSrc: Oral Oral Axillary   SpO2: 97% 100% 97% 97%  Weight:      Height:        Intake/Output Summary (Last 24 hours) at 12/22/2023 1823 Last data filed at 12/22/2023 0755 Gross per 24 hour  Intake 240 ml  Output --  Net 240 ml   Filed Weights   12/19/23 1703  Weight: 58.5 kg    Examination:  General exam: Overall comfortable, not in distress, pleasantly confused HEENT: PERRL Respiratory system:  no wheezes or crackles  Cardiovascular system: S1 & S2 heard, RRR.  Gastrointestinal system: Abdomen is nondistended, soft and nontender. Central nervous system: Alert and oriented to person and place Extremities: No edema, no clubbing ,no cyanosis Skin: No rashes, no ulcers,no  icterus     Data Reviewed: I have personally reviewed following labs and imaging studies  CBC: Recent Labs  Lab 12/19/23 1828 12/20/23 0515  WBC 7.2 5.9  NEUTROABS 4.8  --   HGB 14.3 12.7  HCT 43.4 39.0  MCV 88.4 88.4  PLT 248 210   Basic Metabolic Panel: Recent Labs  Lab 12/19/23 1828 12/20/23 0515 12/21/23 0513 12/22/23 0524  NA 140 137 140 138  K 3.6 3.4* 3.3* 3.6  CL 105 105  109 109  CO2 27 24 23  21*  GLUCOSE 124* 106* 103* 113*  BUN 21 20 20 22   CREATININE 0.63 0.61 0.55 0.56  CALCIUM  10.1 9.0 9.1 8.9        Radiology Studies: No results found.   Scheduled Meds:  amLODipine   5 mg Oral Daily   aspirin  EC  81 mg Oral Daily   carvedilol   6.25 mg Oral BID WC   cephALEXin   250 mg Oral Q8H   enoxaparin  (LOVENOX ) injection  40 mg Subcutaneous Q24H   QUEtiapine   12.5 mg Oral QHS       LOS: 3 days   Elanie Hammitt, MD [facility] P8/13/2025, 6:23 PM

## 2023-12-22 NOTE — Progress Notes (Signed)
 Physical Therapy Treatment Patient Details Name: Caitlyn [name] MRN: [id number] DOB: 03-03-1946 Today's Date: 12/22/2023   History of Present Illness Pt is a 78 yo female presenting to [facility] on 12/19/23 after EMS was called by patient's neighbor after she was found standing, undressed from waist down asking for underwear.  Pt admitted for AMS and attempted to leave the ED, so psychiatry was consulted and pt ibnvoluntarily commited to hospital.  Pt with past hospitalization this year to [facility] on 05/21/23 for epigastric pain. PMH of MI, HTN, HLD, GERD, IDA, Alzheimer's dementia.    PT Comments  Pt received sitting EOB with sister in room. Focused session on mobility and progressive gait training without assistive device. Pt demonstrated good dynamic balance on level surface, appears safe without assistive device. Current safety issue is focused more on cognitive deficits affecting safe d/c. Will continue to follow as needed acutely per POC.   If plan is discharge home, recommend the following: A little help with bathing/dressing/bathroom;Assistance with cooking/housework;Assist for transportation;Help with stairs or ramp for entrance;A little help with walking and/or transfers   Can travel by private vehicle     Yes  Equipment Recommendations  None recommended by PT    Recommendations for Other Services       Precautions / Restrictions Precautions Precautions: Fall Recall of Precautions/Restrictions: Impaired Precaution/Restrictions Comments: 1:1 Restrictions Weight Bearing Restrictions Per Provider Order: No     Mobility  Bed Mobility Overal bed mobility: Needs Assistance Bed Mobility: Supine to Sit     Supine to sit: Modified independent (Device/Increase time)          Transfers Overall transfer level: Needs assistance Equipment used: None Transfers: Sit to/from Stand Sit to Stand: Supervision                Ambulation/Gait Ambulation/Gait assistance:  Supervision Gait Distance (Feet): 250 Feet Assistive device: None Gait Pattern/deviations: Step-through pattern       General Gait Details: Steady without LOB or need for assistive device   Stairs             Wheelchair Mobility     Tilt Bed    Modified Rankin (Stroke Patients Only)       Balance Overall balance assessment: Mild deficits observed, not formally tested (No LOB while ambulating, negotiating around objects in hall, or directional changes)                                          Communication Communication Communication: No apparent difficulties  Cognition Arousal: Alert Behavior During Therapy: WFL for tasks assessed/performed   PT - Cognitive impairments: History of cognitive impairments                       PT - Cognition Comments:  (Concerns for safe return home due to significant cognitive impairments and poor safety awareness) Following commands: Impaired Following commands impaired: Follows one step commands inconsistently, Follows one step commands with increased time    Cueing Cueing Techniques: Gestural cues, Verbal cues, Tactile cues  Exercises      General Comments General comments (skin integrity, edema, etc.):  (Pt educated on role of acute PT and benefits of mobility)      Pertinent Vitals/Pain Pain Assessment Pain Assessment: No/denies pain    Home Living  Prior Function            PT Goals (current goals can now be found in the care plan section) Acute Rehab PT Goals Patient Stated Goal: not able Progress towards PT goals: Progressing toward goals    Frequency    Min 2X/week      PT Plan      Co-evaluation              AM-PAC PT 6 Clicks Mobility   Outcome Measure  Help needed turning from your back to your side while in a flat bed without using bedrails?: None Help needed moving from lying on your back to sitting on the side of a  flat bed without using bedrails?: None Help needed moving to and from a bed to a chair (including a wheelchair)?: None Help needed standing up from a chair using your arms (e.g., wheelchair or bedside chair)?: A Little Help needed to walk in hospital room?: A Little Help needed climbing 3-5 steps with a railing? : A Little 6 Click Score: 21    End of Session   Activity Tolerance: Patient tolerated treatment well Patient left: in chair;with call bell/phone within reach;with family/visitor present;with nursing/sitter in room Nurse Communication: Mobility status PT Visit Diagnosis: Unsteadiness on feet (R26.81);Difficulty in walking, not elsewhere classified (R26.2)     Time: 8395-8379 PT Time Calculation (min) (ACUTE ONLY): 16 min  Charges:    $Gait Training: 8-22 mins PT General Charges $$ ACUTE PT VISIT: 1 Visit                    Darice Bohr, PTA  Darice JAYSON Bohr 12/22/2023, 4:57 PM

## 2023-12-22 NOTE — Plan of Care (Signed)
  Problem: Clinical Measurements: Goal: Will remain free from infection Outcome: Progressing   Problem: Coping: Goal: Level of anxiety will decrease Outcome: Progressing   Problem: Safety: Goal: Ability to remain free from injury will improve Outcome: Progressing   Problem: Health Behavior/Discharge Planning: Goal: Ability to manage health-related needs will improve Outcome: Not Met (add Reason)   Problem: Activity: Goal: Risk for activity intolerance will decrease Outcome: Completed/Met   Problem: Nutrition: Goal: Adequate nutrition will be maintained Outcome: Completed/Met

## 2023-12-23 DIAGNOSIS — G9341 Metabolic encephalopathy: Secondary | ICD-10-CM | POA: Diagnosis not present

## 2023-12-23 MED ORDER — ALUM & MAG HYDROXIDE-SIMETH 200-200-20 MG/5ML PO SUSP
30.0000 mL | ORAL | Status: DC | PRN
  Administered 2023-12-23 – 2024-01-02 (×5): 30 mL via ORAL
  Filled 2023-12-23 (×5): qty 30

## 2023-12-23 NOTE — Plan of Care (Signed)
   Problem: Clinical Measurements: Goal: Respiratory complications will improve Outcome: Progressing   Problem: Coping: Goal: Level of anxiety will decrease Outcome: Progressing

## 2023-12-23 NOTE — TOC Progression Note (Signed)
 Transition of Care [facility]) - Progression Note    Patient Details  Name: Caitlyn [name] MRN: [id number] Date of Birth: Apr 20, 1946  [facility] CM/SW Contact  Sonda Manuella Quill, RN Phone Number: 12/23/2023, 4:20 PM  Clinical Narrative:    Spoke w/ pt's sister [name] [phone]) at bedside; she has started Medicaid application process (case # [id number]); she has questions regarding signature required on paperwork; pt's eligibility CW at social services is Evelene Cotton [phone]); since immediate assistance requested, pt's sister spoke w/ Rep Placido [phone]); Ms Herns said someone from social services will contact her within 24-48 hours; pt's sister also given list of bed offers:  [facility]  [facility]  Awaiting choice; ins auth; also IVC expires 12/26/23 at 2101.   Expected Discharge Plan: Skilled Nursing Facility Barriers to Discharge: Continued Medical Work up               Expected Discharge Plan and Services In-house Referral: Clinical Social Work Discharge Planning Services: NA Post Acute Care Choice: Skilled Nursing Facility Living arrangements for the past 2 months: Skilled Nursing Facility                 DME Arranged: N/A DME Agency: NA       HH Arranged: NA HH Agency: NA         Social Drivers of Health (SDOH) Interventions SDOH Screenings   Food Insecurity: No Food Insecurity (12/20/2023)  Housing: Low Risk  (12/20/2023)  Transportation Needs: No Transportation Needs (12/20/2023)  Utilities: Not At Risk (12/20/2023)  Social Connections: Moderately Integrated (12/20/2023)  Tobacco Use: Low Risk  (12/19/2023)    Readmission Risk Interventions    12/20/2023    4:07 PM  Readmission Risk Prevention Plan  Post Dischage Appt Complete  Medication Screening Complete  Transportation Screening Complete

## 2023-12-23 NOTE — Progress Notes (Signed)
 PROGRESS NOTE  Caitlyn [name]  FMW:[id number] DOB: 1945/06/16 DOA: 12/19/2023 PCP: [name] LABOR, MD   Brief Narrative: Patient is a 78 year old female with history of Alzheimer's dementia, coronary artery disease status post PCI, hypothyroidism, hypertension, hyperlipidemia, GERD, IDA, arthritis who presented for the evaluation of altered mental status.  EMS was called by patient's neighbor after she was found standing at her neighbor's door undressed from waist down asking for underwear. Per Triage note, she was more confused than usual.  On presentation, she was pleasantly confused.  She was admitted for further evaluation of altered mental status, agitation. UA was suspicious for UTI.   She repeatedly asked leave from the ED but due to unsafe disposition, psychiatry consulted, IVCed.  TOC following and assisting with safe disposition.  Patient is not stable to be without supervision due to her dementia.   Subjective: Remains severely confused and is angry- states she has not seen a doctor since she has been here. She has no complaints.    Assessment & Plan:   Acute metabolic encephalopathy/delirium/history of Alzheimer's dementia: Presented with worsening confusion from baseline per neighbors.  Could have been aggravated by UTI.  Patient lives alone with her dog. There is concern about patient's ability to take care of herself due to recent increased confusion from baseline, misplacing belongings, and neglecting finances.    - Evaluated by psychiatry-no need for inpatient psychiatric admission or initiation of any antipsychotic medication.  - Psychiatry specifically recommends to avoid benzodiazepines and anticholinergics-reversible tests - Started on Seroquel  12.5 mg at bedtime which she appears to be tolerating well- is sleeping at night -Unfortunately, mental status has not improved with treating her UTI - TOC assisting with disposition  Suspected UTI: UA was suspicious for UTI.  Started on cephalexin  on 12/19/2023-urine culture growing E. coli which is pansensitive  Hypertension: BP stable.  Continue Coreg  and amlodipine     History of coronary artery disease: Status post PCI.  No anginal symptoms.  Currently on aspirin   Hypokalemia: replaced  POA: POA is her daughter whom the patient is estranged from>  [name]- [phone]       DVT prophylaxis:enoxaparin  (LOVENOX ) injection 40 mg Start: 12/20/23 0800     Code Status: Full Code   Consultants: Psychiatry   Antimicrobials:  Anti-infectives (From admission, onward)    Start     Dose/Rate Route Frequency Ordered Stop   12/19/23 2300  cephALEXin  (KEFLEX ) capsule 250 mg        250 mg Oral Every 8 hours 12/19/23 2248 12/24/23 2159        Objective: Vitals:   12/23/23 0534 12/23/23 1000 12/23/23 1002 12/23/23 1253  BP: (!) 141/73 126/70 126/70 136/78  Pulse: 68  85 86  Resp: 17     Temp: 98 F (36.7 C)   (!) 97.2 F (36.2 C)  TempSrc: Oral   Oral  SpO2: 97%  99% 97%  Weight:      Height:        Intake/Output Summary (Last 24 hours) at 12/23/2023 1855 Last data filed at 12/23/2023 1340 Gross per 24 hour  Intake 240 ml  Output --  Net 240 ml   Filed Weights   12/19/23 1703  Weight: 58.5 kg    Examination:  General exam: Overall comfortable, not in distress, poor short term memory,  HEENT: PERRL Respiratory system:  no wheezes or crackles  Cardiovascular system: S1 & S2 heard, RRR.  Gastrointestinal system: Abdomen is nondistended, soft and nontender. Central nervous  system: Alert and oriented to person and place Extremities: No edema, no clubbing ,no cyanosis Skin: No rashes, no ulcers,no icterus     Data Reviewed: I have personally reviewed following labs and imaging studies  CBC: Recent Labs  Lab 12/19/23 1828 12/20/23 0515  WBC 7.2 5.9  NEUTROABS 4.8  --   HGB 14.3 12.7  HCT 43.4 39.0  MCV 88.4 88.4  PLT 248 210   Basic Metabolic Panel: Recent Labs   Lab 12/19/23 1828 12/20/23 0515 12/21/23 0513 12/22/23 0524  NA 140 137 140 138  K 3.6 3.4* 3.3* 3.6  CL 105 105 109 109  CO2 27 24 23  21*  GLUCOSE 124* 106* 103* 113*  BUN 21 20 20 22   CREATININE 0.63 0.61 0.55 0.56  CALCIUM  10.1 9.0 9.1 8.9        Radiology Studies: No results found.   Scheduled Meds:  amLODipine   5 mg Oral Daily   aspirin  EC  81 mg Oral Daily   carvedilol   6.25 mg Oral BID WC   cephALEXin   250 mg Oral Q8H   enoxaparin  (LOVENOX ) injection  40 mg Subcutaneous Q24H   QUEtiapine   12.5 mg Oral QHS       LOS: 4 days   Jamesha Ellsworth, MD [facility] P8/14/2025, 6:55 PM

## 2023-12-23 NOTE — Progress Notes (Signed)
 Occupational Therapy Treatment Patient Details Name: Caitlyn [name] MRN: [id number] DOB: 06-27-1945 Today's Date: 12/23/2023   History of present illness Pt is a 78 yo female presenting to [facility] on 12/19/23 after EMS was called by patient's neighbor after she was found standing, undressed from waist down asking for underwear.  Pt admitted for AMS and attempted to leave the ED, so psychiatry was consulted and pt ibnvoluntarily commited to hospital.  Pt with past hospitalization this year to [facility] on 05/21/23 for epigastric pain. PMH of MI, HTN, HLD, GERD, IDA, Alzheimer's dementia.   OT comments  Pt making good progress with functional goals; continues to have poor safety awareness, poor insight into deficits and should have 24/7 supervision due to memory deficits. OT will continue to follow  acutely      If plan is discharge home, recommend the following:  A little help with walking and/or transfers;A little help with bathing/dressing/bathroom;Supervision due to cognitive status   Equipment Recommendations  None recommended by OT    Recommendations for Other Services      Precautions / Restrictions Precautions Precautions: Fall;Other (comment) Recall of Precautions/Restrictions: Impaired Precaution/Restrictions Comments: 1:1 sup Restrictions Weight Bearing Restrictions Per Provider Order: No       Mobility Bed Mobility Overal bed mobility: Needs Assistance Bed Mobility: Supine to Sit     Supine to sit: Modified independent (Device/Increase time)          Transfers Overall transfer level: Needs assistance Equipment used: None Transfers: Sit to/from Stand Sit to Stand: Supervision                 Balance Overall balance assessment: Mild deficits observed, not formally tested                                         ADL either performed or assessed with clinical judgement   ADL Overall ADL's : Needs assistance/impaired     Grooming: Wash/dry  hands;Wash/dry face;Supervision/safety;Sitting   Upper Body Bathing: Supervision/ safety;Standing;Cueing for safety   Lower Body Bathing: Contact guard assist;Sit to/from stand;Cueing for safety       Lower Body Dressing: Sitting/lateral leans;Supervision/safety   Toilet Transfer: Supervision/safety;Contact guard assist;Ambulation;Cueing for safety;Cueing for sequencing   Toileting- Clothing Manipulation and Hygiene: Supervision/safety;Sitting/lateral lean;Sit to/from stand       Functional mobility during ADLs: Contact guard assist;Supervision/safety;Cueing for sequencing;Cueing for safety      Extremity/Trunk Assessment Upper Extremity Assessment Upper Extremity Assessment: Generalized weakness   Lower Extremity Assessment Lower Extremity Assessment: Defer to PT evaluation        Vision Ability to See in Adequate Light: 0 Adequate Patient Visual Report: No change from baseline     Perception     Praxis     Communication Communication Communication: No apparent difficulties   Cognition Arousal: Alert Behavior During Therapy: WFL for tasks assessed/performed Cognition: Cognition impaired, No family/caregiver present to determine baseline   Orientation impairments: Time, Situation   Memory impairment (select all impairments): Short-term memory, Working Biochemist, clinical functioning impairment (select all impairments): Sequencing, Reasoning, Problem solving OT - Cognition Comments: pt became agitiated without being able to recall month and year                 Following commands: Impaired Following commands impaired: Follows one step commands inconsistently, Follows one step commands with increased time      Cueing  Cueing Techniques: Gestural cues, Verbal cues, Tactile cues  Exercises      Shoulder Instructions       General Comments      Pertinent Vitals/ Pain       Pain Assessment Pain Assessment: No/denies pain  Home Living                                           Prior Functioning/Environment              Frequency  Min 2X/week        Progress Toward Goals  OT Goals(current goals can now be found in the care plan section)  Progress towards OT goals: Progressing toward goals     Plan      Co-evaluation                 AM-PAC OT 6 Clicks Daily Activity     Outcome Measure   Help from another person eating meals?: None Help from another person taking care of personal grooming?: A Little Help from another person toileting, which includes using toliet, bedpan, or urinal?: A Little Help from another person bathing (including washing, rinsing, drying)?: A Little Help from another person to put on and taking off regular upper body clothing?: A Little Help from another person to put on and taking off regular lower body clothing?: A Little 6 Click Score: 19    End of Session    OT Visit Diagnosis: Other symptoms and signs involving cognitive function;Cognitive communication deficit (R41.841);Unsteadiness on feet (R26.81)   Activity Tolerance Patient tolerated treatment well   Patient Left Other (comment) (standing at room doorway with RN present)   Nurse Communication Mobility status        Time: 8652-8593 OT Time Calculation (min): 19 min  Charges: OT General Charges $OT Visit: 1 Visit OT Treatments $Self Care/Home Management : 8-22 mins    Jacques Karna Loose 12/23/2023, 2:29 PM

## 2023-12-24 DIAGNOSIS — G9341 Metabolic encephalopathy: Secondary | ICD-10-CM | POA: Diagnosis not present

## 2023-12-24 NOTE — Progress Notes (Signed)
 Physical Therapy Treatment Patient Details Name: Caitlyn [name] MRN: [id number] DOB: 1946/04/12 Today's Date: 12/24/2023   History of Present Illness Pt is a 78 yo female presenting to [facility] on 12/19/23 after EMS was called by patient's neighbor after she was found standing, undressed from waist down asking for underwear.  Pt admitted for AMS and attempted to leave the ED, so psychiatry was consulted and pt ibnvoluntarily commited to hospital.  Pt with past hospitalization this year to [facility] on 05/21/23 for epigastric pain. PMH of MI, HTN, HLD, GERD, IDA, Alzheimer's dementia.    PT Comments   Pt admitted with above diagnosis.  Pt currently with functional limitations due to the deficits listed below (see PT Problem List). Pt in personal room seated in standard chair and elected to amb to window IND no AD no overt LOB. Pt agreeable to gait tasks in hallway with pt requiring S for safety and direction, no AD pt demonstrating normalized gait pattern with good cadence and no LOB for 500 feet. Pt is able to accept external perturbation standing no UE support. Pt denies pain, dizziness and SOB. Pt left in hallway with sister and friend with sitter. Pt will require 24/7 supervision and assist in next venue. Pt will benefit from acute skilled PT to increase their independence and safety with mobility to allow discharge.      If plan is discharge home, recommend the following: A little help with bathing/dressing/bathroom;Assistance with cooking/housework;Assist for transportation;Help with stairs or ramp for entrance;A little help with walking and/or transfers;Direct supervision/assist for medications management;Direct supervision/assist for financial management;Supervision due to cognitive status   Can travel by private vehicle     Yes  Equipment Recommendations  None recommended by PT    Recommendations for Other Services       Precautions / Restrictions Precautions Precautions: Fall;Other  (comment) Recall of Precautions/Restrictions: Impaired Precaution/Restrictions Comments: 1:1 sup Restrictions Weight Bearing Restrictions Per Provider Order: No     Mobility  Bed Mobility               General bed mobility comments: pt seated in standard chair in room when PT arrived and pt elected to get up from chair IND and look out window and move throughout room    Transfers Overall transfer level: Independent Equipment used: None Transfers: Sit to/from Stand                  Ambulation/Gait Ambulation/Gait assistance: Chief Operating Officer (Feet): 500 Feet Assistive device: None Gait Pattern/deviations: Step-through pattern Gait velocity: wfl     General Gait Details: pt requires min cues for safety and direction, pt demonstrates good reciprocal pattern, LEs passing in stance phase, appropriate cadence and no apparent LOB   Stairs             Wheelchair Mobility     Tilt Bed    Modified Rankin (Stroke Patients Only)       Balance Overall balance assessment: No apparent balance deficits (not formally assessed)                                          Communication Communication Communication: No apparent difficulties Factors Affecting Communication: Difficulty expressing self  Cognition Arousal: Alert Behavior During Therapy: WFL for tasks assessed/performed   PT - Cognitive impairments: History of cognitive impairments  PT - Cognition Comments:  (Concerns for safe return home due to significant cognitive impairments and poor safety awareness will need 24/7) Following commands: Impaired Following commands impaired: Follows multi-step commands inconsistently    Cueing Cueing Techniques: Gestural cues, Verbal cues, Tactile cues, Visual cues  Exercises      General Comments        Pertinent Vitals/Pain Pain Assessment Pain Assessment: No/denies pain Breathing:  normal Negative Vocalization: none Facial Expression: smiling or inexpressive Body Language: relaxed Consolability: no need to console PAINAD Score: 0    Home Living                          Prior Function            PT Goals (current goals can now be found in the care plan section) Acute Rehab PT Goals Patient Stated Goal: not able PT Goal Formulation: With patient/family Time For Goal Achievement: 01/03/24 Potential to Achieve Goals: Good Progress towards PT goals: Progressing toward goals    Frequency    Min 2X/week      PT Plan      Co-evaluation              AM-PAC PT 6 Clicks Mobility   Outcome Measure  Help needed turning from your back to your side while in a flat bed without using bedrails?: None Help needed moving from lying on your back to sitting on the side of a flat bed without using bedrails?: None Help needed moving to and from a bed to a chair (including a wheelchair)?: None Help needed standing up from a chair using your arms (e.g., wheelchair or bedside chair)?: None Help needed to walk in hospital room?: None Help needed climbing 3-5 steps with a railing? : A Little 6 Click Score: 23    End of Session   Activity Tolerance: Patient tolerated treatment well Patient left: with nursing/sitter in room;with family/visitor present;Other (comment) (pt standing in hallway with friend and sister) Nurse Communication: Mobility status PT Visit Diagnosis: Unsteadiness on feet (R26.81);Difficulty in walking, not elsewhere classified (R26.2)     Time: 8444-8396 PT Time Calculation (min) (ACUTE ONLY): 8 min  Charges:    $Gait Training: 8-22 mins PT General Charges $$ ACUTE PT VISIT: 1 Visit                     [place], PT Acute Rehab    [place] VEAR Drone 12/24/2023, 4:15 PM

## 2023-12-24 NOTE — Plan of Care (Signed)
   Problem: Clinical Measurements: Goal: Ability to maintain clinical measurements within normal limits will improve Outcome: Progressing Goal: Diagnostic test results will improve Outcome: Progressing   Problem: Coping: Goal: Level of anxiety will decrease Outcome: Progressing   Problem: Safety: Goal: Ability to remain free from injury will improve Outcome: Progressing

## 2023-12-24 NOTE — TOC Progression Note (Addendum)
 Transition of Care [facility]) - Progression Note    Patient Details  Name: Caitlyn [name] MRN: [id number] Date of Birth: 1946/01/13  [facility] CM/SW Contact  Alfonse JONELLE Rex, RN Phone Number: 12/24/2023, 12:46 PM  Clinical Narrative:   Call to patient's sister, [name], to review SNF bed offers and inquire if has accepted a bed offer. [name] states she would like patient to have a Psych evaluation to rule out Bipolar Disorder prior to selection of SNF bed offer to make sure patient is going to best facility needed for her care.. Teams chat sent to attending to update on sisters request.   -3:18pm Psych consult pending.    Expected Discharge Plan: Skilled Nursing Facility Barriers to Discharge: Continued Medical Work up               Expected Discharge Plan and Services In-house Referral: Clinical Social Work Discharge Planning Services: NA Post Acute Care Choice: Skilled Nursing Facility Living arrangements for the past 2 months: Skilled Nursing Facility                 DME Arranged: N/A DME Agency: NA       HH Arranged: NA HH Agency: NA         Social Drivers of Health (SDOH) Interventions SDOH Screenings   Food Insecurity: No Food Insecurity (12/20/2023)  Housing: Low Risk  (12/20/2023)  Transportation Needs: No Transportation Needs (12/20/2023)  Utilities: Not At Risk (12/20/2023)  Social Connections: Moderately Integrated (12/20/2023)  Tobacco Use: Low Risk  (12/19/2023)    Readmission Risk Interventions    12/20/2023    4:07 PM  Readmission Risk Prevention Plan  Post Dischage Appt Complete  Medication Screening Complete  Transportation Screening Complete

## 2023-12-24 NOTE — Progress Notes (Addendum)
 PROGRESS NOTE  Caitlyn FERGESON  FMW:[id number] DOB: 1945/11/24 DOA: 12/19/2023 PCP: [name] LABOR, MD   Brief Narrative: Patient is a 78 year old female with history of Alzheimer's dementia, coronary artery disease status post PCI, hypothyroidism, hypertension, hyperlipidemia, GERD, IDA, arthritis who presented for the evaluation of altered mental status.  EMS was called by patient's neighbor after she was found standing at her neighbor's door undressed from waist down asking for underwear. Per Triage note, she was more confused than usual.  On presentation, she was pleasantly confused.  She was admitted for further evaluation of altered mental status, agitation. UA was suspicious for UTI.   She repeatedly asked leave from the ED but due to unsafe disposition, psychiatry consulted, IVCed.  TOC following and assisting with safe disposition.  Patient is not stable to be without supervision due to her dementia.   Subjective: No complaints today. States she misses her dog and is going home tomorrow.   Assessment & Plan:   Acute metabolic encephalopathy/delirium/history of Alzheimer's dementia: Presented with worsening confusion from baseline per neighbors.  Could have been aggravated by UTI.  Patient lives alone with her dog. There is concern about patient's ability to take care of herself due to recent increased confusion from baseline, misplacing belongings, and neglecting finances.    - Evaluated by psychiatry-no need for inpatient psychiatric admission or initiation of any antipsychotic medication.  - Psychiatry specifically recommends to avoid benzodiazepines and anticholinergics-reversible tests - Started on Seroquel  12.5 mg at bedtime which she appears to be tolerating well- is sleeping at night -Unfortunately, mental status has not improved with treating her UTI - TOC assisting with disposition  Suspected UTI: UA was suspicious for UTI. Started on cephalexin  on 12/19/2023-urine  culture growing E. coli which is pansensitive - needs Cephalexin  for 1 more day  Hypertension: BP stable.  Continue Coreg  and amlodipine     History of coronary artery disease: Status post PCI.  No anginal symptoms.  Currently on aspirin   Hypokalemia: replaced  POA: POA is her daughter whom the patient is estranged from>  [name]- [phone]       DVT prophylaxis:enoxaparin  (LOVENOX ) injection 40 mg Start: 12/20/23 0800     Code Status: Full Code   Consultants: Psychiatry   Antimicrobials:  Anti-infectives (From admission, onward)    Start     Dose/Rate Route Frequency Ordered Stop   12/19/23 2300  cephALEXin  (KEFLEX ) capsule 250 mg        250 mg Oral Every 8 hours 12/19/23 2248 12/24/23 1501        Objective: Vitals:   12/23/23 1951 12/24/23 0652 12/24/23 1245 12/24/23 1638  BP: (!) 150/78 (!) (P) 143/73 127/61 (!) 140/85  Pulse: 87 (P) 74 86 83  Resp: 18 (P) 16 18 18   Temp: 97.9 F (36.6 C) (P) 98 F (36.7 C) 98.4 F (36.9 C) 98.4 F (36.9 C)  TempSrc: Oral (P) Oral    SpO2: 97%  97% 98%  Weight:      Height:        Intake/Output Summary (Last 24 hours) at 12/24/2023 1839 Last data filed at 12/24/2023 1812 Gross per 24 hour  Intake 480 ml  Output --  Net 480 ml   Filed Weights   12/19/23 1703  Weight: 58.5 kg    Examination:  General exam: Overall comfortable, not in distress, poor short term memory and erratic moods HEENT: PERRL Respiratory system:  no wheezes or crackles  Cardiovascular system: S1 & S2 heard, RRR.  Gastrointestinal system: Abdomen is nondistended, soft and nontender. Central nervous system: Alert and oriented to person and place Extremities: No edema, no clubbing ,no cyanosis Skin: No rashes, no ulcers,no icterus     Data Reviewed: I have personally reviewed following labs and imaging studies  CBC: Recent Labs  Lab 12/19/23 1828 12/20/23 0515  WBC 7.2 5.9  NEUTROABS 4.8  --   HGB 14.3 12.7  HCT 43.4  39.0  MCV 88.4 88.4  PLT 248 210   Basic Metabolic Panel: Recent Labs  Lab 12/19/23 1828 12/20/23 0515 12/21/23 0513 12/22/23 0524  NA 140 137 140 138  K 3.6 3.4* 3.3* 3.6  CL 105 105 109 109  CO2 27 24 23  21*  GLUCOSE 124* 106* 103* 113*  BUN 21 20 20 22   CREATININE 0.63 0.61 0.55 0.56  CALCIUM  10.1 9.0 9.1 8.9        Radiology Studies: No results found.   Scheduled Meds:  amLODipine   5 mg Oral Daily   aspirin  EC  81 mg Oral Daily   carvedilol   6.25 mg Oral BID WC   enoxaparin  (LOVENOX ) injection  40 mg Subcutaneous Q24H   QUEtiapine   12.5 mg Oral QHS       LOS: 5 days   Esperansa Sarabia, MD [facility] P8/15/2025, 6:39 PM

## 2023-12-25 DIAGNOSIS — N390 Urinary tract infection, site not specified: Secondary | ICD-10-CM

## 2023-12-25 DIAGNOSIS — F03918 Unspecified dementia, unspecified severity, with other behavioral disturbance: Secondary | ICD-10-CM | POA: Diagnosis not present

## 2023-12-25 MED ORDER — CEPHALEXIN 250 MG PO CAPS
250.0000 mg | ORAL_CAPSULE | Freq: Three times a day (TID) | ORAL | Status: AC
  Administered 2023-12-25 – 2023-12-26 (×4): 250 mg via ORAL
  Filled 2023-12-25 (×4): qty 1

## 2023-12-25 NOTE — Progress Notes (Signed)
 Mobility Specialist - Progress Note   12/25/23 1058  Mobility  Activity Ambulated independently  Level of Assistance Independent after set-up  Assistive Device None  Distance Ambulated (ft) 500 ft  Range of Motion/Exercises Active  Activity Response Tolerated well  Mobility Referral Yes  Mobility visit 1 Mobility  Mobility Specialist Start Time (ACUTE ONLY) 0950  Mobility Specialist Stop Time (ACUTE ONLY) 1015  Mobility Specialist Time Calculation (min) (ACUTE ONLY) 25 min   Received in bed and agreed to mobility. No issues throughout session, returned to bed with all needs met.  Cyndee Ada Mobility Specialist

## 2023-12-25 NOTE — Plan of Care (Signed)
   Problem: Coping: Goal: Level of anxiety will decrease Outcome: Progressing   Problem: Safety: Goal: Ability to remain free from injury will improve Outcome: Progressing   Problem: Skin Integrity: Goal: Risk for impaired skin integrity will decrease Outcome: Progressing

## 2023-12-25 NOTE — Plan of Care (Signed)
  Problem: Clinical Measurements: Goal: Ability to maintain clinical measurements within normal limits will improve Outcome: Progressing Goal: Will remain free from infection Outcome: Progressing Goal: Diagnostic test results will improve Outcome: Progressing Goal: Respiratory complications will improve Outcome: Progressing Goal: Cardiovascular complication will be avoided Outcome: Progressing   Problem: Coping: Goal: Level of anxiety will decrease Outcome: Progressing   Problem: Elimination: Goal: Will not experience complications related to bowel motility Outcome: Progressing Goal: Will not experience complications related to urinary retention Outcome: Progressing   Problem: Pain Managment: Goal: General experience of comfort will improve and/or be controlled Outcome: Progressing   Problem: Safety: Goal: Ability to remain free from injury will improve Outcome: Progressing   Problem: Skin Integrity: Goal: Risk for impaired skin integrity will decrease Outcome: Progressing   Problem: Education: Goal: Knowledge of General Education information will improve Description: Including pain rating scale, medication(s)/side effects and non-pharmacologic comfort measures Outcome: Not Progressing   Problem: Health Behavior/Discharge Planning: Goal: Ability to manage health-related needs will improve Outcome: Not Progressing

## 2023-12-25 NOTE — Consult Note (Signed)
 [facility] Psychiatric Consult Initial  Patient Name: .Caitlyn [name]  MRN: [id number]  DOB: 02-Nov-1945  Consult Order details:  Orders (From admission, onward)     Start     Ordered   12/24/23 1356  IP CONSULT TO PSYCHIATRY       Comments: Her sister also requests that she be assessed for bipolar disorder. Thank you.  Ordering Provider: Rizwan, Saima, MD  Provider:  (Not yet assigned)  Question Answer Comment  Location [facility]   Reason for Consult? IVC      12/24/23 1356   12/19/23 1922  CONSULT TO CALL ACT TEAM       Ordering Provider: Randol Simmonds, MD  Provider:  (Not yet assigned)  Question:  Reason for Consult?  Answer:  dementia with behavioral disturbace, confusion   12/19/23 1921             Mode of Visit: In person    Psychiatry Consult Evaluation  Service Date: December 25, 2023 LOS:  LOS: 6 days  Chief Complaint I'm doing great.  Primary Psychiatric Diagnoses  Dementia with behavioral disturbance UTI  Assessment  Caitlyn [name] is a 78 y.o. female admitted: Medicallyfor 12/19/2023  4:44 PM for Alzheimer's dementia, CAD/NSTEMI s/p PCI, hypothyroidism, HTN, HLD, GERD, IDA and arthritis who was brought in by EMS for evaluation of altered mental status. No psychiatric history.  Her current presentation of waxing and waning of memory, wandering behaviors at times, misplacing things, and sundowning behaviors is most consistent with dementia behaviors and/or UTI. She has no psychiatric history or medications for mental health needs.  On initial examination, patient was pleasant sitting in the bed with her personal pajamas and house coat, meticulous make-up and lipstick, jewelry.  She engaged easily in conversation  Please see plan below for detailed recommendations.   Diagnoses:  Active Hospital problems: Principal Problem:   Acute metabolic encephalopathy Active Problems:   Dementia with behavioral disturbance (HCC)   Major neurocognitive  disorder (HCC)   Acute cystitis without hematuria   Delirium    Plan   ## Psychiatric Medication Recommendations:  Continue Seroquel  12.5 mg at bedtime for sleep  ## Medical Decision Making Capacity: Not specifically addressed in this encounter  ## Further Work-up:  -- most recent EKG on 12/20/2023 had QtC of 441 -- Pertinent labwork reviewed earlier this admission includes: CBC, chem panel, U/A,    ## Disposition:-- There are no psychiatric contraindications to discharge at this time  ## Behavioral / Environmental: - No specific recommendations at this time.     ## Safety and Observation Level:  - Based on my clinical evaluation, I estimate the patient to be at minimal risk of self harm in the current setting. - At this time, we recommend  routine. This decision is based on my review of the chart including patient's history and current presentation, interview of the patient, mental status examination, and consideration of suicide risk including evaluating suicidal ideation, plan, intent, suicidal or self-harm behaviors, risk factors, and protective factors. This judgment is based on our ability to directly address suicide risk, implement suicide prevention strategies, and develop a safety plan while the patient is in the clinical setting. Please contact our team if there is a concern that risk level has changed.  CSSR Risk Category:C-SSRS RISK CATEGORY: No Risk  Suicide Risk Assessment: Patient has following modifiable risk factors for suicide: none Patient has following non-modifiable or demographic risk factors for suicide: none Patient has the  following protective factors against suicide: Supportive family, Supportive friends, Pets in the home, no history of suicide attempts, and no history of NSSIB  Thank you for this consult request. Recommendations have been communicated to the primary team.  We will sign off at this time.   Sharlot Becker, NP       History of Present  Illness  Relevant Aspects of [facility] Course:  Admitted on 12/19/2023 for Alzheimer's dementia, CAD/NSTEMI s/p PCI, hypothyroidism, HTN, HLD, GERD, IDA and arthritis who was brought in by EMS for evaluation of altered mental status. No psychiatric history.  Patient Report:  78 yo female presented with AMS and diagnosed with a UTI.  Her neighbors told her sister her wandering and confusing symptoms have been occurring for awhile.  Per sister, at least four years.  On assessment, the client was sitting upright and immaculately dressed including make-up and lipstick.  She reported she was doing great, walking every day.  Her sleep was great, appetite is pretty good with her almost empty breakfast tray.  Denied depression, I don't typically get depressed.  Denies suicidal ideations.  No anxiety/worry, I have my little dog, Dorian Dutton, that is a joy.  Denies mania symptoms, past and present, I like my sleep.  No hallucinations, paranoia, homicidal ideations, or substance use.  No psychiatric history, diagnoses, or medications.  Psych ROS:  Depression: denied Anxiety:  denied Mania (lifetime and current): none Psychosis: (lifetime and current): none  Collateral information:  Contacted sister on 12/25/2023 who described behaviors consistent with dementia for at least 4 years.  Concerned about her wandering, confusion, and ability to care for herself.  She contacted the patient's POA, her daughter, who granted her permission to seek a nursing facility for the client who is going this week to explore facilities.  Review of Systems  Psychiatric/Behavioral:  Positive for memory loss.   All other systems reviewed and are negative.    Psychiatric and Social History  Psychiatric History:  Information collected from patient, chart, and sister.  Prev Dx/Sx: none Current Psych Provider: none Home Meds (current): none Previous Med Trials: none Therapy: none  Prior Psych Hospitalization:  none  Prior Self Harm: none Prior Violence: none  Social History:  Occupational Hx: retired Armed forces operational officer Hx: none Living Situation: lives alone  Access to weapons/lethal means: none   Substance History No substance abuse, past or present  Exam Findings  Physical Exam: completed by the MD, reviewed by this provider. Vital Signs:  Temp:  [97.6 F (36.4 C)-98.5 F (36.9 C)] 97.6 F (36.4 C) (08/16 0742) Pulse Rate:  [72-88] 73 (08/16 0742) Resp:  [16-18] 18 (08/16 0742) BP: (127-154)/(61-85) 140/72 (08/16 0742) SpO2:  [96 %-99 %] 98 % (08/16 0742) Blood pressure (!) 140/72, pulse 73, temperature 97.6 F (36.4 C), temperature source Oral, resp. rate 18, height 5' 4 (1.626 m), weight 58.5 kg, SpO2 98%. Body mass index is 22.14 kg/m.  Physical Exam  Mental Status Exam: General Appearance: Casual  Orientation:  Full (Time, Place, and Person)  Memory:  Immediate;   Good Recent;   Fair Remote;   Fair  Concentration:  Concentration: Good and Attention Span: Good  Recall:  Good  Attention  Good  Eye Contact:  Good  Speech:  Clear and Coherent  Language:  Good  Volume:  Normal  Mood: denied depression and anxiety  Affect:  Appropriate  Thought Process:  Coherent  Thought Content:  Logical  Suicidal Thoughts:  No  Homicidal Thoughts:  No  Judgement:  Fair  Insight:  Lacking  Psychomotor Activity:  Normal  Akathisia:  No  Fund of Knowledge:  Good      Assets:  Housing Leisure Time Physical Health Resilience Social Support  Cognition:  Impaired,  Mild  ADL's:  Intact  AIMS (if indicated):        Other History   These have been pulled in through the EMR, reviewed, and updated if appropriate.  Family History:  The patient's family history includes Alzheimer's disease in her mother; Colon cancer in her maternal grandfather; Heart attack in her father; Heart disease in her brother and mother.  Medical History: Past Medical History:  Diagnosis Date   Anemia     Arthritis    Bleeding diathesis (HCC) 02/22/2013   Closed right hip fracture (HCC) 05/28/2016   Coronary artery disease 06/07/2016   NSTEMI 05/2016 s/p 2.25 x 20 mm DES to mid LAD // NSTEMI 08/2017 s/p 2.5 x 8 mm DES to mid LAD, 3 x 12 mm DES to proximal LAD  LHC 11/29/2017: Prox and mid LAD stents patent, jailed D1 proximal 50, mid 60-80 (unchanged) // Nuc stress 12/2017: low risk   E coli enteritis    GERD (gastroesophageal reflux disease)    H/O hiatal hernia    Head injury    Hypertension    Iron  deficiency anemia, unspecified 02/22/2013   Mass of right kidney 08/22/2013   2.5 cm solid lesion seen on 5/14 CT   Myocardial infarction [facility])    Radicular pain of thoracic region 08/22/2013   Left T-8 started after pushing heavy chest 1/15; intermittent; no focal neuro deficit    Surgical History: Past Surgical History:  Procedure Laterality Date   ABDOMINAL HYSTERECTOMY     BREAST BIOPSY     BREAST LUMPECTOMY Left    BREAST REDUCTION SURGERY  1981   BREAST SURGERY  1981   rt-nodes taken out-non cancer   BREAST SURGERY  1981   rt lumpectomy   CARDIAC CATHETERIZATION N/A 06/01/2016   Procedure: LEFT HEART CATH;  Surgeon: Dorn JINNY Lesches, MD;  Location: [facility] IN[facility];  Service: Cardiovascular;  Laterality: N/A;   CARDIAC CATHETERIZATION N/A 06/01/2016   Procedure: Coronary Stent Intervention;  Surgeon: Dorn JINNY Lesches, MD;  Location: [facility] CV [facility];  Service: Cardiovascular;  Laterality: N/A;   COLONOSCOPY     CORONARY STENT INTERVENTION N/A 08/14/2017   Procedure: CORONARY STENT INTERVENTION;  Surgeon: Verlin Lonni BIRCH, MD;  Location: [facility];  Service: Cardiovascular;  Laterality: N/A;   FACIAL FRACTURE SURGERY  2000   FEMUR IM NAIL Right 06/03/2016   Procedure: INTRAMEDULLARY (IM) NAIL FEMORAL;  Surgeon: Redell Shoals, MD;  Location: [facility] OR;  Service: Orthopedics;  Laterality: Right;   LEFT HEART CATH AND CORONARY ANGIOGRAPHY N/A 08/14/2017   Procedure:  LEFT HEART CATH AND CORONARY ANGIOGRAPHY;  Surgeon: Verlin Lonni BIRCH, MD;  Location: [facility];  Service: Cardiovascular;  Laterality: N/A;   LEFT HEART CATH AND CORONARY ANGIOGRAPHY N/A 11/29/2017   Procedure: LEFT HEART CATH AND CORONARY ANGIOGRAPHY;  Surgeon: Claudene Victory ORN, MD;  Location: [facility];  Service: Cardiovascular;  Laterality: N/A;   REDUCTION MAMMAPLASTY Bilateral    years ago   TONSILLECTOMY     TRIGGER FINGER RELEASE Right 11/28/2013   Procedure: RELEASE TRIGGER FINGER/A-1 PULLEY RIGHT LONG FINGER;  Surgeon: Lamar Leonor Mickey LULLA, MD;  Location: [facility];  Service: Orthopedics;  Laterality: Right;  URETHRA SURGERY     age 56     Medications:   Current Facility-Administered Medications:    acetaminophen  (TYLENOL ) tablet 650 mg, 650 mg, Oral, Q6H PRN, 650 mg at 12/20/23 2141 **OR** acetaminophen  (TYLENOL ) suppository 650 mg, 650 mg, Rectal, Q6H PRN, Lou, Claretta HERO, MD   alum & mag hydroxide-simeth (MAALOX/MYLANTA) 200-200-20 MG/5ML suspension 30 mL, 30 mL, Oral, Q4H PRN, Rizwan, Saima, MD, 30 mL at 12/24/23 9166   amLODipine  (NORVASC ) tablet 5 mg, 5 mg, Oral, Daily, Amponsah, Prosper M, MD, 5 mg at 12/25/23 9187   aspirin  EC tablet 81 mg, 81 mg, Oral, Daily, Lou Claretta HERO, MD, 81 mg at 12/25/23 9187   bisacodyl  (DULCOLAX) EC tablet 5 mg, 5 mg, Oral, Daily PRN, Lou Claretta HERO, MD   carvedilol  (COREG ) tablet 6.25 mg, 6.25 mg, Oral, BID WC, Amponsah, Prosper M, MD, 6.25 mg at 12/25/23 9188   enoxaparin  (LOVENOX ) injection 40 mg, 40 mg, Subcutaneous, Q24H, Lou Claretta HERO, MD, 40 mg at 12/25/23 9188   haloperidol  lactate (HALDOL ) injection 2 mg, 2 mg, Intramuscular, Q6H PRN, Rizwan, Saima, MD   haloperidol  lactate (HALDOL ) injection 2.5 mg, 2.5 mg, Intravenous, Q6H PRN, Adhikari, Amrit, MD   Muscle Rub CREA 1 Application, 1 Application, Topical, PRN, Adhikari, Amrit, MD   ondansetron  (ZOFRAN ) tablet 4 mg, 4 mg, Oral,  Q6H PRN **OR** ondansetron  (ZOFRAN ) injection 4 mg, 4 mg, Intravenous, Q6H PRN, Lou Claretta HERO, MD   Oral care mouth rinse, 15 mL, Mouth Rinse, PRN, Adhikari, Amrit, MD   QUEtiapine  (SEROQUEL ) tablet 12.5 mg, 12.5 mg, Oral, QHS, Adhikari, Amrit, MD, 12.5 mg at 12/24/23 2117   senna-docusate (Senokot-S) tablet 1 tablet, 1 tablet, Oral, QHS PRN, Lou Claretta HERO, MD  Allergies: Allergies  Allergen Reactions   Cortone Acetate [Cortisone] Anaphylaxis   Crestor  [Rosuvastatin  Calcium ] Other (See Comments)    Immediate memory loss lasting 3 months   Demerol [Meperidine] Anaphylaxis and Nausea And Vomiting   Monosodium Glutamate Anaphylaxis   Morphine And Codeine Anaphylaxis and Nausea And Vomiting   Prednisone Anaphylaxis and Other (See Comments)    Confusion    Shellfish Allergy Anaphylaxis   Sulfa Antibiotics Anaphylaxis   Hctz [Hydrochlorothiazide ] Other (See Comments)    Dizziness    Readi-Cat [Barium Sulfate] Diarrhea   Xanax [Alprazolam] Other (See Comments)    Altered mental state   Ceftin [Cefuroxime] Other (See Comments)    Dizziness    Flagyl [Metronidazole] Other (See Comments)    Acute depression   Macrodantin [Nitrofurantoin] Nausea Only and Other (See Comments)    GI upset   Praluent  [Alirocumab ] Diarrhea   Repatha  [Evolocumab ] Diarrhea   Wellbutrin [Bupropion] Other (See Comments)    Agitation    Benzoic Acid Nausea Only   Butrans [Buprenorphine] Nausea And Vomiting, Palpitations, Other (See Comments) and Hypertension    Tremors    Cipro [Ciprofloxacin Hcl] Hives and Rash   Influenza Vaccines Hives and Rash    Sharlot Becker, NP

## 2023-12-26 DIAGNOSIS — G9341 Metabolic encephalopathy: Secondary | ICD-10-CM | POA: Diagnosis not present

## 2023-12-26 MED ORDER — HALOPERIDOL LACTATE 5 MG/ML IJ SOLN: 2.0000 mg | Freq: Once | INTRAMUSCULAR | Status: DC | PRN

## 2023-12-26 NOTE — TOC Progression Note (Addendum)
 Transition of Care [facility]) - Progression Note    Patient Details  Name: Caitlyn [name] MRN: [id number] Date of Birth: 1946-03-03  Transition of [facility]) CM/SW Contact  Sonda Manuella Quill, RN Phone Number: 12/26/2023, 11:00 AM  Clinical Narrative:    Pt under IVC; expires today at 2101; Dr Earley and Sharlot Becker. Psych NP notified via secure; Sharlot said she will complete continuation of IVC; awaiting paperwork.  -1526- Patient remains under IVC; filed via e-Courts; received Envelope # [id number]; spoke w/ Garnette Radar; she confirmed receipt of documents; awaiting return documents.  -1746- File # [id number]; pt served 12/24/23 at 1626; IVC will expire 01/02/24 at 1626; documents scanned into EPIC; original documents (Affidavit and Petition for IVC, First Examination for IVC, and Return Custody) and 3 copies placed in orange folder on shadow chart on unit.   Expected Discharge Plan: Skilled Nursing Facility Barriers to Discharge: Continued Medical Work up               Expected Discharge Plan and Services In-house Referral: Clinical Social Work Discharge Planning Services: NA Post Acute Care Choice: Skilled Nursing Facility Living arrangements for the past 2 months: Skilled Nursing Facility                 DME Arranged: N/A DME Agency: NA       HH Arranged: NA HH Agency: NA         Social Drivers of Health (SDOH) Interventions SDOH Screenings   Food Insecurity: No Food Insecurity (12/20/2023)  Housing: Low Risk  (12/20/2023)  Transportation Needs: No Transportation Needs (12/20/2023)  Utilities: Not At Risk (12/20/2023)  Social Connections: Moderately Integrated (12/20/2023)  Tobacco Use: Low Risk  (12/19/2023)    Readmission Risk Interventions    12/20/2023    4:07 PM  Readmission Risk Prevention Plan  Post Dischage Appt Complete  Medication Screening Complete  Transportation Screening Complete

## 2023-12-26 NOTE — Progress Notes (Signed)
 PROGRESS NOTE  Caitlyn [name]  FMW:[id number] DOB: [birth year] DOA: 12/19/2023 PCP: [name] LABOR, MD   Brief Narrative: Patient is a 78 year old female with history of Alzheimer's dementia, coronary artery disease status post PCI, hypothyroidism, hypertension, hyperlipidemia, GERD, IDA, arthritis who presented for the evaluation of altered mental status.  EMS was called by patient's neighbor after she was found standing at her neighbor's door undressed from waist down asking for underwear. Per Triage note, she was more confused than usual.  On presentation, she was pleasantly confused.  She was admitted for further evaluation of altered mental status, agitation. UA was suspicious for UTI.   She repeatedly asked leave from the ED but due to unsafe disposition, psychiatry consulted, IVCed.  TOC following and assisting with safe disposition.  Patient is not stable to be without supervision due to her dementia.   Subjective: She has no complaints.   Assessment & Plan:   Acute metabolic encephalopathy/delirium/history of Alzheimer's dementia: Presented with worsening confusion from baseline per neighbors.  Could have been aggravated by UTI.  Patient lives alone with her dog. There is concern about patient's ability to take care of herself due to recent increased confusion from baseline, misplacing belongings, and neglecting finances.    - Evaluated by psychiatry-no need for inpatient psychiatric admission or initiation of any antipsychotic medication.  - Psychiatry specifically recommends to avoid benzodiazepines and anticholinergics-reversible tests - Started on Seroquel  12.5 mg at bedtime which she appears to be tolerating well- is sleeping at night -Unfortunately, mental status has not improved with treating her UTI - TOC assisting with disposition  Suspected UTI: UA was suspicious for UTI. Started on cephalexin  on 12/19/2023-urine culture growing E. coli which is pansensitive - will  complete 7 day course of Cephalexin  today  Hypertension: BP stable.  Continue Coreg  and amlodipine     History of coronary artery disease: Status post PCI.  No anginal symptoms.  Currently on aspirin   Hypokalemia: replaced  POA: POA is her daughter whom the patient is estranged from>  Kimberly Zesiger- [phone]  She has been ambulating in the hall multiple times a day. Will dc Lovenox .       DVT prophylaxis:enoxaparin  (LOVENOX ) injection 40 mg Start: 12/20/23 0800     Code Status: Full Code   Consultants: Psychiatry   Antimicrobials:  Anti-infectives (From admission, onward)    Start     Dose/Rate Route Frequency Ordered Stop   12/25/23 2200  cephALEXin  (KEFLEX ) capsule 250 mg        250 mg Oral Every 8 hours 12/25/23 1901 12/27/23 0559   12/19/23 2300  cephALEXin  (KEFLEX ) capsule 250 mg        250 mg Oral Every 8 hours 12/19/23 2248 12/24/23 1501        Objective: Vitals:   12/25/23 2056 12/26/23 0615 12/26/23 1057 12/26/23 1101  BP: (!) 143/81 135/65 139/85 139/85  Pulse: 87 87  79  Resp: 16 16  16   Temp: 98.2 F (36.8 C) 98 F (36.7 C)  98.2 F (36.8 C)  TempSrc: Oral Oral  Oral  SpO2: 99% 99%  100%  Weight:      Height:        Intake/Output Summary (Last 24 hours) at 12/26/2023 1529 Last data filed at 12/26/2023 0500 Gross per 24 hour  Intake 120 ml  Output --  Net 120 ml   Filed Weights   12/19/23 1703  Weight: 58.5 kg    Examination:  General exam: Overall comfortable, not in  distress, poor short term memory and erratic moods HEENT: PERRL Respiratory system:  no wheezes or crackles  Cardiovascular system: S1 & S2 heard, RRR.  Gastrointestinal system: Abdomen is nondistended, soft and nontender. Central nervous system: Alert and oriented to person and place Extremities: No edema, no clubbing ,no cyanosis Skin: No rashes, no ulcers,no icterus     Data Reviewed: I have personally reviewed following labs and imaging  studies  CBC: Recent Labs  Lab 12/19/23 1828 12/20/23 0515  WBC 7.2 5.9  NEUTROABS 4.8  --   HGB 14.3 12.7  HCT 43.4 39.0  MCV 88.4 88.4  PLT 248 210   Basic Metabolic Panel: Recent Labs  Lab 12/19/23 1828 12/20/23 0515 12/21/23 0513 12/22/23 0524  NA 140 137 140 138  K 3.6 3.4* 3.3* 3.6  CL 105 105 109 109  CO2 27 24 23  21*  GLUCOSE 124* 106* 103* 113*  BUN 21 20 20 22   CREATININE 0.63 0.61 0.55 0.56  CALCIUM  10.1 9.0 9.1 8.9        Radiology Studies: No results found.   Scheduled Meds:  amLODipine   5 mg Oral Daily   aspirin  EC  81 mg Oral Daily   carvedilol   6.25 mg Oral BID WC   cephALEXin   250 mg Oral Q8H   enoxaparin  (LOVENOX ) injection  40 mg Subcutaneous Q24H   QUEtiapine   12.5 mg Oral QHS       LOS: 7 days   True Atlas, MD [facility] P8/17/2025, 3:29 PM

## 2023-12-26 NOTE — Progress Notes (Signed)
 Mobility Specialist - Progress Note   12/26/23 0954  Mobility  Activity Ambulated with assistance  Level of Assistance Independent  Assistive Device None  Distance Ambulated (ft) 500 ft  Range of Motion/Exercises Active  Activity Response Tolerated well  Mobility Referral Yes  Mobility visit 1 Mobility  Mobility Specialist Start Time (ACUTE ONLY) 0909  Mobility Specialist Stop Time (ACUTE ONLY) 0917  Mobility Specialist Time Calculation (min) (ACUTE ONLY) 8 min   Received in chair and agreed to mobility. Had no issues throughout session and returned to chair with all needs met.  Cyndee Ada Mobility Specialist

## 2023-12-26 NOTE — Plan of Care (Signed)
   Problem: Education: Goal: Knowledge of General Education information will improve Description Including pain rating scale, medication(s)/side effects and non-pharmacologic comfort measures Outcome: Progressing   Problem: Clinical Measurements: Goal: Ability to maintain clinical measurements within normal limits will improve Outcome: Progressing   Problem: Clinical Measurements: Goal: Will remain free from infection Outcome: Progressing

## 2023-12-26 NOTE — Plan of Care (Signed)
  Problem: Clinical Measurements: Goal: Ability to maintain clinical measurements within normal limits will improve Outcome: Progressing Goal: Will remain free from infection Outcome: Progressing Goal: Diagnostic test results will improve Outcome: Progressing Goal: Respiratory complications will improve Outcome: Progressing Goal: Cardiovascular complication will be avoided Outcome: Progressing   Problem: Coping: Goal: Level of anxiety will decrease Outcome: Progressing   Problem: Elimination: Goal: Will not experience complications related to bowel motility Outcome: Progressing Goal: Will not experience complications related to urinary retention Outcome: Progressing   Problem: Pain Managment: Goal: General experience of comfort will improve and/or be controlled Outcome: Progressing   Problem: Safety: Goal: Ability to remain free from injury will improve Outcome: Progressing   Problem: Skin Integrity: Goal: Risk for impaired skin integrity will decrease Outcome: Progressing   Problem: Education: Goal: Knowledge of General Education information will improve Description: Including pain rating scale, medication(s)/side effects and non-pharmacologic comfort measures Outcome: Not Progressing   Problem: Health Behavior/Discharge Planning: Goal: Ability to manage health-related needs will improve Outcome: Not Progressing

## 2023-12-27 DIAGNOSIS — G9341 Metabolic encephalopathy: Secondary | ICD-10-CM | POA: Diagnosis not present

## 2023-12-27 NOTE — Plan of Care (Signed)

## 2023-12-27 NOTE — Progress Notes (Signed)
 Physical Therapy Note  Patient Details Name: Caitlyn [name] MRN: [id number] DOB: 1946/04/02   Pt amb in hallway with Safety Sitter NO AD and NO assist tolerating full unit.    Katheryn Leap  PTA Acute  Rehabilitation Services Office M-F          [phone]

## 2023-12-27 NOTE — Progress Notes (Signed)
 PROGRESS NOTE  Caitlyn [name]  FMW:[id number] DOB: [birth year] DOA: 12/19/2023 PCP: [name] LABOR, MD   Brief Narrative: Patient is a 78 year old female with history of Alzheimer's dementia, coronary artery disease status post PCI, hypothyroidism, hypertension, hyperlipidemia, GERD, IDA, arthritis who presented for the evaluation of altered mental status.  EMS was called by patient's neighbor after she was found standing at her neighbor's door undressed from waist down asking for underwear. Per Triage note, she was more confused than usual.  On presentation, she was pleasantly confused.  She was admitted for further evaluation of altered mental status, agitation. UA was suspicious for UTI.   She repeatedly asked leave from the ED but due to unsafe disposition, psychiatry consulted, IVCed.  TOC following and assisting with safe disposition.  Patient is not stable to be without supervision due to her dementia.   Subjective: She has no complaints.   Assessment & Plan:   Acute metabolic encephalopathy/delirium/history of Alzheimer's dementia: Presented with worsening confusion from baseline per neighbors.  Could have been aggravated by UTI.  Patient lives alone with her dog. There is concern about patient's ability to take care of herself due to recent increased confusion from baseline, misplacing belongings, and neglecting finances.    - Evaluated by psychiatry-no need for inpatient psychiatric admission or initiation of any antipsychotic medication.  - Psychiatry specifically recommends to avoid benzodiazepines and anticholinergics-reversible tests - Started on Seroquel  12.5 mg at bedtime which she appears to be tolerating well- is sleeping at night -Unfortunately, mental status has not improved with treating her UTI - TOC assisting with disposition  Suspected UTI: UA was suspicious for UTI. Started on cephalexin  on 12/19/2023-urine culture growing E. coli which is pansensitive - completed  7 day course of Cephalexin   Hypertension: BP stable.  Continue Coreg  and amlodipine     History of coronary artery disease: Status post PCI.  No anginal symptoms.  Currently on aspirin   Hypokalemia: replaced  POA: POA is her daughter whom the patient is estranged from>  Caitlyn [name]- [phone]  She has been ambulating in the hall multiple times a day- dc'd Lovenox .       DVT prophylaxis:     Code Status: Full Code   Consultants: Psychiatry   Antimicrobials:  Anti-infectives (From admission, onward)    Start     Dose/Rate Route Frequency Ordered Stop   12/25/23 2200  cephALEXin  (KEFLEX ) capsule 250 mg        250 mg Oral Every 8 hours 12/25/23 1901 12/26/23 1945   12/19/23 2300  cephALEXin  (KEFLEX ) capsule 250 mg        250 mg Oral Every 8 hours 12/19/23 2248 12/24/23 1501        Objective: Vitals:   12/26/23 1057 12/26/23 1101 12/26/23 2033 12/27/23 1257  BP: 139/85 139/85 (!) 163/91 127/62  Pulse:  79 87 79  Resp:  16 18 16   Temp:  98.2 F (36.8 C) 98.6 F (37 C) 98.9 F (37.2 C)  TempSrc:  Oral  Oral  SpO2:  100% 96% 99%  Weight:      Height:        Intake/Output Summary (Last 24 hours) at 12/27/2023 1911 Last data filed at 12/27/2023 1444 Gross per 24 hour  Intake 1080 ml  Output --  Net 1080 ml   Filed Weights   12/19/23 1703  Weight: 58.5 kg    Examination:  General exam: Overall comfortable, not in distress, poor short term memory and erratic moods HEENT: PERRL  Respiratory system:  no wheezes or crackles  Cardiovascular system: S1 & S2 heard, RRR.  Gastrointestinal system: Abdomen is nondistended, soft and nontender. Central nervous system: Alert and oriented to person and place Extremities: No edema, no clubbing ,no cyanosis Skin: No rashes, no ulcers,no icterus     Data Reviewed: I have personally reviewed following labs and imaging studies  CBC: No results for input(s): WBC, NEUTROABS, HGB, HCT, MCV, PLT in the  last 168 hours.  Basic Metabolic Panel: Recent Labs  Lab 12/21/23 0513 12/22/23 0524  NA 140 138  K 3.3* 3.6  CL 109 109  CO2 23 21*  GLUCOSE 103* 113*  BUN 20 22  CREATININE 0.55 0.56  CALCIUM  9.1 8.9        Radiology Studies: No results found.   Scheduled Meds:  amLODipine   5 mg Oral Daily   aspirin  EC  81 mg Oral Daily   carvedilol   6.25 mg Oral BID WC   QUEtiapine   12.5 mg Oral QHS       LOS: 8 days   Caitlyn Mani, MD [facility] P8/18/2025, 7:11 PM

## 2023-12-27 NOTE — Progress Notes (Signed)
 Occupational Therapy Treatment Patient Details Name: Caitlyn [name] MRN: [id number] DOB: November 01, 1945 Today's Date: 12/27/2023   History of present illness Pt is a 78 yo female presenting to [facility] on 12/19/23 after EMS was called by patient's neighbor after she was found standing, undressed from waist down asking for underwear.  Pt admitted for AMS and attempted to leave the ED, so psychiatry was consulted and pt ibnvoluntarily commited to hospital.  Pt with past hospitalization this year to [facility] on 05/21/23 for epigastric pain. PMH of MI, HTN, HLD, GERD, IDA, Alzheimer's dementia.   OT comments  Pt seen for OT 3rd visit since admission and determined to be at her functional baseline. Pt met all OT goals except those addressing cognitive function, as new information, per EMR, reported that neighbors endorsed over a year of seeing pt's needs increase due to dementia and related behaviors. Noted sister working on long term placement and no further OT needs identified.  OT to sign off.       If plan is discharge home, recommend the following:  Supervision due to cognitive status   Equipment Recommendations  None recommended by OT    Recommendations for Other Services      Precautions / Restrictions Precautions Precautions: Fall;Other (comment) Recall of Precautions/Restrictions: Impaired Precaution/Restrictions Comments: 1:1 sup Restrictions Weight Bearing Restrictions Per Provider Order: No       Mobility Bed Mobility   Bed Mobility: Sit to Supine     Supine to sit: Modified independent (Device/Increase time) Sit to supine: Modified independent (Device/Increase time)        Transfers                         Balance Overall balance assessment: No apparent balance deficits (not formally assessed)                                         ADL either performed or assessed with clinical judgement   ADL Overall ADL's : At baseline (Overall supervision due  to memory deficits.)     Grooming: Wash/dry hands;Supervision/safety;Standing Grooming Details (indicate cue type and reason): Pt did initiate hand hygiene at sink after using bathroom without need of cues to initiate.     Lower Body Bathing: Supervison/ safety   Upper Body Dressing : Supervision/safety   Lower Body Dressing: Supervision/safety;Sit to/from stand;Sitting/lateral leans   Toilet Transfer: Modified Independent;Supervision/safety;Ambulation;Comfort height toilet   Toileting- Clothing Manipulation and Hygiene: Modified independent;Sitting/lateral lean;Sit to/from stand Toileting - Clothing Manipulation Details (indicate cue type and reason): (Distant supervision)     Functional mobility during ADLs: Supervision/safety;Modified independent General ADL Comments: Mod I physically but supervision due to dementia.    Extremity/Trunk Assessment Upper Extremity Assessment Upper Extremity Assessment: Overall WFL for tasks assessed       Cervical / Trunk Assessment Cervical / Trunk Assessment: Normal    Vision Baseline Vision/History: 1 Wears glasses Ability to See in Adequate Light: 0 Adequate Patient Visual Report: No change from baseline     Perception     Praxis     Communication Communication Communication: No apparent difficulties Factors Affecting Communication: Difficulty expressing self   Cognition Arousal: Alert Behavior During Therapy: WFL for tasks assessed/performed Cognition: Cognition impaired, No family/caregiver present to determine baseline   Orientation impairments:  (Noted agitation when asked by OT last session. Pt with known dementia so  did not ask to avoid agitation.) Awareness: Intellectual awareness impaired, Online awareness impaired Memory impairment (select all impairments): Short-term memory, Working memory Attention impairment (select first level of impairment): Sustained attention Executive functioning impairment (select all  impairments): Reasoning, Problem solving                   Following commands: Intact Following commands impaired: Only follows one step commands consistently (Follows some multi-step commands but will need repetition at times)      Cueing   Cueing Techniques: Verbal cues, Visual cues  Exercises      Shoulder Instructions       General Comments      Pertinent Vitals/ Pain       Pain Assessment Pain Assessment: No/denies pain  Home Living                                          Prior Functioning/Environment              Frequency  Other (comment) (discharge OT)        Progress Toward Goals  OT Goals(current goals can now be found in the care plan section)  Progress towards OT goals: Goals met/education completed, patient discharged from OT  Acute Rehab OT Goals OT Goal Formulation: All assessment and education complete, DC therapy ADL Goals Additional ADL Goal #2: Noted pt agitated with cognitive questions/work. Goal discontinued to avoid pt agitation. Recommend 24/7 supervision due to dementia.  Plan      Co-evaluation                 AM-PAC OT 6 Clicks Daily Activity     Outcome Measure   Help from another person eating meals?: None Help from another person taking care of personal grooming?: None Help from another person toileting, which includes using toliet, bedpan, or urinal?: None Help from another person bathing (including washing, rinsing, drying)?: A Little Help from another person to put on and taking off regular upper body clothing?: A Little Help from another person to put on and taking off regular lower body clothing?: A Little 6 Click Score: 21    End of Session    OT Visit Diagnosis: Other symptoms and signs involving cognitive function;Cognitive communication deficit (R41.841)   Activity Tolerance Patient tolerated treatment well   Patient Left in bed;with nursing/sitter in room;with call  bell/phone within reach   Nurse Communication Other (comment) (Discharging OT. Pt assessed to be at her functional baseline this date)        Time: 1003-1020 OT Time Calculation (min): 17 min  Charges: OT General Charges $OT Visit: 1 Visit OT Treatments $Therapeutic Activity: 8-22 mins  Caitlyn, OT Acute Rehab Services Office: [phone] 12/27/2023   Caitlyn [name] 12/27/2023, 11:03 AM

## 2023-12-27 NOTE — TOC Progression Note (Addendum)
 Transition of Care [facility]) - Progression Note    Patient Details  Name: Caitlyn [name] MRN: [id number] Date of Birth: 05-26-1945  Transition of Care [facility]) CM/SW Contact  Heather DELENA Saltness, LCSW Phone Number: 12/27/2023, 11:01 AM  Clinical Narrative:    Pt re-evaluated by PT, continues to ambulate 500 ft independently, with no AD. Pt remains under IVC. Pt is not appropriate for SNF rehab. CSW spoke with [facility] Supervisor, Josefa Jes, who advised CSW to check with LTC/Memory Care facilities with locked unit for LOG, while pt's Medicaid is still pending. CSW sent message to [place] at [facility] and [place] at [facility]. Updated clinicals sent to [facility] and Harlene at [facility] via email. CSW attempted to speak with pt's sister, [name] [phone], via phone call to provide update; no answer, voicemail left requesting return phone call.  Expected Discharge Plan: Skilled Nursing Facility Barriers to Discharge: Continued Medical Work up  Expected Discharge Plan and Services In-house Referral: Clinical Social Work Discharge Planning Services: NA Post Acute Care Choice: Skilled Nursing Facility Living arrangements for the past 2 months: Skilled Nursing Facility                 DME Arranged: N/A DME Agency: NA       HH Arranged: NA HH Agency: NA         Social Drivers of Health (SDOH) Interventions SDOH Screenings   Food Insecurity: No Food Insecurity (12/20/2023)  Housing: Low Risk  (12/20/2023)  Transportation Needs: No Transportation Needs (12/20/2023)  Utilities: Not At Risk (12/20/2023)  Social Connections: Moderately Integrated (12/20/2023)  Tobacco Use: Low Risk  (12/19/2023)    Readmission Risk Interventions    12/20/2023    4:07 PM  Readmission Risk Prevention Plan  Post Dischage Appt Complete  Medication Screening Complete  Transportation Screening Complete    Signed: Heather Saltness, MSW, LCSW Clinical Social Worker Inpatient Care Management 12/27/2023 5:27 PM

## 2023-12-28 DIAGNOSIS — G9341 Metabolic encephalopathy: Secondary | ICD-10-CM | POA: Diagnosis not present

## 2023-12-28 NOTE — Plan of Care (Signed)
  Problem: Education: Goal: Knowledge of General Education information will improve Description: Including pain rating scale, medication(s)/side effects and non-pharmacologic comfort measures Outcome: Progressing   Problem: Health Behavior/Discharge Planning: Goal: Ability to manage health-related needs will improve Outcome: Progressing   Problem: Clinical Measurements: Goal: Respiratory complications will improve Outcome: Progressing Goal: Cardiovascular complication will be avoided Outcome: Progressing   Problem: Elimination: Goal: Will not experience complications related to urinary retention Outcome: Progressing   Problem: Pain Managment: Goal: General experience of comfort will improve and/or be controlled Outcome: Progressing   Problem: Skin Integrity: Goal: Risk for impaired skin integrity will decrease Outcome: Progressing

## 2023-12-28 NOTE — Progress Notes (Signed)
 PROGRESS NOTE  Caitlyn [name]  FMW:[id number] DOB: Apr 24, 1946 DOA: 12/19/2023 PCP: [name] LABOR, MD   Brief Narrative: Patient is a 78 year old female with history of Alzheimer's dementia, coronary artery disease status post PCI, hypothyroidism, hypertension, hyperlipidemia, GERD, IDA, arthritis who presented for the evaluation of altered mental status.  EMS was called by patient's neighbor after she was found standing at her neighbor's door undressed from waist down asking for underwear. Per Triage note, she was more confused than usual.  On presentation, she was pleasantly confused.  She was admitted for further evaluation of altered mental status, agitation. UA was suspicious for UTI.   She repeatedly asked leave from the ED but due to unsafe disposition, psychiatry consulted, IVCed.  TOC following and assisting with safe disposition.  Patient is not stable to be without supervision due to her dementia.   Subjective: She has no complaints.   Assessment & Plan:   Acute metabolic encephalopathy/delirium/history of Alzheimer's dementia: Presented with worsening confusion from baseline per neighbors.  Could have been aggravated by UTI.  Patient lives alone with her dog. There is concern about patient's ability to take care of herself due to recent increased confusion from baseline, misplacing belongings, and neglecting finances.    - Evaluated by psychiatry-no need for inpatient psychiatric admission or initiation of any antipsychotic medication.  - Psychiatry specifically recommends to avoid benzodiazepines and anticholinergics-reversible tests - Started on Seroquel  12.5 mg at bedtime which she appears to be tolerating well- is sleeping at night -Unfortunately, mental status has not improved with treating her UTI - TOC assisting with disposition  Suspected UTI: UA was suspicious for UTI. Started on cephalexin  on 12/19/2023-urine culture growing E. coli which is pansensitive - completed  7 day course of Cephalexin   Hypertension: BP stable.  Continue Coreg  and amlodipine     History of coronary artery disease: Status post PCI.  No anginal symptoms.  Currently on aspirin   Hypokalemia: replaced  POA: POA is her daughter whom the patient is estranged from.   Caitlyn [name]- [phone] note if this person has indicated they are not willing to participate in decision making, or if they are not reasonably available, can bypass this person per Raymond statue on informed consent / surrogate decision makers    She has been ambulating in the hall multiple times a day- dc'd Lovenox .       DVT prophylaxis:     Code Status: Full Code   Consultants: Psychiatry   Antimicrobials:  Anti-infectives (From admission, onward)    Start     Dose/Rate Route Frequency Ordered Stop   12/25/23 2200  cephALEXin  (KEFLEX ) capsule 250 mg        250 mg Oral Every 8 hours 12/25/23 1901 12/26/23 1945   12/19/23 2300  cephALEXin  (KEFLEX ) capsule 250 mg        250 mg Oral Every 8 hours 12/19/23 2248 12/24/23 1501        Objective: Vitals:   12/27/23 1257 12/27/23 2108 12/28/23 0421 12/28/23 1400  BP: 127/62 119/82 (!) 144/88 121/77  Pulse: 79 90 74 76  Resp: 16 17 18 15   Temp: 98.9 F (37.2 C) 98.4 F (36.9 C) 98.2 F (36.8 C) 98.5 F (36.9 C)  TempSrc: Oral Oral Oral   SpO2: 99% 97% 100% 99%  Weight:      Height:       No intake or output data in the 24 hours ending 12/28/23 1557  Filed Weights   12/19/23 1703  Weight:  58.5 kg    Examination:  General exam: Overall comfortable, not in distress, poor short term memory and erratic moods HEENT: PERRL Respiratory system:  no wheezes or crackles  Cardiovascular system: S1 & S2 heard, RRR.  Gastrointestinal system: Abdomen is nondistended, soft and nontender. Central nervous system: Alert and oriented to person and place Extremities: No edema, no clubbing ,no cyanosis Skin: No rashes, no ulcers,no icterus     Data  Reviewed: I have personally reviewed following labs and imaging studies  CBC: No results for input(s): WBC, NEUTROABS, HGB, HCT, MCV, PLT in the last 168 hours.  Basic Metabolic Panel: Recent Labs  Lab 12/22/23 0524  NA 138  K 3.6  CL 109  CO2 21*  GLUCOSE 113*  BUN 22  CREATININE 0.56  CALCIUM  8.9        Radiology Studies: No results found.   Scheduled Meds:  amLODipine   5 mg Oral Daily   aspirin  EC  81 mg Oral Daily   carvedilol   6.25 mg Oral BID WC   QUEtiapine   12.5 mg Oral QHS       LOS: 9 days   Laneta Blunt, MD [facility] P8/19/2025, 3:57 PM

## 2023-12-28 NOTE — TOC Progression Note (Addendum)
 Transition of Care [facility]) - Progression Note    Patient Details  Name: Caitlyn [name] MRN: [id number] Date of Birth: 1945-12-30  [facility]) CM/SW Contact  Heather DELENA Saltness, LCSW Phone Number: 12/28/2023, 9:46 AM  Clinical Narrative:     ADDENDUM  3:47 PM - Per Harlene with [facility], [facility], is completing assessment this afternoon for Memory Care.  2:59 PM - CSW spoke with pt's sister, [name] [phone], via phone call to discuss placement. Janelle reports she's been working with Harlene at [facility] for assistance with LTC placement. Janelle reports she has applied for Medicaid on her sister's behalf. Janelle also reports she met with a Realtor this morning to begin the process of selling pt's home to help pay for LTC. Janelle reports Harlene has been looking for an appropriate placement. Message sent to [facility] requesting update. Pt currently under review at [facility] for LTC.  1:37 PM - CSW received phone call from Gustav Hoit [phone], admissions staff at [facility]. Gustav reports facility is willing to review pt for LTC. Referral sent to [facility], [email or web address], via secure email.  CSW received response from Langley at [facility] who reports facility is not currently accepting LOG for LTC placement at this time.  CSW received response from Briana at [facility] who reports she will check with admission team today during their meeting at 10:30 AM. CSW will await update from [place] regarding LOG for LTC at [facility]. TOC will continue to follow and assist with discharge planning.   Expected Discharge Plan: Skilled Nursing Facility Barriers to Discharge: Continued Medical Work up   Expected Discharge Plan and Services In-house Referral: Clinical Social Work Discharge Planning Services: NA Post Acute Care Choice: Skilled Nursing Facility Living arrangements for the past 2 months: Skilled  Nursing Facility                 DME Arranged: N/A DME Agency: NA       HH Arranged: NA HH Agency: NA         Social Drivers of Health (SDOH) Interventions SDOH Screenings   Food Insecurity: No Food Insecurity (12/20/2023)  Housing: Low Risk  (12/20/2023)  Transportation Needs: No Transportation Needs (12/20/2023)  Utilities: Not At Risk (12/20/2023)  Social Connections: Moderately Integrated (12/20/2023)  Tobacco Use: Low Risk  (12/19/2023)    Readmission Risk Interventions    12/20/2023    4:07 PM  Readmission Risk Prevention Plan  Post Dischage Appt Complete  Medication Screening Complete  Transportation Screening Complete    Signed: Heather Saltness, MSW, LCSW Clinical Social Worker Inpatient Care Management 12/28/2023 9:49 AM

## 2023-12-29 ENCOUNTER — Inpatient Hospital Stay (HOSPITAL_COMMUNITY)

## 2023-12-29 DIAGNOSIS — G9341 Metabolic encephalopathy: Secondary | ICD-10-CM | POA: Diagnosis not present

## 2023-12-29 DIAGNOSIS — N3000 Acute cystitis without hematuria: Principal | ICD-10-CM

## 2023-12-29 DIAGNOSIS — F03918 Unspecified dementia, unspecified severity, with other behavioral disturbance: Secondary | ICD-10-CM

## 2023-12-29 NOTE — Plan of Care (Signed)
  Problem: Education: Goal: Knowledge of General Education information will improve Description: Including pain rating scale, medication(s)/side effects and non-pharmacologic comfort measures Outcome: Progressing   Problem: Clinical Measurements: Goal: Ability to maintain clinical measurements within normal limits will improve Outcome: Progressing Goal: Will remain free from infection Outcome: Progressing Goal: Diagnostic test results will improve Outcome: Progressing Goal: Respiratory complications will improve Outcome: Progressing Goal: Cardiovascular complication will be avoided Outcome: Progressing   Problem: Coping: Goal: Level of anxiety will decrease Outcome: Progressing   Problem: Elimination: Goal: Will not experience complications related to bowel motility Outcome: Progressing Goal: Will not experience complications related to urinary retention Outcome: Progressing   Problem: Pain Managment: Goal: General experience of comfort will improve and/or be controlled Outcome: Progressing   Problem: Safety: Goal: Ability to remain free from injury will improve Outcome: Progressing   Problem: Skin Integrity: Goal: Risk for impaired skin integrity will decrease Outcome: Progressing   Problem: Health Behavior/Discharge Planning: Goal: Ability to manage health-related needs will improve Outcome: Not Progressing

## 2023-12-29 NOTE — Hospital Course (Signed)
 78 year old female with history of Alzheimer's dementia, coronary artery disease status post PCI, hypothyroidism, hypertension, hyperlipidemia, GERD, IDA, arthritis who presented for the evaluation of altered mental status. EMS was called by patient's neighbor after she was found standing at her neighbor's door undressed from waist down asking for underwear. Per Triage note, she was more confused than usual. On presentation, she was pleasantly confused. She was admitted for further evaluation of altered mental status, agitation. UA was suspicious for UTI. She repeatedly asked leave from the ED but due to unsafe disposition, psychiatry consulted, IVCed. TOC following and assisting with safe disposition. Patient is not stable to be without supervision due to her dementia.

## 2023-12-29 NOTE — Progress Notes (Signed)
  Progress Note   Patient: Caitlyn [name] FMW:[id number] DOB: [birth year] DOA: 12/19/2023     10 DOS: the patient was seen and examined on 12/29/2023   Brief hospital course: 78 year old female with history of Alzheimer's dementia, coronary artery disease status post PCI, hypothyroidism, hypertension, hyperlipidemia, GERD, IDA, arthritis who presented for the evaluation of altered mental status. EMS was called by patient's neighbor after she was found standing at her neighbor's door undressed from waist down asking for underwear. Per Triage note, she was more confused than usual. On presentation, she was pleasantly confused. She was admitted for further evaluation of altered mental status, agitation. UA was suspicious for UTI. She repeatedly asked leave from the ED but due to unsafe disposition, psychiatry consulted, IVCed. TOC following and assisting with safe disposition. Patient is not stable to be without supervision due to her dementia.   Assessment and Plan: Acute metabolic encephalopathy/delirium/history of Alzheimer's dementia: Presented with worsening confusion from baseline per neighbors.  Could have been aggravated by UTI.  Patient lives alone with her dog. There is concern about patient's ability to take care of herself due to recent increased confusion from baseline, misplacing belongings, and neglecting finances.    - Evaluated by psychiatry-no need for inpatient psychiatric admission or initiation of any antipsychotic medication.  - Psychiatry specifically recommends to avoid benzodiazepines and anticholinergics-reversible tests - Started on Seroquel  12.5 mg at bedtime which she appears to be tolerating well- is sleeping at night -Unfortunately, mental status has not improved with treating her UTI - TOC following for placement   Suspected UTI: UA was suspicious for UTI. Started on cephalexin  on 12/19/2023-urine culture growing E. coli which is pansensitive - completed 7 day course of  Cephalexin    Hypertension: BP remains stable.  Continue Coreg  and amlodipine      History of coronary artery disease: Status post PCI.   Currently on aspirin  -No chest pain or sob today   Hypokalemia: replaced   POA: POA is her daughter whom the patient is estranged from.   [name]- [phone] note if this person has indicated they are not willing to participate in decision making, or if they are not reasonably available, can bypass this person per [place] statue on informed consent / surrogate decision makers        Subjective: Eager to be discharged soon  Physical Exam: Vitals:   12/28/23 2002 12/29/23 0608 12/29/23 0948 12/29/23 1359  BP: (!) 147/74 125/77 134/73 131/63  Pulse: 79 71  87  Resp: 16 16  18   Temp: 98.7 F (37.1 C) 98.3 F (36.8 C)  98.2 F (36.8 C)  TempSrc: Oral Oral  Oral  SpO2: 98% 98%  99%  Weight:      Height:       General exam: Awake, laying in bed, in nad Respiratory system: Normal respiratory effort, no wheezing Cardiovascular system: regular rate, s1, s2 Gastrointestinal system: Soft, nondistended, positive BS Central nervous system: CN2-12 grossly intact, strength intact Extremities: Perfused, no clubbing Skin: Normal skin turgor, no notable skin lesions seen Psychiatry: Mood normal // no visual hallucinations   Data Reviewed:  There are no new results to review at this time.  Family Communication: Pt in room, family not at bedside  Disposition: Status is: Inpatient Remains inpatient appropriate because: severity of illness  Planned Discharge Destination: Unclear at this time    Author: Garnette Pelt, MD 12/29/2023 4:55 PM  For on call review [email or web address].

## 2023-12-29 NOTE — TOC Progression Note (Signed)
 Transition of Care [facility]) - Progression Note    Patient Details  Name: Caitlyn [name] MRN: [id number] Date of Birth: [birth year]  Transition of Care [facility]) CM/SW Contact  Heather DELENA Saltness, LCSW Phone Number: 12/29/2023, 1:40 PM  Clinical Narrative:    CSW spoke with Harlene Ruddy, at [facility], regarding update on LTC placement. Harlene reports pt cussed out Charity fundraiser at [employer] yesterday during assessment. Fredick Lesch and [facility] [place] will complete assessment tomorrow morning. [place] reports pt needs updated FL2 and TB test. Updated FL2 emailed to [place]. MD and RN made aware of pt needing TB test. TOC will continue to follow.   Expected Discharge Plan: Skilled Nursing Facility Barriers to Discharge: Continued Medical Work up   Expected Discharge Plan and Services In-house Referral: Clinical Social Work Discharge Planning Services: NA Post Acute Care Choice: Skilled Nursing Facility Living arrangements for the past 2 months: Skilled Nursing Facility                 DME Arranged: N/A DME Agency: NA       HH Arranged: NA HH Agency: NA         Social Drivers of Health (SDOH) Interventions SDOH Screenings   Food Insecurity: No Food Insecurity (12/20/2023)  Housing: Low Risk  (12/20/2023)  Transportation Needs: No Transportation Needs (12/20/2023)  Utilities: Not At Risk (12/20/2023)  Social Connections: Moderately Integrated (12/20/2023)  Tobacco Use: Low Risk  (12/19/2023)    Readmission Risk Interventions    12/20/2023    4:07 PM  Readmission Risk Prevention Plan  Post Dischage Appt Complete  Medication Screening Complete  Transportation Screening Complete    Signed: Heather Saltness, MSW, LCSW Clinical Social Worker Inpatient Care Management 12/29/2023 2:12 PM

## 2023-12-29 NOTE — Progress Notes (Signed)
 Physical Therapy Treatment and discharge Patient Details Name: Caitlyn [name] MRN: [id number] DOB: 05-25-1945 Today's Date: 12/29/2023   History of Present Illness Pt is a 78 yo female presenting to [facility] on 12/19/23 after EMS was called by patient's neighbor after she was found standing, undressed from waist down asking for underwear.  Pt admitted for AMS and attempted to leave the ED, so psychiatry was consulted and pt ibnvoluntarily commited to hospital.  Pt with past hospitalization this year to [facility] on 05/21/23 for epigastric pain. PMH of MI, HTN, HLD, GERD, IDA, Alzheimer's dementia.    PT Comments   Pt admitted with above diagnosis.  Pt currently with functional limitations due to the deficits listed below (see PT Problem List). Patient has participated with Physical Therapy with no further acute PT needs identified. All education has been completed and the patient has no further questions.  Pt able to participate with higher level dynamic and static NMR tasks today with change in gait velocity, direction, turns, side stepping and retrograde patterns, head turns and nods with no overt LOB no UE support, pt able to perform tapping tasks and sequencing in single limb support with CGA and cues no noted LOB, pt slightly resistive and exhibited difficulty with cognitive challenges with walking tasks today.  See below for any follow-up Physical Therapy or DME needs. PT is signing off. Thank you for this referral.     If plan is discharge home, recommend the following: A little help with bathing/dressing/bathroom;Assistance with cooking/housework;Assist for transportation;Help with stairs or ramp for entrance;A little help with walking and/or transfers;Direct supervision/assist for medications management;Direct supervision/assist for financial management;Supervision due to cognitive status   Can travel by private vehicle     Yes  Equipment Recommendations  None recommended by PT    Recommendations for  Other Services       Precautions / Restrictions Precautions Precautions: Fall;Other (comment) Recall of Precautions/Restrictions: Impaired Precaution/Restrictions Comments: 1:1 sup Restrictions Weight Bearing Restrictions Per Provider Order: No     Mobility  Bed Mobility Overal bed mobility: Independent                  Transfers Overall transfer level: Independent Equipment used: None Transfers: Sit to/from Stand                  Ambulation/Gait Ambulation/Gait assistance: Chief Operating Officer (Feet): 1000 Feet Assistive device: None Gait Pattern/deviations: Step-through pattern Gait velocity: wfl     General Gait Details: pt demonstrates good reciprocal pattern , B LE passing in stance phase, appropriate foot clearance, narrow BOS and no overt LOB   Stairs             Wheelchair Mobility     Tilt Bed    Modified Rankin (Stroke Patients Only)       Balance Overall balance assessment: No apparent balance deficits (not formally assessed) (pt participated with higher level balance challenges static and dynamic at time of d/c from therapy services no overt LOB)                                          Communication Communication Communication: Impaired Factors Affecting Communication: Difficulty expressing self  Cognition Arousal: Alert Behavior During Therapy: WFL for tasks assessed/performed   PT - Cognitive impairments: History of cognitive impairments  Following commands: Intact Following commands impaired: Only follows one step commands consistently (Follows some multi-step commands but will need repetition at times or cues for attention to task, easily fustrated with cognitive challenges)    Cueing Cueing Techniques: Verbal cues, Visual cues, Gestural cues  Exercises      General Comments        Pertinent Vitals/Pain Pain Assessment Pain Assessment: No/denies pain     Home Living                          Prior Function            PT Goals (current goals can now be found in the care plan section) Acute Rehab PT Goals Patient Stated Goal: not able PT Goal Formulation: With patient/family Time For Goal Achievement: 01/03/24 Potential to Achieve Goals: Good Progress towards PT goals: Progressing toward goals    Frequency    Min 2X/week      PT Plan      Co-evaluation              AM-PAC PT 6 Clicks Mobility   Outcome Measure  Help needed turning from your back to your side while in a flat bed without using bedrails?: None Help needed moving from lying on your back to sitting on the side of a flat bed without using bedrails?: None Help needed moving to and from a bed to a chair (including a wheelchair)?: None Help needed standing up from a chair using your arms (e.g., wheelchair or bedside chair)?: None Help needed to walk in hospital room?: None Help needed climbing 3-5 steps with a railing? : A Little 6 Click Score: 23    End of Session Equipment Utilized During Treatment: Gait belt Activity Tolerance: Patient tolerated treatment well Patient left: with nursing/sitter in room;in bed Nurse Communication: Mobility status PT Visit Diagnosis: Unsteadiness on feet (R26.81);Difficulty in walking, not elsewhere classified (R26.2)     Time: 8951-8896 PT Time Calculation (min) (ACUTE ONLY): 15 min  Charges:    $Neuromuscular Re-education: 8-22 mins PT General Charges $$ ACUTE PT VISIT: 1 Visit                     [place], PT Acute Rehab    [place] VEAR Drone 12/29/2023, 3:09 PM

## 2023-12-29 NOTE — [place] FL2 (Signed)
   MEDICAID FL2 LEVEL OF CARE FORM     IDENTIFICATION  Patient Name: Caitlyn [name] Birthdate: 1945/11/20 Sex: female Admission Date (Current Location): 12/19/2023  [facility] and [place] Number:  Erik   Facility and Address:  [facility],  [address], [place] [address]      Provider Number: [id number]  Attending Physician Name and Address:  Cindy Garnette POUR, MD  Relative Name and Phone Number:  [name] (sister) [phone]    Current Level of Care: Hospital Recommended Level of Care: Memory Care, Assisted Living Facility Prior Approval Number:    Date Approved/Denied:   PASRR Number: [id number]  Discharge Plan: Other (Comment) (ALF/Memory care/LTC)    Current Diagnoses: Patient Active Problem List   Diagnosis Date Noted   Acute cystitis without hematuria 12/20/2023   Delirium 12/20/2023   Dementia with behavioral disturbance (HCC) 12/20/2023   Major neurocognitive disorder (HCC) 12/19/2023   Acute metabolic encephalopathy 12/19/2023   Epigastric pain  Elevated Troponin 05/22/2023   Hx of myocardial infarction 05/22/2023   Memory changes 05/22/2023   Unstable angina (HCC) 11/28/2017   Ischemic cardiomyopathy 08/24/2017   Hypertension    Head injury    H/O hiatal hernia    GERD (gastroesophageal reflux disease)    E coli enteritis    Arthritis    Anemia    GI bleeding 06/29/2016   Status post hip replacement, right 06/29/2016   Acute GI bleeding    History of cardiac cath    Coronary artery disease 06/07/2016   Postop check    Displaced intertrochanteric fracture of right femur, initial encounter for closed fracture (HCC) 06/03/2016   Status post insertion of drug-eluting stent into left anterior descending (LAD) artery    NSTEMI (non-ST elevated myocardial infarction) (HCC)    Chest pain 05/30/2016   Hypothyroidism, acquired 05/28/2016   Closed fracture of right hip (HCC) 05/28/2016   Closed right hip fracture (HCC)  05/28/2016   Essential hypertension 10/29/2015   Dyslipidemia, goal LDL below 70 10/29/2015   Mass of right kidney 08/22/2013   Radicular pain of thoracic region 08/22/2013   Iron  deficiency anemia 02/22/2013   Iron  deficiency anemia, unspecified 02/22/2013   Bleeding diathesis (HCC) 02/22/2013    Orientation RESPIRATION BLADDER Height & Weight     Self, Place  Normal Continent Weight: 129 lb (58.5 kg) Height:  5' 4 (162.6 cm)  BEHAVIORAL SYMPTOMS/MOOD NEUROLOGICAL BOWEL NUTRITION STATUS      Continent Diet (Regular)  AMBULATORY STATUS COMMUNICATION OF NEEDS Skin   Independent Verbally Normal                       Personal Care Assistance Level of Assistance  Bathing, Feeding, Dressing Bathing Assistance: Limited assistance Feeding assistance: Independent Dressing Assistance: Limited assistance     Functional Limitations Info  Sight, Hearing, Speech Sight Info: Adequate Hearing Info: Adequate Speech Info: Adequate    SPECIAL CARE FACTORS FREQUENCY  PT (By licensed PT), OT (By licensed OT)     PT Frequency: 2-3x per week OT Frequency: 2-3x per week            Contractures Contractures Info: Not present    Additional Factors Info  Code Status, Allergies Code Status Info: FULL Allergies Info: Cortone Acetate (Cortisone), Crestor  (Rosuvastatin  Calcium ), Demerol (Meperidine), Monosodium Glutamate, Morphine And Codeine, Prednisone, Shellfish Allergy, Sulfa Antibiotics, Hctz (Hydrochlorothiazide ), Readi-cat (Barium Sulfate), Xanax (Alprazolam), Ceftin (Cefuroxime), Flagyl (Metronidazole), Macrodantin (Nitrofurantoin), Praluent  (Alirocumab ), Repatha  (Evolocumab ), Wellbutrin (Bupropion),  Benzoic Acid, Butrans (Buprenorphine), Cipro (Ciprofloxacin Hcl), Influenza Vaccines           Current Medications (12/29/2023):  This is the current hospital active medication list Current Facility-Administered Medications  Medication Dose Route Frequency Provider Last Rate Last  Admin   acetaminophen  (TYLENOL ) tablet 650 mg  650 mg Oral Q6H PRN Amponsah, Prosper M, MD   650 mg at 12/20/23 2141   Or   acetaminophen  (TYLENOL ) suppository 650 mg  650 mg Rectal Q6H PRN Amponsah, Prosper M, MD       alum & mag hydroxide-simeth (MAALOX/MYLANTA) 200-200-20 MG/5ML suspension 30 mL  30 mL Oral Q4H PRN Rizwan, Saima, MD   30 mL at 12/26/23 2051   amLODipine  (NORVASC ) tablet 5 mg  5 mg Oral Daily Amponsah, Prosper M, MD   5 mg at 12/29/23 9051   aspirin  EC tablet 81 mg  81 mg Oral Daily Amponsah, Prosper M, MD   81 mg at 12/29/23 9051   bisacodyl  (DULCOLAX) EC tablet 5 mg  5 mg Oral Daily PRN Amponsah, Prosper M, MD       carvedilol  (COREG ) tablet 6.25 mg  6.25 mg Oral BID WC Amponsah, Prosper M, MD   6.25 mg at 12/29/23 9052   haloperidol  lactate (HALDOL ) injection 2 mg  2 mg Intramuscular Once PRN Daniels, James K, NP       Muscle Rub CREA 1 Application  1 Application Topical PRN Jillian Buttery, MD       ondansetron  (ZOFRAN ) tablet 4 mg  4 mg Oral Q6H PRN Amponsah, Prosper M, MD       Or   ondansetron  (ZOFRAN ) injection 4 mg  4 mg Intravenous Q6H PRN Amponsah, Prosper M, MD       Oral care mouth rinse  15 mL Mouth Rinse PRN Jillian Buttery, MD       QUEtiapine  (SEROQUEL ) tablet 12.5 mg  12.5 mg Oral QHS Jillian Buttery, MD   12.5 mg at 12/28/23 2116   senna-docusate (Senokot-S) tablet 1 tablet  1 tablet Oral QHS PRN Amponsah, Prosper M, MD         Discharge Medications: Please see discharge summary for a list of discharge medications.  Relevant Imaging Results:  Relevant Lab Results:   Additional Information SSN: [id number]  Heather LABOR Sachiko Methot, LCSW

## 2023-12-30 DIAGNOSIS — N3000 Acute cystitis without hematuria: Secondary | ICD-10-CM | POA: Diagnosis not present

## 2023-12-30 DIAGNOSIS — G9341 Metabolic encephalopathy: Secondary | ICD-10-CM | POA: Diagnosis not present

## 2023-12-30 DIAGNOSIS — F03918 Unspecified dementia, unspecified severity, with other behavioral disturbance: Secondary | ICD-10-CM | POA: Diagnosis not present

## 2023-12-30 LAB — COMPREHENSIVE METABOLIC PANEL WITH GFR
ALT: 17 U/L (ref 0–44)
AST: 18 U/L (ref 15–41)
Albumin: 3 g/dL — ABNORMAL LOW (ref 3.5–5.0)
Alkaline Phosphatase: 61 U/L (ref 38–126)
Anion gap: 7 (ref 5–15)
BUN: 17 mg/dL (ref 8–23)
CO2: 24 mmol/L (ref 22–32)
Calcium: 8.7 mg/dL — ABNORMAL LOW (ref 8.9–10.3)
Chloride: 108 mmol/L (ref 98–111)
Creatinine, Ser: 0.56 mg/dL (ref 0.44–1.00)
GFR, Estimated: >60 mL/min (ref >60)
Glucose, Bld: 107 mg/dL — ABNORMAL HIGH (ref 70–99)
Potassium: 3.8 mmol/L (ref 3.5–5.1)
Sodium: 139 mmol/L (ref 135–145)
Total Bilirubin: 0.4 mg/dL (ref 0.0–1.2)
Total Protein: 5.7 g/dL — ABNORMAL LOW (ref 6.5–8.1)

## 2023-12-30 LAB — CBC
HCT: 38 % (ref 36.0–46.0)
Hemoglobin: 12 g/dL (ref 12.0–15.0)
MCH: 29.1 pg (ref 26.0–34.0)
MCHC: 31.6 g/dL (ref 30.0–36.0)
MCV: 92.2 fL (ref 80.0–100.0)
Platelets: 187 K/uL (ref 150–400)
RBC: 4.12 MIL/uL (ref 3.87–5.11)
RDW: 13.9 % (ref 11.5–15.5)
WBC: 6.3 K/uL (ref 4.0–10.5)
nRBC: 0 % (ref 0.0–0.2)

## 2023-12-30 MED ORDER — TUBERCULIN PPD 5 UNIT/0.1ML ID SOLN
5.0000 [IU] | Freq: Once | INTRADERMAL | Status: AC
  Administered 2023-12-30: 5 [IU] via INTRADERMAL
  Filled 2023-12-30: qty 0.1

## 2023-12-30 NOTE — Plan of Care (Signed)
  Problem: Education: Goal: Knowledge of General Education information will improve Description: Including pain rating scale, medication(s)/side effects and non-pharmacologic comfort measures Outcome: Progressing   Problem: Health Behavior/Discharge Planning: Goal: Ability to manage health-related needs will improve Outcome: Progressing   Problem: Clinical Measurements: Goal: Ability to maintain clinical measurements within normal limits will improve Outcome: Progressing Goal: Will remain free from infection Outcome: Progressing Goal: Diagnostic test results will improve Outcome: Progressing Goal: Respiratory complications will improve Outcome: Progressing Goal: Cardiovascular complication will be avoided Outcome: Progressing   Problem: Coping: Goal: Level of anxiety will decrease Outcome: Progressing   Problem: Elimination: Goal: Will not experience complications related to bowel motility Outcome: Progressing Goal: Will not experience complications related to urinary retention Outcome: Progressing   Problem: Pain Managment: Goal: General experience of comfort will improve and/or be controlled Outcome: Progressing   Problem: Safety: Goal: Ability to remain free from injury will improve Outcome: Progressing   Problem: Skin Integrity: Goal: Risk for impaired skin integrity will decrease Outcome: Progressing

## 2023-12-30 NOTE — TOC Progression Note (Signed)
 Transition of Care [facility]) - Progression Note    Patient Details  Name: Caitlyn [name] MRN: [id number] Date of Birth: 1945-08-08  [facility]) CM/SW Contact  Sonda Manuella Quill, RN Phone Number: 12/30/2023, 3:28 PM  Clinical Narrative:    [place] w/ Karna Fiddler, Admissions at [facility] [phone]); she said pt's family has accepted bed on Memory Care Unit; pt's dtr is scheduled to sign paperwork tomorrow at 10 AM; pt can admit on Mon 8/25; Karna gave RM # [phone], call report # [phone] [facility] Summer Virlinda, Director [facility] [place] [facility]); she also gave facility address: [address] [place] [address]; Karna said documents needed: Adult FL2 forms, report of TB testing/screening, and diet order form; documents placed on shadow chart for MD completion; Karna also said medication may be need to be sent w/ pt depending on the time that dtr signs paperwork; documents/RXs can be faxed to [phone]; Dr Cindy notified via secure chat; awaiting completion of documents.   Expected Discharge Plan: Skilled Nursing Facility Barriers to Discharge: Continued Medical Work up               Expected Discharge Plan and Services In-house Referral: Clinical Social Work Discharge Planning Services: NA Post Acute Care Choice: Skilled Nursing Facility Living arrangements for the past 2 months: Skilled Nursing Facility                 DME Arranged: N/A DME Agency: NA       HH Arranged: NA HH Agency: NA         Social Drivers of Health (SDOH) Interventions SDOH Screenings   Food Insecurity: No Food Insecurity (12/20/2023)  Housing: Low Risk  (12/20/2023)  Transportation Needs: No Transportation Needs (12/20/2023)  Utilities: Not At Risk (12/20/2023)  Social Connections: Moderately Integrated (12/20/2023)  Tobacco Use: Low Risk  (12/19/2023)    Readmission Risk Interventions    12/20/2023    4:07 PM  Readmission Risk Prevention Plan  Post Dischage Appt Complete   Medication Screening Complete  Transportation Screening Complete

## 2023-12-30 NOTE — TOC Progression Note (Signed)
 Transition of Care [facility]) - Progression Note    Patient Details  Name: Caitlyn [name] MRN: [id number] Date of Birth: 12-Oct-1945  Transition of Care [facility]) CM/SW Contact  Heather DELENA Saltness, LCSW Phone Number: 12/30/2023, 3:11 PM  Clinical Narrative:    CSW received phone call from [place] [phone], admissions staff, at [facility]. Pt has been accepted to [facility] unit. Pt can arrive on Monday 8/25, pending negative TB test. FL2 and progress notes faxed to facility. MD made aware. TOC will continue to follow and assist with discharge planning.   Expected Discharge Plan: Skilled Nursing Facility Barriers to Discharge: Continued Medical Work up   Expected Discharge Plan and Services In-house Referral: Clinical Social Work Discharge Planning Services: NA Post Acute Care Choice: Skilled Nursing Facility Living arrangements for the past 2 months: Skilled Nursing Facility                 DME Arranged: N/A DME Agency: NA       HH Arranged: NA HH Agency: NA         Social Drivers of Health (SDOH) Interventions SDOH Screenings   Food Insecurity: No Food Insecurity (12/20/2023)  Housing: Low Risk  (12/20/2023)  Transportation Needs: No Transportation Needs (12/20/2023)  Utilities: Not At Risk (12/20/2023)  Social Connections: Moderately Integrated (12/20/2023)  Tobacco Use: Low Risk  (12/19/2023)    Readmission Risk Interventions    12/20/2023    4:07 PM  Readmission Risk Prevention Plan  Post Dischage Appt Complete  Medication Screening Complete  Transportation Screening Complete    Signed: Heather Saltness, MSW, LCSW Clinical Social Worker Inpatient Care Management 12/30/2023 3:15 PM

## 2023-12-30 NOTE — Progress Notes (Signed)
  Progress Note   Patient: Caitlyn [name] FMW:[id number] DOB: Feb 22, 1946 DOA: 12/19/2023     11 DOS: the patient was seen and examined on 12/30/2023   Brief hospital course: 78 year old female with history of Alzheimer's dementia, coronary artery disease status post PCI, hypothyroidism, hypertension, hyperlipidemia, GERD, IDA, arthritis who presented for the evaluation of altered mental status. EMS was called by patient's neighbor after she was found standing at her neighbor's door undressed from waist down asking for underwear. Per Triage note, she was more confused than usual. On presentation, she was pleasantly confused. She was admitted for further evaluation of altered mental status, agitation. UA was suspicious for UTI. She repeatedly asked leave from the ED but due to unsafe disposition, psychiatry consulted, IVCed. TOC following and assisting with safe disposition. Patient is not stable to be without supervision due to her dementia.   Assessment and Plan: Acute metabolic encephalopathy/delirium/history of Alzheimer's dementia: Presented with worsening confusion from baseline per neighbors.  Could have been aggravated by UTI.  Patient lives alone with her dog. There is concern about patient's ability to take care of herself due to recent increased confusion from baseline, misplacing belongings, and neglecting finances.    - Evaluated by psychiatry-no need for inpatient psychiatric admission or initiation of any antipsychotic medication.  - Psychiatry specifically recommends to avoid benzodiazepines and anticholinergics-reversible tests - Started on Seroquel  12.5 mg at bedtime which she appears to be tolerating well- is sleeping at night -Unfortunately, mental status has not improved with treating her UTI - TOC continues to follow for placement. Plan LTC early next week. TB tuberculin test ordered   Suspected UTI: UA was suspicious for UTI. Started on cephalexin  on 12/19/2023-urine culture growing  E. coli which is pansensitive - completed 7 day course of Cephalexin    Hypertension: BP remains stable.  Continue Coreg  and amlodipine      History of coronary artery disease: Status post PCI.   Currently on aspirin  -No chest pain or sob today   Hypokalemia: replaced   POA: POA is her daughter whom the patient is estranged from.   Kimberly Zesiger- [phone] note if this person has indicated they are not willing to participate in decision making, or if they are not reasonably available, can bypass this person per [place] statue on informed consent / surrogate decision makers        Subjective: Hoping to go home soon  Physical Exam: Vitals:   12/29/23 1359 12/29/23 1920 12/30/23 0403 12/30/23 1435  BP: 131/63 123/69 120/64 132/82  Pulse: 87 89 69 78  Resp: 18 17 16 20   Temp: 98.2 F (36.8 C) 97.6 F (36.4 C) 98.2 F (36.8 C) 98.6 F (37 C)  TempSrc: Oral Oral Oral Axillary  SpO2: 99% 95% 96% 100%  Weight:      Height:       General exam: Conversant, in no acute distress Respiratory system: normal chest rise, clear, no audible wheezing Cardiovascular system: regular rhythm, s1-s2 Gastrointestinal system: Nondistended, nontender, pos BS Central nervous system: No seizures, no tremors Extremities: No cyanosis, no joint deformities Skin: No rashes, no pallor Psychiatry: Affect normal // no auditory hallucinations   Data Reviewed:  There are no new results to review at this time.  Family Communication: Pt in room, family not at bedside  Disposition: Status is: Inpatient Remains inpatient appropriate because: severity of illness  Planned Discharge Destination: LTC    Author: Garnette Pelt, MD 12/30/2023 4:34 PM  For on call review [email or web address].

## 2023-12-30 NOTE — Plan of Care (Signed)
  Problem: Health Behavior/Discharge Planning: Goal: Ability to manage health-related needs will improve Outcome: Progressing   Problem: Clinical Measurements: Goal: Ability to maintain clinical measurements within normal limits will improve Outcome: Progressing Goal: Respiratory complications will improve Outcome: Progressing Goal: Cardiovascular complication will be avoided Outcome: Progressing   Problem: Elimination: Goal: Will not experience complications related to urinary retention Outcome: Progressing   Problem: Pain Managment: Goal: General experience of comfort will improve and/or be controlled Outcome: Progressing   Problem: Skin Integrity: Goal: Risk for impaired skin integrity will decrease Outcome: Progressing

## 2023-12-31 DIAGNOSIS — N3000 Acute cystitis without hematuria: Secondary | ICD-10-CM | POA: Diagnosis not present

## 2023-12-31 DIAGNOSIS — F03918 Unspecified dementia, unspecified severity, with other behavioral disturbance: Secondary | ICD-10-CM | POA: Diagnosis not present

## 2023-12-31 DIAGNOSIS — G9341 Metabolic encephalopathy: Secondary | ICD-10-CM | POA: Diagnosis not present

## 2023-12-31 DIAGNOSIS — Z9183 Wandering in diseases classified elsewhere: Secondary | ICD-10-CM

## 2023-12-31 NOTE — Plan of Care (Signed)
  Problem: Education: Goal: Knowledge of General Education information will improve Description: Including pain rating scale, medication(s)/side effects and non-pharmacologic comfort measures Outcome: Progressing   Problem: Coping: Goal: Level of anxiety will decrease Outcome: Progressing   Problem: Elimination: Goal: Will not experience complications related to bowel motility Outcome: Progressing Goal: Will not experience complications related to urinary retention Outcome: Progressing   Problem: Pain Managment: Goal: General experience of comfort will improve and/or be controlled Outcome: Progressing   Problem: Safety: Goal: Ability to remain free from injury will improve Outcome: Progressing   Problem: Skin Integrity: Goal: Risk for impaired skin integrity will decrease Outcome: Progressing

## 2023-12-31 NOTE — Progress Notes (Signed)
  Progress Note   Patient: Caitlyn [name] FMW:[id number] DOB: August 15, 1945 DOA: 12/19/2023     12 DOS: the patient was seen and examined on 12/31/2023   Brief hospital course: 78 year old female with history of Alzheimer's dementia, coronary artery disease status post PCI, hypothyroidism, hypertension, hyperlipidemia, GERD, IDA, arthritis who presented for the evaluation of altered mental status. EMS was called by patient's neighbor after she was found standing at her neighbor's door undressed from waist down asking for underwear. Per Triage note, she was more confused than usual. On presentation, she was pleasantly confused. She was admitted for further evaluation of altered mental status, agitation. UA was suspicious for UTI. She repeatedly asked leave from the ED but due to unsafe disposition, psychiatry consulted, IVCed. TOC following and assisting with safe disposition. Patient is not stable to be without supervision due to her dementia.   Assessment and Plan: Acute metabolic encephalopathy/delirium/history of Alzheimer's dementia: Presented with worsening confusion from baseline per neighbors.  Could have been aggravated by UTI.  Patient lives alone with her dog. There is concern about patient's ability to take care of herself due to recent increased confusion from baseline, misplacing belongings, and neglecting finances.    - Evaluated by psychiatry-no need for inpatient psychiatric admission or initiation of any antipsychotic medication.  - Psychiatry specifically recommends to avoid benzodiazepines and anticholinergics-reversible tests - Started on Seroquel  12.5 mg at bedtime which she appears to be tolerating well- is sleeping at night -Unfortunately, mental status has not improved with treating her UTI - TOC continues to follow for placement. Plan LTC early next week. TB tuberculin test underway   Suspected UTI: UA was suspicious for UTI. Started on cephalexin  on 12/19/2023-urine culture  growing E. coli which is pansensitive - completed 7 day course of Cephalexin    Hypertension: BP remains stable.  Continue Coreg  and amlodipine      History of coronary artery disease: Status post PCI.   Currently on aspirin  -No chest pain or sob today   Hypokalemia: replaced   POA: POA is her daughter whom the patient is estranged from.   Caitlyn [name]- [phone] note if this person has indicated they are not willing to participate in decision making, or if they are not reasonably available, can bypass this person per [place] statue on informed consent / surrogate decision makers      Subjective: Eager to go home soon  Physical Exam: Vitals:   12/29/23 1920 12/30/23 0403 12/30/23 1435 12/31/23 1317  BP: 123/69 120/64 132/82 139/81  Pulse: 89 69 78 86  Resp: 17 16 20    Temp: 97.6 F (36.4 C) 98.2 F (36.8 C) 98.6 F (37 C) 98.2 F (36.8 C)  TempSrc: Oral Oral Axillary Oral  SpO2: 95% 96% 100% 98%  Weight:      Height:       General exam: Awake, laying in bed, in nad Respiratory system: Normal respiratory effort, no wheezing Cardiovascular system: regular rate, s1, s2 Gastrointestinal system: Soft, nondistended, positive BS Central nervous system: CN2-12 grossly intact, strength intact Extremities: Perfused, no clubbing Skin: Normal skin turgor, no notable skin lesions seen Psychiatry: Mood normal // no visual hallucinations   Data Reviewed:  There are no new results to review at this time.  Family Communication: Pt in room, family not at bedside  Disposition: Status is: Inpatient Remains inpatient appropriate because: severity of illness  Planned Discharge Destination: LTC    Author: Garnette Pelt, MD 12/31/2023 3:20 PM  For on call review [email or web address].

## 2024-01-01 DIAGNOSIS — F03918 Unspecified dementia, unspecified severity, with other behavioral disturbance: Secondary | ICD-10-CM | POA: Diagnosis not present

## 2024-01-01 DIAGNOSIS — N3000 Acute cystitis without hematuria: Secondary | ICD-10-CM | POA: Diagnosis not present

## 2024-01-01 DIAGNOSIS — G9341 Metabolic encephalopathy: Secondary | ICD-10-CM | POA: Diagnosis not present

## 2024-01-01 NOTE — Plan of Care (Signed)
  Problem: Education: Goal: Knowledge of General Education information will improve Description: Including pain rating scale, medication(s)/side effects and non-pharmacologic comfort measures Outcome: Progressing   Problem: Health Behavior/Discharge Planning: Goal: Ability to manage health-related needs will improve Outcome: Progressing   Problem: Clinical Measurements: Goal: Ability to maintain clinical measurements within normal limits will improve Outcome: Progressing Goal: Will remain free from infection Outcome: Progressing Goal: Diagnostic test results will improve Outcome: Progressing Goal: Respiratory complications will improve Outcome: Progressing Goal: Cardiovascular complication will be avoided Outcome: Progressing   Problem: Coping: Goal: Level of anxiety will decrease Outcome: Progressing   Problem: Elimination: Goal: Will not experience complications related to bowel motility Outcome: Progressing Goal: Will not experience complications related to urinary retention Outcome: Progressing   Problem: Pain Managment: Goal: General experience of comfort will improve and/or be controlled Outcome: Progressing   Problem: Safety: Goal: Ability to remain free from injury will improve Outcome: Progressing   Problem: Skin Integrity: Goal: Risk for impaired skin integrity will decrease Outcome: Progressing

## 2024-01-01 NOTE — Progress Notes (Signed)
  Progress Note   Patient: Caitlyn [name] FMW:[id number] DOB: 02-Feb-1946 DOA: 12/19/2023     13 DOS: the patient was seen and examined on 01/01/2024   Brief hospital course: 78 year old female with history of Alzheimer's dementia, coronary artery disease status post PCI, hypothyroidism, hypertension, hyperlipidemia, GERD, IDA, arthritis who presented for the evaluation of altered mental status. EMS was called by patient's neighbor after she was found standing at her neighbor's door undressed from waist down asking for underwear. Per Triage note, she was more confused than usual. On presentation, she was pleasantly confused. She was admitted for further evaluation of altered mental status, agitation. UA was suspicious for UTI. She repeatedly asked leave from the ED but due to unsafe disposition, psychiatry consulted, IVCed. TOC following and assisting with safe disposition. Patient is not stable to be without supervision due to her dementia.   Assessment and Plan: Acute metabolic encephalopathy/delirium/history of Alzheimer's dementia: Presented with worsening confusion from baseline per neighbors.  Could have been aggravated by UTI.  Patient lives alone with her dog. There is concern about patient's ability to take care of herself due to recent increased confusion from baseline, misplacing belongings, and neglecting finances.    - Evaluated by psychiatry-no need for inpatient psychiatric admission or initiation of any antipsychotic medication.  - Psychiatry specifically recommends to avoid benzodiazepines and anticholinergics-reversible tests - Started on Seroquel  12.5 mg at bedtime which she appears to be tolerating well- is sleeping at night -Unfortunately, mental status has not improved with treating her UTI - TOC continues to follow for placement. Plan LTC early next week. TB tuberculin test underway   Suspected UTI: UA was suspicious for UTI. Started on cephalexin  on 12/19/2023-urine culture  growing E. coli which is pansensitive - completed 7 day course of Cephalexin    Hypertension: BP remains stable.  Continue Coreg  and amlodipine      History of coronary artery disease: Status post PCI.   Currently on aspirin  -No chest pain or sob today   Hypokalemia: replaced   POA: POA is her daughter whom the patient is estranged from.   Kimberly Zesiger- [phone] note if this person has indicated they are not willing to participate in decision making, or if they are not reasonably available, can bypass this person per [place] statue on informed consent / surrogate decision makers      Subjective: Without complaints this AM  Physical Exam: Vitals:   12/31/23 1317 12/31/23 2035 12/31/23 2237 01/01/24 0645  BP: 139/81 134/77 (!) 162/53 138/75  Pulse: 86 82 76 74  Resp:  16 18 16   Temp: 98.2 F (36.8 C) 98 F (36.7 C) 99.1 F (37.3 C) 98 F (36.7 C)  TempSrc: Oral Oral Oral Oral  SpO2: 98% 96% 94% 99%  Weight:      Height:       General exam: Awake, laying in bed, in nad Respiratory system: Normal respiratory effort, no wheezing Cardiovascular system: regular rate, s1, s2 Gastrointestinal system: Soft, nondistended, positive BS Central nervous system: CN2-12 grossly intact, strength intact Extremities: Perfused, no clubbing Skin: Normal skin turgor, no notable skin lesions seen Psychiatry: Mood normal // no visual hallucinations   Data Reviewed:  There are no new results to review at this time.  Family Communication: Pt in room, family not at bedside  Disposition: Status is: Inpatient Remains inpatient appropriate because: severity of illness  Planned Discharge Destination: LTC    Author: Garnette Pelt, MD 01/01/2024 1:56 PM  For on call review [email or web address].

## 2024-01-01 NOTE — Progress Notes (Signed)
 PPD Follow-up: Read patient's tuberculin skin test 48 hours after administration.  Pt does not have an induration at injection site. Noted with 0 mm induration on RFA. Dr Cindy made aware.

## 2024-01-02 DIAGNOSIS — F03918 Unspecified dementia, unspecified severity, with other behavioral disturbance: Secondary | ICD-10-CM

## 2024-01-02 DIAGNOSIS — N3000 Acute cystitis without hematuria: Secondary | ICD-10-CM | POA: Diagnosis not present

## 2024-01-02 DIAGNOSIS — G9341 Metabolic encephalopathy: Secondary | ICD-10-CM | POA: Diagnosis not present

## 2024-01-02 NOTE — Progress Notes (Signed)
  Progress Note   Patient: Caitlyn [name] FMW:[id number] DOB: 1945/09/28 DOA: 12/19/2023     14 DOS: the patient was seen and examined on 01/02/2024   Brief hospital course: 78 year old female with history of Alzheimer's dementia, coronary artery disease status post PCI, hypothyroidism, hypertension, hyperlipidemia, GERD, IDA, arthritis who presented for the evaluation of altered mental status. EMS was called by patient's neighbor after she was found standing at her neighbor's door undressed from waist down asking for underwear. Per Triage note, she was more confused than usual. On presentation, she was pleasantly confused. She was admitted for further evaluation of altered mental status, agitation. UA was suspicious for UTI. She repeatedly asked leave from the ED but due to unsafe disposition, psychiatry consulted, IVCed. TOC following and assisting with safe disposition. Patient is not stable to be without supervision due to her dementia.   Assessment and Plan: Acute metabolic encephalopathy/delirium/history of Alzheimer's dementia: Presented with worsening confusion from baseline per neighbors.  Could have been aggravated by UTI.  Patient lives alone with her dog. There is concern about patient's ability to take care of herself due to recent increased confusion from baseline, misplacing belongings, and neglecting finances.    - Evaluated by psychiatry-no need for inpatient psychiatric admission or initiation of any antipsychotic medication.  - Psychiatry specifically recommends to avoid benzodiazepines and anticholinergics-reversible tests - Started on Seroquel  12.5 mg at bedtime which she appears to be tolerating well- is sleeping at night -Continues to have short term memory issues - TOC continues to follow for placement. Plan LTC this next week. TB tuberculin test is confirmed neg   Suspected UTI: UA was suspicious for UTI. Started on cephalexin  on 12/19/2023-urine culture growing E. coli which is  pansensitive - completed 7 day course of Cephalexin    Hypertension: BP remains stable.  Continue Coreg  and amlodipine      History of coronary artery disease: Status post PCI.   Currently on aspirin  -No chest pain or sob today   Hypokalemia: replaced   POA: POA is her daughter whom the patient is estranged from.   [name]- [phone] note if this person has indicated they are not willing to participate in decision making, or if they are not reasonably available, can bypass this person per [place] statue on informed consent / surrogate decision makers      Subjective: No complaints this AM  Physical Exam: Vitals:   01/01/24 0645 01/02/24 0446 01/02/24 0809 01/02/24 1236  BP: 138/75 134/68 134/67 129/67  Pulse: 74 79 78 81  Resp: 16 16 16 18   Temp: 98 F (36.7 C) 98.5 F (36.9 C) 98.5 F (36.9 C) 97.9 F (36.6 C)  TempSrc: Oral Oral Oral   SpO2: 99%  97% 97%  Weight:      Height:       General exam: Conversant, in no acute distress Respiratory system: normal chest rise, clear, no audible wheezing Cardiovascular system: regular rhythm, s1-s2 Gastrointestinal system: Nondistended, nontender, pos BS Central nervous system: No seizures, no tremors Extremities: No cyanosis, no joint deformities Skin: No rashes, no pallor Psychiatry: Affect normal // no auditory hallucinations   Data Reviewed:  Labs reviewed: Na 139, K 3.8, Cr 0.56, WBC 6.3, Hgb 12.0, Plts 187  Family Communication: Pt in room, family not at bedside  Disposition: Status is: Inpatient Remains inpatient appropriate because: severity of illness  Planned Discharge Destination: LTC    Author: Garnette Pelt, MD 01/02/2024 4:35 PM  For on call review [email or web address].

## 2024-01-02 NOTE — TOC Progression Note (Addendum)
 Transition of Care [facility]) - Progression Note    Patient Details  Name: Caitlyn [name] MRN: [id number] Date of Birth: 11-21-1945  [facility] CM/SW Contact  Heather DELENA Saltness, LCSW Phone Number: 01/02/2024, 1:23 PM  Clinical Narrative:    Pt to discharge to [facility] tomorrow. IVC will need to be rescinded tomorrow. Pt to be transported by [employer]. TOC will continue to follow.   Expected Discharge Plan: Skilled Nursing Facility Barriers to Discharge: Continued Medical Work up   Expected Discharge Plan and Services In-house Referral: Clinical Social Work Discharge Planning Services: NA Post Acute Care Choice: Skilled Nursing Facility Living arrangements for the past 2 months: Skilled Nursing Facility                 DME Arranged: N/A DME Agency: NA       HH Arranged: NA HH Agency: NA         Social Drivers of Health (SDOH) Interventions SDOH Screenings   Food Insecurity: No Food Insecurity (12/20/2023)  Housing: Low Risk  (12/20/2023)  Transportation Needs: No Transportation Needs (12/20/2023)  Utilities: Not At Risk (12/20/2023)  Social Connections: Moderately Integrated (12/20/2023)  Tobacco Use: Low Risk  (12/19/2023)    Readmission Risk Interventions    12/20/2023    4:07 PM  Readmission Risk Prevention Plan  Post Dischage Appt Complete  Medication Screening Complete  Transportation Screening Complete    Signed: Heather Saltness, MSW, LCSW Clinical Social Worker Inpatient Care Management 01/02/2024 1:29 PM

## 2024-01-02 NOTE — Plan of Care (Signed)
  Problem: Education: Goal: Knowledge of General Education information will improve Description: Including pain rating scale, medication(s)/side effects and non-pharmacologic comfort measures Outcome: Progressing   Problem: Health Behavior/Discharge Planning: Goal: Ability to manage health-related needs will improve Outcome: Progressing   Problem: Clinical Measurements: Goal: Ability to maintain clinical measurements within normal limits will improve Outcome: Progressing Goal: Will remain free from infection Outcome: Progressing Goal: Diagnostic test results will improve Outcome: Progressing Goal: Respiratory complications will improve Outcome: Progressing Goal: Cardiovascular complication will be avoided Outcome: Progressing   Problem: Coping: Goal: Level of anxiety will decrease Outcome: Progressing   Problem: Elimination: Goal: Will not experience complications related to bowel motility Outcome: Progressing Goal: Will not experience complications related to urinary retention Outcome: Progressing   Problem: Pain Managment: Goal: General experience of comfort will improve and/or be controlled Outcome: Progressing   Problem: Safety: Goal: Ability to remain free from injury will improve Outcome: Progressing   Problem: Skin Integrity: Goal: Risk for impaired skin integrity will decrease Outcome: Progressing

## 2024-01-02 NOTE — Plan of Care (Signed)
  Problem: Education: Goal: Knowledge of General Education information will improve Description: Including pain rating scale, medication(s)/side effects and non-pharmacologic comfort measures Outcome: Progressing   Problem: Health Behavior/Discharge Planning: Goal: Ability to manage health-related needs will improve Outcome: Progressing   Problem: Clinical Measurements: Goal: Ability to maintain clinical measurements within normal limits will improve Outcome: Progressing Goal: Will remain free from infection Outcome: Progressing Goal: Diagnostic test results will improve Outcome: Progressing Goal: Respiratory complications will improve Outcome: Progressing Goal: Cardiovascular complication will be avoided Outcome: Progressing   Problem: Elimination: Goal: Will not experience complications related to bowel motility Outcome: Progressing Goal: Will not experience complications related to urinary retention Outcome: Progressing   Problem: Pain Managment: Goal: General experience of comfort will improve and/or be controlled Outcome: Progressing   Problem: Skin Integrity: Goal: Risk for impaired skin integrity will decrease Outcome: Progressing   Problem: Coping: Goal: Level of anxiety will decrease Outcome: Not Progressing   Problem: Safety: Goal: Ability to remain free from injury will improve Outcome: Not Progressing

## 2024-01-03 ENCOUNTER — Encounter: Payer: Self-pay | Admitting: Family

## 2024-01-03 ENCOUNTER — Other Ambulatory Visit (HOSPITAL_COMMUNITY): Payer: Self-pay

## 2024-01-03 DIAGNOSIS — N3000 Acute cystitis without hematuria: Secondary | ICD-10-CM | POA: Diagnosis not present

## 2024-01-03 DIAGNOSIS — F03918 Unspecified dementia, unspecified severity, with other behavioral disturbance: Secondary | ICD-10-CM | POA: Diagnosis not present

## 2024-01-03 DIAGNOSIS — G9341 Metabolic encephalopathy: Secondary | ICD-10-CM | POA: Diagnosis not present

## 2024-01-03 MED ORDER — SENNOSIDES-DOCUSATE SODIUM 8.6-50 MG PO TABS
1.0000 | ORAL_TABLET | Freq: Every evening | ORAL | 0 refills | Status: AC | PRN
  Filled 2024-01-03: qty 10, 10d supply, fill #0

## 2024-01-03 MED ORDER — ASPIRIN 81 MG PO TBEC
81.0000 mg | DELAYED_RELEASE_TABLET | Freq: Every day | ORAL | 0 refills | Status: AC
  Filled 2024-01-03: qty 30, 30d supply, fill #0

## 2024-01-03 MED ORDER — AMLODIPINE BESYLATE 5 MG PO TABS
5.0000 mg | ORAL_TABLET | Freq: Every day | ORAL | 0 refills | Status: AC
  Filled 2024-01-03: qty 30, 30d supply, fill #0

## 2024-01-03 MED ORDER — CARVEDILOL 6.25 MG PO TABS
6.2500 mg | ORAL_TABLET | Freq: Two times a day (BID) | ORAL | 0 refills | Status: AC
  Filled 2024-01-03: qty 60, 30d supply, fill #0

## 2024-01-03 MED ORDER — QUETIAPINE FUMARATE 25 MG PO TABS
12.5000 mg | ORAL_TABLET | Freq: Every day | ORAL | 0 refills | Status: AC
  Filled 2024-01-03: qty 15, 30d supply, fill #0

## 2024-01-03 NOTE — Care Management Important Message (Signed)
 Important Message  Patient Details IM Letter given Name: [name] MRN: [id number] Date of Birth: Sep 30, 1945   Important Message Given:  Yes - Medicare IM     Jarel Cuadra 01/03/2024, 12:43 PM

## 2024-01-03 NOTE — TOC Transition Note (Addendum)
 Transition of Care [facility]) - Discharge Note   Patient Details  Name: Caitlyn [name] MRN: [id number] Date of Birth: [birth year]  Transition of Care [facility]) CM/SW Contact:  Heather DELENA Saltness, LCSW Phone Number: 01/03/2024, 9:45 AM   Clinical Narrative:    Pt to discharge to [facility] today. Pt accepted to room 131. D/C packet with required documents and signed prescriptions placed in pt's chart at RN station. [facility], TB test results, and diet order form faxed to [facility] at [phone]. IVC has been rescinded, uploaded to pt's chart. Pt to be transported by [employer]. Therapist, nutritional form signed by pt's sister, [name]. [facility] called at 12:20 PM. RN to call report to [phone]. Pt's sister, [name] [phone], made aware and in agreement with discharge plan. No further TOC needs at this time. TOC signing off.   Final next level of care: Skilled Nursing Facility Barriers to Discharge: Barriers Resolved   Patient Goals and CMS Choice Patient states their goals for this hospitalization and ongoing recovery are:: To go to [facility]   Choice offered to / list presented to : Sibling [place] ownership interest in [facility].provided to:: Sibling    Discharge Placement  [facility]              Patient to be transferred to facility by: [facility] Name of family member notified: [name], pt's sister Patient and family notified of of transfer: 01/03/24  Discharge Plan and Services Additional resources added to the After Visit Summary for  N/A In-house Referral: Clinical Social Work Discharge Planning Services: NA Post Acute Care Choice: Skilled Nursing Facility          DME Arranged: N/A DME Agency: NA       HH Arranged: NA HH Agency: NA        Social Drivers of Health (SDOH) Interventions SDOH Screenings   Food Insecurity: No Food Insecurity (12/20/2023)  Housing:  Low Risk  (12/20/2023)  Transportation Needs: No Transportation Needs (12/20/2023)  Utilities: Not At Risk (12/20/2023)  Social Connections: Moderately Integrated (12/20/2023)  Tobacco Use: Low Risk  (12/19/2023)     Readmission Risk Interventions    12/20/2023    4:07 PM  Readmission Risk Prevention Plan  Post Dischage Appt Complete  Medication Screening Complete  Transportation Screening Complete    Signed: Heather Saltness, MSW, LCSW Clinical Social Worker Inpatient Care Management 01/03/2024 12:28 PM

## 2024-01-03 NOTE — Plan of Care (Signed)
  Problem: Education: Goal: Knowledge of General Education information will improve Description: Including pain rating scale, medication(s)/side effects and non-pharmacologic comfort measures Outcome: Progressing   Problem: Health Behavior/Discharge Planning: Goal: Ability to manage health-related needs will improve Outcome: Progressing   Problem: Clinical Measurements: Goal: Ability to maintain clinical measurements within normal limits will improve Outcome: Progressing Goal: Will remain free from infection Outcome: Progressing Goal: Diagnostic test results will improve Outcome: Progressing Goal: Respiratory complications will improve Outcome: Progressing Goal: Cardiovascular complication will be avoided Outcome: Progressing   Problem: Coping: Goal: Level of anxiety will decrease Outcome: Progressing   Problem: Elimination: Goal: Will not experience complications related to bowel motility Outcome: Progressing Goal: Will not experience complications related to urinary retention Outcome: Progressing   Problem: Pain Managment: Goal: General experience of comfort will improve and/or be controlled Outcome: Progressing   Problem: Safety: Goal: Ability to remain free from injury will improve Outcome: Progressing   Problem: Skin Integrity: Goal: Risk for impaired skin integrity will decrease Outcome: Progressing

## 2024-01-03 NOTE — Progress Notes (Signed)
 Mobility Specialist - Progress Note   01/03/24 1023  Mobility  Activity Ambulated independently  Level of Assistance Independent  Assistive Device None  Distance Ambulated (ft) 500 ft  Range of Motion/Exercises Active  Activity Response Tolerated well  Mobility Referral Yes  Mobility visit 1 Mobility  Mobility Specialist Start Time (ACUTE ONLY) 1010  Mobility Specialist Stop Time (ACUTE ONLY) 1023  Mobility Specialist Time Calculation (min) (ACUTE ONLY) 13 min   Pt was found in bed and agreeable to ambulate. No complaints. Returned to room with all needs met. Sitter at bedside.  Erminio Leos,  Mobility Specialist Can be reached via Secure Chat

## 2024-01-03 NOTE — Discharge Summary (Signed)
 Physician Discharge Summary   Patient: Caitlyn [name] MRN: [id number] DOB: 10/27/1945  Admit date:     12/19/2023  Discharge date: 01/03/24  Discharge Physician: Garnette Pelt   PCP: Caitlyn [name] LABOR, MD   Recommendations at discharge:    Follow up with PCP in 1-2 weeks  Discharge Diagnoses: Principal Problem:   Acute metabolic encephalopathy Active Problems:   Major neurocognitive disorder (HCC)   Acute cystitis without hematuria   Delirium   Dementia with behavioral disturbance (HCC)  Resolved Problems:   * No resolved hospital problems. *  Hospital Course: 78 year old female with history of Alzheimer's dementia, coronary artery disease status post PCI, hypothyroidism, hypertension, hyperlipidemia, GERD, IDA, arthritis who presented for the evaluation of altered mental status. EMS was called by patient's neighbor after she was found standing at her neighbor's door undressed from waist down asking for underwear. Per Triage note, she was more confused than usual. On presentation, she was pleasantly confused. She was admitted for further evaluation of altered mental status, agitation. UA was suspicious for UTI. She repeatedly asked leave from the ED but due to unsafe disposition, psychiatry consulted, IVCed. TOC following and assisting with safe disposition. Patient is not stable to be without supervision due to her dementia.   Assessment and Plan: Acute metabolic encephalopathy/delirium/history of Alzheimer's dementia: Presented with worsening confusion from baseline per neighbors.  Could have been aggravated by UTI.  Patient lives alone with her dog. There is concern about patient's ability to take care of herself due to recent increased confusion from baseline, misplacing belongings, and neglecting finances.    - Evaluated by psychiatry-no need for inpatient psychiatric admission or initiation of any antipsychotic medication.  - Psychiatry specifically recommends to avoid  benzodiazepines and anticholinergics-reversible tests - Started on Seroquel  12.5 mg at bedtime which she appears to be tolerating well- is sleeping at night -Continues to have short term memory issues - TOC continues to follow for placement. Plan d/c to memory care unit   Suspected UTI: UA was suspicious for UTI. Started on cephalexin  on 12/19/2023-urine culture growing E. coli which is pansensitive - completed 7 day course of Cephalexin    Hypertension: BP remains stable.  Continue Coreg  and amlodipine      History of coronary artery disease: Status post PCI.   Currently on aspirin  -No chest pain or sob today   Hypokalemia: replaced    Consultants: Psychiatry Procedures performed:   Disposition: Memory Care  Diet recommendation:  Regular diet DISCHARGE MEDICATION: Allergies as of 01/03/2024       Reactions   Cortone Acetate [cortisone] Anaphylaxis   Crestor  [rosuvastatin  Calcium ] Other (See Comments)   Immediate memory loss lasting 3 months   Demerol [meperidine] Anaphylaxis, Nausea And Vomiting   Monosodium Glutamate Anaphylaxis   Morphine And Codeine Anaphylaxis, Nausea And Vomiting   Prednisone Anaphylaxis, Other (See Comments)   Confusion    Shellfish Allergy Anaphylaxis   Sulfa Antibiotics Anaphylaxis   Hctz [hydrochlorothiazide ] Other (See Comments)   Dizziness    Readi-cat [barium Sulfate] Diarrhea   Xanax [alprazolam] Other (See Comments)   Altered mental state   Ceftin [cefuroxime] Other (See Comments)   Dizziness    Flagyl [metronidazole] Other (See Comments)   Acute depression   Macrodantin [nitrofurantoin] Nausea Only, Other (See Comments)   GI upset   Praluent  [alirocumab ] Diarrhea   Repatha  [evolocumab ] Diarrhea   Wellbutrin [bupropion] Other (See Comments)   Agitation    Benzoic Acid Nausea Only   Butrans [buprenorphine] Nausea And Vomiting,  Palpitations, Other (See Comments), Hypertension   Tremors    Cipro [ciprofloxacin Hcl] Hives, Rash   Influenza  Vaccines Hives, Rash        Medication List     TAKE these medications    amLODipine  5 MG tablet Commonly known as: NORVASC  Take 1 tablet (5 mg total) by mouth daily.   aspirin  EC 81 MG tablet Take 1 tablet (81 mg total) by mouth daily.   carvedilol  6.25 MG tablet Commonly known as: COREG  Take 1 tablet (6.25 mg total) by mouth 2 (two) times daily with a meal.   QUEtiapine  25 MG tablet Commonly known as: SEROQUEL  Take 0.5 tablets (12.5 mg total) by mouth at bedtime.   senna-docusate 8.6-50 MG tablet Commonly known as: Senokot-S Take 1 tablet by mouth at bedtime as needed for mild constipation.        Follow-up Information     [facility] Follow up.   Specialty: Home Health Services Why: This provider will reach out to you within 24-48 hours after discharge to begin home health PT/OT services. Contact information: [address] [place] [address] [phone]                Discharge Exam: Caitlyn [name]   12/19/23 1703  Weight: 58.5 kg   General exam: Awake, laying in bed, in nad Respiratory system: Normal respiratory effort, no wheezing Cardiovascular system: regular rate, s1, s2 Gastrointestinal system: Soft, nondistended, positive BS Central nervous system: CN2-12 grossly intact, strength intact Extremities: Perfused, no clubbing Skin: Normal skin turgor, no notable skin lesions seen Psychiatry: Mood normal // no visual hallucinations   Condition at discharge: fair  The results of significant diagnostics from this hospitalization (including imaging, microbiology, ancillary and laboratory) are listed below for reference.   Imaging Studies: CT Head Wo Contrast Result Date: 12/19/2023 CLINICAL DATA:  Mental status change EXAM: CT HEAD WITHOUT CONTRAST TECHNIQUE: Contiguous axial images were obtained from the base of the skull through the vertex without intravenous contrast. RADIATION DOSE REDUCTION: This exam was  performed according to the departmental dose-optimization program which includes automated exposure control, adjustment of the mA and/or kV according to patient size and/or use of iterative reconstruction technique. COMPARISON:  MRI 11/23/2010, CT brain 11/23/2010 FINDINGS: Brain: No acute territorial infarction, hemorrhage or intracranial mass. Atrophy progression. Moderate chronic small vessel ischemic changes of the white matter. Prominent ventricles, slightly increased in likely due to atrophy progression. Interval chronic appearing small left ganglial capsular infarct. Vascular: No hyperdense vessels.  Carotid vascular calcification Skull: Normal. Negative for fracture or focal lesion. Sinuses/Orbits: No acute finding. Other: None IMPRESSION: 1. No CT evidence for acute intracranial abnormality. 2. Atrophy and chronic small vessel ischemic changes of the white matter. Interval chronic appearing small left gangliocapsular infarct. Electronically Signed   By: Luke Bun M.D.   On: 12/19/2023 18:00    Microbiology: Results for orders placed or performed during the hospital encounter of 12/19/23  Urine Culture     Status: Abnormal   Collection Time: 12/19/23  5:27 PM   Specimen: Urine, Clean Catch  Result Value Ref Range Status   Specimen Description   Final    URINE, CLEAN CATCH Performed at [facility], [address] [place], [place] [address]    Special Requests   Final    NONE Performed at [facility], [address], [place], [place] [address]    Culture >=100,000 COLONIES/mL ESCHERICHIA COLI (A)  Final  Report Status 12/22/2023 FINAL  Final   Organism ID, Bacteria ESCHERICHIA COLI (A)  Final      Susceptibility   Escherichia coli - MIC*    AMPICILLIN <=2 SENSITIVE Sensitive     CEFAZOLIN  Value in next row Sensitive      2 SENSITIVEThis is a modified FDA-approved test that has been validated and its performance characteristics determined by  the reporting laboratory.  This laboratory is certified under the Clinical Laboratory Improvement Amendments CLIA as qualified to perform high complexity clinical laboratory testing.    CEFEPIME  Value in next row Sensitive      2 SENSITIVEThis is a modified FDA-approved test that has been validated and its performance characteristics determined by the reporting laboratory.  This laboratory is certified under the Clinical Laboratory Improvement Amendments CLIA as qualified to perform high complexity clinical laboratory testing.    ERTAPENEM Value in next row Sensitive      2 SENSITIVEThis is a modified FDA-approved test that has been validated and its performance characteristics determined by the reporting laboratory.  This laboratory is certified under the Clinical Laboratory Improvement Amendments CLIA as qualified to perform high complexity clinical laboratory testing.    CEFTRIAXONE  Value in next row Sensitive      2 SENSITIVEThis is a modified FDA-approved test that has been validated and its performance characteristics determined by the reporting laboratory.  This laboratory is certified under the Clinical Laboratory Improvement Amendments CLIA as qualified to perform high complexity clinical laboratory testing.    CIPROFLOXACIN Value in next row Sensitive      2 SENSITIVEThis is a modified FDA-approved test that has been validated and its performance characteristics determined by the reporting laboratory.  This laboratory is certified under the Clinical Laboratory Improvement Amendments CLIA as qualified to perform high complexity clinical laboratory testing.    GENTAMICIN Value in next row Sensitive      2 SENSITIVEThis is a modified FDA-approved test that has been validated and its performance characteristics determined by the reporting laboratory.  This laboratory is certified under the Clinical Laboratory Improvement Amendments CLIA as qualified to perform high complexity clinical laboratory  testing.    NITROFURANTOIN Value in next row Sensitive      2 SENSITIVEThis is a modified FDA-approved test that has been validated and its performance characteristics determined by the reporting laboratory.  This laboratory is certified under the Clinical Laboratory Improvement Amendments CLIA as qualified to perform high complexity clinical laboratory testing.    TRIMETH/SULFA Value in next row Sensitive      2 SENSITIVEThis is a modified FDA-approved test that has been validated and its performance characteristics determined by the reporting laboratory.  This laboratory is certified under the Clinical Laboratory Improvement Amendments CLIA as qualified to perform high complexity clinical laboratory testing.    AMPICILLIN/SULBACTAM Value in next row Sensitive      2 SENSITIVEThis is a modified FDA-approved test that has been validated and its performance characteristics determined by the reporting laboratory.  This laboratory is certified under the Clinical Laboratory Improvement Amendments CLIA as qualified to perform high complexity clinical laboratory testing.    PIP/TAZO Value in next row Sensitive ug/mL     <=4 SENSITIVEThis is a modified FDA-approved test that has been validated and its performance characteristics determined by the reporting laboratory.  This laboratory is certified under the Clinical Laboratory Improvement Amendments CLIA as qualified to perform high complexity clinical laboratory testing.    MEROPENEM Value in next row Sensitive      <=  4 SENSITIVEThis is a modified FDA-approved test that has been validated and its performance characteristics determined by the reporting laboratory.  This laboratory is certified under the Clinical Laboratory Improvement Amendments CLIA as qualified to perform high complexity clinical laboratory testing.    * >=100,000 COLONIES/mL ESCHERICHIA COLI    Labs: CBC: Recent Labs  Lab 12/30/23 0559  WBC 6.3  HGB 12.0  HCT 38.0  MCV 92.2  PLT  187   Basic Metabolic Panel: Recent Labs  Lab 12/30/23 0559  NA 139  K 3.8  CL 108  CO2 24  GLUCOSE 107*  BUN 17  CREATININE 0.56  CALCIUM  8.7*   Liver Function Tests: Recent Labs  Lab 12/30/23 0559  AST 18  ALT 17  ALKPHOS 61  BILITOT 0.4  PROT 5.7*  ALBUMIN 3.0*   CBG: No results for input(s): GLUCAP in the last 168 hours.  Discharge time spent: less than 30 minutes.  Signed: Garnette Pelt, MD [facility] 01/03/2024

## 2024-01-03 NOTE — Progress Notes (Signed)
 Discharge meds in a secure bag delivered to pt room at 1250- pt had just left with [facility]. Call by [facility] staff to return to [facility] to pick up meds- This RN waited at main entrance for 15 minutes- meds returned to [facility] [place] nurse Randa GRADE. ). [facility] returned in the afternoon to pick up medication with patient in car. Meds delivered in secure bag to [facility] by [employer].

## 2024-01-03 NOTE — Progress Notes (Signed)
 RN attempted to call report 5 times with the number provided by case manager, there was no response.

## 2024-01-05 DIAGNOSIS — G309 Alzheimer's disease, unspecified: Secondary | ICD-10-CM | POA: Diagnosis not present

## 2024-01-05 DIAGNOSIS — I2510 Atherosclerotic heart disease of native coronary artery without angina pectoris: Secondary | ICD-10-CM | POA: Diagnosis not present

## 2024-01-05 DIAGNOSIS — M1990 Unspecified osteoarthritis, unspecified site: Secondary | ICD-10-CM | POA: Diagnosis not present

## 2024-01-05 DIAGNOSIS — I10 Essential (primary) hypertension: Secondary | ICD-10-CM | POA: Diagnosis not present

## 2024-01-05 DIAGNOSIS — E039 Hypothyroidism, unspecified: Secondary | ICD-10-CM | POA: Diagnosis not present

## 2024-01-05 DIAGNOSIS — Z9181 History of falling: Secondary | ICD-10-CM | POA: Diagnosis not present

## 2024-01-05 DIAGNOSIS — F05 Delirium due to known physiological condition: Secondary | ICD-10-CM | POA: Diagnosis not present

## 2024-01-05 DIAGNOSIS — B9620 Unspecified Escherichia coli [E. coli] as the cause of diseases classified elsewhere: Secondary | ICD-10-CM | POA: Diagnosis not present

## 2024-01-05 DIAGNOSIS — D509 Iron deficiency anemia, unspecified: Secondary | ICD-10-CM | POA: Diagnosis not present

## 2024-01-05 DIAGNOSIS — K219 Gastro-esophageal reflux disease without esophagitis: Secondary | ICD-10-CM | POA: Diagnosis not present

## 2024-01-05 DIAGNOSIS — Z7982 Long term (current) use of aspirin: Secondary | ICD-10-CM | POA: Diagnosis not present

## 2024-01-05 DIAGNOSIS — N3000 Acute cystitis without hematuria: Secondary | ICD-10-CM | POA: Diagnosis not present

## 2024-01-05 DIAGNOSIS — E785 Hyperlipidemia, unspecified: Secondary | ICD-10-CM | POA: Diagnosis not present

## 2024-01-05 DIAGNOSIS — F02811 Dementia in other diseases classified elsewhere, unspecified severity, with agitation: Secondary | ICD-10-CM | POA: Diagnosis not present

## 2024-01-05 DIAGNOSIS — F02818 Dementia in other diseases classified elsewhere, unspecified severity, with other behavioral disturbance: Secondary | ICD-10-CM | POA: Diagnosis not present

## 2024-01-05 DIAGNOSIS — G9341 Metabolic encephalopathy: Secondary | ICD-10-CM | POA: Diagnosis not present

## 2024-01-08 DIAGNOSIS — K219 Gastro-esophageal reflux disease without esophagitis: Secondary | ICD-10-CM | POA: Diagnosis not present

## 2024-01-08 DIAGNOSIS — I2510 Atherosclerotic heart disease of native coronary artery without angina pectoris: Secondary | ICD-10-CM | POA: Diagnosis not present

## 2024-01-08 DIAGNOSIS — I10 Essential (primary) hypertension: Secondary | ICD-10-CM | POA: Diagnosis not present

## 2024-01-08 DIAGNOSIS — D649 Anemia, unspecified: Secondary | ICD-10-CM | POA: Diagnosis not present

## 2024-01-09 DIAGNOSIS — F4325 Adjustment disorder with mixed disturbance of emotions and conduct: Secondary | ICD-10-CM | POA: Diagnosis not present

## 2024-01-11 DIAGNOSIS — F05 Delirium due to known physiological condition: Secondary | ICD-10-CM | POA: Diagnosis not present

## 2024-01-11 DIAGNOSIS — B9620 Unspecified Escherichia coli [E. coli] as the cause of diseases classified elsewhere: Secondary | ICD-10-CM | POA: Diagnosis not present

## 2024-01-11 DIAGNOSIS — Z9181 History of falling: Secondary | ICD-10-CM | POA: Diagnosis not present

## 2024-01-11 DIAGNOSIS — D509 Iron deficiency anemia, unspecified: Secondary | ICD-10-CM | POA: Diagnosis not present

## 2024-01-11 DIAGNOSIS — I2510 Atherosclerotic heart disease of native coronary artery without angina pectoris: Secondary | ICD-10-CM | POA: Diagnosis not present

## 2024-01-11 DIAGNOSIS — I10 Essential (primary) hypertension: Secondary | ICD-10-CM | POA: Diagnosis not present

## 2024-01-11 DIAGNOSIS — N3000 Acute cystitis without hematuria: Secondary | ICD-10-CM | POA: Diagnosis not present

## 2024-01-11 DIAGNOSIS — E039 Hypothyroidism, unspecified: Secondary | ICD-10-CM | POA: Diagnosis not present

## 2024-01-11 DIAGNOSIS — E785 Hyperlipidemia, unspecified: Secondary | ICD-10-CM | POA: Diagnosis not present

## 2024-01-11 DIAGNOSIS — K219 Gastro-esophageal reflux disease without esophagitis: Secondary | ICD-10-CM | POA: Diagnosis not present

## 2024-01-11 DIAGNOSIS — F02811 Dementia in other diseases classified elsewhere, unspecified severity, with agitation: Secondary | ICD-10-CM | POA: Diagnosis not present

## 2024-01-11 DIAGNOSIS — G309 Alzheimer's disease, unspecified: Secondary | ICD-10-CM | POA: Diagnosis not present

## 2024-01-11 DIAGNOSIS — M1990 Unspecified osteoarthritis, unspecified site: Secondary | ICD-10-CM | POA: Diagnosis not present

## 2024-01-11 DIAGNOSIS — F02818 Dementia in other diseases classified elsewhere, unspecified severity, with other behavioral disturbance: Secondary | ICD-10-CM | POA: Diagnosis not present

## 2024-01-11 DIAGNOSIS — G9341 Metabolic encephalopathy: Secondary | ICD-10-CM | POA: Diagnosis not present

## 2024-01-11 DIAGNOSIS — Z7982 Long term (current) use of aspirin: Secondary | ICD-10-CM | POA: Diagnosis not present

## 2024-01-14 DIAGNOSIS — K219 Gastro-esophageal reflux disease without esophagitis: Secondary | ICD-10-CM | POA: Diagnosis not present

## 2024-01-14 DIAGNOSIS — F02811 Dementia in other diseases classified elsewhere, unspecified severity, with agitation: Secondary | ICD-10-CM | POA: Diagnosis not present

## 2024-01-14 DIAGNOSIS — Z9181 History of falling: Secondary | ICD-10-CM | POA: Diagnosis not present

## 2024-01-14 DIAGNOSIS — M1990 Unspecified osteoarthritis, unspecified site: Secondary | ICD-10-CM | POA: Diagnosis not present

## 2024-01-14 DIAGNOSIS — N3000 Acute cystitis without hematuria: Secondary | ICD-10-CM | POA: Diagnosis not present

## 2024-01-14 DIAGNOSIS — E039 Hypothyroidism, unspecified: Secondary | ICD-10-CM | POA: Diagnosis not present

## 2024-01-14 DIAGNOSIS — F02818 Dementia in other diseases classified elsewhere, unspecified severity, with other behavioral disturbance: Secondary | ICD-10-CM | POA: Diagnosis not present

## 2024-01-14 DIAGNOSIS — G309 Alzheimer's disease, unspecified: Secondary | ICD-10-CM | POA: Diagnosis not present

## 2024-01-14 DIAGNOSIS — F05 Delirium due to known physiological condition: Secondary | ICD-10-CM | POA: Diagnosis not present

## 2024-01-14 DIAGNOSIS — I2510 Atherosclerotic heart disease of native coronary artery without angina pectoris: Secondary | ICD-10-CM | POA: Diagnosis not present

## 2024-01-14 DIAGNOSIS — I10 Essential (primary) hypertension: Secondary | ICD-10-CM | POA: Diagnosis not present

## 2024-01-14 DIAGNOSIS — E785 Hyperlipidemia, unspecified: Secondary | ICD-10-CM | POA: Diagnosis not present

## 2024-01-14 DIAGNOSIS — G9341 Metabolic encephalopathy: Secondary | ICD-10-CM | POA: Diagnosis not present

## 2024-01-14 DIAGNOSIS — D509 Iron deficiency anemia, unspecified: Secondary | ICD-10-CM | POA: Diagnosis not present

## 2024-01-14 DIAGNOSIS — B9620 Unspecified Escherichia coli [E. coli] as the cause of diseases classified elsewhere: Secondary | ICD-10-CM | POA: Diagnosis not present

## 2024-01-14 DIAGNOSIS — Z7982 Long term (current) use of aspirin: Secondary | ICD-10-CM | POA: Diagnosis not present

## 2024-01-18 DIAGNOSIS — K219 Gastro-esophageal reflux disease without esophagitis: Secondary | ICD-10-CM | POA: Diagnosis not present

## 2024-01-18 DIAGNOSIS — E039 Hypothyroidism, unspecified: Secondary | ICD-10-CM | POA: Diagnosis not present

## 2024-01-18 DIAGNOSIS — Z7982 Long term (current) use of aspirin: Secondary | ICD-10-CM | POA: Diagnosis not present

## 2024-01-18 DIAGNOSIS — E785 Hyperlipidemia, unspecified: Secondary | ICD-10-CM | POA: Diagnosis not present

## 2024-01-18 DIAGNOSIS — Z9181 History of falling: Secondary | ICD-10-CM | POA: Diagnosis not present

## 2024-01-18 DIAGNOSIS — I10 Essential (primary) hypertension: Secondary | ICD-10-CM | POA: Diagnosis not present

## 2024-01-18 DIAGNOSIS — N3000 Acute cystitis without hematuria: Secondary | ICD-10-CM | POA: Diagnosis not present

## 2024-01-18 DIAGNOSIS — B9620 Unspecified Escherichia coli [E. coli] as the cause of diseases classified elsewhere: Secondary | ICD-10-CM | POA: Diagnosis not present

## 2024-01-18 DIAGNOSIS — F02811 Dementia in other diseases classified elsewhere, unspecified severity, with agitation: Secondary | ICD-10-CM | POA: Diagnosis not present

## 2024-01-18 DIAGNOSIS — G309 Alzheimer's disease, unspecified: Secondary | ICD-10-CM | POA: Diagnosis not present

## 2024-01-18 DIAGNOSIS — G9341 Metabolic encephalopathy: Secondary | ICD-10-CM | POA: Diagnosis not present

## 2024-01-18 DIAGNOSIS — D509 Iron deficiency anemia, unspecified: Secondary | ICD-10-CM | POA: Diagnosis not present

## 2024-01-18 DIAGNOSIS — I2510 Atherosclerotic heart disease of native coronary artery without angina pectoris: Secondary | ICD-10-CM | POA: Diagnosis not present

## 2024-01-18 DIAGNOSIS — F02818 Dementia in other diseases classified elsewhere, unspecified severity, with other behavioral disturbance: Secondary | ICD-10-CM | POA: Diagnosis not present

## 2024-01-18 DIAGNOSIS — M1990 Unspecified osteoarthritis, unspecified site: Secondary | ICD-10-CM | POA: Diagnosis not present

## 2024-01-18 DIAGNOSIS — F05 Delirium due to known physiological condition: Secondary | ICD-10-CM | POA: Diagnosis not present

## 2024-01-20 DIAGNOSIS — I10 Essential (primary) hypertension: Secondary | ICD-10-CM | POA: Diagnosis not present

## 2024-01-20 DIAGNOSIS — E038 Other specified hypothyroidism: Secondary | ICD-10-CM | POA: Diagnosis not present

## 2024-01-20 DIAGNOSIS — G309 Alzheimer's disease, unspecified: Secondary | ICD-10-CM | POA: Diagnosis not present

## 2024-01-20 DIAGNOSIS — I2510 Atherosclerotic heart disease of native coronary artery without angina pectoris: Secondary | ICD-10-CM | POA: Diagnosis not present

## 2024-01-21 DIAGNOSIS — Z7982 Long term (current) use of aspirin: Secondary | ICD-10-CM | POA: Diagnosis not present

## 2024-01-21 DIAGNOSIS — F02811 Dementia in other diseases classified elsewhere, unspecified severity, with agitation: Secondary | ICD-10-CM | POA: Diagnosis not present

## 2024-01-21 DIAGNOSIS — I10 Essential (primary) hypertension: Secondary | ICD-10-CM | POA: Diagnosis not present

## 2024-01-21 DIAGNOSIS — Z9181 History of falling: Secondary | ICD-10-CM | POA: Diagnosis not present

## 2024-01-21 DIAGNOSIS — D509 Iron deficiency anemia, unspecified: Secondary | ICD-10-CM | POA: Diagnosis not present

## 2024-01-21 DIAGNOSIS — M1990 Unspecified osteoarthritis, unspecified site: Secondary | ICD-10-CM | POA: Diagnosis not present

## 2024-01-21 DIAGNOSIS — F02818 Dementia in other diseases classified elsewhere, unspecified severity, with other behavioral disturbance: Secondary | ICD-10-CM | POA: Diagnosis not present

## 2024-01-21 DIAGNOSIS — I2510 Atherosclerotic heart disease of native coronary artery without angina pectoris: Secondary | ICD-10-CM | POA: Diagnosis not present

## 2024-01-21 DIAGNOSIS — N3000 Acute cystitis without hematuria: Secondary | ICD-10-CM | POA: Diagnosis not present

## 2024-01-21 DIAGNOSIS — F05 Delirium due to known physiological condition: Secondary | ICD-10-CM | POA: Diagnosis not present

## 2024-01-21 DIAGNOSIS — K219 Gastro-esophageal reflux disease without esophagitis: Secondary | ICD-10-CM | POA: Diagnosis not present

## 2024-01-21 DIAGNOSIS — G309 Alzheimer's disease, unspecified: Secondary | ICD-10-CM | POA: Diagnosis not present

## 2024-01-21 DIAGNOSIS — G9341 Metabolic encephalopathy: Secondary | ICD-10-CM | POA: Diagnosis not present

## 2024-01-21 DIAGNOSIS — E785 Hyperlipidemia, unspecified: Secondary | ICD-10-CM | POA: Diagnosis not present

## 2024-01-21 DIAGNOSIS — E039 Hypothyroidism, unspecified: Secondary | ICD-10-CM | POA: Diagnosis not present

## 2024-01-21 DIAGNOSIS — B9620 Unspecified Escherichia coli [E. coli] as the cause of diseases classified elsewhere: Secondary | ICD-10-CM | POA: Diagnosis not present

## 2024-01-24 DIAGNOSIS — G9341 Metabolic encephalopathy: Secondary | ICD-10-CM | POA: Diagnosis not present

## 2024-01-24 DIAGNOSIS — N3000 Acute cystitis without hematuria: Secondary | ICD-10-CM | POA: Diagnosis not present

## 2024-01-24 DIAGNOSIS — Z7982 Long term (current) use of aspirin: Secondary | ICD-10-CM | POA: Diagnosis not present

## 2024-01-24 DIAGNOSIS — I2510 Atherosclerotic heart disease of native coronary artery without angina pectoris: Secondary | ICD-10-CM | POA: Diagnosis not present

## 2024-01-24 DIAGNOSIS — F02811 Dementia in other diseases classified elsewhere, unspecified severity, with agitation: Secondary | ICD-10-CM | POA: Diagnosis not present

## 2024-01-24 DIAGNOSIS — K219 Gastro-esophageal reflux disease without esophagitis: Secondary | ICD-10-CM | POA: Diagnosis not present

## 2024-01-24 DIAGNOSIS — I10 Essential (primary) hypertension: Secondary | ICD-10-CM | POA: Diagnosis not present

## 2024-01-24 DIAGNOSIS — E785 Hyperlipidemia, unspecified: Secondary | ICD-10-CM | POA: Diagnosis not present

## 2024-01-24 DIAGNOSIS — B9620 Unspecified Escherichia coli [E. coli] as the cause of diseases classified elsewhere: Secondary | ICD-10-CM | POA: Diagnosis not present

## 2024-01-24 DIAGNOSIS — F02818 Dementia in other diseases classified elsewhere, unspecified severity, with other behavioral disturbance: Secondary | ICD-10-CM | POA: Diagnosis not present

## 2024-01-24 DIAGNOSIS — G309 Alzheimer's disease, unspecified: Secondary | ICD-10-CM | POA: Diagnosis not present

## 2024-01-24 DIAGNOSIS — E039 Hypothyroidism, unspecified: Secondary | ICD-10-CM | POA: Diagnosis not present

## 2024-01-24 DIAGNOSIS — Z9181 History of falling: Secondary | ICD-10-CM | POA: Diagnosis not present

## 2024-01-24 DIAGNOSIS — M1990 Unspecified osteoarthritis, unspecified site: Secondary | ICD-10-CM | POA: Diagnosis not present

## 2024-01-24 DIAGNOSIS — F05 Delirium due to known physiological condition: Secondary | ICD-10-CM | POA: Diagnosis not present

## 2024-01-24 DIAGNOSIS — D509 Iron deficiency anemia, unspecified: Secondary | ICD-10-CM | POA: Diagnosis not present

## 2024-01-25 DIAGNOSIS — I200 Unstable angina: Secondary | ICD-10-CM | POA: Diagnosis not present

## 2024-01-25 DIAGNOSIS — G9341 Metabolic encephalopathy: Secondary | ICD-10-CM | POA: Diagnosis not present

## 2024-01-27 DIAGNOSIS — E785 Hyperlipidemia, unspecified: Secondary | ICD-10-CM | POA: Diagnosis not present

## 2024-01-27 DIAGNOSIS — I2510 Atherosclerotic heart disease of native coronary artery without angina pectoris: Secondary | ICD-10-CM | POA: Diagnosis not present

## 2024-01-27 DIAGNOSIS — F02811 Dementia in other diseases classified elsewhere, unspecified severity, with agitation: Secondary | ICD-10-CM | POA: Diagnosis not present

## 2024-01-27 DIAGNOSIS — Z9181 History of falling: Secondary | ICD-10-CM | POA: Diagnosis not present

## 2024-01-27 DIAGNOSIS — M1990 Unspecified osteoarthritis, unspecified site: Secondary | ICD-10-CM | POA: Diagnosis not present

## 2024-01-27 DIAGNOSIS — Z7982 Long term (current) use of aspirin: Secondary | ICD-10-CM | POA: Diagnosis not present

## 2024-01-27 DIAGNOSIS — G9341 Metabolic encephalopathy: Secondary | ICD-10-CM | POA: Diagnosis not present

## 2024-01-27 DIAGNOSIS — D509 Iron deficiency anemia, unspecified: Secondary | ICD-10-CM | POA: Diagnosis not present

## 2024-01-27 DIAGNOSIS — E039 Hypothyroidism, unspecified: Secondary | ICD-10-CM | POA: Diagnosis not present

## 2024-01-27 DIAGNOSIS — N3000 Acute cystitis without hematuria: Secondary | ICD-10-CM | POA: Diagnosis not present

## 2024-01-27 DIAGNOSIS — K219 Gastro-esophageal reflux disease without esophagitis: Secondary | ICD-10-CM | POA: Diagnosis not present

## 2024-01-27 DIAGNOSIS — F05 Delirium due to known physiological condition: Secondary | ICD-10-CM | POA: Diagnosis not present

## 2024-01-27 DIAGNOSIS — F02818 Dementia in other diseases classified elsewhere, unspecified severity, with other behavioral disturbance: Secondary | ICD-10-CM | POA: Diagnosis not present

## 2024-01-27 DIAGNOSIS — G309 Alzheimer's disease, unspecified: Secondary | ICD-10-CM | POA: Diagnosis not present

## 2024-01-27 DIAGNOSIS — B9620 Unspecified Escherichia coli [E. coli] as the cause of diseases classified elsewhere: Secondary | ICD-10-CM | POA: Diagnosis not present

## 2024-01-27 DIAGNOSIS — I10 Essential (primary) hypertension: Secondary | ICD-10-CM | POA: Diagnosis not present

## 2024-01-31 DIAGNOSIS — F02811 Dementia in other diseases classified elsewhere, unspecified severity, with agitation: Secondary | ICD-10-CM | POA: Diagnosis not present

## 2024-01-31 DIAGNOSIS — Z9181 History of falling: Secondary | ICD-10-CM | POA: Diagnosis not present

## 2024-01-31 DIAGNOSIS — N3000 Acute cystitis without hematuria: Secondary | ICD-10-CM | POA: Diagnosis not present

## 2024-01-31 DIAGNOSIS — K219 Gastro-esophageal reflux disease without esophagitis: Secondary | ICD-10-CM | POA: Diagnosis not present

## 2024-01-31 DIAGNOSIS — F05 Delirium due to known physiological condition: Secondary | ICD-10-CM | POA: Diagnosis not present

## 2024-01-31 DIAGNOSIS — I2510 Atherosclerotic heart disease of native coronary artery without angina pectoris: Secondary | ICD-10-CM | POA: Diagnosis not present

## 2024-01-31 DIAGNOSIS — I10 Essential (primary) hypertension: Secondary | ICD-10-CM | POA: Diagnosis not present

## 2024-01-31 DIAGNOSIS — G309 Alzheimer's disease, unspecified: Secondary | ICD-10-CM | POA: Diagnosis not present

## 2024-01-31 DIAGNOSIS — F02818 Dementia in other diseases classified elsewhere, unspecified severity, with other behavioral disturbance: Secondary | ICD-10-CM | POA: Diagnosis not present

## 2024-01-31 DIAGNOSIS — Z7982 Long term (current) use of aspirin: Secondary | ICD-10-CM | POA: Diagnosis not present

## 2024-01-31 DIAGNOSIS — E785 Hyperlipidemia, unspecified: Secondary | ICD-10-CM | POA: Diagnosis not present

## 2024-01-31 DIAGNOSIS — M1990 Unspecified osteoarthritis, unspecified site: Secondary | ICD-10-CM | POA: Diagnosis not present

## 2024-01-31 DIAGNOSIS — B9620 Unspecified Escherichia coli [E. coli] as the cause of diseases classified elsewhere: Secondary | ICD-10-CM | POA: Diagnosis not present

## 2024-01-31 DIAGNOSIS — G9341 Metabolic encephalopathy: Secondary | ICD-10-CM | POA: Diagnosis not present

## 2024-01-31 DIAGNOSIS — E039 Hypothyroidism, unspecified: Secondary | ICD-10-CM | POA: Diagnosis not present

## 2024-01-31 DIAGNOSIS — D509 Iron deficiency anemia, unspecified: Secondary | ICD-10-CM | POA: Diagnosis not present

## 2024-02-03 DIAGNOSIS — I10 Essential (primary) hypertension: Secondary | ICD-10-CM | POA: Diagnosis not present

## 2024-02-03 DIAGNOSIS — E785 Hyperlipidemia, unspecified: Secondary | ICD-10-CM | POA: Diagnosis not present

## 2024-02-03 DIAGNOSIS — E038 Other specified hypothyroidism: Secondary | ICD-10-CM | POA: Diagnosis not present

## 2024-02-03 DIAGNOSIS — I2510 Atherosclerotic heart disease of native coronary artery without angina pectoris: Secondary | ICD-10-CM | POA: Diagnosis not present

## 2024-02-17 DIAGNOSIS — D519 Vitamin B12 deficiency anemia, unspecified: Secondary | ICD-10-CM | POA: Diagnosis not present

## 2024-02-17 DIAGNOSIS — D508 Other iron deficiency anemias: Secondary | ICD-10-CM | POA: Diagnosis not present

## 2024-02-17 DIAGNOSIS — R7309 Other abnormal glucose: Secondary | ICD-10-CM | POA: Diagnosis not present

## 2024-02-17 DIAGNOSIS — Z79899 Other long term (current) drug therapy: Secondary | ICD-10-CM | POA: Diagnosis not present

## 2024-02-17 DIAGNOSIS — I10 Essential (primary) hypertension: Secondary | ICD-10-CM | POA: Diagnosis not present

## 2024-02-17 DIAGNOSIS — E782 Mixed hyperlipidemia: Secondary | ICD-10-CM | POA: Diagnosis not present

## 2024-02-17 DIAGNOSIS — E559 Vitamin D deficiency, unspecified: Secondary | ICD-10-CM | POA: Diagnosis not present

## 2024-02-17 DIAGNOSIS — E785 Hyperlipidemia, unspecified: Secondary | ICD-10-CM | POA: Diagnosis not present

## 2024-02-17 DIAGNOSIS — E038 Other specified hypothyroidism: Secondary | ICD-10-CM | POA: Diagnosis not present

## 2024-02-18 DIAGNOSIS — E039 Hypothyroidism, unspecified: Secondary | ICD-10-CM | POA: Diagnosis not present

## 2024-02-18 DIAGNOSIS — G9341 Metabolic encephalopathy: Secondary | ICD-10-CM | POA: Diagnosis not present

## 2024-02-18 DIAGNOSIS — E785 Hyperlipidemia, unspecified: Secondary | ICD-10-CM | POA: Diagnosis not present

## 2024-02-18 DIAGNOSIS — M1990 Unspecified osteoarthritis, unspecified site: Secondary | ICD-10-CM | POA: Diagnosis not present

## 2024-02-18 DIAGNOSIS — F02818 Dementia in other diseases classified elsewhere, unspecified severity, with other behavioral disturbance: Secondary | ICD-10-CM | POA: Diagnosis not present

## 2024-02-18 DIAGNOSIS — G309 Alzheimer's disease, unspecified: Secondary | ICD-10-CM | POA: Diagnosis not present

## 2024-02-18 DIAGNOSIS — K219 Gastro-esophageal reflux disease without esophagitis: Secondary | ICD-10-CM | POA: Diagnosis not present

## 2024-02-18 DIAGNOSIS — Z9181 History of falling: Secondary | ICD-10-CM | POA: Diagnosis not present

## 2024-02-18 DIAGNOSIS — F02811 Dementia in other diseases classified elsewhere, unspecified severity, with agitation: Secondary | ICD-10-CM | POA: Diagnosis not present

## 2024-02-18 DIAGNOSIS — I10 Essential (primary) hypertension: Secondary | ICD-10-CM | POA: Diagnosis not present

## 2024-02-18 DIAGNOSIS — I2510 Atherosclerotic heart disease of native coronary artery without angina pectoris: Secondary | ICD-10-CM | POA: Diagnosis not present

## 2024-02-18 DIAGNOSIS — B9620 Unspecified Escherichia coli [E. coli] as the cause of diseases classified elsewhere: Secondary | ICD-10-CM | POA: Diagnosis not present

## 2024-02-18 DIAGNOSIS — D509 Iron deficiency anemia, unspecified: Secondary | ICD-10-CM | POA: Diagnosis not present

## 2024-02-18 DIAGNOSIS — Z7982 Long term (current) use of aspirin: Secondary | ICD-10-CM | POA: Diagnosis not present

## 2024-02-18 DIAGNOSIS — D649 Anemia, unspecified: Secondary | ICD-10-CM | POA: Diagnosis not present

## 2024-02-18 DIAGNOSIS — N3000 Acute cystitis without hematuria: Secondary | ICD-10-CM | POA: Diagnosis not present

## 2024-02-18 DIAGNOSIS — F05 Delirium due to known physiological condition: Secondary | ICD-10-CM | POA: Diagnosis not present

## 2024-02-25 DIAGNOSIS — N3000 Acute cystitis without hematuria: Secondary | ICD-10-CM | POA: Diagnosis not present

## 2024-02-25 DIAGNOSIS — B9620 Unspecified Escherichia coli [E. coli] as the cause of diseases classified elsewhere: Secondary | ICD-10-CM | POA: Diagnosis not present

## 2024-02-25 DIAGNOSIS — F05 Delirium due to known physiological condition: Secondary | ICD-10-CM | POA: Diagnosis not present

## 2024-02-25 DIAGNOSIS — K219 Gastro-esophageal reflux disease without esophagitis: Secondary | ICD-10-CM | POA: Diagnosis not present

## 2024-02-25 DIAGNOSIS — E785 Hyperlipidemia, unspecified: Secondary | ICD-10-CM | POA: Diagnosis not present

## 2024-02-25 DIAGNOSIS — I2510 Atherosclerotic heart disease of native coronary artery without angina pectoris: Secondary | ICD-10-CM | POA: Diagnosis not present

## 2024-02-25 DIAGNOSIS — F02818 Dementia in other diseases classified elsewhere, unspecified severity, with other behavioral disturbance: Secondary | ICD-10-CM | POA: Diagnosis not present

## 2024-02-25 DIAGNOSIS — M1990 Unspecified osteoarthritis, unspecified site: Secondary | ICD-10-CM | POA: Diagnosis not present

## 2024-02-25 DIAGNOSIS — Z7982 Long term (current) use of aspirin: Secondary | ICD-10-CM | POA: Diagnosis not present

## 2024-02-25 DIAGNOSIS — E039 Hypothyroidism, unspecified: Secondary | ICD-10-CM | POA: Diagnosis not present

## 2024-02-25 DIAGNOSIS — G309 Alzheimer's disease, unspecified: Secondary | ICD-10-CM | POA: Diagnosis not present

## 2024-02-25 DIAGNOSIS — G9341 Metabolic encephalopathy: Secondary | ICD-10-CM | POA: Diagnosis not present

## 2024-02-25 DIAGNOSIS — F02811 Dementia in other diseases classified elsewhere, unspecified severity, with agitation: Secondary | ICD-10-CM | POA: Diagnosis not present

## 2024-02-25 DIAGNOSIS — D509 Iron deficiency anemia, unspecified: Secondary | ICD-10-CM | POA: Diagnosis not present

## 2024-02-25 DIAGNOSIS — I10 Essential (primary) hypertension: Secondary | ICD-10-CM | POA: Diagnosis not present

## 2024-02-25 DIAGNOSIS — Z9181 History of falling: Secondary | ICD-10-CM | POA: Diagnosis not present

## 2024-03-02 DIAGNOSIS — I10 Essential (primary) hypertension: Secondary | ICD-10-CM | POA: Diagnosis not present

## 2024-03-02 DIAGNOSIS — Z7982 Long term (current) use of aspirin: Secondary | ICD-10-CM | POA: Diagnosis not present

## 2024-03-02 DIAGNOSIS — M1990 Unspecified osteoarthritis, unspecified site: Secondary | ICD-10-CM | POA: Diagnosis not present

## 2024-03-02 DIAGNOSIS — K219 Gastro-esophageal reflux disease without esophagitis: Secondary | ICD-10-CM | POA: Diagnosis not present

## 2024-03-02 DIAGNOSIS — H6120 Impacted cerumen, unspecified ear: Secondary | ICD-10-CM | POA: Diagnosis not present

## 2024-03-02 DIAGNOSIS — I2510 Atherosclerotic heart disease of native coronary artery without angina pectoris: Secondary | ICD-10-CM | POA: Diagnosis not present

## 2024-03-02 DIAGNOSIS — D509 Iron deficiency anemia, unspecified: Secondary | ICD-10-CM | POA: Diagnosis not present

## 2024-03-02 DIAGNOSIS — N3000 Acute cystitis without hematuria: Secondary | ICD-10-CM | POA: Diagnosis not present

## 2024-03-02 DIAGNOSIS — E039 Hypothyroidism, unspecified: Secondary | ICD-10-CM | POA: Diagnosis not present

## 2024-03-02 DIAGNOSIS — G9341 Metabolic encephalopathy: Secondary | ICD-10-CM | POA: Diagnosis not present

## 2024-03-02 DIAGNOSIS — F02811 Dementia in other diseases classified elsewhere, unspecified severity, with agitation: Secondary | ICD-10-CM | POA: Diagnosis not present

## 2024-03-02 DIAGNOSIS — B9620 Unspecified Escherichia coli [E. coli] as the cause of diseases classified elsewhere: Secondary | ICD-10-CM | POA: Diagnosis not present

## 2024-03-02 DIAGNOSIS — G309 Alzheimer's disease, unspecified: Secondary | ICD-10-CM | POA: Diagnosis not present

## 2024-03-02 DIAGNOSIS — D508 Other iron deficiency anemias: Secondary | ICD-10-CM | POA: Diagnosis not present

## 2024-03-02 DIAGNOSIS — Z9181 History of falling: Secondary | ICD-10-CM | POA: Diagnosis not present

## 2024-03-02 DIAGNOSIS — F02818 Dementia in other diseases classified elsewhere, unspecified severity, with other behavioral disturbance: Secondary | ICD-10-CM | POA: Diagnosis not present

## 2024-03-02 DIAGNOSIS — F05 Delirium due to known physiological condition: Secondary | ICD-10-CM | POA: Diagnosis not present

## 2024-03-02 DIAGNOSIS — E785 Hyperlipidemia, unspecified: Secondary | ICD-10-CM | POA: Diagnosis not present

## 2024-03-08 DIAGNOSIS — F413 Other mixed anxiety disorders: Secondary | ICD-10-CM | POA: Diagnosis not present

## 2024-03-10 DIAGNOSIS — E039 Hypothyroidism, unspecified: Secondary | ICD-10-CM | POA: Diagnosis not present

## 2024-03-10 DIAGNOSIS — N2889 Other specified disorders of kidney and ureter: Secondary | ICD-10-CM | POA: Diagnosis not present

## 2024-03-10 DIAGNOSIS — K219 Gastro-esophageal reflux disease without esophagitis: Secondary | ICD-10-CM | POA: Diagnosis not present

## 2024-03-10 DIAGNOSIS — F05 Delirium due to known physiological condition: Secondary | ICD-10-CM | POA: Diagnosis not present

## 2024-03-10 DIAGNOSIS — Z87440 Personal history of urinary (tract) infections: Secondary | ICD-10-CM | POA: Diagnosis not present

## 2024-03-10 DIAGNOSIS — M1990 Unspecified osteoarthritis, unspecified site: Secondary | ICD-10-CM | POA: Diagnosis not present

## 2024-03-10 DIAGNOSIS — K449 Diaphragmatic hernia without obstruction or gangrene: Secondary | ICD-10-CM | POA: Diagnosis not present

## 2024-03-10 DIAGNOSIS — Z9181 History of falling: Secondary | ICD-10-CM | POA: Diagnosis not present

## 2024-03-10 DIAGNOSIS — D699 Hemorrhagic condition, unspecified: Secondary | ICD-10-CM | POA: Diagnosis not present

## 2024-03-10 DIAGNOSIS — I2510 Atherosclerotic heart disease of native coronary artery without angina pectoris: Secondary | ICD-10-CM | POA: Diagnosis not present

## 2024-03-10 DIAGNOSIS — E785 Hyperlipidemia, unspecified: Secondary | ICD-10-CM | POA: Diagnosis not present

## 2024-03-10 DIAGNOSIS — G309 Alzheimer's disease, unspecified: Secondary | ICD-10-CM | POA: Diagnosis not present

## 2024-03-10 DIAGNOSIS — F02811 Dementia in other diseases classified elsewhere, unspecified severity, with agitation: Secondary | ICD-10-CM | POA: Diagnosis not present

## 2024-03-10 DIAGNOSIS — I10 Essential (primary) hypertension: Secondary | ICD-10-CM | POA: Diagnosis not present

## 2024-03-10 DIAGNOSIS — I252 Old myocardial infarction: Secondary | ICD-10-CM | POA: Diagnosis not present

## 2024-03-10 DIAGNOSIS — D509 Iron deficiency anemia, unspecified: Secondary | ICD-10-CM | POA: Diagnosis not present

## 2024-03-10 DIAGNOSIS — Z7982 Long term (current) use of aspirin: Secondary | ICD-10-CM | POA: Diagnosis not present

## 2024-03-10 DIAGNOSIS — F02818 Dementia in other diseases classified elsewhere, unspecified severity, with other behavioral disturbance: Secondary | ICD-10-CM | POA: Diagnosis not present

## 2024-03-15 DIAGNOSIS — K219 Gastro-esophageal reflux disease without esophagitis: Secondary | ICD-10-CM | POA: Diagnosis not present

## 2024-03-15 DIAGNOSIS — E785 Hyperlipidemia, unspecified: Secondary | ICD-10-CM | POA: Diagnosis not present

## 2024-03-15 DIAGNOSIS — F05 Delirium due to known physiological condition: Secondary | ICD-10-CM | POA: Diagnosis not present

## 2024-03-15 DIAGNOSIS — N2889 Other specified disorders of kidney and ureter: Secondary | ICD-10-CM | POA: Diagnosis not present

## 2024-03-15 DIAGNOSIS — F02811 Dementia in other diseases classified elsewhere, unspecified severity, with agitation: Secondary | ICD-10-CM | POA: Diagnosis not present

## 2024-03-15 DIAGNOSIS — M1990 Unspecified osteoarthritis, unspecified site: Secondary | ICD-10-CM | POA: Diagnosis not present

## 2024-03-15 DIAGNOSIS — E039 Hypothyroidism, unspecified: Secondary | ICD-10-CM | POA: Diagnosis not present

## 2024-03-15 DIAGNOSIS — Z9181 History of falling: Secondary | ICD-10-CM | POA: Diagnosis not present

## 2024-03-15 DIAGNOSIS — G309 Alzheimer's disease, unspecified: Secondary | ICD-10-CM | POA: Diagnosis not present

## 2024-03-15 DIAGNOSIS — K449 Diaphragmatic hernia without obstruction or gangrene: Secondary | ICD-10-CM | POA: Diagnosis not present

## 2024-03-15 DIAGNOSIS — D509 Iron deficiency anemia, unspecified: Secondary | ICD-10-CM | POA: Diagnosis not present

## 2024-03-15 DIAGNOSIS — Z87440 Personal history of urinary (tract) infections: Secondary | ICD-10-CM | POA: Diagnosis not present

## 2024-03-15 DIAGNOSIS — I252 Old myocardial infarction: Secondary | ICD-10-CM | POA: Diagnosis not present

## 2024-03-15 DIAGNOSIS — I2510 Atherosclerotic heart disease of native coronary artery without angina pectoris: Secondary | ICD-10-CM | POA: Diagnosis not present

## 2024-03-15 DIAGNOSIS — F02818 Dementia in other diseases classified elsewhere, unspecified severity, with other behavioral disturbance: Secondary | ICD-10-CM | POA: Diagnosis not present

## 2024-03-15 DIAGNOSIS — D699 Hemorrhagic condition, unspecified: Secondary | ICD-10-CM | POA: Diagnosis not present

## 2024-03-15 DIAGNOSIS — I10 Essential (primary) hypertension: Secondary | ICD-10-CM | POA: Diagnosis not present

## 2024-03-15 DIAGNOSIS — Z7982 Long term (current) use of aspirin: Secondary | ICD-10-CM | POA: Diagnosis not present

## 2024-03-16 DIAGNOSIS — N2889 Other specified disorders of kidney and ureter: Secondary | ICD-10-CM | POA: Diagnosis not present

## 2024-03-16 DIAGNOSIS — F02818 Dementia in other diseases classified elsewhere, unspecified severity, with other behavioral disturbance: Secondary | ICD-10-CM | POA: Diagnosis not present

## 2024-03-16 DIAGNOSIS — Z87440 Personal history of urinary (tract) infections: Secondary | ICD-10-CM | POA: Diagnosis not present

## 2024-03-16 DIAGNOSIS — I2510 Atherosclerotic heart disease of native coronary artery without angina pectoris: Secondary | ICD-10-CM | POA: Diagnosis not present

## 2024-03-16 DIAGNOSIS — G309 Alzheimer's disease, unspecified: Secondary | ICD-10-CM | POA: Diagnosis not present

## 2024-03-16 DIAGNOSIS — K219 Gastro-esophageal reflux disease without esophagitis: Secondary | ICD-10-CM | POA: Diagnosis not present

## 2024-03-16 DIAGNOSIS — I252 Old myocardial infarction: Secondary | ICD-10-CM | POA: Diagnosis not present

## 2024-03-16 DIAGNOSIS — E039 Hypothyroidism, unspecified: Secondary | ICD-10-CM | POA: Diagnosis not present

## 2024-03-16 DIAGNOSIS — F05 Delirium due to known physiological condition: Secondary | ICD-10-CM | POA: Diagnosis not present

## 2024-03-16 DIAGNOSIS — K449 Diaphragmatic hernia without obstruction or gangrene: Secondary | ICD-10-CM | POA: Diagnosis not present

## 2024-03-16 DIAGNOSIS — F02811 Dementia in other diseases classified elsewhere, unspecified severity, with agitation: Secondary | ICD-10-CM | POA: Diagnosis not present

## 2024-03-16 DIAGNOSIS — D509 Iron deficiency anemia, unspecified: Secondary | ICD-10-CM | POA: Diagnosis not present

## 2024-03-16 DIAGNOSIS — D699 Hemorrhagic condition, unspecified: Secondary | ICD-10-CM | POA: Diagnosis not present

## 2024-03-16 DIAGNOSIS — E038 Other specified hypothyroidism: Secondary | ICD-10-CM | POA: Diagnosis not present

## 2024-03-16 DIAGNOSIS — M1990 Unspecified osteoarthritis, unspecified site: Secondary | ICD-10-CM | POA: Diagnosis not present

## 2024-03-16 DIAGNOSIS — I10 Essential (primary) hypertension: Secondary | ICD-10-CM | POA: Diagnosis not present

## 2024-03-16 DIAGNOSIS — E785 Hyperlipidemia, unspecified: Secondary | ICD-10-CM | POA: Diagnosis not present

## 2024-03-16 DIAGNOSIS — Z7982 Long term (current) use of aspirin: Secondary | ICD-10-CM | POA: Diagnosis not present

## 2024-03-16 DIAGNOSIS — Z9181 History of falling: Secondary | ICD-10-CM | POA: Diagnosis not present

## 2024-03-21 DIAGNOSIS — G309 Alzheimer's disease, unspecified: Secondary | ICD-10-CM | POA: Diagnosis not present

## 2024-03-21 DIAGNOSIS — F05 Delirium due to known physiological condition: Secondary | ICD-10-CM | POA: Diagnosis not present

## 2024-03-21 DIAGNOSIS — F02818 Dementia in other diseases classified elsewhere, unspecified severity, with other behavioral disturbance: Secondary | ICD-10-CM | POA: Diagnosis not present

## 2024-03-21 DIAGNOSIS — K449 Diaphragmatic hernia without obstruction or gangrene: Secondary | ICD-10-CM | POA: Diagnosis not present

## 2024-03-21 DIAGNOSIS — K219 Gastro-esophageal reflux disease without esophagitis: Secondary | ICD-10-CM | POA: Diagnosis not present

## 2024-03-21 DIAGNOSIS — Z9181 History of falling: Secondary | ICD-10-CM | POA: Diagnosis not present

## 2024-03-21 DIAGNOSIS — Z7982 Long term (current) use of aspirin: Secondary | ICD-10-CM | POA: Diagnosis not present

## 2024-03-21 DIAGNOSIS — Z87440 Personal history of urinary (tract) infections: Secondary | ICD-10-CM | POA: Diagnosis not present

## 2024-03-21 DIAGNOSIS — D699 Hemorrhagic condition, unspecified: Secondary | ICD-10-CM | POA: Diagnosis not present

## 2024-03-21 DIAGNOSIS — M1990 Unspecified osteoarthritis, unspecified site: Secondary | ICD-10-CM | POA: Diagnosis not present

## 2024-03-21 DIAGNOSIS — I10 Essential (primary) hypertension: Secondary | ICD-10-CM | POA: Diagnosis not present

## 2024-03-21 DIAGNOSIS — I2510 Atherosclerotic heart disease of native coronary artery without angina pectoris: Secondary | ICD-10-CM | POA: Diagnosis not present

## 2024-03-21 DIAGNOSIS — I252 Old myocardial infarction: Secondary | ICD-10-CM | POA: Diagnosis not present

## 2024-03-21 DIAGNOSIS — N2889 Other specified disorders of kidney and ureter: Secondary | ICD-10-CM | POA: Diagnosis not present

## 2024-03-21 DIAGNOSIS — D509 Iron deficiency anemia, unspecified: Secondary | ICD-10-CM | POA: Diagnosis not present

## 2024-03-21 DIAGNOSIS — E785 Hyperlipidemia, unspecified: Secondary | ICD-10-CM | POA: Diagnosis not present

## 2024-03-21 DIAGNOSIS — E039 Hypothyroidism, unspecified: Secondary | ICD-10-CM | POA: Diagnosis not present

## 2024-03-21 DIAGNOSIS — F02811 Dementia in other diseases classified elsewhere, unspecified severity, with agitation: Secondary | ICD-10-CM | POA: Diagnosis not present

## 2024-03-22 DIAGNOSIS — G309 Alzheimer's disease, unspecified: Secondary | ICD-10-CM | POA: Diagnosis not present

## 2024-03-22 DIAGNOSIS — E039 Hypothyroidism, unspecified: Secondary | ICD-10-CM | POA: Diagnosis not present

## 2024-03-22 DIAGNOSIS — D509 Iron deficiency anemia, unspecified: Secondary | ICD-10-CM | POA: Diagnosis not present

## 2024-03-22 DIAGNOSIS — F02818 Dementia in other diseases classified elsewhere, unspecified severity, with other behavioral disturbance: Secondary | ICD-10-CM | POA: Diagnosis not present

## 2024-03-22 DIAGNOSIS — N2889 Other specified disorders of kidney and ureter: Secondary | ICD-10-CM | POA: Diagnosis not present

## 2024-03-22 DIAGNOSIS — I10 Essential (primary) hypertension: Secondary | ICD-10-CM | POA: Diagnosis not present

## 2024-03-22 DIAGNOSIS — Z9181 History of falling: Secondary | ICD-10-CM | POA: Diagnosis not present

## 2024-03-22 DIAGNOSIS — F05 Delirium due to known physiological condition: Secondary | ICD-10-CM | POA: Diagnosis not present

## 2024-03-22 DIAGNOSIS — K449 Diaphragmatic hernia without obstruction or gangrene: Secondary | ICD-10-CM | POA: Diagnosis not present

## 2024-03-22 DIAGNOSIS — I252 Old myocardial infarction: Secondary | ICD-10-CM | POA: Diagnosis not present

## 2024-03-22 DIAGNOSIS — F02811 Dementia in other diseases classified elsewhere, unspecified severity, with agitation: Secondary | ICD-10-CM | POA: Diagnosis not present

## 2024-03-22 DIAGNOSIS — D699 Hemorrhagic condition, unspecified: Secondary | ICD-10-CM | POA: Diagnosis not present

## 2024-03-22 DIAGNOSIS — I2510 Atherosclerotic heart disease of native coronary artery without angina pectoris: Secondary | ICD-10-CM | POA: Diagnosis not present

## 2024-03-22 DIAGNOSIS — K219 Gastro-esophageal reflux disease without esophagitis: Secondary | ICD-10-CM | POA: Diagnosis not present

## 2024-03-22 DIAGNOSIS — M1990 Unspecified osteoarthritis, unspecified site: Secondary | ICD-10-CM | POA: Diagnosis not present

## 2024-03-22 DIAGNOSIS — Z7982 Long term (current) use of aspirin: Secondary | ICD-10-CM | POA: Diagnosis not present

## 2024-03-22 DIAGNOSIS — Z87440 Personal history of urinary (tract) infections: Secondary | ICD-10-CM | POA: Diagnosis not present

## 2024-03-22 DIAGNOSIS — E785 Hyperlipidemia, unspecified: Secondary | ICD-10-CM | POA: Diagnosis not present

## 2024-03-23 DIAGNOSIS — D508 Other iron deficiency anemias: Secondary | ICD-10-CM | POA: Diagnosis not present

## 2024-03-23 DIAGNOSIS — I2510 Atherosclerotic heart disease of native coronary artery without angina pectoris: Secondary | ICD-10-CM | POA: Diagnosis not present

## 2024-03-23 DIAGNOSIS — I10 Essential (primary) hypertension: Secondary | ICD-10-CM | POA: Diagnosis not present

## 2024-03-23 DIAGNOSIS — E785 Hyperlipidemia, unspecified: Secondary | ICD-10-CM | POA: Diagnosis not present

## 2024-03-27 DIAGNOSIS — F05 Delirium due to known physiological condition: Secondary | ICD-10-CM | POA: Diagnosis not present

## 2024-03-27 DIAGNOSIS — F02811 Dementia in other diseases classified elsewhere, unspecified severity, with agitation: Secondary | ICD-10-CM | POA: Diagnosis not present

## 2024-03-27 DIAGNOSIS — Z9181 History of falling: Secondary | ICD-10-CM | POA: Diagnosis not present

## 2024-03-27 DIAGNOSIS — N2889 Other specified disorders of kidney and ureter: Secondary | ICD-10-CM | POA: Diagnosis not present

## 2024-03-27 DIAGNOSIS — F02818 Dementia in other diseases classified elsewhere, unspecified severity, with other behavioral disturbance: Secondary | ICD-10-CM | POA: Diagnosis not present

## 2024-03-27 DIAGNOSIS — K219 Gastro-esophageal reflux disease without esophagitis: Secondary | ICD-10-CM | POA: Diagnosis not present

## 2024-03-27 DIAGNOSIS — D509 Iron deficiency anemia, unspecified: Secondary | ICD-10-CM | POA: Diagnosis not present

## 2024-03-27 DIAGNOSIS — E039 Hypothyroidism, unspecified: Secondary | ICD-10-CM | POA: Diagnosis not present

## 2024-03-27 DIAGNOSIS — E785 Hyperlipidemia, unspecified: Secondary | ICD-10-CM | POA: Diagnosis not present

## 2024-03-27 DIAGNOSIS — Z7982 Long term (current) use of aspirin: Secondary | ICD-10-CM | POA: Diagnosis not present

## 2024-03-27 DIAGNOSIS — I2510 Atherosclerotic heart disease of native coronary artery without angina pectoris: Secondary | ICD-10-CM | POA: Diagnosis not present

## 2024-03-27 DIAGNOSIS — G309 Alzheimer's disease, unspecified: Secondary | ICD-10-CM | POA: Diagnosis not present

## 2024-03-27 DIAGNOSIS — M1990 Unspecified osteoarthritis, unspecified site: Secondary | ICD-10-CM | POA: Diagnosis not present

## 2024-03-27 DIAGNOSIS — D699 Hemorrhagic condition, unspecified: Secondary | ICD-10-CM | POA: Diagnosis not present

## 2024-03-27 DIAGNOSIS — I10 Essential (primary) hypertension: Secondary | ICD-10-CM | POA: Diagnosis not present

## 2024-03-27 DIAGNOSIS — I252 Old myocardial infarction: Secondary | ICD-10-CM | POA: Diagnosis not present

## 2024-03-27 DIAGNOSIS — Z87440 Personal history of urinary (tract) infections: Secondary | ICD-10-CM | POA: Diagnosis not present

## 2024-03-27 DIAGNOSIS — K449 Diaphragmatic hernia without obstruction or gangrene: Secondary | ICD-10-CM | POA: Diagnosis not present

## 2024-03-30 DIAGNOSIS — M1990 Unspecified osteoarthritis, unspecified site: Secondary | ICD-10-CM | POA: Diagnosis not present

## 2024-03-30 DIAGNOSIS — K219 Gastro-esophageal reflux disease without esophagitis: Secondary | ICD-10-CM | POA: Diagnosis not present

## 2024-03-30 DIAGNOSIS — Z7982 Long term (current) use of aspirin: Secondary | ICD-10-CM | POA: Diagnosis not present

## 2024-03-30 DIAGNOSIS — D699 Hemorrhagic condition, unspecified: Secondary | ICD-10-CM | POA: Diagnosis not present

## 2024-03-30 DIAGNOSIS — F02811 Dementia in other diseases classified elsewhere, unspecified severity, with agitation: Secondary | ICD-10-CM | POA: Diagnosis not present

## 2024-03-30 DIAGNOSIS — G309 Alzheimer's disease, unspecified: Secondary | ICD-10-CM | POA: Diagnosis not present

## 2024-03-30 DIAGNOSIS — Z87440 Personal history of urinary (tract) infections: Secondary | ICD-10-CM | POA: Diagnosis not present

## 2024-03-30 DIAGNOSIS — I10 Essential (primary) hypertension: Secondary | ICD-10-CM | POA: Diagnosis not present

## 2024-03-30 DIAGNOSIS — F05 Delirium due to known physiological condition: Secondary | ICD-10-CM | POA: Diagnosis not present

## 2024-03-30 DIAGNOSIS — Z9181 History of falling: Secondary | ICD-10-CM | POA: Diagnosis not present

## 2024-03-30 DIAGNOSIS — E785 Hyperlipidemia, unspecified: Secondary | ICD-10-CM | POA: Diagnosis not present

## 2024-03-30 DIAGNOSIS — I252 Old myocardial infarction: Secondary | ICD-10-CM | POA: Diagnosis not present

## 2024-03-30 DIAGNOSIS — N2889 Other specified disorders of kidney and ureter: Secondary | ICD-10-CM | POA: Diagnosis not present

## 2024-03-30 DIAGNOSIS — F02818 Dementia in other diseases classified elsewhere, unspecified severity, with other behavioral disturbance: Secondary | ICD-10-CM | POA: Diagnosis not present

## 2024-03-30 DIAGNOSIS — D509 Iron deficiency anemia, unspecified: Secondary | ICD-10-CM | POA: Diagnosis not present

## 2024-03-30 DIAGNOSIS — K449 Diaphragmatic hernia without obstruction or gangrene: Secondary | ICD-10-CM | POA: Diagnosis not present

## 2024-03-30 DIAGNOSIS — E039 Hypothyroidism, unspecified: Secondary | ICD-10-CM | POA: Diagnosis not present

## 2024-03-30 DIAGNOSIS — I2510 Atherosclerotic heart disease of native coronary artery without angina pectoris: Secondary | ICD-10-CM | POA: Diagnosis not present

## 2024-04-03 DIAGNOSIS — E039 Hypothyroidism, unspecified: Secondary | ICD-10-CM | POA: Diagnosis not present

## 2024-04-03 DIAGNOSIS — F02811 Dementia in other diseases classified elsewhere, unspecified severity, with agitation: Secondary | ICD-10-CM | POA: Diagnosis not present

## 2024-04-03 DIAGNOSIS — M1990 Unspecified osteoarthritis, unspecified site: Secondary | ICD-10-CM | POA: Diagnosis not present

## 2024-04-03 DIAGNOSIS — K219 Gastro-esophageal reflux disease without esophagitis: Secondary | ICD-10-CM | POA: Diagnosis not present

## 2024-04-03 DIAGNOSIS — I10 Essential (primary) hypertension: Secondary | ICD-10-CM | POA: Diagnosis not present

## 2024-04-03 DIAGNOSIS — F05 Delirium due to known physiological condition: Secondary | ICD-10-CM | POA: Diagnosis not present

## 2024-04-03 DIAGNOSIS — I2510 Atherosclerotic heart disease of native coronary artery without angina pectoris: Secondary | ICD-10-CM | POA: Diagnosis not present

## 2024-04-03 DIAGNOSIS — I252 Old myocardial infarction: Secondary | ICD-10-CM | POA: Diagnosis not present

## 2024-04-03 DIAGNOSIS — F02818 Dementia in other diseases classified elsewhere, unspecified severity, with other behavioral disturbance: Secondary | ICD-10-CM | POA: Diagnosis not present

## 2024-04-03 DIAGNOSIS — E785 Hyperlipidemia, unspecified: Secondary | ICD-10-CM | POA: Diagnosis not present

## 2024-04-03 DIAGNOSIS — D699 Hemorrhagic condition, unspecified: Secondary | ICD-10-CM | POA: Diagnosis not present

## 2024-04-03 DIAGNOSIS — Z9181 History of falling: Secondary | ICD-10-CM | POA: Diagnosis not present

## 2024-04-03 DIAGNOSIS — K449 Diaphragmatic hernia without obstruction or gangrene: Secondary | ICD-10-CM | POA: Diagnosis not present

## 2024-04-03 DIAGNOSIS — D509 Iron deficiency anemia, unspecified: Secondary | ICD-10-CM | POA: Diagnosis not present

## 2024-04-03 DIAGNOSIS — Z7982 Long term (current) use of aspirin: Secondary | ICD-10-CM | POA: Diagnosis not present

## 2024-04-03 DIAGNOSIS — Z87440 Personal history of urinary (tract) infections: Secondary | ICD-10-CM | POA: Diagnosis not present

## 2024-04-03 DIAGNOSIS — N2889 Other specified disorders of kidney and ureter: Secondary | ICD-10-CM | POA: Diagnosis not present

## 2024-04-03 DIAGNOSIS — G309 Alzheimer's disease, unspecified: Secondary | ICD-10-CM | POA: Diagnosis not present

## 2024-04-14 DIAGNOSIS — D508 Other iron deficiency anemias: Secondary | ICD-10-CM | POA: Diagnosis not present

## 2024-04-14 DIAGNOSIS — I10 Essential (primary) hypertension: Secondary | ICD-10-CM | POA: Diagnosis not present

## 2024-04-14 DIAGNOSIS — E038 Other specified hypothyroidism: Secondary | ICD-10-CM | POA: Diagnosis not present

## 2024-04-14 DIAGNOSIS — E785 Hyperlipidemia, unspecified: Secondary | ICD-10-CM | POA: Diagnosis not present

## 2024-06-01 ENCOUNTER — Emergency Department (HOSPITAL_COMMUNITY)
Admission: EM | Admit: 2024-06-01 | Discharge: 2024-06-02 | Disposition: A | Discharge status: Skilled Nursing Facility | Attending: Emergency Medicine | Admitting: Emergency Medicine

## 2024-06-01 ENCOUNTER — Emergency Department (HOSPITAL_COMMUNITY)

## 2024-06-01 ENCOUNTER — Other Ambulatory Visit: Payer: Self-pay

## 2024-06-01 DIAGNOSIS — F0390 Unspecified dementia without behavioral disturbance: Secondary | ICD-10-CM | POA: Diagnosis not present

## 2024-06-01 DIAGNOSIS — R519 Headache, unspecified: Secondary | ICD-10-CM | POA: Insufficient documentation

## 2024-06-01 DIAGNOSIS — H9203 Otalgia, bilateral: Secondary | ICD-10-CM | POA: Diagnosis present

## 2024-06-01 DIAGNOSIS — I2510 Atherosclerotic heart disease of native coronary artery without angina pectoris: Secondary | ICD-10-CM | POA: Diagnosis not present

## 2024-06-01 DIAGNOSIS — I10 Essential (primary) hypertension: Secondary | ICD-10-CM | POA: Diagnosis not present

## 2024-06-01 DIAGNOSIS — N390 Urinary tract infection, site not specified: Secondary | ICD-10-CM | POA: Insufficient documentation

## 2024-06-01 DIAGNOSIS — M542 Cervicalgia: Secondary | ICD-10-CM | POA: Diagnosis not present

## 2024-06-01 LAB — CBC WITH DIFFERENTIAL/PLATELET
Abs Immature Granulocytes: 0.03 K/uL (ref 0.00–0.07)
Basophils Absolute: 0.1 K/uL (ref 0.0–0.1)
Basophils Relative: 1 %
Eosinophils Absolute: 0 K/uL (ref 0.0–0.5)
Eosinophils Relative: 0 %
HCT: 40.9 % (ref 36.0–46.0)
Hemoglobin: 12.9 g/dL (ref 12.0–15.0)
Immature Granulocytes: 0 %
Lymphocytes Relative: 22 %
Lymphs Abs: 2.2 K/uL (ref 0.7–4.0)
MCH: 26.6 pg (ref 26.0–34.0)
MCHC: 31.5 g/dL (ref 30.0–36.0)
MCV: 84.3 fL (ref 80.0–100.0)
Monocytes Absolute: 1 K/uL (ref 0.1–1.0)
Monocytes Relative: 10 %
Neutro Abs: 6.8 K/uL (ref 1.7–7.7)
Neutrophils Relative %: 67 %
Platelets: 245 K/uL (ref 150–400)
RBC: 4.85 MIL/uL (ref 3.87–5.11)
RDW: 13.9 % (ref 11.5–15.5)
WBC: 10.2 K/uL (ref 4.0–10.5)
nRBC: 0 % (ref 0.0–0.2)

## 2024-06-01 LAB — URINALYSIS, ROUTINE W REFLEX MICROSCOPIC
Bacteria, UA: NONE SEEN
Bilirubin Urine: NEGATIVE
Glucose, UA: NEGATIVE mg/dL
Ketones, ur: 5 mg/dL — AB
Nitrite: NEGATIVE
Protein, ur: NEGATIVE mg/dL
Specific Gravity, Urine: 1.013 (ref 1.005–1.030)
pH: 7 (ref 5.0–8.0)

## 2024-06-01 LAB — BASIC METABOLIC PANEL WITH GFR
Anion gap: 12 (ref 5–15)
BUN: 14 mg/dL (ref 8–23)
CO2: 27 mmol/L (ref 22–32)
Calcium: 9.3 mg/dL (ref 8.9–10.3)
Chloride: 100 mmol/L (ref 98–111)
Creatinine, Ser: 0.62 mg/dL (ref 0.44–1.00)
GFR, Estimated: >60 mL/min
Glucose, Bld: 128 mg/dL — ABNORMAL HIGH (ref 70–99)
Potassium: 3.5 mmol/L (ref 3.5–5.1)
Sodium: 139 mmol/L (ref 135–145)

## 2024-06-01 MED ORDER — LORAZEPAM 2 MG/ML IJ SOLN
1.0000 mg | Freq: Once | INTRAMUSCULAR | Status: AC
  Administered 2024-06-01: 1 mg via INTRAVENOUS
  Filled 2024-06-01: qty 1

## 2024-06-01 MED ORDER — HYDROMORPHONE HCL 1 MG/ML IJ SOLN
1.0000 mg | Freq: Once | INTRAMUSCULAR | Status: DC
  Filled 2024-06-01: qty 1

## 2024-06-01 MED ORDER — HYDROMORPHONE HCL 1 MG/ML IJ SOLN
0.5000 mg | Freq: Once | INTRAMUSCULAR | Status: AC
  Administered 2024-06-01: 0.5 mg via INTRAVENOUS
  Filled 2024-06-01: qty 1

## 2024-06-01 MED ORDER — IOHEXOL 350 MG/ML SOLN
75.0000 mL | Freq: Once | INTRAVENOUS | Status: AC | PRN
  Administered 2024-06-01: 75 mL via INTRAVENOUS

## 2024-06-01 MED ORDER — ACETAMINOPHEN 500 MG PO TABS
1000.0000 mg | ORAL_TABLET | Freq: Once | ORAL | Status: AC
  Administered 2024-06-01: 1000 mg via ORAL
  Filled 2024-06-01: qty 2

## 2024-06-01 MED ORDER — ONDANSETRON HCL 4 MG/2ML IJ SOLN
4.0000 mg | Freq: Once | INTRAMUSCULAR | Status: AC
  Administered 2024-06-01: 4 mg via INTRAVENOUS
  Filled 2024-06-01: qty 2

## 2024-06-01 MED ORDER — GABAPENTIN 100 MG PO CAPS
100.0000 mg | ORAL_CAPSULE | Freq: Once | ORAL | Status: AC
  Administered 2024-06-01: 100 mg via ORAL
  Filled 2024-06-01: qty 1

## 2024-06-01 NOTE — ED Notes (Signed)
 Received report that pt was agitated and unable to lie still for CTA. Provider aware and Ativan  ordered.

## 2024-06-01 NOTE — ED Provider Notes (Incomplete)
 " [place] EMERGENCY DEPARTMENT AT [facility] Provider Note   CSN: [id number] Arrival date & time: 06/01/24  1749     Patient presents with: Otalgia   Caitlyn [name] is a 79 y.o. female.  {Add pertinent medical, surgical, social history, OB history to YEP:67052} The history is provided by the patient, the nursing home and medical records. No language interpreter was used.  Otalgia    79 year old female with history of hypertension, thyroid  disease, CAD, anemia, CAD, dementia brought here via EMS from memory care unit with complaint of ear pain.  Patient complaining of pain to both side of her head/ear radiates down to her neck and described as a flashing sensation that she mention it started sometime yesterday and have been persistent.  She does not complain of any hearing loss or ringing in her ears.  She does endorse having some headache but denies any nausea vomiting fever chills numbness or weakness.  She does have history of dementia and therefore history may not be reliable.  She is not on any blood thinner medication.  Prior to Admission medications  Medication Sig Start Date End Date Taking? Authorizing Provider  amLODipine  (NORVASC ) 5 MG tablet Take 1 tablet (5 mg total) by mouth daily. 01/03/24 02/02/24  Cindy Garnette POUR, MD  carvedilol  (COREG ) 6.25 MG tablet Take 1 tablet (6.25 mg total) by mouth 2 (two) times daily with a meal. 01/03/24   Cindy Garnette POUR, MD  QUEtiapine  (SEROQUEL ) 25 MG tablet Take 0.5 tablets (12.5 mg total) by mouth at bedtime. 01/03/24 02/02/24  Cindy Garnette POUR, MD  senna-docusate (SENOKOT-S) 8.6-50 MG tablet Take 1 tablet by mouth at bedtime as needed for mild constipation. 01/03/24   Cindy Garnette POUR, MD    Allergies: Cortone acetate [cortisone], Crestor  [rosuvastatin  calcium ], Demerol [meperidine], Monosodium glutamate, Morphine and codeine, Prednisone, Shellfish allergy, Sulfa antibiotics, Hctz [hydrochlorothiazide ], Readi-cat [barium sulfate],  Xanax [alprazolam], Ceftin [cefuroxime], Flagyl [metronidazole], Macrodantin [nitrofurantoin], Praluent  [alirocumab ], Repatha  [evolocumab ], Wellbutrin [bupropion], Benzoic acid, Butrans [buprenorphine], Cipro [ciprofloxacin hcl], and Influenza vaccines    Review of Systems  Unable to perform ROS: Dementia  HENT:  Positive for ear pain.     Updated Vital Signs BP (!) 153/126 (BP Location: Left Arm)   Pulse 89   Temp 98.3 F (36.8 C) (Axillary)   Resp 16   SpO2 98%   Physical Exam Vitals and nursing note reviewed.  Constitutional:      General: She is not in acute distress.    Appearance: She is well-developed.     Comments: Patient appears uncomfortable but nontoxic  HENT:     Head: Atraumatic.     Right Ear: There is impacted cerumen.     Left Ear: There is impacted cerumen.     Ears:     Comments: Cerumen impaction to both ear canals unable to visualize TM Eyes:     Extraocular Movements: Extraocular movements intact.     Conjunctiva/sclera: Conjunctivae normal.     Pupils: Pupils are equal, round, and reactive to light.  Cardiovascular:     Rate and Rhythm: Normal rate and regular rhythm.  Pulmonary:     Effort: Pulmonary effort is normal.  Musculoskeletal:        General: Normal range of motion.     Cervical back: Normal range of motion and neck supple. Tenderness (Diffuse tenderness to gentle palpation of bilateral neck without any pulsatile mass or overlying skin changes.) present.  Skin:    Capillary Refill: Capillary  refill takes less than 2 seconds.     Findings: No rash.  Neurological:     Mental Status: She is alert. Mental status is at baseline.  Psychiatric:        Mood and Affect: Mood normal.     (all labs ordered are listed, but only abnormal results are displayed) Labs Reviewed  BASIC METABOLIC PANEL WITH GFR - Abnormal; Notable for the following components:      Result Value   Glucose, Bld 128 (*)    All other components within normal limits  CBC  WITH DIFFERENTIAL/PLATELET  URINALYSIS, ROUTINE W REFLEX MICROSCOPIC    EKG: None  Radiology: No results found.  {Document cardiac monitor, telemetry assessment procedure when appropriate:32947} Procedures   Medications Ordered in the ED - No data to display    {Click here for ABCD2, HEART and other calculators REFRESH Note before signing:1}                              Medical Decision Making Amount and/or Complexity of Data Reviewed Labs: ordered. Radiology: ordered.  Risk OTC drugs. Prescription drug management.   BP (!) 153/126 (BP Location: Left Arm)   Pulse 89   Temp 98.3 F (36.8 C) (Axillary)   Resp 16   SpO2 98%   31:9 PM 79 year old female with history of hypertension, thyroid  disease, CAD, anemia, CAD, dementia brought here via EMS from memory care unit with complaint of ear pain.  Patient complaining of pain to both side of her head/ear radiates down to her neck and described as a flashing sensation that she mention it started sometime yesterday and have been persistent.  She does not complain of any hearing loss or ringing in her ears.  She does endorse having some headache but denies any nausea vomiting fever chills numbness or weakness.  She does have history of dementia and therefore history may not be reliable.  She is not on any blood thinner medication.  On exam, patient is holding her head appears uncomfortable.  She has reproducible tenderness about her neck bilaterally without any pulsatile mass overlying skin changes.  I am unable to visualize a TMs due to impacted cerumen but have a low suspicion for impacted cerumen causing her pain and discomfort.  Work up initiated.   [employer] ordered, independently viewed and interpreted by me.  Labs remarkable for urinalysis with cloudy urine and large leukocyte esterase as well as 21-50 WBC concerning for UTI.  Normal WBC, electrolyte panels are reassuring.  Since patient complained of bilateral ear pain and also has  signs of UTI, will treat for suspected infection with Augmentin -The patient was maintained on a cardiac monitor.  I personally viewed and interpreted the cardiac monitored which showed an underlying rhythm of: Sinus rhythm -Imaging independently viewed and interpreted by me and I agree with radiologist's interpretation.  Result remarkable for CT angiogram of head and neck without any acute intracranial abnormality and no large vessel occlusion.  Patient does have severe left P2 PCA stenosis without any occlusion or aneurysm.  This is not consistent with patient presentation -This patient presents to the ED for concern of otalgia, this involves an extensive number of treatment options, and is a complaint that carries with it a high risk of complications and morbidity.  The differential diagnosis includes otitis media, otitis externa, vertebral artery dissection, hemorrhagic stroke, cellulitis, trauma -Co morbidities that complicate the patient evaluation includes hypertension, thyroid   disease, CAD, dementia -Treatment includes Ativan , Dilaudid , Zofran , Tylenol , gabapentin  -Reevaluation of the patient after these medicines showed that the patient improved -PCP office notes or outside notes reviewed -Discussion with attending Dr. Mannie.  Attempted to call family member without success -Escalation to admission/observation considered: patient to be discharge -Prescription medication considered, patient comfortable with augmentin -Social Determinant of Health considered    {Document critical care time when appropriate  Document review of labs and clinical decision tools ie CHADS2VASC2, etc  Document your independent review of radiology images and any outside records  Document your discussion with family members, caretakers and with consultants  Document social determinants of health affecting pt's care  Document your decision making why or why not admission, treatments were needed:32947:::1}    Final diagnoses:  None    ED Discharge Orders     None        "

## 2024-06-01 NOTE — ED Notes (Signed)
 Spoke with pt's sister, [name], in [place]  inquiring about pt.

## 2024-06-01 NOTE — ED Provider Notes (Signed)
 " [place] [facility] AT [facility] Provider Note   CSN: [id number] Arrival date & time: 06/01/24  1749     Patient presents with: Otalgia   Caitlyn [name] is a 79 y.o. female.  {Add pertinent medical, surgical, social history, OB history to YEP:67052} The history is provided by the patient, the nursing home and medical records. No language interpreter was used.  Otalgia    79 year old female with history of hypertension, thyroid  disease, CAD, anemia, CAD, dementia brought here via EMS from memory care unit with complaint of ear pain.  Patient complaining of pain to both side of her head/ear radiates down to her neck and described as a flashing sensation that she mention it started sometime yesterday and have been persistent.  She does not complain of any hearing loss or ringing in her ears.  She does endorse having some headache but denies any nausea vomiting fever chills numbness or weakness.  She does have history of dementia and therefore history may not be reliable.  She is not on any blood thinner medication.  Prior to Admission medications  Medication Sig Start Date End Date Taking? Authorizing Provider  amLODipine  (NORVASC ) 5 MG tablet Take 1 tablet (5 mg total) by mouth daily. 01/03/24 02/02/24  Cindy Garnette POUR, MD  carvedilol  (COREG ) 6.25 MG tablet Take 1 tablet (6.25 mg total) by mouth 2 (two) times daily with a meal. 01/03/24   Cindy Garnette POUR, MD  QUEtiapine  (SEROQUEL ) 25 MG tablet Take 0.5 tablets (12.5 mg total) by mouth at bedtime. 01/03/24 02/02/24  Cindy Garnette POUR, MD  senna-docusate (SENOKOT-S) 8.6-50 MG tablet Take 1 tablet by mouth at bedtime as needed for mild constipation. 01/03/24   Cindy Garnette POUR, MD    Allergies: Cortone acetate [cortisone], Crestor  [rosuvastatin  calcium ], Demerol [meperidine], Monosodium glutamate, Morphine and codeine, Prednisone, Shellfish allergy, Sulfa antibiotics, Hctz [hydrochlorothiazide ], Readi-cat [barium sulfate],  Xanax [alprazolam], Ceftin [cefuroxime], Flagyl [metronidazole], Macrodantin [nitrofurantoin], Praluent  [alirocumab ], Repatha  [evolocumab ], Wellbutrin [bupropion], Benzoic acid, Butrans [buprenorphine], Cipro [ciprofloxacin hcl], and Influenza vaccines    Review of Systems  Unable to perform ROS: Dementia  HENT:  Positive for ear pain.     Updated Vital Signs BP (!) 153/126 (BP Location: Left Arm)   Pulse 89   Temp 98.3 F (36.8 C) (Axillary)   Resp 16   SpO2 98%   Physical Exam Vitals and nursing note reviewed.  Constitutional:      General: She is not in acute distress.    Appearance: She is well-developed.     Comments: Patient appears uncomfortable but nontoxic  HENT:     Head: Atraumatic.     Right Ear: There is impacted cerumen.     Left Ear: There is impacted cerumen.     Ears:     Comments: Cerumen impaction to both ear canals unable to visualize TM Eyes:     Extraocular Movements: Extraocular movements intact.     Conjunctiva/sclera: Conjunctivae normal.     Pupils: Pupils are equal, round, and reactive to light.  Cardiovascular:     Rate and Rhythm: Normal rate and regular rhythm.  Pulmonary:     Effort: Pulmonary effort is normal.  Musculoskeletal:        General: Normal range of motion.     Cervical back: Normal range of motion and neck supple. Tenderness (Diffuse tenderness to gentle palpation of bilateral neck without any pulsatile mass or overlying skin changes.) present.  Skin:    Capillary Refill: Capillary  refill takes less than 2 seconds.     Findings: No rash.  Neurological:     Mental Status: She is alert. Mental status is at baseline.  Psychiatric:        Mood and Affect: Mood normal.     (all labs ordered are listed, but only abnormal results are displayed) Labs Reviewed - No data to display  EKG: None  Radiology: No results found.  {Document cardiac monitor, telemetry assessment procedure when appropriate:32947} Procedures    Medications Ordered in the ED - No data to display    {Click here for ABCD2, HEART and other calculators REFRESH Note before signing:1}                              Medical Decision Making  BP (!) 153/126 (BP Location: Left Arm)   Pulse 89   Temp 98.3 F (36.8 C) (Axillary)   Resp 16   SpO2 98%   31:68 PM 79 year old female with history of hypertension, thyroid  disease, CAD, anemia, CAD, dementia brought here via EMS from memory care unit with complaint of ear pain.  Patient complaining of pain to both side of her head/ear radiates down to her neck and described as a flashing sensation that she mention it started sometime yesterday and have been persistent.  She does not complain of any hearing loss or ringing in her ears.  She does endorse having some headache but denies any nausea vomiting fever chills numbness or weakness.  She does have history of dementia and therefore history may not be reliable.  She is not on any blood thinner medication.  On exam, patient is holding her head appears uncomfortable.  She has reproducible tenderness about her neck bilaterally without any pulsatile mass overlying skin changes.  I am unable to visualize a TMs due to impacted cerumen but have a low suspicion for impacted cerumen causing her pain and discomfort.  Work up initiated.   [employer] ordered, independently viewed and interpreted by me.  Labs remarkable for *** -The patient was maintained on a cardiac monitor.  I personally viewed and interpreted the cardiac monitored which showed an underlying rhythm of: *** -Imaging independently viewed and interpreted by me and I agree with radiologist's interpretation.  Result remarkable for *** -This patient presents to the ED for concern of ***, this involves an extensive number of treatment options, and is a complaint that carries with it a high risk of complications and morbidity.  The differential diagnosis includes *** -Co morbidities that complicate the  patient evaluation includes *** -Treatment includes *** -Reevaluation of the patient after these medicines showed that the patient {resolved/improved/worsened:23923::improved} -PCP office notes or outside notes reviewed -Discussion with specialist *** -Escalation to admission/observation considered: patients feels much better, is comfortable with discharge, and will follow up with PCP -Prescription medication considered, patient comfortable with *** -Social Determinant of Health considered which includes ***   {Document critical care time when appropriate  Document review of labs and clinical decision tools ie CHADS2VASC2, etc  Document your independent review of radiology images and any outside records  Document your discussion with family members, caretakers and with consultants  Document social determinants of health affecting pt's care  Document your decision making why or why not admission, treatments were needed:32947:::1}   Final diagnoses:  None    ED Discharge Orders     None        "

## 2024-06-01 NOTE — ED Triage Notes (Signed)
 BIB EMS from memory care, [facility].  Complaining of bilateral ear pain x 24 hours. No fevers, no cough.  Can turn her head.  Going over bumps and loud noises cause her ears to hurt.

## 2024-06-02 MED ORDER — GABAPENTIN 100 MG PO CAPS: 100.0000 mg | ORAL_CAPSULE | Freq: Three times a day (TID) | ORAL | 0 refills | Status: AC

## 2024-06-02 MED ORDER — AMOXICILLIN-POT CLAVULANATE 875-125 MG PO TABS: 1.0000 | ORAL_TABLET | Freq: Two times a day (BID) | ORAL | 0 refills | Status: AC

## 2024-06-02 NOTE — ED Notes (Signed)
 [facility] called

## 2024-06-02 NOTE — ED Notes (Signed)
 Also attempted to call [facility] at 2nd number [phone] to no avail.

## 2024-06-02 NOTE — ED Notes (Signed)
 Attempted to call [facility] Unit [phone]

## 2024-06-02 NOTE — ED Notes (Signed)
 Left a message on voicemail at [facility] requesting a call back for discharge report.

## 2024-06-02 NOTE — ED Notes (Signed)
 Attempted to call [facility] for report for third time. No answer.

## 2024-06-02 NOTE — Discharge Instructions (Signed)
 You have been evaluated for your ear aches.  Fortunately CT scan of your head and neck did not show any concerning finding.  You do have evidence of a urinary tract infection.  Your earaches may be due to a infection.  I have prescribed antibiotics as treatment.  Take it as prescribed.  Take gabapentin  for your pain and follow-up closely with your doctor for further care.
